# Patient Record
Sex: Female | Born: 1980 | Race: Black or African American | Hispanic: No | Smoking: Current every day smoker
Health system: Southern US, Community
[De-identification: ages and names within clinical notes are randomized; demographics above are authoritative.]

## PROBLEM LIST (undated history)

## (undated) DIAGNOSIS — Z5189 Encounter for other specified aftercare: Secondary | ICD-10-CM

## (undated) DIAGNOSIS — N840 Polyp of corpus uteri: Secondary | ICD-10-CM

## (undated) DIAGNOSIS — D649 Anemia, unspecified: Secondary | ICD-10-CM

## (undated) DIAGNOSIS — K648 Other hemorrhoids: Secondary | ICD-10-CM

## (undated) DIAGNOSIS — R87629 Unspecified abnormal cytological findings in specimens from vagina: Secondary | ICD-10-CM

## (undated) DIAGNOSIS — F329 Major depressive disorder, single episode, unspecified: Secondary | ICD-10-CM

## (undated) DIAGNOSIS — D219 Benign neoplasm of connective and other soft tissue, unspecified: Secondary | ICD-10-CM

## (undated) DIAGNOSIS — N7011 Chronic salpingitis: Secondary | ICD-10-CM

## (undated) DIAGNOSIS — H9319 Tinnitus, unspecified ear: Secondary | ICD-10-CM

## (undated) DIAGNOSIS — B009 Herpesviral infection, unspecified: Secondary | ICD-10-CM

## (undated) DIAGNOSIS — N189 Chronic kidney disease, unspecified: Secondary | ICD-10-CM

## (undated) DIAGNOSIS — N920 Excessive and frequent menstruation with regular cycle: Secondary | ICD-10-CM

## (undated) DIAGNOSIS — R102 Pelvic and perineal pain: Secondary | ICD-10-CM

## (undated) DIAGNOSIS — F32A Depression, unspecified: Secondary | ICD-10-CM

## (undated) DIAGNOSIS — I708 Atherosclerosis of other arteries: Secondary | ICD-10-CM

## (undated) DIAGNOSIS — Z8742 Personal history of other diseases of the female genital tract: Secondary | ICD-10-CM

## (undated) DIAGNOSIS — I7090 Unspecified atherosclerosis: Secondary | ICD-10-CM

## (undated) DIAGNOSIS — R1030 Lower abdominal pain, unspecified: Secondary | ICD-10-CM

## (undated) DIAGNOSIS — N946 Dysmenorrhea, unspecified: Secondary | ICD-10-CM

## (undated) DIAGNOSIS — R195 Other fecal abnormalities: Secondary | ICD-10-CM

## (undated) DIAGNOSIS — R5383 Other fatigue: Secondary | ICD-10-CM

## (undated) DIAGNOSIS — F419 Anxiety disorder, unspecified: Secondary | ICD-10-CM

## (undated) DIAGNOSIS — R7303 Prediabetes: Secondary | ICD-10-CM

## (undated) DIAGNOSIS — K625 Hemorrhage of anus and rectum: Secondary | ICD-10-CM

## (undated) HISTORY — DX: Pelvic and perineal pain: R10.2

## (undated) HISTORY — DX: Excessive and frequent menstruation with regular cycle: N92.0

## (undated) HISTORY — DX: Personal history of other diseases of the female genital tract: Z87.42

## (undated) HISTORY — DX: Herpesviral infection, unspecified: B00.9

## (undated) HISTORY — DX: Major depressive disorder, single episode, unspecified: F32.9

## (undated) HISTORY — DX: Other fecal abnormalities: R19.5

## (undated) HISTORY — DX: Polyp of corpus uteri: N84.0

## (undated) HISTORY — DX: Chronic salpingitis: N70.11

## (undated) HISTORY — DX: Benign neoplasm of connective and other soft tissue, unspecified: D21.9

## (undated) HISTORY — DX: Hemorrhage of anus and rectum: K62.5

## (undated) HISTORY — DX: Unspecified abnormal cytological findings in specimens from vagina: R87.629

## (undated) HISTORY — DX: Lower abdominal pain, unspecified: R10.30

## (undated) HISTORY — DX: Dysmenorrhea, unspecified: N94.6

## (undated) HISTORY — DX: Depression, unspecified: F32.A

## (undated) HISTORY — DX: Other fatigue: R53.83

## (undated) HISTORY — DX: Anxiety disorder, unspecified: F41.9

## (undated) HISTORY — DX: Anemia, unspecified: D64.9

---

## 2003-02-07 HISTORY — PX: ECTOPIC PREGNANCY SURGERY: SHX613

## 2010-11-07 DIAGNOSIS — L280 Lichen simplex chronicus: Secondary | ICD-10-CM

## 2010-11-07 HISTORY — DX: Lichen simplex chronicus: L28.0

## 2011-02-08 DIAGNOSIS — M722 Plantar fascial fibromatosis: Secondary | ICD-10-CM

## 2011-02-08 DIAGNOSIS — L84 Corns and callosities: Secondary | ICD-10-CM | POA: Insufficient documentation

## 2011-02-08 HISTORY — DX: Plantar fascial fibromatosis: M72.2

## 2011-02-08 HISTORY — DX: Corns and callosities: L84

## 2011-03-31 DIAGNOSIS — Z8719 Personal history of other diseases of the digestive system: Secondary | ICD-10-CM | POA: Insufficient documentation

## 2011-03-31 DIAGNOSIS — F1010 Alcohol abuse, uncomplicated: Secondary | ICD-10-CM | POA: Insufficient documentation

## 2011-03-31 HISTORY — DX: Personal history of other diseases of the digestive system: Z87.19

## 2011-05-15 ENCOUNTER — Emergency Department (HOSPITAL_COMMUNITY): Payer: Self-pay

## 2011-05-15 ENCOUNTER — Encounter (HOSPITAL_COMMUNITY): Payer: Self-pay | Admitting: *Deleted

## 2011-05-15 ENCOUNTER — Emergency Department (HOSPITAL_COMMUNITY)
Admission: EM | Admit: 2011-05-15 | Discharge: 2011-05-15 | Disposition: A | Discharge status: Home / Self Care | Payer: Self-pay | Attending: Emergency Medicine | Admitting: Emergency Medicine

## 2011-05-15 DIAGNOSIS — M545 Low back pain, unspecified: Secondary | ICD-10-CM | POA: Insufficient documentation

## 2011-05-15 DIAGNOSIS — R109 Unspecified abdominal pain: Secondary | ICD-10-CM | POA: Insufficient documentation

## 2011-05-15 DIAGNOSIS — F17200 Nicotine dependence, unspecified, uncomplicated: Secondary | ICD-10-CM | POA: Insufficient documentation

## 2011-05-15 DIAGNOSIS — N949 Unspecified condition associated with female genital organs and menstrual cycle: Secondary | ICD-10-CM | POA: Insufficient documentation

## 2011-05-15 DIAGNOSIS — J3489 Other specified disorders of nose and nasal sinuses: Secondary | ICD-10-CM | POA: Insufficient documentation

## 2011-05-15 DIAGNOSIS — M549 Dorsalgia, unspecified: Secondary | ICD-10-CM

## 2011-05-15 DIAGNOSIS — Z79899 Other long term (current) drug therapy: Secondary | ICD-10-CM | POA: Insufficient documentation

## 2011-05-15 DIAGNOSIS — H9209 Otalgia, unspecified ear: Secondary | ICD-10-CM | POA: Insufficient documentation

## 2011-05-15 DIAGNOSIS — R51 Headache: Secondary | ICD-10-CM | POA: Insufficient documentation

## 2011-05-15 DIAGNOSIS — K921 Melena: Secondary | ICD-10-CM | POA: Insufficient documentation

## 2011-05-15 DIAGNOSIS — J338 Other polyp of sinus: Secondary | ICD-10-CM | POA: Insufficient documentation

## 2011-05-15 DIAGNOSIS — K625 Hemorrhage of anus and rectum: Secondary | ICD-10-CM | POA: Insufficient documentation

## 2011-05-15 LAB — BASIC METABOLIC PANEL
BUN: 12 mg/dL (ref 6–23)
CO2: 23 mEq/L (ref 19–32)
Calcium: 9.4 mg/dL (ref 8.4–10.5)
Chloride: 102 mEq/L (ref 96–112)
Creatinine, Ser: 0.65 mg/dL (ref 0.50–1.10)
GFR calc Af Amer: >90 mL/min (ref >90)
GFR calc non Af Amer: >90 mL/min (ref >90)
Glucose, Bld: 115 mg/dL — ABNORMAL HIGH (ref 70–99)
Potassium: 4 mEq/L (ref 3.5–5.1)
Sodium: 135 mEq/L (ref 135–145)

## 2011-05-15 LAB — CBC
HCT: 39.9 % (ref 36.0–46.0)
MCH: 28 pg (ref 26.0–34.0)
MCV: 83.3 fL (ref 78.0–100.0)
Platelets: 301 10*3/uL (ref 150–400)
RDW: 13.1 % (ref 11.5–15.5)

## 2011-05-15 LAB — DIFFERENTIAL
Basophils Absolute: 0 10*3/uL (ref 0.0–0.1)
Eosinophils Absolute: 0.3 10*3/uL (ref 0.0–0.7)
Eosinophils Relative: 5 % (ref 0–5)
Monocytes Absolute: 0.6 10*3/uL (ref 0.1–1.0)

## 2011-05-15 LAB — URINALYSIS, ROUTINE W REFLEX MICROSCOPIC
Bilirubin Urine: NEGATIVE
Hgb urine dipstick: NEGATIVE
Ketones, ur: NEGATIVE mg/dL
Nitrite: NEGATIVE
Protein, ur: NEGATIVE mg/dL
Urobilinogen, UA: 0.2 mg/dL (ref 0.0–1.0)

## 2011-05-15 IMAGING — CT CT ABD-PELV W/ CM
2 of 4 series · 16 of 46 positions shown, 18 images · IV contrast (Omnipaque 300)
Comparison: None.

CLINICAL DATA: Felt pop in right flank, with right flank pain.
Intermittent worsening rectal bleeding.  History of chronic lower
back and bilateral hip pain.

CT ABDOMEN AND PELVIS WITH CONTRAST
TECHNIQUE: Multidetector CT imaging of the abdomen and pelvis was
performed following the standard protocol during bolus
administration of intravenous contrast.
Contrast: 100mL OMNIPAQUE IOHEXOL 300 MG/ML  SOLN

[Series 2: abd_pel_with 5.0 b40f · axial · 0.79mm/px · z∈[-479,-79]mm · 13 of 88 slices shown, 15 images]
[im 4/88  soft-tissue]
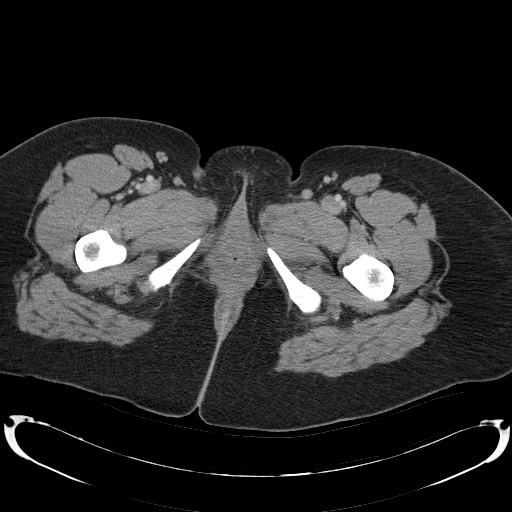
[im 4/88  bone]
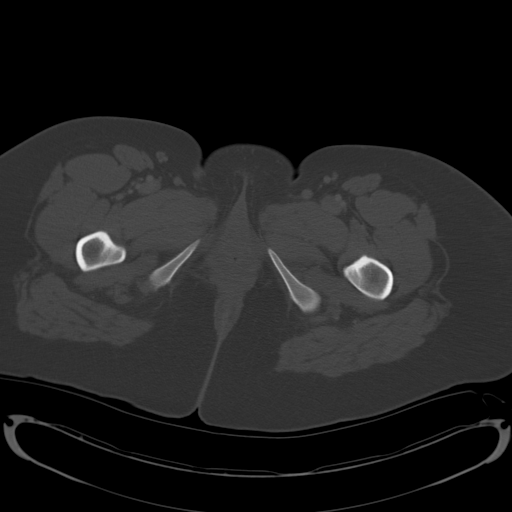
[im 11/88  soft-tissue]
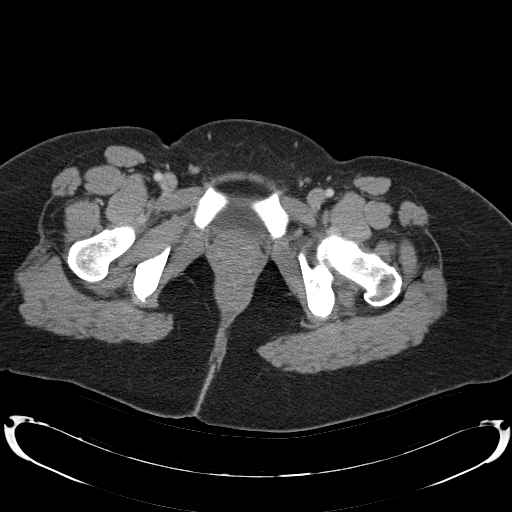
[im 19/88  soft-tissue]
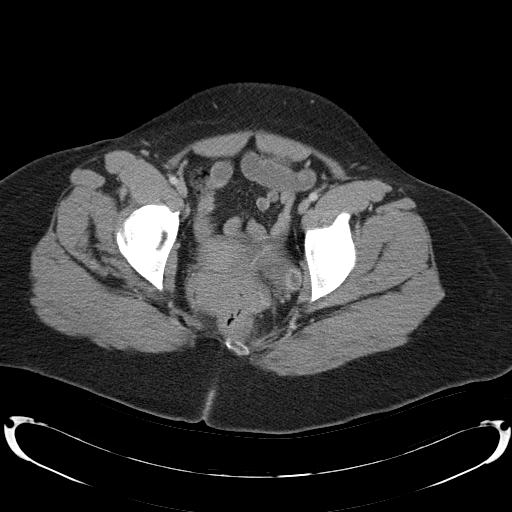
[im 26/88  soft-tissue]
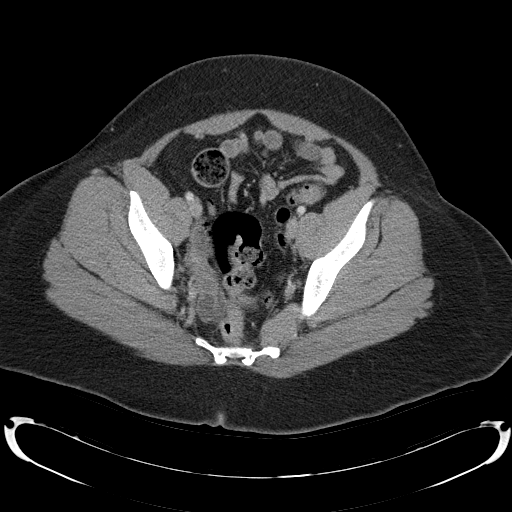
[im 30/88  soft-tissue]
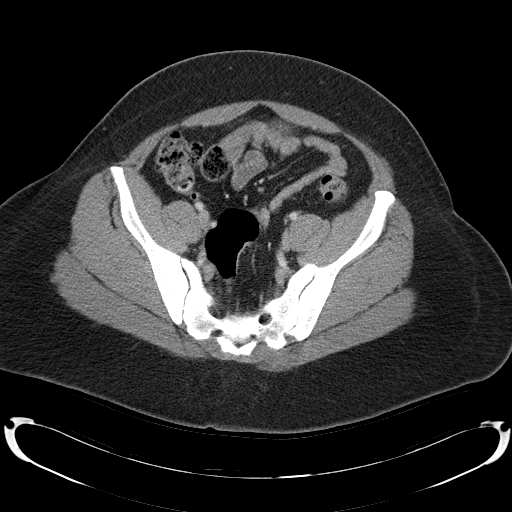
[im 37/88  soft-tissue]
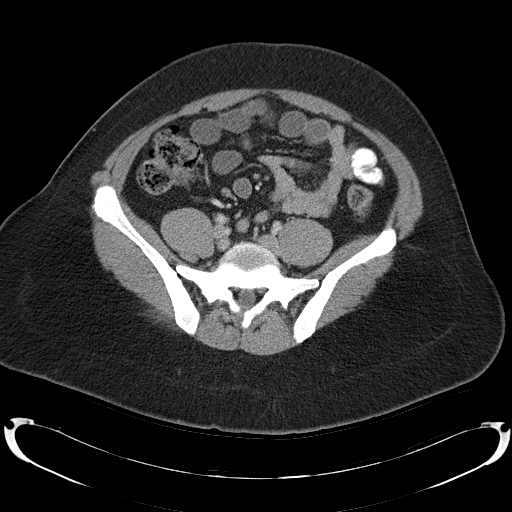
[im 44/88  soft-tissue]
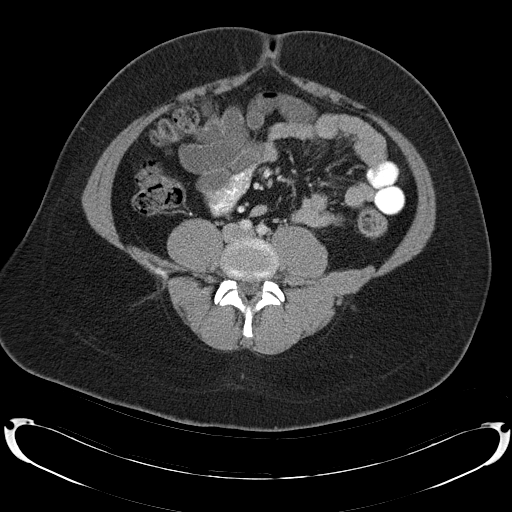
[im 51/88  soft-tissue]
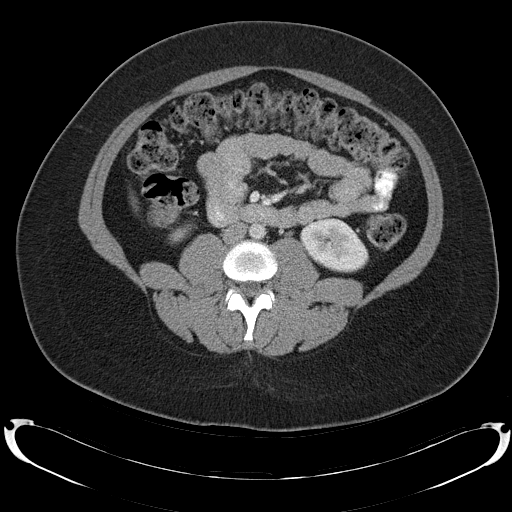
[im 59/88  soft-tissue]
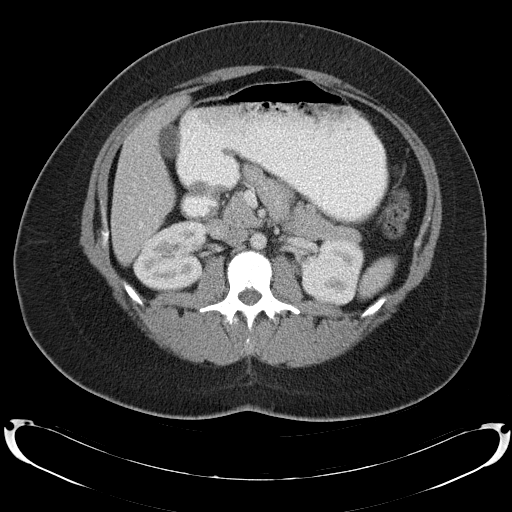
[im 59/88  bone]
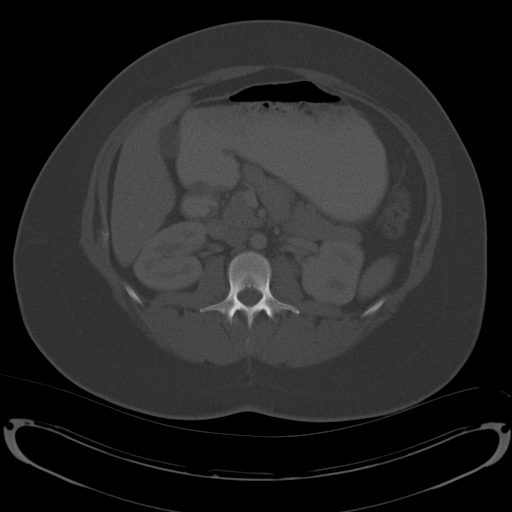
[im 62/88  soft-tissue]
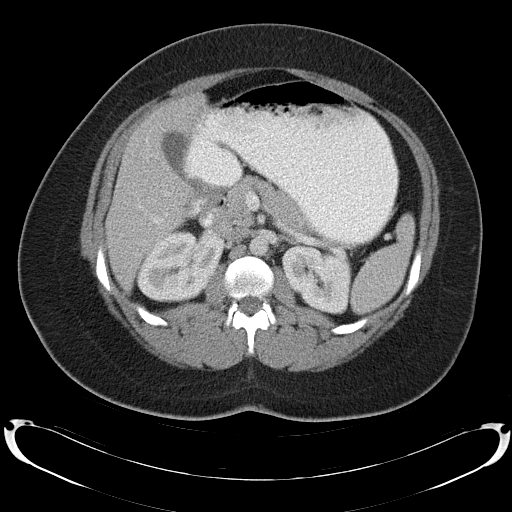
[im 69/88  soft-tissue]
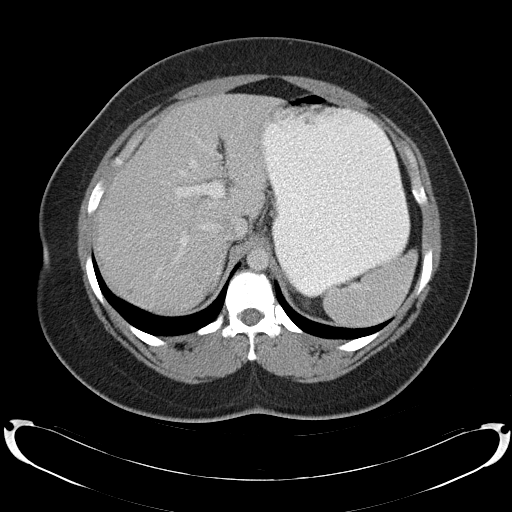
[im 77/88  soft-tissue]
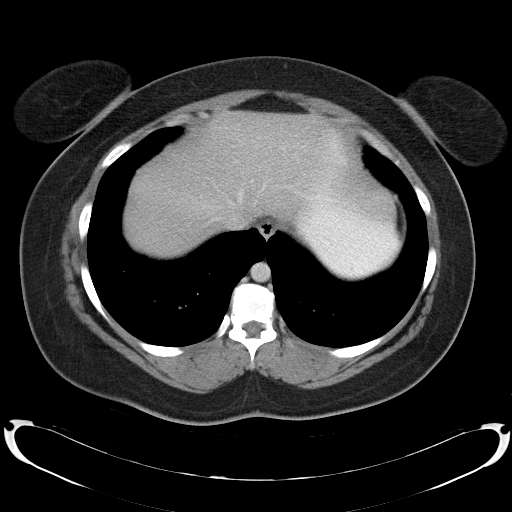
[im 84/88  soft-tissue]
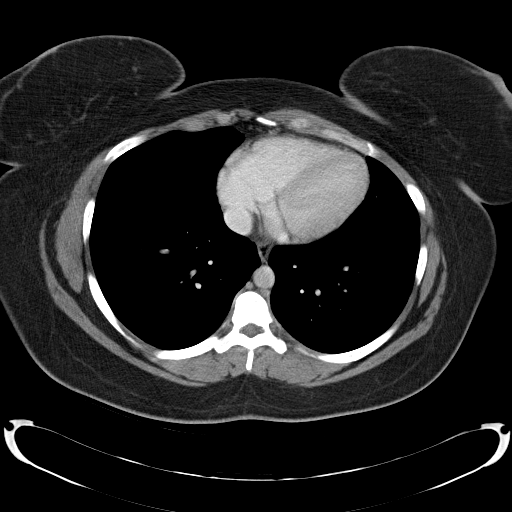

[Series 4: abd_pel_with 3.0 spo cor · coronal · 0.71mm/px · 3 of 92 slices shown]
[im 31/92  soft-tissue]
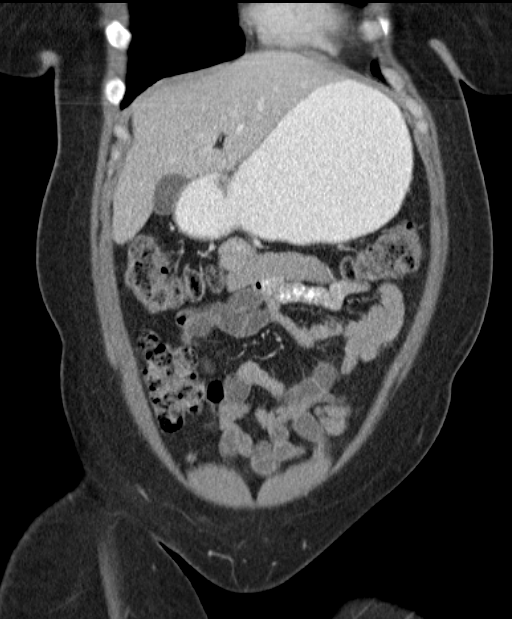
[im 41/92  soft-tissue]
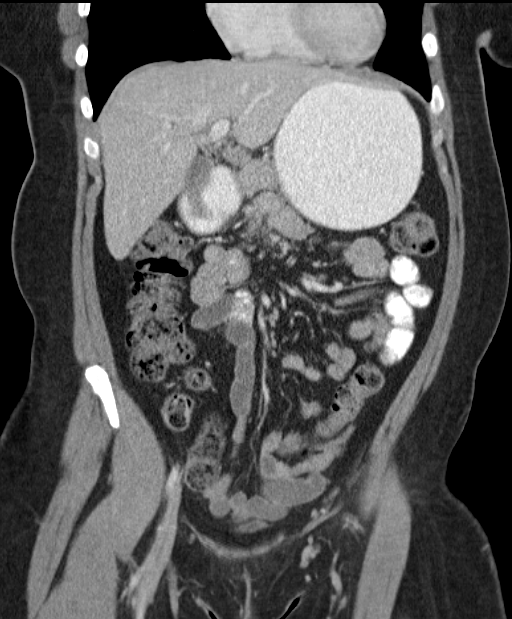
[im 51/92  soft-tissue]
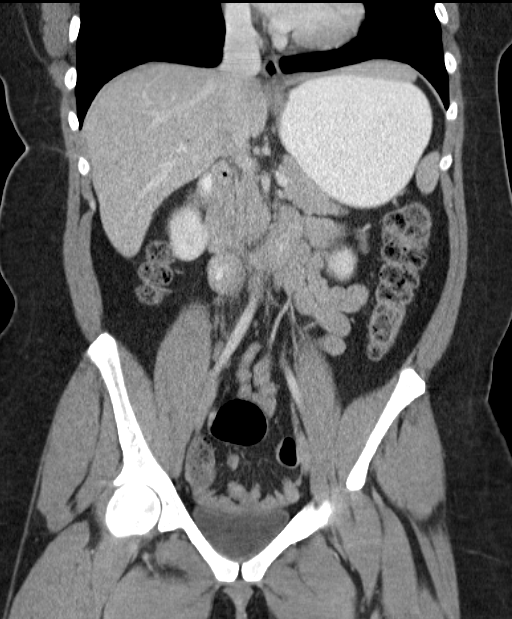

[16 of 46 positions shown; findings below may reference images not displayed]

FINDINGS: The visualized lung bases are clear.

The liver and spleen are unremarkable in appearance.  The
gallbladder is within normal limits.  The pancreas and adrenal
glands are unremarkable.

The kidneys are unremarkable in appearance.  There is no evidence
of hydronephrosis.  No renal or ureteral stones are seen.  No
perinephric stranding is appreciated.

No free fluid is identified.  The small bowel is unremarkable in
appearance.  The stomach is filled with contrast and is within
normal limits.  No acute vascular abnormalities are seen.

The appendix is normal in caliber and contains air, without
evidence for appendicitis.  The colon is partially filled with
stool and is unremarkable in appearance.

The bladder is mildly distended and grossly unremarkable in
appearance.  The uterus is grossly unremarkable in appearance; the
ovaries are relatively symmetric.  No suspicious adnexal masses are
seen.  No inguinal lymphadenopathy is seen.

No acute osseous abnormalities are identified.
IMPRESSION: 1.  Unremarkable CT of the abdomen and pelvis.
2.  The hip joints are grossly unremarkable in appearance; no
evidence of fracture or subluxation along the lumbar spine.  No
significant disc protrusions identified; the bony foramina appear
grossly intact bilaterally.  Note that soft tissue abnormalities,
such as labral pathology, are not well assessed on CT.

## 2011-05-15 IMAGING — CT CT HEAD W/O CM
1 series · 15 of 30 positions shown, 19 images · non-contrast
Comparison: None.

CLINICAL DATA: Sinus and head congestion.

CT HEAD WITHOUT CONTRAST
TECHNIQUE: Contiguous axial images were obtained from the base of
the skull through the vertex without contrast.

[Series 2: headseq 4.8 h37s · axial · 0.44mm/px · z∈[+173,+308]mm · 15 of 30 slices shown, 19 images]
[im 2/30  brain]
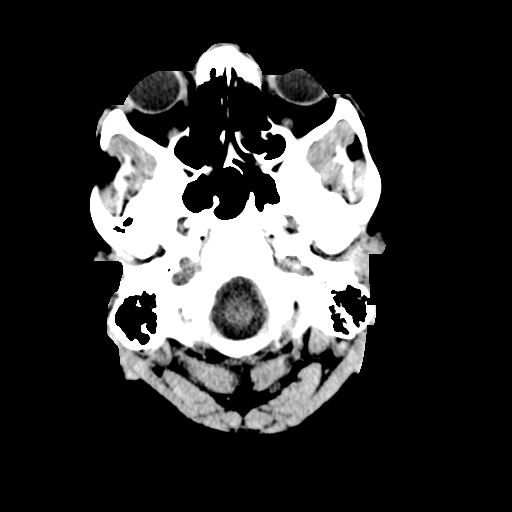
[im 2/30  bone]
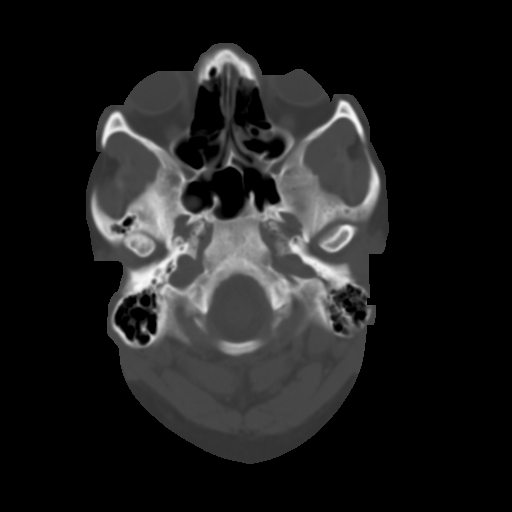
[im 4/30  brain]
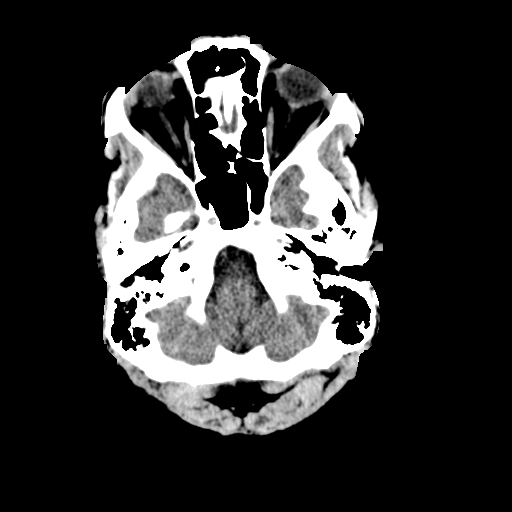
[im 6/30  brain]
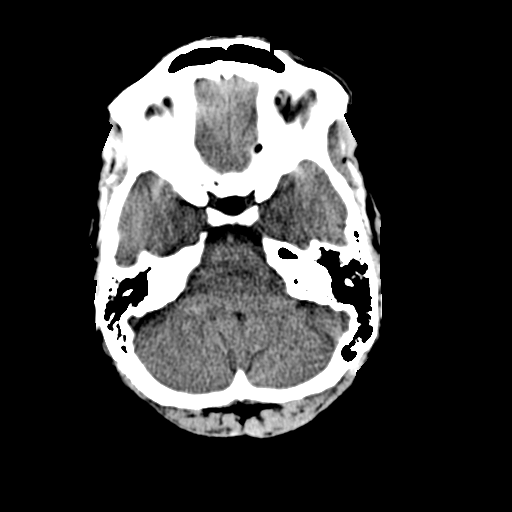
[im 8/30  brain]
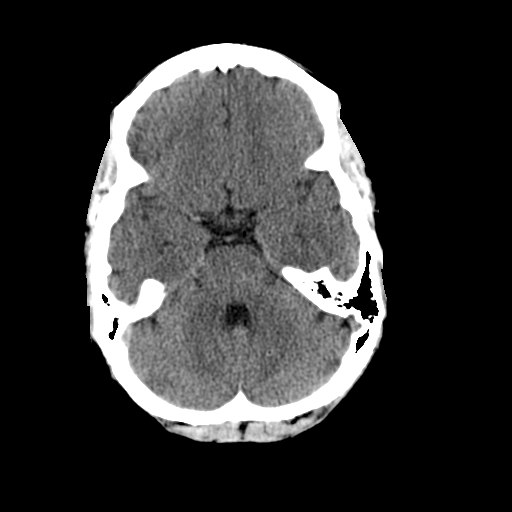
[im 10/30  brain]
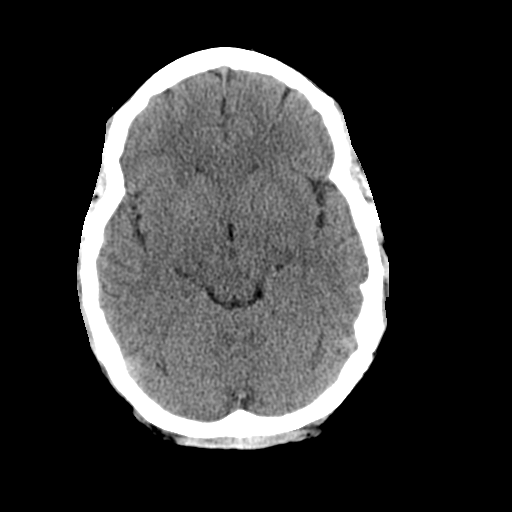
[im 10/30  bone]
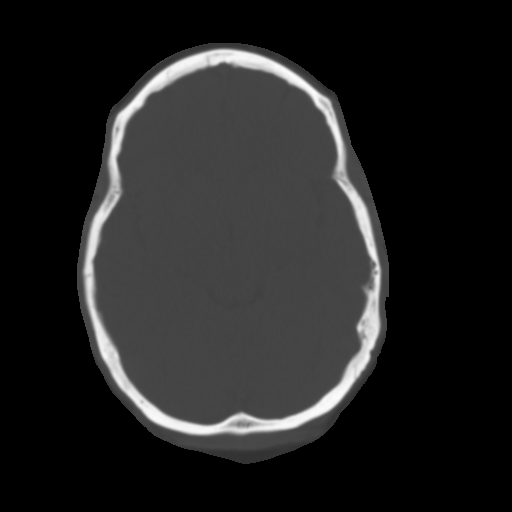
[im 12/30  brain]
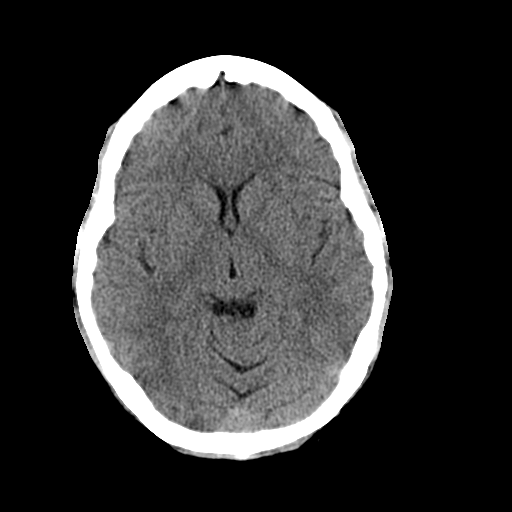
[im 14/30  brain]
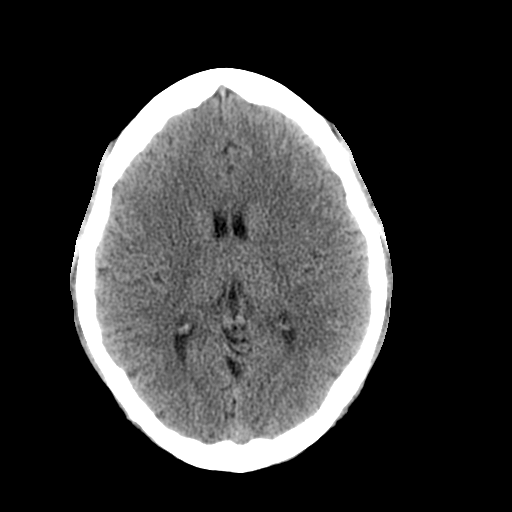
[im 16/30  brain]
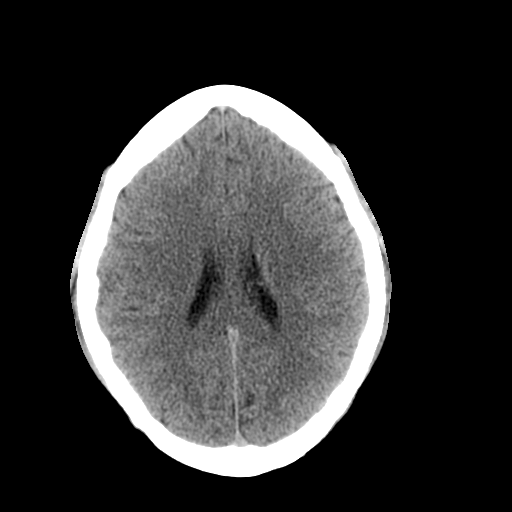
[im 17/30  brain]
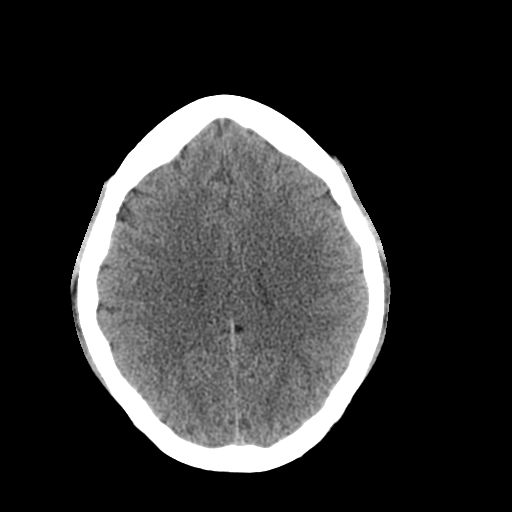
[im 17/30  bone]
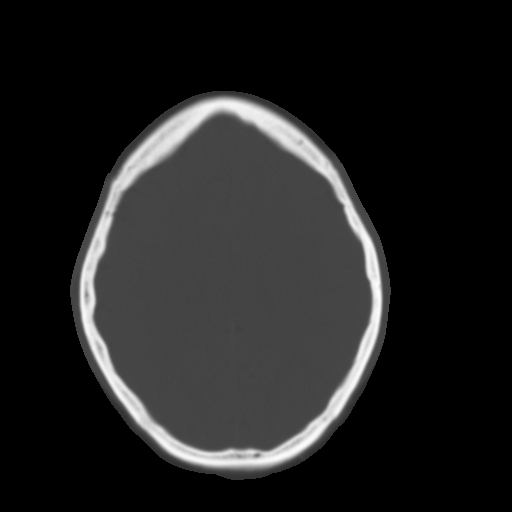
[im 19/30  brain]
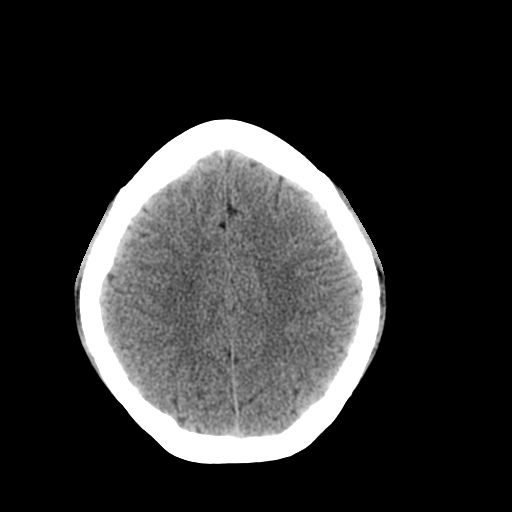
[im 21/30  brain]
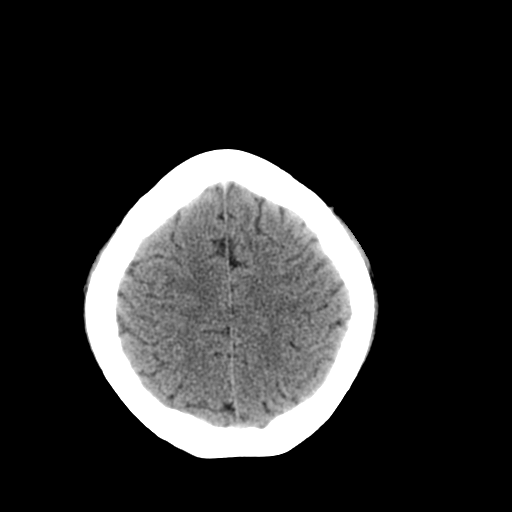
[im 23/30  brain]
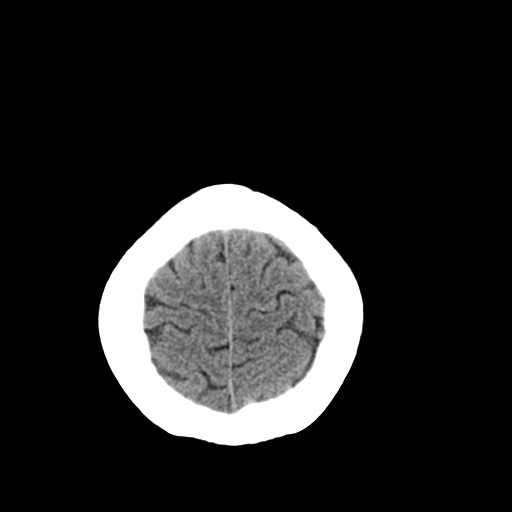
[im 25/30  brain]
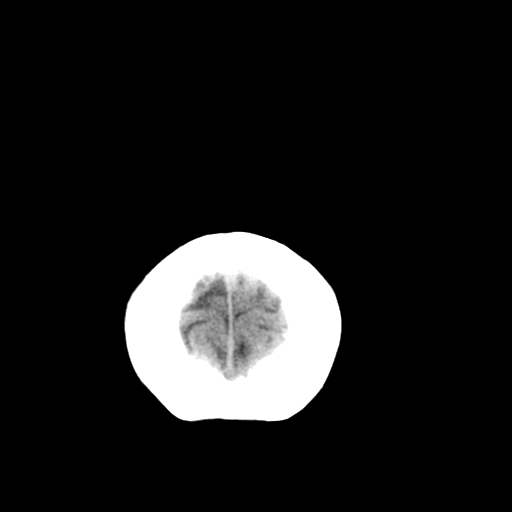
[im 25/30  bone]
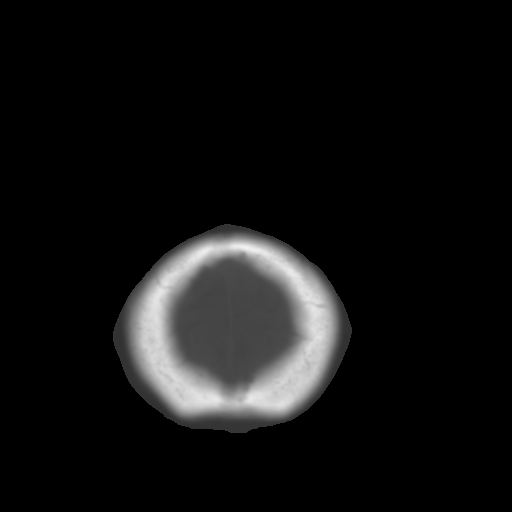
[im 27/30  brain]
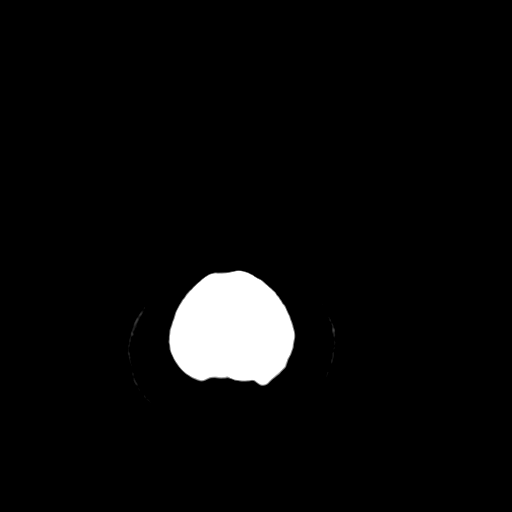
[im 29/30  brain]
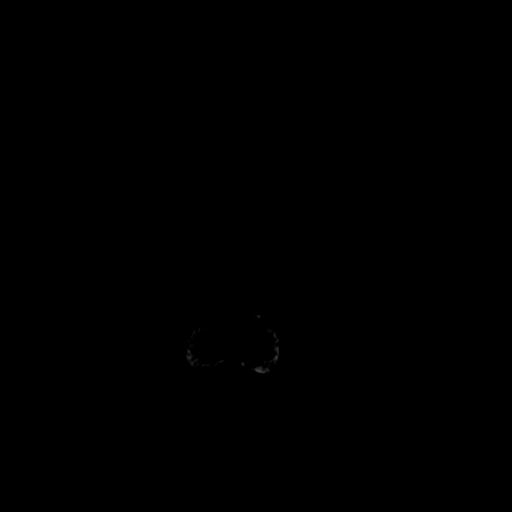

[15 of 30 positions shown; findings below may reference images not displayed]

FINDINGS: There is no evidence of acute infarction, mass lesion, or
intra- or extra-axial hemorrhage on CT.

The posterior fossa, including the cerebellum, brainstem and fourth
ventricle, is within normal limits.  The third and lateral
ventricles, and basal ganglia are unremarkable in appearance.  The
cerebral hemispheres are symmetric in appearance, with normal gray-
white differentiation.  No mass effect or midline shift is seen.

There is no evidence of fracture; visualized osseous structures are
unremarkable in appearance.  The visualized portions of the orbits
are within normal limits.  Two small polyps or mucus retention
cysts are noted within the right side of the sphenoid sinus; the
remaining paranasal sinuses and mastoid air cells are well-aerated.
No significant soft tissue abnormalities are seen.
IMPRESSION: 1.  No acute intracranial pathology seen on CT.
2.  Two small polyps or mucus retention cysts within the right side
of the sphenoid sinus; paranasal sinuses and mastoid air cells
otherwise well-aerated.

## 2011-05-15 MED ORDER — HYDROCODONE-ACETAMINOPHEN 5-325 MG PO TABS
1.0000 | ORAL_TABLET | Freq: Once | ORAL | Status: AC
  Administered 2011-05-15: 1 via ORAL
  Filled 2011-05-15: qty 1

## 2011-05-15 MED ORDER — HYDROCODONE-ACETAMINOPHEN 5-325 MG PO TABS
1.0000 | ORAL_TABLET | Freq: Four times a day (QID) | ORAL | Status: AC | PRN
Start: 2011-05-15 — End: 2011-05-25

## 2011-05-15 MED ORDER — ONDANSETRON HCL 4 MG/2ML IJ SOLN
4.0000 mg | Freq: Once | INTRAMUSCULAR | Status: AC
  Administered 2011-05-15: 4 mg via INTRAVENOUS
  Filled 2011-05-15: qty 2

## 2011-05-15 MED ORDER — SODIUM CHLORIDE 0.9 % IV BOLUS (SEPSIS)
250.0000 mL | Freq: Once | INTRAVENOUS | Status: AC
  Administered 2011-05-15: 1000 mL via INTRAVENOUS

## 2011-05-15 MED ORDER — NAPROXEN 500 MG PO TABS: 500.0000 mg | ORAL_TABLET | Freq: Two times a day (BID) | ORAL | Status: DC

## 2011-05-15 MED ORDER — IOHEXOL 300 MG/ML  SOLN
100.0000 mL | Freq: Once | INTRAMUSCULAR | Status: AC | PRN
  Administered 2011-05-15: 100 mL via INTRAVENOUS

## 2011-05-15 MED ORDER — HYDROMORPHONE HCL PF 1 MG/ML IJ SOLN
1.0000 mg | Freq: Once | INTRAMUSCULAR | Status: AC
  Administered 2011-05-15: 1 mg via INTRAVENOUS
  Filled 2011-05-15: qty 1

## 2011-05-15 MED ORDER — SODIUM CHLORIDE 0.9 % IV SOLN
INTRAVENOUS | Status: DC
  Administered 2011-05-15: 21:00:00 via INTRAVENOUS

## 2011-05-15 NOTE — ED Provider Notes (Signed)
History     CSN: [id number]  Arrival date & time 05/15/11  1442   First MD Initiated Contact with Patient 05/15/11 1941      Chief Complaint  Patient presents with  . Flank Pain    (Consider location/radiation/quality/duration/timing/severity/associated sxs/prior treatment) The history is provided by the patient.   patient is a 31 year old female recently moved to the area presenting for a popping feeling in her right flank low back pain pelvic pain or low back pain in the pelvic pain is been present for several weeks to months followed by GYN in [facility] was supposed to have an operation for a scarred fallopian tube Dr. pelvic wall. However she is now moved here and they told her they would do that anymore not clear why that is the way it is. Patient also with a complaint of feeling of congestion worried about having a sinus infection some fullness in her left ear. And history of rectal bleeding on and off bright red blood with bowel movements for several months worse in the last few months. Patient denies any chest pain dizziness lightheadedness nausea vomiting or diarrhea or rash. Patient has followup with health department here [facility] later this week.  History reviewed. No pertinent past medical history.  Past Surgical History  Procedure Date  . Ectopic pregnancy surgery     History reviewed. No pertinent family history.  History  Substance Use Topics  . Smoking status: Current Everyday Smoker  . Smokeless tobacco: Not on file  . Alcohol Use: Yes    OB History    Grav Para Term Preterm Abortions TAB SAB Ect Mult Living                  Review of Systems  Constitutional: Negative for fever, chills and fatigue.  HENT: Positive for ear pain, congestion and sinus pressure. Negative for nosebleeds, sore throat, neck pain, tinnitus and ear discharge.   Respiratory: Negative for cough, chest tightness and shortness of breath.   Cardiovascular: Negative for  chest pain and leg swelling.  Gastrointestinal: Positive for abdominal pain, blood in stool and anal bleeding. Negative for nausea, vomiting, diarrhea and rectal pain.  Genitourinary: Negative for dysuria and vaginal discharge.  Musculoskeletal: Positive for back pain. Negative for myalgias.  Skin: Negative for rash.  Neurological: Positive for headaches. Negative for dizziness, syncope, facial asymmetry, speech difficulty and weakness.  Hematological: Does not bruise/bleed easily.    Allergies  Review of patient's allergies indicates no known allergies.  Home Medications   Current Outpatient Rx  Name Route Sig Dispense Refill  . IBUPROFEN 200 MG PO TABS Oral Take 400 mg by mouth once as needed. For pain    . VALACYCLOVIR HCL 1 G PO TABS Oral Take 1,000 mg by mouth daily.    Marland Kitchen HYDROCODONE-ACETAMINOPHEN 5-325 MG PO TABS Oral Take 1-2 tablets by mouth every 6 (six) hours as needed for pain. 10 tablet 0  . NAPROXEN 500 MG PO TABS Oral Take 1 tablet (500 mg total) by mouth 2 (two) times daily. 14 tablet 0    BP 107/51  Pulse 64  Temp(Src) 98.6 F (37 C) (Oral)  Resp 20  Ht 5\' 4"  (1.626 m)  Wt 200 lb (90.719 kg)  BMI 34.33 kg/m2  SpO2 100%  LMP 05/01/2011  Physical Exam  Nursing note and vitals reviewed. Constitutional: She is oriented to person, place, and time. She appears well-developed and well-nourished. No distress.  HENT:  Head: Normocephalic and atraumatic.  Right Ear: External ear normal.  Left Ear: External ear normal.  Mouth/Throat: Oropharynx is clear and moist. No oropharyngeal exudate.  Eyes: Conjunctivae and EOM are normal. Pupils are equal, round, and reactive to light.  Neck: Normal range of motion. Neck supple.  Cardiovascular: Normal rate, regular rhythm, normal heart sounds and intact distal pulses.   No murmur heard. Pulmonary/Chest: Effort normal and breath sounds normal. She has no wheezes. She has no rales. She exhibits no tenderness.  Abdominal: Soft.  She exhibits no mass. There is no tenderness.  Musculoskeletal: Normal range of motion. She exhibits no edema and no tenderness.       Bilateral lower extremity pulses dorsalis pedis 3+ or greater. Posterior tib 2+. Motor strength in the feet normal sensory intact. No pain with range of motion of the hip area.  Lymphadenopathy:    She has no cervical adenopathy.  Neurological: She is alert and oriented to person, place, and time. No cranial nerve deficit. She exhibits normal muscle tone. Coordination normal.  Skin: No rash noted.    ED Course  Procedures (including critical care time)  Labs Reviewed  URINALYSIS, ROUTINE W REFLEX MICROSCOPIC - Abnormal; Notable for the following:    Specific Gravity, Urine >1.030 (*)    All other components within normal limits  DIFFERENTIAL - Abnormal; Notable for the following:    Neutrophils Relative 37 (*)    Lymphocytes Relative 49 (*)    All other components within normal limits  BASIC METABOLIC PANEL - Abnormal; Notable for the following:    Glucose, Bld 115 (*)    All other components within normal limits  PREGNANCY, URINE  CBC   Ct Head Wo Contrast  05/15/2011  *RADIOLOGY REPORT*  Clinical Data: Sinus and head congestion.  CT HEAD WITHOUT CONTRAST  Technique:  Contiguous axial images were obtained from the base of the skull through the vertex without contrast.  Comparison: None.  Findings: There is no evidence of acute infarction, mass lesion, or intra- or extra-axial hemorrhage on CT.  The posterior fossa, including the cerebellum, brainstem and fourth ventricle, is within normal limits.  The third and lateral ventricles, and basal ganglia are unremarkable in appearance.  The cerebral hemispheres are symmetric in appearance, with normal gray- white differentiation.  No mass effect or midline shift is seen.  There is no evidence of fracture; visualized osseous structures are unremarkable in appearance.  The visualized portions of the orbits are  within normal limits.  Two small polyps or mucus retention cysts are noted within the right side of the sphenoid sinus; the remaining paranasal sinuses and mastoid air cells are well-aerated. No significant soft tissue abnormalities are seen.  IMPRESSION:  1.  No acute intracranial pathology seen on CT. 2.  Two small polyps or mucus retention cysts within the right side of the sphenoid sinus; paranasal sinuses and mastoid air cells otherwise well-aerated.  Original Report Authenticated By: Tonia Ghent, M.D.   Ct Abdomen Pelvis W Contrast  05/15/2011  *RADIOLOGY REPORT*  Clinical Data: Felt pop in right flank, with right flank pain. Intermittent worsening rectal bleeding.  History of chronic lower back and bilateral hip pain.  CT ABDOMEN AND PELVIS WITH CONTRAST  Technique:  Multidetector CT imaging of the abdomen and pelvis was performed following the standard protocol during bolus administration of intravenous contrast.  Contrast: OMNIPAQUE IOHEXOL 300 MG/ML  SOLN  Comparison: None.  Findings: The visualized lung bases are clear.  The liver and spleen are unremarkable in  appearance.  The gallbladder is within normal limits.  The pancreas and adrenal glands are unremarkable.  The kidneys are unremarkable in appearance.  There is no evidence of hydronephrosis.  No renal or ureteral stones are seen.  No perinephric stranding is appreciated.  No free fluid is identified.  The small bowel is unremarkable in appearance.  The stomach is filled with contrast and is within normal limits.  No acute vascular abnormalities are seen.  The appendix is normal in caliber and contains air, without evidence for appendicitis.  The colon is partially filled with stool and is unremarkable in appearance.  The bladder is mildly distended and grossly unremarkable in appearance.  The uterus is grossly unremarkable in appearance; the ovaries are relatively symmetric.  No suspicious adnexal masses are seen.  No inguinal  lymphadenopathy is seen.  No acute osseous abnormalities are identified.  IMPRESSION:  1.  Unremarkable CT of the abdomen and pelvis. 2.  The hip joints are grossly unremarkable in appearance; no evidence of fracture or subluxation along the lumbar spine.  No significant disc protrusions identified; the bony foramina appear grossly intact bilaterally.  Note that soft tissue abnormalities, such as labral pathology, are not well assessed on CT.  Original Report Authenticated By: Tonia Ghent, M.D.   Results for orders placed during the hospital encounter of 05/15/11  URINALYSIS, ROUTINE W REFLEX MICROSCOPIC      Component Value Range   Color, Urine YELLOW  YELLOW    APPearance CLEAR  CLEAR    Specific Gravity, Urine >1.030 (*) 1.005 - 1.030    pH 5.5  5.0 - 8.0    Glucose, UA NEGATIVE  NEGATIVE (mg/dL)   Hgb urine dipstick NEGATIVE  NEGATIVE    Bilirubin Urine NEGATIVE  NEGATIVE    Ketones, ur NEGATIVE  NEGATIVE (mg/dL)   Protein, ur NEGATIVE  NEGATIVE (mg/dL)   Urobilinogen, UA 0.2  0.0 - 1.0 (mg/dL)   Nitrite NEGATIVE  NEGATIVE    Leukocytes, UA NEGATIVE  NEGATIVE   PREGNANCY, URINE      Component Value Range   Preg Test, Ur NEGATIVE  NEGATIVE   CBC      Component Value Range   WBC 6.4  4.0 - 10.5 (K/uL)   RBC 4.79  3.87 - 5.11 (MIL/uL)   Hemoglobin 13.4  12.0 - 15.0 (g/dL)   HCT 11.9  14.7 - 82.9 (%)   MCV 83.3  78.0 - 100.0 (fL)   MCH 28.0  26.0 - 34.0 (pg)   MCHC 33.6  30.0 - 36.0 (g/dL)   RDW 56.2  13.0 - 86.5 (%)   Platelets 301  150 - 400 (K/uL)  DIFFERENTIAL      Component Value Range   Neutrophils Relative 37 (*) 43 - 77 (%)   Neutro Abs 2.4  1.7 - 7.7 (K/uL)   Lymphocytes Relative 49 (*) 12 - 46 (%)   Lymphs Abs 3.2  0.7 - 4.0 (K/uL)   Monocytes Relative 9  3 - 12 (%)   Monocytes Absolute 0.6  0.1 - 1.0 (K/uL)   Eosinophils Relative 5  0 - 5 (%)   Eosinophils Absolute 0.3  0.0 - 0.7 (K/uL)   Basophils Relative 1  0 - 1 (%)   Basophils Absolute 0.0  0.0 - 0.1 (K/uL)   BASIC METABOLIC PANEL      Component Value Range   Sodium 135  135 - 145 (mEq/L)   Potassium 4.0  3.5 - 5.1 (mEq/L)   Chloride  102  96 - 112 (mEq/L)   CO2 23  19 - 32 (mEq/L)   Glucose, Bld 115 (*) 70 - 99 (mg/dL)   BUN 12  6 - 23 (mg/dL)   Creatinine, Ser 7.82  0.50 - 1.10 (mg/dL)   Calcium 9.4  8.4 - 95.6 (mg/dL)   GFR calc non Af Amer >90  >90 (mL/min)   GFR calc Af Amer >90  >90 (mL/min)     1. Abdominal pain   2. Back pain   3. Multiple polyps of ethmoid sinus   4. Rectal bleeding       MDM   Patient with multiple complaints main complaints and then the headache sinus infection concern. Pelvic abdominal pain has been present for several weeks to months. Rectal bleeding has been ongoing for several months but worse lately. Workup in the emergency department shows head CT showing some sinus polyps no evidence of acute sinusitis. CT of abdomen and pelvis without any significant abnormalities also no hip or low back abnormalities. Also uterus and adnexa normal on the CT. Rectal bleeding H&H is normal. Patient urinalysis is negative pregnancy test is negative. Electrolytes without significant findings. Patient will be given referrals to GYN GI medicine and ear nose and throat. Patient also has followup later this week with the health department. Patient did state that she was supposed to have surgery done by GYN in [facility] she may want to recheck about having that done before she starts all over with Dr. Emelda Fear. Surgery was supposed to be for pelvic pain and some abnormality with her right fallopian tube however that is not evident on today's studies. The etiology of the pop that she felt in the right flank is not clear but may be musculoskeletal but certainly no significant abnormalities found on CT.  Patient is nontoxic in no acute distress.       Shelda Jakes, MD 05/15/11 [phone]

## 2011-05-15 NOTE — ED Notes (Signed)
CT contrast given to patient to drink.

## 2011-05-15 NOTE — ED Notes (Signed)
Rt flank pain, felt a "pop " in rt flank ,  Rectal bleeding for years, but worse for last few mos.

## 2011-05-15 NOTE — ED Notes (Signed)
Patient finished with CT contrast

## 2011-05-15 NOTE — Discharge Instructions (Signed)
Return for any new worse symptoms. Make appointments to follow up with ear nose and throat for the sinus polyps. Make an appointment to followup with OB/GYN for the past pelvic concerns. Make appointment with GI medicine for the rectal bleeding. Followup with health department is scheduled for later this week. Take pain medicine as directed.

## 2011-06-14 ENCOUNTER — Other Ambulatory Visit: Payer: Self-pay

## 2011-06-14 ENCOUNTER — Encounter (HOSPITAL_COMMUNITY): Payer: Self-pay | Admitting: *Deleted

## 2011-06-14 ENCOUNTER — Emergency Department (HOSPITAL_COMMUNITY): Payer: Self-pay

## 2011-06-14 ENCOUNTER — Emergency Department (HOSPITAL_COMMUNITY)
Admission: EM | Admit: 2011-06-14 | Discharge: 2011-06-14 | Disposition: A | Discharge status: Home / Self Care | Payer: Self-pay | Attending: Emergency Medicine | Admitting: Emergency Medicine

## 2011-06-14 DIAGNOSIS — R209 Unspecified disturbances of skin sensation: Secondary | ICD-10-CM | POA: Insufficient documentation

## 2011-06-14 DIAGNOSIS — M940 Chondrocostal junction syndrome [Tietze]: Secondary | ICD-10-CM | POA: Insufficient documentation

## 2011-06-14 DIAGNOSIS — G5603 Carpal tunnel syndrome, bilateral upper limbs: Secondary | ICD-10-CM

## 2011-06-14 DIAGNOSIS — G5600 Carpal tunnel syndrome, unspecified upper limb: Secondary | ICD-10-CM | POA: Insufficient documentation

## 2011-06-14 DIAGNOSIS — F17200 Nicotine dependence, unspecified, uncomplicated: Secondary | ICD-10-CM | POA: Insufficient documentation

## 2011-06-14 DIAGNOSIS — R0789 Other chest pain: Secondary | ICD-10-CM

## 2011-06-14 DIAGNOSIS — R079 Chest pain, unspecified: Secondary | ICD-10-CM | POA: Insufficient documentation

## 2011-06-14 LAB — CBC
MCH: 27.8 pg (ref 26.0–34.0)
MCV: 84.1 fL (ref 78.0–100.0)
Platelets: 278 10*3/uL (ref 150–400)
RBC: 4.28 MIL/uL (ref 3.87–5.11)
RDW: 13.5 % (ref 11.5–15.5)

## 2011-06-14 LAB — COMPREHENSIVE METABOLIC PANEL
BUN: 11 mg/dL (ref 6–23)
CO2: 24 mEq/L (ref 19–32)
Chloride: 104 mEq/L (ref 96–112)
Creatinine, Ser: 0.67 mg/dL (ref 0.50–1.10)
GFR calc non Af Amer: >90 mL/min (ref >90)
Glucose, Bld: 96 mg/dL (ref 70–99)
Total Bilirubin: 0.3 mg/dL (ref 0.3–1.2)

## 2011-06-14 LAB — DIFFERENTIAL
Basophils Relative: 0 % (ref 0–1)
Eosinophils Absolute: 0.3 10*3/uL (ref 0.0–0.7)
Eosinophils Relative: 7 % — ABNORMAL HIGH (ref 0–5)
Lymphs Abs: 1.8 10*3/uL (ref 0.7–4.0)
Neutrophils Relative %: 51 % (ref 43–77)

## 2011-06-14 IMAGING — CR DG CHEST 1V PORT
1 series · 1 of 1 positions shown · non-contrast
Comparison: None.

CLINICAL DATA: Chest pain.

PORTABLE CHEST - 1 VIEW

[view not recorded]
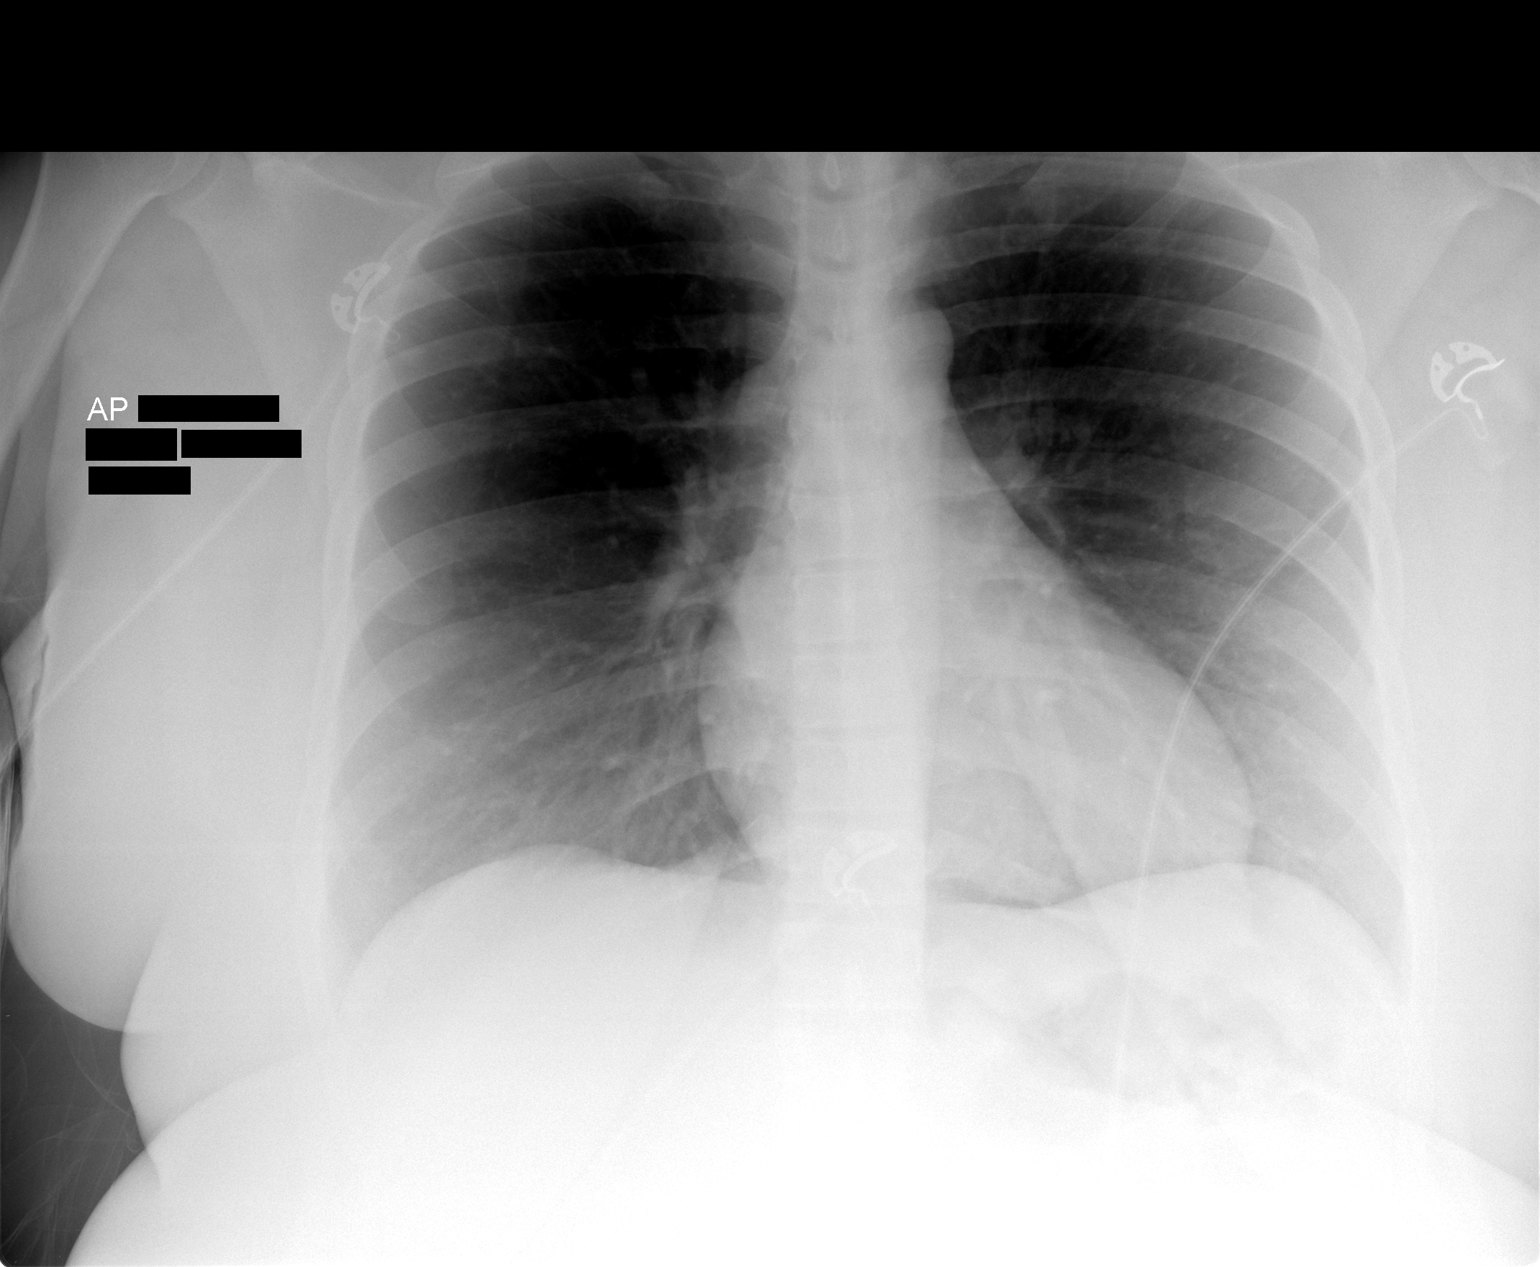

[1 of 1 positions shown; findings below may reference images not displayed]

FINDINGS: Heart and mediastinal contours are within normal limits.
No focal opacities or effusions.  No acute bony abnormality.
IMPRESSION: No active cardiopulmonary disease.

## 2011-06-14 MED ORDER — SODIUM CHLORIDE 0.9 % IV SOLN: INTRAVENOUS | Status: DC

## 2011-06-14 MED ORDER — KETOROLAC TROMETHAMINE 30 MG/ML IJ SOLN
30.0000 mg | Freq: Once | INTRAMUSCULAR | Status: AC
  Administered 2011-06-14: 30 mg via INTRAVENOUS
  Filled 2011-06-14: qty 1

## 2011-06-14 MED ORDER — FAMOTIDINE IN NACL 20-0.9 MG/50ML-% IV SOLN
20.0000 mg | Freq: Once | INTRAVENOUS | Status: AC
  Administered 2011-06-14: 20 mg via INTRAVENOUS
  Filled 2011-06-14: qty 50

## 2011-06-14 MED ORDER — CYCLOBENZAPRINE HCL 10 MG PO TABS
10.0000 mg | ORAL_TABLET | Freq: Three times a day (TID) | ORAL | Status: DC | PRN
Start: 2011-06-14 — End: 2011-06-14

## 2011-06-14 MED ORDER — SODIUM CHLORIDE 0.9 % IV BOLUS (SEPSIS)
1000.0000 mL | Freq: Once | INTRAVENOUS | Status: AC
  Administered 2011-06-14: 1000 mL via INTRAVENOUS

## 2011-06-14 MED ORDER — GI COCKTAIL ~~LOC~~
30.0000 mL | Freq: Once | ORAL | Status: AC
  Administered 2011-06-14: 30 mL via ORAL
  Filled 2011-06-14: qty 30

## 2011-06-14 MED ORDER — CYCLOBENZAPRINE HCL 5 MG PO TABS: 5.0000 mg | ORAL_TABLET | Freq: Three times a day (TID) | ORAL | Status: AC | PRN

## 2011-06-14 NOTE — ED Notes (Signed)
Dr.Knapp at bedside  

## 2011-06-14 NOTE — ED Notes (Signed)
Bilateral wrist splints applied per order. Pt instructed to wear them at night per Dr. Lynelle Doctor.

## 2011-06-14 NOTE — ED Notes (Signed)
Pt states she has been waking up the past few mornings with tingling in her hands and arms. States this morning she woke up with the same plus Chest pain.

## 2011-06-14 NOTE — Discharge Instructions (Signed)
Your chest x-ray and EKG today are normal and your blood test for heart attack is negative. Your having chest wall pain most likely from your stress and anxiety. Try soaking in a hot shower which will help relieve the soreness in the muscles in your chest. Increase your ibuprofen to 600 mg 4 times a day with food and you can take it with the Flexeril ($4 at [facility]). Ibuprofen can be irritating to your stomach, you also need to take Pepcid or Prilosec over-the-counter twice a day for the next couple weeks and then back down to once a day. You can call Dr. Romeo Apple, the orthopedist on call, to get further evaluated for carpal tunnel syndrome in your wrist which can cause the numbness and pain that wakes you up at night. Keep your appointments with your doctors in [place] to get your abdominal surgery done for your chronic pelvic pain. Recheck if you get fever, cough up yellow or green sputum or you seem worse

## 2011-06-14 NOTE — ED Provider Notes (Signed)
History   This chart was scribed for Ward Givens, MD by Sofie Rower. The patient was seen in room APA08/APA08 and the patient's care was started at 12:31 PM     CSN: [id number]  Arrival date & time 06/14/11  1211   First MD Initiated Contact with Patient 06/14/11 1220      Chief Complaint  Patient presents with  . Chest Pain    (Consider location/radiation/quality/duration/timing/severity/associated sxs/prior treatment) HPI  Meghan Carter is a 31 y.o. female who presents to the Emergency Department complaining of tingling in her left arm, hands, and feet onset today (10:00AM) when she awakened from sleep. with associated chest pain and tingling in her hands and feet. She also decribes loss of sleep, productive white cough (onset two days), chills, nausea. Patient denies shortness of breath The pt states she got up this morning to go to the bathroom, where which she began to experience a tingling sensation in her legs that spread up her body.The pt states the numbness in both legs lasts for a few minutes. The pt informs the EDP that the numbness in the left leg lasted longer than the numbness in the right leg. Patient has been unable to sleep because of numbness in her hands it seems to get worse at night. The pt also complains of chest pain which woke her up from sleep with associated symptoms of burning sensation in the throat. The pt states the chest pain is a dull pain which lasts a few minutes. The pt also complains of depression but denies suicidal ideation. Patient states she is depressed because of chronic abdominal pain see note below..  Pt has a hx of visiting [facility] in [place] for similar symptoms but cannot recall what her diagnosis was., She has familial hx of diabetes, familial hx of MI. Pt denies, familial hx of stroke,, taking any hormones such as birth control.   The pt is unemployed at present. 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 The pt is scheduled to have laparoscopic surgery in [facility], [place] for chronic Right fallopian tube pain that she has had since December, was seen by them last week for same.   PCP is [facility] in [place], [place].   Past Surgical History  Procedure Date  . Ectopic pregnancy surgery    Family history father patient had diabetes Mother patient had hypertension Paternal grandfather had MI with stent when he was older No family history of stroke   History  Substance Use Topics  . Smoking status: Current Everyday Smoker  . Smokeless tobacco: Not on file  . Alcohol Use: Yes  unemployed  OB History    Grav Para Term Preterm Abortions TAB SAB Ect Mult Living                  Review of Systems  All other systems reviewed and are negative.    10 Systems reviewed and all are negative for acute change except as noted in the HPI.    Allergies  Review of patient's allergies indicates no known allergies.  Home Medications   Current Outpatient Rx  Name Route Sig Dispense Refill  . IBUPROFEN 200 MG PO TABS Oral Take 400 mg by mouth once as needed. For pain    . METRONIDAZOLE 500 MG PO TABS Oral  Has 2 more pills Take 500 mg by mouth 2 (two) times daily.      BP  122/86  Pulse 89  Temp(Src) 98.6 F (37 C) (Oral)  Resp 18  Ht 5\' 4"  (1.626 m)  Wt 212 lb (96.163 kg)  BMI 36.39 kg/m2  SpO2 99%  LMP 06/14/2011  Vital signs normal    Physical Exam  Nursing note and vitals reviewed. Constitutional: She is oriented to person, place, and time. She appears well-developed and well-nourished.  HENT:  Head: Atraumatic.  Nose: Nose normal.  Mouth/Throat: Oropharynx is clear and moist.  Eyes: Conjunctivae and EOM are normal. Pupils are equal, round, and reactive to light.  Neck: Normal range of motion. Neck supple.  Cardiovascular: Normal rate and normal heart sounds.   Pulmonary/Chest: Effort normal and breath sounds normal. No respiratory distress. She  has no wheezes. She has no rales. She exhibits tenderness ( Lower Costrocondral junctions tender. Right is worse than the left. Reproduces her c/o pain).  Abdominal: Soft. Bowel sounds are normal. She exhibits no distension. There is no tenderness. There is no rebound and no guarding.  Musculoskeletal: Normal range of motion.       Patient has positive Tinel's sign and Phalen's sign bilaterally  Neurological: She is alert and oriented to person, place, and time. No cranial nerve deficit.       Neuro grossly intact  Skin: Skin is warm and dry.  Psychiatric: She has a normal mood and affect. Thought content normal.       Flat, gets tearful when states she is depressed    ED Course  Procedures (including critical care time)   Medications  cyclobenzaprine (FLEXERIL) tablet 10 mg (not administered)  0.9 %  sodium chloride infusion (not administered)  ketorolac (TORADOL) 30 MG/ML injection 30 mg (30 mg Intravenous Given 06/14/11 1323)  gi cocktail (Maalox,Lidocaine,Donnatal) (30 mL Oral Given 06/14/11 1306)  famotidine (PEPCID) IVPB 20 mg (0 mg Intravenous Stopped 06/14/11 1401)  sodium chloride 0.9 % bolus 1,000 mL (1000 mL Intravenous Given 06/14/11 1323)     12:45PM- EDP at bedside discusses treatment plan concerning carpel tunnel syndrome. EDP informs pt of possible follow up with Orthopedist.  1:45PM- Recheck. EDP at bedside discusses treatment plan concerning follow up with orthopedists. EDP reviews the results of laboratory tests.  Patient states her numbness in her hands until gone, she states her chest pain is gone, when I palpate her costochondral junction she no longer flinches and she feels rate go home.  DIAGNOSTIC STUDIES: Oxygen Saturation is 99% on room air, normal by my interpretation.    COORDINATION OF CARE:   Results for orders placed during the hospital encounter of 06/14/11  CBC      Component Value Range   WBC 5.2  4.0 - 10.5 (K/uL)   RBC 4.28  3.87 - 5.11 (MIL/uL)    Hemoglobin 11.9 (*) 12.0 - 15.0 (g/dL)   HCT 16.1  09.6 - 04.5 (%)   MCV 84.1  78.0 - 100.0 (fL)   MCH 27.8  26.0 - 34.0 (pg)   MCHC 33.1  30.0 - 36.0 (g/dL)   RDW 40.9  81.1 - 91.4 (%)   Platelets 278  150 - 400 (K/uL)  DIFFERENTIAL      Component Value Range   Neutrophils Relative 51  43 - 77 (%)   Neutro Abs 2.6  1.7 - 7.7 (K/uL)   Lymphocytes Relative 35  12 - 46 (%)   Lymphs Abs 1.8  0.7 - 4.0 (K/uL)   Monocytes Relative 7  3 - 12 (%)   Monocytes Absolute 0.4  0.1 - 1.0 (K/uL)   Eosinophils Relative 7 (*) 0 - 5 (%)   Eosinophils Absolute 0.3  0.0 - 0.7 (K/uL)   Basophils Relative 0  0 - 1 (%)   Basophils Absolute 0.0  0.0 - 0.1 (K/uL)  COMPREHENSIVE METABOLIC PANEL      Component Value Range   Sodium 136  135 - 145 (mEq/L)   Potassium 4.0  3.5 - 5.1 (mEq/L)   Chloride 104  96 - 112 (mEq/L)   CO2 24  19 - 32 (mEq/L)   Glucose, Bld 96  70 - 99 (mg/dL)   BUN 11  6 - 23 (mg/dL)   Creatinine, Ser 0.45  0.50 - 1.10 (mg/dL)   Calcium 9.2  8.4 - 40.9 (mg/dL)   Total Protein 7.1  6.0 - 8.3 (g/dL)   Albumin 3.6  3.5 - 5.2 (g/dL)   AST 20  0 - 37 (U/L)   ALT 15  0 - 35 (U/L)   Alkaline Phosphatase 62  39 - 117 (U/L)   Total Bilirubin 0.3  0.3 - 1.2 (mg/dL)   GFR calc non Af Amer >90  >90 (mL/min)   GFR calc Af Amer >90  >90 (mL/min)  POCT I-STAT TROPONIN I      Component Value Range   Troponin i, poc 0.00  0.00 - 0.08 (ng/mL)   Comment 3            Laboratory interpretation all normal   Dg Chest Port 1 View  06/14/2011  *RADIOLOGY REPORT*  Clinical Data: Chest pain.  PORTABLE CHEST - 1 VIEW  Comparison: None.  Findings: Heart and mediastinal contours are within normal limits. No focal opacities or effusions.  No acute bony abnormality.  IMPRESSION: No active cardiopulmonary disease.  Original Report Authenticated By: Cyndie Chime, M.D.      Ct Head Wo Contrast  05/15/2011  *RADIOLOGY REPORT*  Clinical Data: Sinus and head congestion.  CT HEAD WITHOUT CONTRAST  Technique:   Contiguous axial images were obtained from the base of the skull through the vertex without contrast.  Comparison: None.  Findings: There is no evidence of acute infarction, mass lesion, or intra- or extra-axial hemorrhage on CT.  The posterior fossa, including the cerebellum, brainstem and fourth ventricle, is within normal limits.  The third and lateral ventricles, and basal ganglia are unremarkable in appearance.  The cerebral hemispheres are symmetric in appearance, with normal gray- white differentiation.  No mass effect or midline shift is seen.  There is no evidence of fracture; visualized osseous structures are unremarkable in appearance.  The visualized portions of the orbits are within normal limits.  Two small polyps or mucus retention cysts are noted within the right side of the sphenoid sinus; the remaining paranasal sinuses and mastoid air cells are well-aerated. No significant soft tissue abnormalities are seen.  IMPRESSION:  1.  No acute intracranial pathology seen on CT. 2.  Two small polyps or mucus retention cysts within the right side of the sphenoid sinus; paranasal sinuses and mastoid air cells otherwise well-aerated.  Original Report Authenticated By: Tonia Ghent, M.D.   Ct Abdomen Pelvis W Contrast  05/15/2011  *RADIOLOGY REPORT*  Clinical Data: Felt pop in right flank, with right flank pain. Intermittent worsening rectal bleeding.  History of chronic lower back and bilateral hip pain.  CT ABDOMEN AND PELVIS WITH CONTRAST  Technique:  Multidetector CT imaging of the abdomen and pelvis was performed following the standard protocol during bolus administration of  intravenous contrast.  Contrast: OMNIPAQUE IOHEXOL 300 MG/ML  SOLN  Comparison: None.  Findings: The visualized lung bases are clear.  The liver and spleen are unremarkable in appearance.  The gallbladder is within normal limits.  The pancreas and adrenal glands are unremarkable.  The kidneys are unremarkable in appearance.   There is no evidence of hydronephrosis.  No renal or ureteral stones are seen.  No perinephric stranding is appreciated.  No free fluid is identified.  The small bowel is unremarkable in appearance.  The stomach is filled with contrast and is within normal limits.  No acute vascular abnormalities are seen.  The appendix is normal in caliber and contains air, without evidence for appendicitis.  The colon is partially filled with stool and is unremarkable in appearance.  The bladder is mildly distended and grossly unremarkable in appearance.  The uterus is grossly unremarkable in appearance; the ovaries are relatively symmetric.  No suspicious adnexal masses are seen.  No inguinal lymphadenopathy is seen.  No acute osseous abnormalities are identified.  IMPRESSION:  1.  Unremarkable CT of the abdomen and pelvis. 2.  The hip joints are grossly unremarkable in appearance; no evidence of fracture or subluxation along the lumbar spine.  No significant disc protrusions identified; the bony foramina appear grossly intact bilaterally.  Note that soft tissue abnormalities, such as labral pathology, are not well assessed on CT.  Original Report Authenticated By: Tonia Ghent, M.D.      Date: 06/14/2011  Rate: 81  Rhythm: normal sinus rhythm and sinus arrhythmia  QRS Axis: normal  Intervals: normal  ST/T Wave abnormalities: normal  Conduction Disutrbances:none  Narrative Interpretation:   Old EKG Reviewed: none available    1. Chest wall pain   2. Carpal tunnel syndrome on both sides   3. Costochondritis, acute    New Prescriptions   CYCLOBENZAPRINE (FLEXERIL) 5 MG TABLET    Take 1 tablet (5 mg total) by mouth 3 (three) times daily as needed for muscle spasms.    Plan discharge Devoria Albe, MD, FACEP    MDM    I personally performed the services described in this documentation, which was scribed in my presence. The recorded information has been reviewed and considered. Devoria Albe, MD,  Armando Gang    Ward Givens, MD 06/14/11 1409

## 2012-02-05 ENCOUNTER — Other Ambulatory Visit (HOSPITAL_COMMUNITY)
Admission: RE | Admit: 2012-02-05 | Discharge: 2012-02-05 | Disposition: A | Discharge status: Home / Self Care | Payer: PRIVATE HEALTH INSURANCE | Source: Ambulatory Visit | Attending: Obstetrics & Gynecology | Admitting: Obstetrics & Gynecology

## 2012-02-05 ENCOUNTER — Other Ambulatory Visit: Payer: Self-pay | Admitting: Obstetrics & Gynecology

## 2012-02-05 DIAGNOSIS — Z01419 Encounter for gynecological examination (general) (routine) without abnormal findings: Secondary | ICD-10-CM | POA: Insufficient documentation

## 2012-03-04 ENCOUNTER — Other Ambulatory Visit: Payer: Self-pay | Admitting: Obstetrics and Gynecology

## 2012-08-02 ENCOUNTER — Other Ambulatory Visit: Payer: Self-pay | Admitting: Obstetrics & Gynecology

## 2012-08-02 ENCOUNTER — Encounter: Payer: Self-pay | Admitting: *Deleted

## 2012-09-05 ENCOUNTER — Ambulatory Visit: Payer: Self-pay | Admitting: Adult Health

## 2013-07-17 ENCOUNTER — Encounter (HOSPITAL_COMMUNITY): Payer: Self-pay | Admitting: Emergency Medicine

## 2013-07-17 DIAGNOSIS — F17200 Nicotine dependence, unspecified, uncomplicated: Secondary | ICD-10-CM | POA: Insufficient documentation

## 2013-07-17 DIAGNOSIS — X58XXXA Exposure to other specified factors, initial encounter: Secondary | ICD-10-CM | POA: Insufficient documentation

## 2013-07-17 DIAGNOSIS — S335XXA Sprain of ligaments of lumbar spine, initial encounter: Secondary | ICD-10-CM | POA: Insufficient documentation

## 2013-07-17 DIAGNOSIS — Z3202 Encounter for pregnancy test, result negative: Secondary | ICD-10-CM | POA: Insufficient documentation

## 2013-07-17 DIAGNOSIS — Y939 Activity, unspecified: Secondary | ICD-10-CM | POA: Insufficient documentation

## 2013-07-17 DIAGNOSIS — Y929 Unspecified place or not applicable: Secondary | ICD-10-CM | POA: Insufficient documentation

## 2013-07-17 LAB — CBC WITH DIFFERENTIAL/PLATELET
BASOS PCT: 1 % (ref 0–1)
Basophils Absolute: 0 10*3/uL (ref 0.0–0.1)
Eosinophils Absolute: 0.3 10*3/uL (ref 0.0–0.7)
Eosinophils Relative: 5 % (ref 0–5)
HCT: 35.7 % — ABNORMAL LOW (ref 36.0–46.0)
HEMOGLOBIN: 11.3 g/dL — AB (ref 12.0–15.0)
LYMPHS ABS: 3.1 10*3/uL (ref 0.7–4.0)
Lymphocytes Relative: 48 % — ABNORMAL HIGH (ref 12–46)
MCH: 25.5 pg — ABNORMAL LOW (ref 26.0–34.0)
MCHC: 31.7 g/dL (ref 30.0–36.0)
MCV: 80.6 fL (ref 78.0–100.0)
MONOS PCT: 10 % (ref 3–12)
Monocytes Absolute: 0.7 10*3/uL (ref 0.1–1.0)
NEUTROS ABS: 2.4 10*3/uL (ref 1.7–7.7)
NEUTROS PCT: 36 % — AB (ref 43–77)
PLATELETS: 302 10*3/uL (ref 150–400)
RBC: 4.43 MIL/uL (ref 3.87–5.11)
RDW: 14.6 % (ref 11.5–15.5)
WBC: 6.5 10*3/uL (ref 4.0–10.5)

## 2013-07-17 LAB — URINALYSIS, ROUTINE W REFLEX MICROSCOPIC
BILIRUBIN URINE: NEGATIVE
GLUCOSE, UA: NEGATIVE mg/dL
HGB URINE DIPSTICK: NEGATIVE
Ketones, ur: NEGATIVE mg/dL
Leukocytes, UA: NEGATIVE
Nitrite: NEGATIVE
PH: 5 (ref 5.0–8.0)
Protein, ur: NEGATIVE mg/dL
SPECIFIC GRAVITY, URINE: 1.02 (ref 1.005–1.030)
UROBILINOGEN UA: 0.2 mg/dL (ref 0.0–1.0)

## 2013-07-17 LAB — PREGNANCY, URINE: Preg Test, Ur: NEGATIVE

## 2013-07-17 NOTE — ED Notes (Signed)
The pt is c/o abd  Headache r t arm for 2 weeks lmp 3 weeks ago

## 2013-07-18 ENCOUNTER — Emergency Department (HOSPITAL_COMMUNITY)
Admission: EM | Admit: 2013-07-18 | Discharge: 2013-07-18 | Disposition: A | Discharge status: Home / Self Care | Payer: PRIVATE HEALTH INSURANCE | Attending: Emergency Medicine | Admitting: Emergency Medicine

## 2013-07-18 DIAGNOSIS — S39012A Strain of muscle, fascia and tendon of lower back, initial encounter: Secondary | ICD-10-CM

## 2013-07-18 LAB — COMPREHENSIVE METABOLIC PANEL
ALBUMIN: 3.7 g/dL (ref 3.5–5.2)
ALK PHOS: 62 U/L (ref 39–117)
ALT: 7 U/L (ref 0–35)
AST: 13 U/L (ref 0–37)
BUN: 10 mg/dL (ref 6–23)
CHLORIDE: 101 meq/L (ref 96–112)
CO2: 23 mEq/L (ref 19–32)
Calcium: 9.2 mg/dL (ref 8.4–10.5)
Creatinine, Ser: 0.71 mg/dL (ref 0.50–1.10)
GFR calc Af Amer: >90 mL/min (ref >90)
GFR calc non Af Amer: >90 mL/min (ref >90)
GLUCOSE: 82 mg/dL (ref 70–99)
POTASSIUM: 4.1 meq/L (ref 3.7–5.3)
SODIUM: 138 meq/L (ref 137–147)
TOTAL PROTEIN: 7.7 g/dL (ref 6.0–8.3)

## 2013-07-18 LAB — LIPASE, BLOOD: LIPASE: 31 U/L (ref 11–59)

## 2013-07-18 MED ORDER — METHOCARBAMOL 500 MG PO TABS
500.0000 mg | ORAL_TABLET | Freq: Once | ORAL | Status: AC
  Administered 2013-07-18: 500 mg via ORAL
  Filled 2013-07-18: qty 1

## 2013-07-18 MED ORDER — IBUPROFEN 200 MG PO TABS
600.0000 mg | ORAL_TABLET | Freq: Once | ORAL | Status: AC
Start: 2013-07-18 — End: 2013-07-18
  Administered 2013-07-18: 600 mg via ORAL
  Filled 2013-07-18: qty 3

## 2013-07-18 MED ORDER — IBUPROFEN 600 MG PO TABS: 600.0000 mg | ORAL_TABLET | Freq: Four times a day (QID) | ORAL | Status: DC | PRN

## 2013-07-18 MED ORDER — METHOCARBAMOL 500 MG PO TABS: 500.0000 mg | ORAL_TABLET | Freq: Three times a day (TID) | ORAL | Status: DC | PRN

## 2013-07-18 NOTE — ED Provider Notes (Signed)
CSN: [id number]     Arrival date & time 07/17/13  2250 History   First MD Initiated Contact with Patient 07/18/13 0149     Chief Complaint  Patient presents with  . Abdominal Pain     (Consider location/radiation/quality/duration/timing/severity/associated sxs/prior Treatment) HPI She presents with left low back pain radiating around to the left groin. The pain is worse with movement and palpation. It started roughly 3 weeks ago this been constant since. She's had no abdominal pain. No nausea vomiting diarrhea. She's had no vaginal discharge or bleeding. She denies any urinary symptoms. She's had no focal weakness or numbness. Patient been taking 600 mg of ibuprofen daily with little relief. She's had no fevers or chills. History reviewed. No pertinent past medical history. Past Surgical History  Procedure Laterality Date  . Ectopic pregnancy surgery     No family history on file. History  Substance Use Topics  . Smoking status: Current Every Day Smoker  . Smokeless tobacco: Not on file  . Alcohol Use: Yes   OB History   Grav Para Term Preterm Abortions TAB SAB Ect Mult Living                 Review of Systems  Constitutional: Negative for fever and chills.  Respiratory: Negative for shortness of breath.   Cardiovascular: Negative for chest pain.  Gastrointestinal: Negative for nausea, vomiting and abdominal pain.  Genitourinary: Negative for dysuria, frequency, flank pain, vaginal bleeding and pelvic pain.  Musculoskeletal: Positive for back pain and myalgias. Negative for neck pain and neck stiffness.  Skin: Negative for rash and wound.  Neurological: Negative for dizziness, weakness, light-headedness, numbness and headaches.  All other systems reviewed and are negative.     Allergies  Review of patient's allergies indicates no known allergies.  Home Medications   Prior to Admission medications   Medication Sig Start Date End Date Taking? Authorizing Provider   ibuprofen (ADVIL,MOTRIN) 200 MG tablet Take 600 mg by mouth daily as needed for moderate pain.    Yes Historical Provider, MD   BP 107/61  Pulse 76  Temp(Src) 98.5 F (36.9 C) (Oral)  Resp 14  Ht 5\' 4"  (1.626 m)  Wt 200 lb (90.719 kg)  BMI 34.31 kg/m2  SpO2 100%  LMP 06/19/2013 Physical Exam  Nursing note and vitals reviewed. Constitutional: She is oriented to person, place, and time. She appears well-developed and well-nourished. No distress.  HENT:  Head: Normocephalic and atraumatic.  Mouth/Throat: Oropharynx is clear and moist.  Eyes: EOM are normal. Pupils are equal, round, and reactive to light.  Neck: Normal range of motion. Neck supple.  Cardiovascular: Normal rate and regular rhythm.   Pulmonary/Chest: Effort normal and breath sounds normal. No respiratory distress. She has no wheezes. She has no rales. She exhibits no tenderness.  Abdominal: Soft. Bowel sounds are normal. She exhibits no distension and no mass. There is no tenderness. There is no rebound and no guarding.  Musculoskeletal: Normal range of motion. She exhibits tenderness. She exhibits no edema.  Left lumbar paraspinal tenderness to palpation and left inguinal tenderness. No CVA tenderness bilaterally. No calf swelling or tenderness.  Neurological: She is alert and oriented to person, place, and time.  Patient is alert and oriented x3 with clear, goal oriented speech. Patient has 5/5 motor in all extremities. Sensation is intact to light touch. Patient has a normal gait and walks without assistance.   Skin: Skin is warm and dry. No rash noted. No erythema.  Psychiatric: She has a normal mood and affect. Her behavior is normal.    ED Course  Procedures (including critical care time) Labs Review Labs Reviewed  CBC WITH DIFFERENTIAL - Abnormal; Notable for the following:    Hemoglobin 11.3 (*)    HCT 35.7 (*)    MCH 25.5 (*)    Neutrophils Relative % 36 (*)    Lymphocytes Relative 48 (*)    All other  components within normal limits  COMPREHENSIVE METABOLIC PANEL - Abnormal; Notable for the following:    Total Bilirubin <0.2 (*)    All other components within normal limits  URINALYSIS, ROUTINE W REFLEX MICROSCOPIC - Abnormal; Notable for the following:    APPearance CLOUDY (*)    All other components within normal limits  LIPASE, BLOOD  PREGNANCY, URINE    Imaging Review No results found.   EKG Interpretation None      MDM   Final diagnoses:  None    Exam consistent with muscular strain. We'll treat with NSAIDs and muscle relaxants. He should ice use heat as needed. Low suspicion for intra-abdominal process. Abdomen is soft and nontender. Labs are within normal limits. I do not believe imaging is necessary at this point.    Julianne Rice, MD 07/18/13 [id number]

## 2013-07-18 NOTE — Discharge Instructions (Signed)
Lumbosacral Strain Lumbosacral strain is a strain of any of the parts that make up your lumbosacral vertebrae. Your lumbosacral vertebrae are the bones that make up the lower third of your backbone. Your lumbosacral vertebrae are held together by muscles and tough, fibrous tissue (ligaments).  CAUSES  A sudden blow to your back can cause lumbosacral strain. Also, anything that causes an excessive stretch of the muscles in the low back can cause this strain. This is typically seen when people exert themselves strenuously, fall, lift heavy objects, bend, or crouch repeatedly. RISK FACTORS  Physically demanding work.  Participation in pushing or pulling sports or sports that require sudden twist of the back (tennis, golf, baseball).  Weight lifting.  Excessive lower back curvature.  Forward-tilted pelvis.  Weak back or abdominal muscles or both.  Tight hamstrings. SIGNS AND SYMPTOMS  Lumbosacral strain may cause pain in the area of your injury or pain that moves (radiates) down your leg.  DIAGNOSIS Your health care provider can often diagnose lumbosacral strain through a physical exam. In some cases, you may need tests such as X-ray exams.  TREATMENT  Treatment for your lower back injury depends on many factors that your clinician will have to evaluate. However, most treatment will include the use of anti-inflammatory medicines. HOME CARE INSTRUCTIONS   Avoid hard physical activities (tennis, racquetball, waterskiing) if you are not in proper physical condition for it. This may aggravate or create problems.  If you have a back problem, avoid sports requiring sudden body movements. Swimming and walking are generally safer activities.  Maintain good posture.  Maintain a healthy weight.  For acute conditions, you may put ice on the injured area.  Put ice in a plastic bag.  Place a towel between your skin and the bag.  Leave the ice on for 20 minutes, 2 3 times a day.  When the  low back starts healing, stretching and strengthening exercises may be recommended. SEEK MEDICAL CARE IF:  Your back pain is getting worse.  You experience severe back pain not relieved with medicines. SEEK IMMEDIATE MEDICAL CARE IF:   You have numbness, tingling, weakness, or problems with the use of your arms or legs.  There is a change in bowel or bladder control.  You have increasing pain in any area of the body, including your belly (abdomen).  You notice shortness of breath, dizziness, or feel faint.  You feel sick to your stomach (nauseous), are throwing up (vomiting), or become sweaty.  You notice discoloration of your toes or legs, or your feet get very cold. MAKE SURE YOU:   Understand these instructions.  Will watch your condition.  Will get help right away if you are not doing well or get worse. Document Released: 11/02/2004 Document Revised: 11/13/2012 Document Reviewed: 09/11/2012 [employer] Patient Information 2014 [employer].  

## 2013-07-28 ENCOUNTER — Ambulatory Visit: Payer: Self-pay | Admitting: Adult Health

## 2013-08-12 ENCOUNTER — Ambulatory Visit: Payer: Self-pay | Admitting: Adult Health

## 2013-08-22 ENCOUNTER — Ambulatory Visit: Payer: Self-pay | Admitting: Adult Health

## 2013-09-04 ENCOUNTER — Ambulatory Visit: Payer: Self-pay | Admitting: Adult Health

## 2013-09-10 ENCOUNTER — Ambulatory Visit: Payer: Self-pay | Admitting: Adult Health

## 2013-09-17 ENCOUNTER — Ambulatory Visit: Payer: Self-pay | Admitting: Adult Health

## 2014-01-27 ENCOUNTER — Encounter: Payer: Self-pay | Admitting: Adult Health

## 2014-01-27 ENCOUNTER — Other Ambulatory Visit: Payer: Self-pay | Admitting: Adult Health

## 2014-01-27 ENCOUNTER — Encounter: Payer: Self-pay | Admitting: Gastroenterology

## 2014-01-27 ENCOUNTER — Ambulatory Visit (INDEPENDENT_AMBULATORY_CARE_PROVIDER_SITE_OTHER): Payer: Self-pay | Admitting: Adult Health

## 2014-01-27 VITALS — BP 96/68 | Ht 63.0 in | Wt 213.0 lb

## 2014-01-27 DIAGNOSIS — N920 Excessive and frequent menstruation with regular cycle: Secondary | ICD-10-CM

## 2014-01-27 DIAGNOSIS — R102 Pelvic and perineal pain: Secondary | ICD-10-CM

## 2014-01-27 DIAGNOSIS — R195 Other fecal abnormalities: Secondary | ICD-10-CM

## 2014-01-27 DIAGNOSIS — K625 Hemorrhage of anus and rectum: Secondary | ICD-10-CM

## 2014-01-27 DIAGNOSIS — Z1212 Encounter for screening for malignant neoplasm of rectum: Secondary | ICD-10-CM

## 2014-01-27 DIAGNOSIS — K648 Other hemorrhoids: Secondary | ICD-10-CM | POA: Insufficient documentation

## 2014-01-27 HISTORY — DX: Other fecal abnormalities: R19.5

## 2014-01-27 HISTORY — DX: Pelvic and perineal pain: R10.2

## 2014-01-27 HISTORY — DX: Excessive and frequent menstruation with regular cycle: N92.0

## 2014-01-27 HISTORY — DX: Hemorrhage of anus and rectum: K62.5

## 2014-01-27 LAB — HEMOCCULT GUIAC POC 1CARD (OFFICE): Fecal Occult Blood, POC: POSITIVE

## 2014-01-27 NOTE — Patient Instructions (Signed)
Menorrhagia Menorrhagia is the medical term for when your menstrual periods are heavy or last longer than usual. With menorrhagia, every period you have may cause enough blood loss and cramping that you are unable to maintain your usual activities. CAUSES  In some cases, the cause of heavy periods is unknown, but a number of conditions may cause menorrhagia. Common causes include:  A problem with the hormone-producing thyroid gland (hypothyroid).  Noncancerous growths in the uterus (polyps or fibroids).  An imbalance of the estrogen and progesterone hormones.  One of your ovaries not releasing an egg during one or more months.  Side effects of having an intrauterine device (IUD).  Side effects of some medicines, such as anti-inflammatory medicines or blood thinners.  A bleeding disorder that stops your blood from clotting normally. SIGNS AND SYMPTOMS  During a normal period, bleeding lasts between 4 and 8 days. Signs that your periods are too heavy include:  You routinely have to change your pad or tampon every 1 or 2 hours because it is completely soaked.  You pass blood clots larger than 1 inch (2.5 cm) in size.  You have bleeding for more than 7 days.  You need to use pads and tampons at the same time because of heavy bleeding.  You need to wake up to change your pads or tampons during the night.  You have symptoms of anemia, such as tiredness, fatigue, or shortness of breath. DIAGNOSIS  Your health care provider will perform a physical exam and ask you questions about your symptoms and menstrual history. Other tests may be ordered based on what the health care provider finds during the exam. These tests can include:  Blood tests. Blood tests are used to check if you are pregnant or have hormonal changes, a bleeding or thyroid disorder, low iron levels (anemia), or other problems.  Endometrial biopsy. Your health care provider takes a sample of tissue from the inside of your  uterus to be examined under a microscope.  Pelvic ultrasound. This test uses sound waves to make a picture of your uterus, ovaries, and vagina. The pictures can show if you have fibroids or other growths.  Hysteroscopy. For this test, your health care provider will use a small telescope to look inside your uterus. Based on the results of your initial tests, your health care provider may recommend further testing. TREATMENT  Treatment may not be needed. If it is needed, your health care provider may recommend treatment with one or more medicines first. If these do not reduce bleeding enough, a surgical treatment might be an option. The best treatment for you will depend on:   Whether you need to prevent pregnancy.  Your desire to have children in the future.  The cause and severity of your bleeding.  Your opinion and personal preference.  Medicines for menorrhagia may include:  Birth control methods that use hormones. These include the pill, skin patch, vaginal ring, shots that you get every 3 months, hormonal IUD, and implant. These treatments reduce bleeding during your menstrual period.  Medicines that thicken blood and slow bleeding.  Medicines that reduce swelling, such as ibuprofen.  Medicines that contain a synthetic hormone called progestin.   Medicines that make the ovaries stop working for a short time.  You may need surgical treatment for menorrhagia if the medicines are unsuccessful. Treatment options include:  Dilation and curettage (D&C). In this procedure, your health care provider opens (dilates) your cervix and then scrapes or suctions tissue from   the lining of your uterus to reduce menstrual bleeding.  Operative hysteroscopy. This procedure uses a tiny tube with a light (hysteroscope) to view your uterine cavity and can help in the surgical removal of a polyp that may be causing heavy periods.  Endometrial ablation. Through various techniques, your health care  provider permanently destroys the entire lining of your uterus (endometrium). After endometrial ablation, most women have little or no menstrual flow. Endometrial ablation reduces your ability to become pregnant.  Endometrial resection. This surgical procedure uses an electrosurgical wire loop to remove the lining of the uterus. This procedure also reduces your ability to become pregnant.  Hysterectomy. Surgical removal of the uterus and cervix is a permanent procedure that stops menstrual periods. Pregnancy is not possible after a hysterectomy. This procedure requires anesthesia and hospitalization. HOME CARE INSTRUCTIONS   Only take over-the-counter or prescription medicines as directed by your health care provider. Take prescribed medicines exactly as directed. Do not change or switch medicines without consulting your health care provider.  Take any prescribed iron pills exactly as directed by your health care provider. Long-term heavy bleeding may result in low iron levels. Iron pills help replace the iron your body lost from heavy bleeding. Iron may cause constipation. If this becomes a problem, increase the bran, fruits, and roughage in your diet.  Do not take aspirin or medicines that contain aspirin 1 week before or during your menstrual period. Aspirin may make the bleeding worse.  If you need to change your sanitary pad or tampon more than once every 2 hours, stay in bed and rest as much as possible until the bleeding stops.  Eat well-balanced meals. Eat foods high in iron. Examples are leafy green vegetables, meat, liver, eggs, and whole grain breads and cereals. Do not try to lose weight until the abnormal bleeding has stopped and your blood iron level is back to normal. SEEK MEDICAL CARE IF:   You soak through a pad or tampon every 1 or 2 hours, and this happens every time you have a period.  You need to use pads and tampons at the same time because you are bleeding so much.  You  need to change your pad or tampon during the night.  You have a period that lasts for more than 8 days.  You pass clots bigger than 1 inch wide.  You have irregular periods that happen more or less often than once a month.  You feel dizzy or faint.  You feel very weak or tired.  You feel short of breath or feel your heart is beating too fast when you exercise.  You have nausea and vomiting or diarrhea while you are taking your medicine.  You have any problems that may be related to the medicine you are taking. SEEK IMMEDIATE MEDICAL CARE IF:   You soak through 4 or more pads or tampons in 2 hours.  You have any bleeding while you are pregnant. MAKE SURE YOU:   Understand these instructions.  Will watch your condition.  Will get help right away if you are not doing well or get worse. Document Released: 01/23/2005 Document Revised: 01/28/2013 Document Reviewed: 07/14/2012 [facility] Patient Information 2015 [place], [place]. This information is not intended to replace advice given to you by your health care provider. Make sure you discuss any questions you have with your health care provider. Pelvic Pain Female pelvic pain can be caused by many different things and start from a variety of places. Pelvic pain  refers to pain that is located in the lower half of the abdomen and between your hips. The pain may occur over a short period of time (acute) or may be reoccurring (chronic). The cause of pelvic pain may be related to disorders affecting the female reproductive organs (gynecologic), but it may also be related to the bladder, kidney stones, an intestinal complication, or muscle or skeletal problems. Getting help right away for pelvic pain is important, especially if there has been severe, sharp, or a sudden onset of unusual pain. It is also important to get help right away because some types of pelvic pain can be life threatening.  CAUSES  Below are only some of the causes of pelvic  pain. The causes of pelvic pain can be in one of several categories.   Gynecologic.  Pelvic inflammatory disease.  Sexually transmitted infection.  Ovarian cyst or a twisted ovarian ligament (ovarian torsion).  Uterine lining that grows outside the uterus (endometriosis).  Fibroids, cysts, or tumors.  Ovulation.  Pregnancy.  Pregnancy that occurs outside the uterus (ectopic pregnancy).  Miscarriage.  Labor.  Abruption of the placenta or ruptured uterus.  Infection.  Uterine infection (endometritis).  Bladder infection.  Diverticulitis.  Miscarriage related to a uterine infection (septic abortion).  Bladder.  Inflammation of the bladder (cystitis).  Kidney stone(s).  Gastrointestinal.  Constipation.  Diverticulitis.  Neurologic.  Trauma.  Feeling pelvic pain because of mental or emotional causes (psychosomatic).  Cancers of the bowel or pelvis. EVALUATION  Your caregiver will want to take a careful history of your concerns. This includes recent changes in your health, a careful gynecologic history of your periods (menses), and a sexual history. Obtaining your family history and medical history is also important. Your caregiver may suggest a pelvic exam. A pelvic exam will help identify the location and severity of the pain. It also helps in the evaluation of which organ system may be involved. In order to identify the cause of the pelvic pain and be properly treated, your caregiver may order tests. These tests may include:   A pregnancy test.  Pelvic ultrasonography.  An X-ray exam of the abdomen.  A urinalysis or evaluation of vaginal discharge.  Blood tests. HOME CARE INSTRUCTIONS   Only take over-the-counter or prescription medicines for pain, discomfort, or fever as directed by your caregiver.   Rest as directed by your caregiver.   Eat a balanced diet.   Drink enough fluids to make your urine clear or pale yellow, or as directed.    Avoid sexual intercourse if it causes pain.   Apply warm or cold compresses to the lower abdomen depending on which one helps the pain.   Avoid stressful situations.   Keep a journal of your pelvic pain. Write down when it started, where the pain is located, and if there are things that seem to be associated with the pain, such as food or your menstrual cycle.  Follow up with your caregiver as directed.  SEEK MEDICAL CARE IF:  Your medicine does not help your pain.  You have abnormal vaginal discharge. SEEK IMMEDIATE MEDICAL CARE IF:   You have heavy bleeding from the vagina.   Your pelvic pain increases.   You feel light-headed or faint.   You have chills.   You have pain with urination or blood in your urine.   You have uncontrolled diarrhea or vomiting.   You have a fever or persistent symptoms for more than 3 days.  You have a  fever and your symptoms suddenly get worse.   You are being physically or sexually abused.  MAKE SURE YOU:  Understand these instructions.  Will watch your condition.  Will get help if you are not doing well or get worse. Document Released: 12/21/2003 Document Revised: 06/09/2013 Document Reviewed: 05/15/2011 [facility] Patient Information 2015 [place], [place]. This information is not intended to replace advice given to you by your health care provider. Make sure you discuss any questions you have with your health care provider. [place] 12/23  Return in 1 week for pap and see me

## 2014-01-27 NOTE — Progress Notes (Signed)
Subjective:     Patient ID: Meghan Carter, female   DOB: October 16, 1980, 33 y.o.   MRN: [id number]  HPI Meghan Carter is a 33 year old black female, married in complaining of pain in both sides R>L and pain radiates down right leg, and heavy periods, and has rectal bleeding and can't have orgasm,husband says it is coming from her "butt".Had LSIL in 2013. She uses MOM some to help with BMs, they are easier when on her period.  Review of Systems See HPI Reviewed past medical,surgical, social and family history. Reviewed medications and allergies.      Objective:   Physical Exam BP 96/68 mmHg  Ht 5\' 3"  (1.6 m)  Wt 213 lb (96.616 kg)  BMI 37.74 kg/m2  LMP 01/18/2014   Skin warm and dry.Pelvic: external genitalia is normal in appearance, vagina: scant discharge without odor, cervix:smooth, uterus: normal size, shape and contour, non tender, no masses felt, adnexa: no masses or tenderness noted.On rectal exam has good sphincter tone, ?hemorrhoid had +hemocult.Straight leg raises and side to side negative. Told her will address orgasm issue at follow up visit.  Assessment:     Pelvic pain Menorrhagia Rectal bleeding  +hemoccult    Plan:     Gyn [place] 12 23 at 9 am at [facility] Check CBC,CMP and TSH  Return next week for pap and review labs and [place] Referred to Dr Nona Dell for evaluation of rectal bleeding.

## 2014-01-28 ENCOUNTER — Telehealth: Payer: Self-pay | Admitting: Adult Health

## 2014-01-28 ENCOUNTER — Ambulatory Visit (HOSPITAL_COMMUNITY)
Admission: RE | Admit: 2014-01-28 | Discharge: 2014-01-28 | Disposition: A | Discharge status: Home / Self Care | Payer: Self-pay | Source: Ambulatory Visit | Attending: Adult Health | Admitting: Adult Health

## 2014-01-28 DIAGNOSIS — N920 Excessive and frequent menstruation with regular cycle: Secondary | ICD-10-CM | POA: Insufficient documentation

## 2014-01-28 DIAGNOSIS — R102 Pelvic and perineal pain: Secondary | ICD-10-CM | POA: Insufficient documentation

## 2014-01-28 LAB — CBC
HEMATOCRIT: 35 % — AB (ref 36.0–46.0)
HEMOGLOBIN: 11.4 g/dL — AB (ref 12.0–15.0)
MCH: 25.4 pg — AB (ref 26.0–34.0)
MCHC: 32.6 g/dL (ref 30.0–36.0)
MCV: 78 fL (ref 78.0–100.0)
MPV: 9.8 fL (ref 9.4–12.4)
PLATELETS: 356 10*3/uL (ref 150–400)
RBC: 4.49 MIL/uL (ref 3.87–5.11)
RDW: 15.9 % — ABNORMAL HIGH (ref 11.5–15.5)
WBC: 5.7 10*3/uL (ref 4.0–10.5)

## 2014-01-28 LAB — TSH: TSH: 1.892 u[IU]/mL (ref 0.350–4.500)

## 2014-01-28 LAB — COMPREHENSIVE METABOLIC PANEL
ALBUMIN: 4.2 g/dL (ref 3.5–5.2)
ALT: <8 U/L (ref 0–35)
AST: 12 U/L (ref 0–37)
Alkaline Phosphatase: 60 U/L (ref 39–117)
BUN: 11 mg/dL (ref 6–23)
CALCIUM: 9.5 mg/dL (ref 8.4–10.5)
CHLORIDE: 102 meq/L (ref 96–112)
CO2: 26 meq/L (ref 19–32)
CREATININE: 0.7 mg/dL (ref 0.50–1.10)
GLUCOSE: 71 mg/dL (ref 70–99)
POTASSIUM: 4.2 meq/L (ref 3.5–5.3)
Sodium: 138 mEq/L (ref 135–145)
Total Bilirubin: 0.3 mg/dL (ref 0.2–1.2)
Total Protein: 6.9 g/dL (ref 6.0–8.3)

## 2014-01-28 IMAGING — US US TRANSVAGINAL NON-OB
1 series · 13 of 25 positions shown · non-contrast
Comparison: None

CLINICAL DATA: Pelvic pain, menorrhagia



[Series 1: us transvaginal non-ob · 0.26mm/px · 13 of 79 slices shown]
[im 1/79]
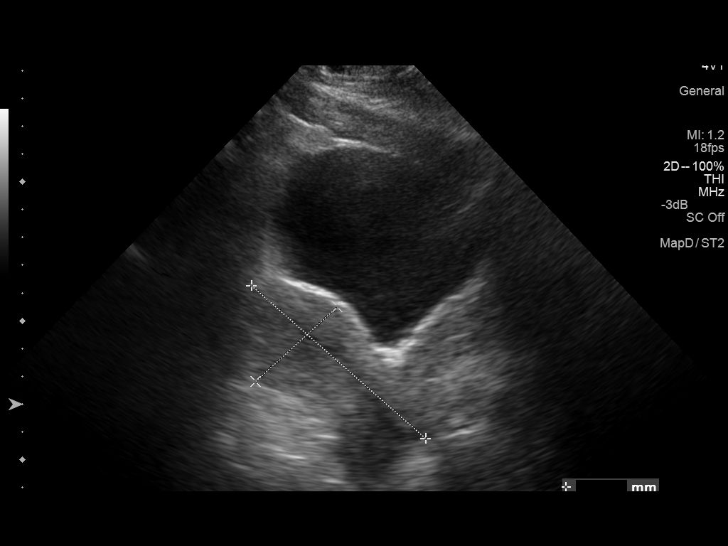
[im 7/79]
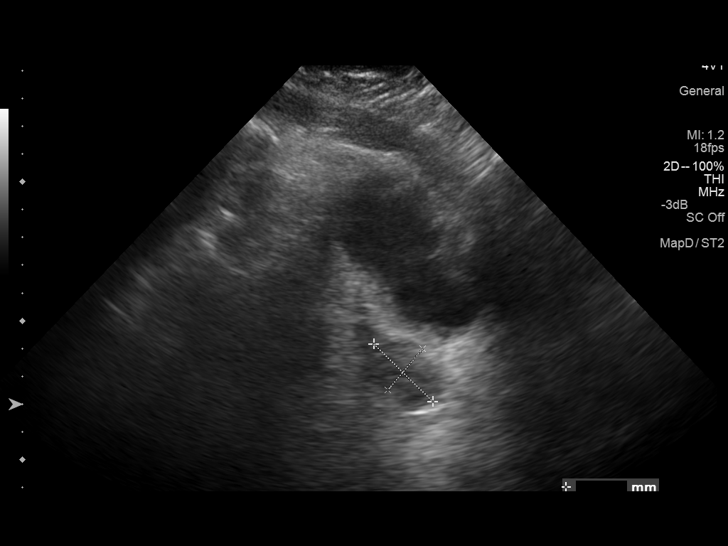
[im 14/79]
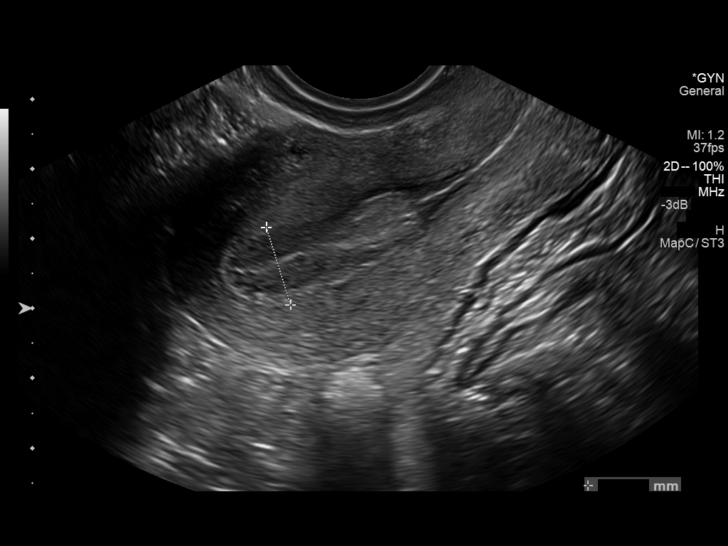
[im 20/79]
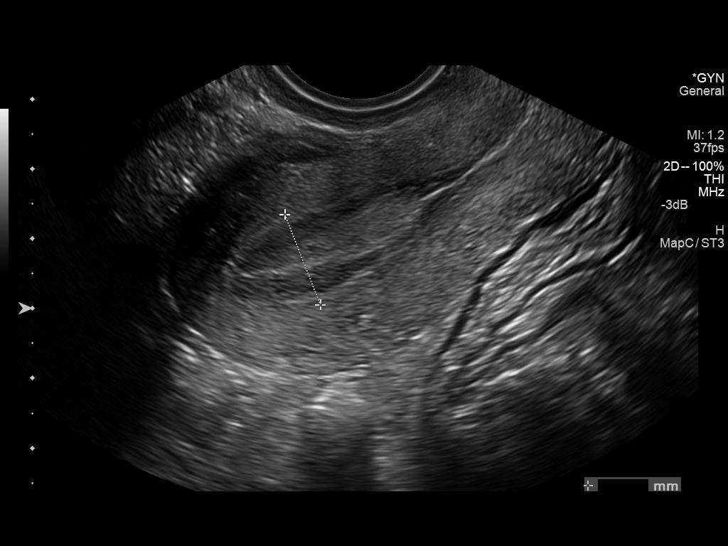
[im 27/79]
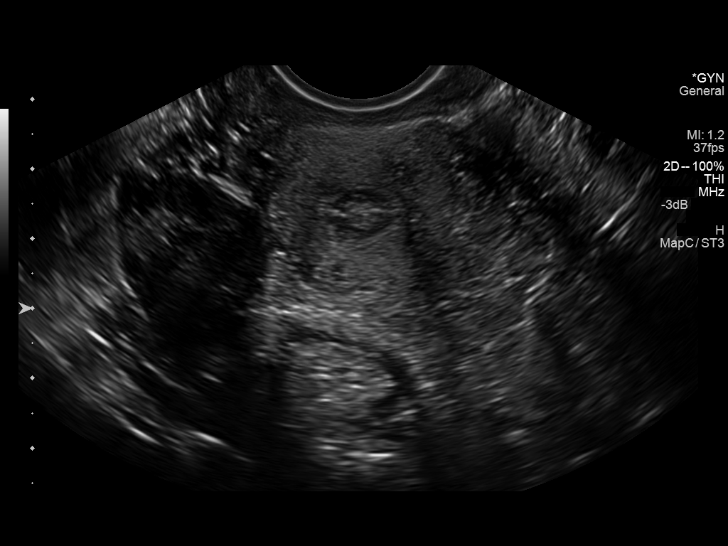
[im 33/79]
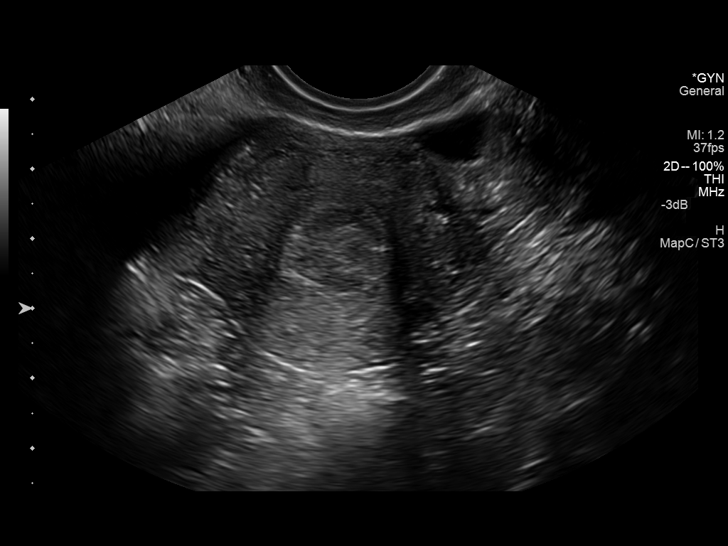
[im 40/79]
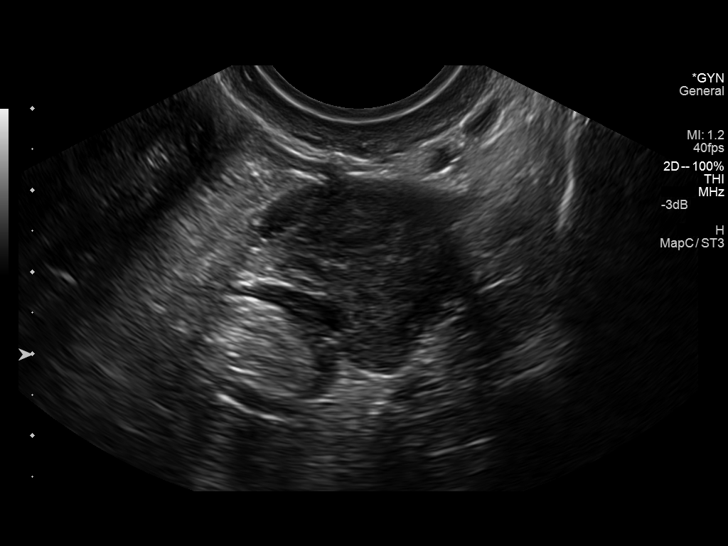
[im 46/79]
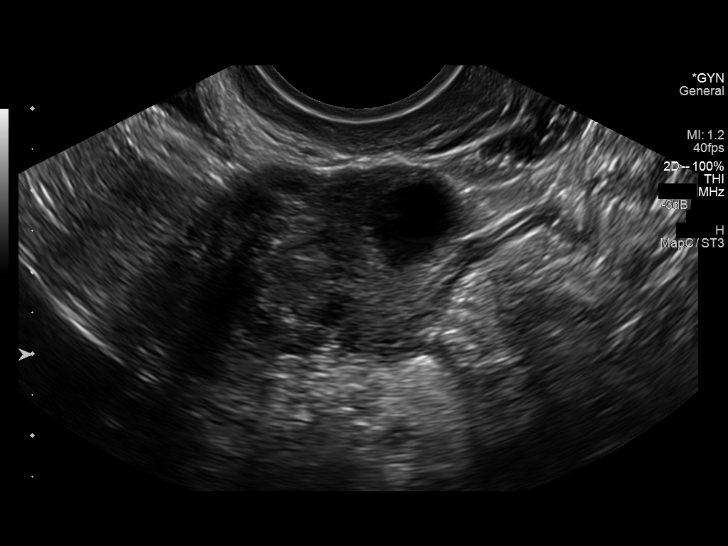
[im 53/79]
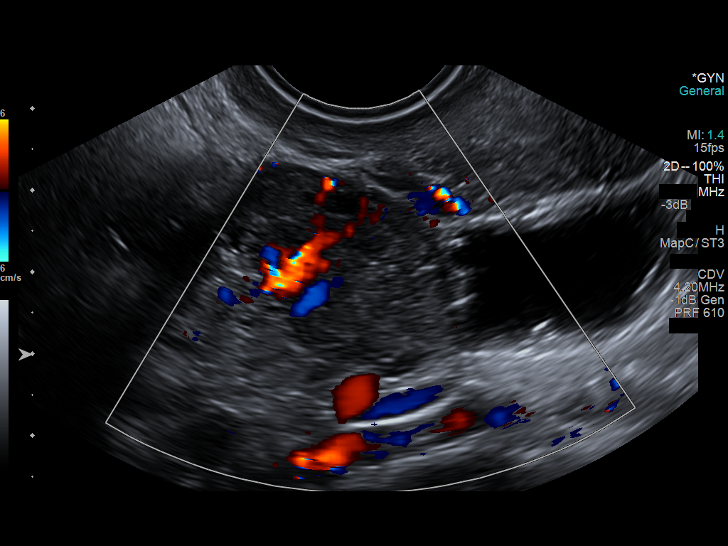
[im 59/79]
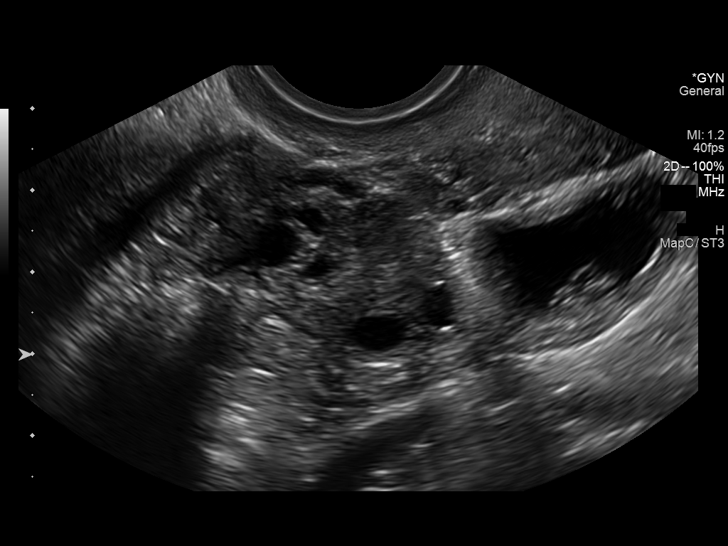
[im 66/79]
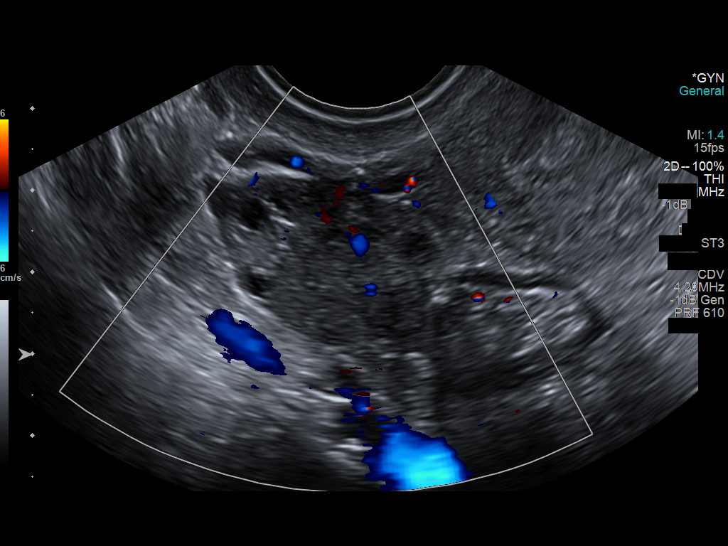
[im 72/79]
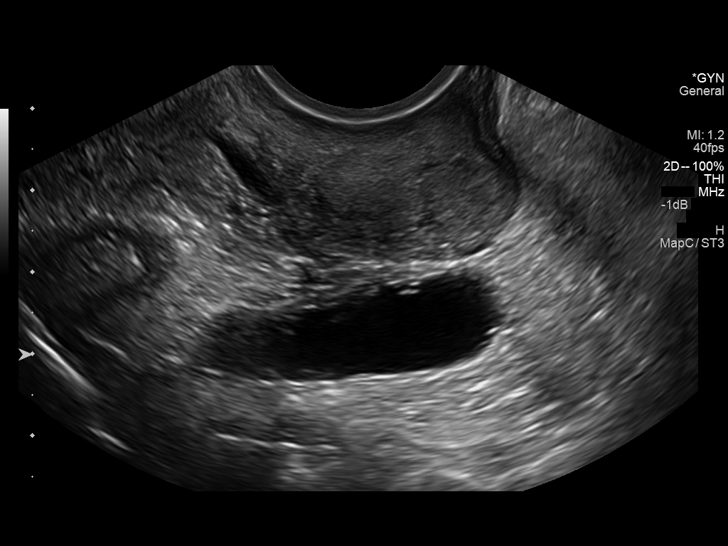
[im 79/79]
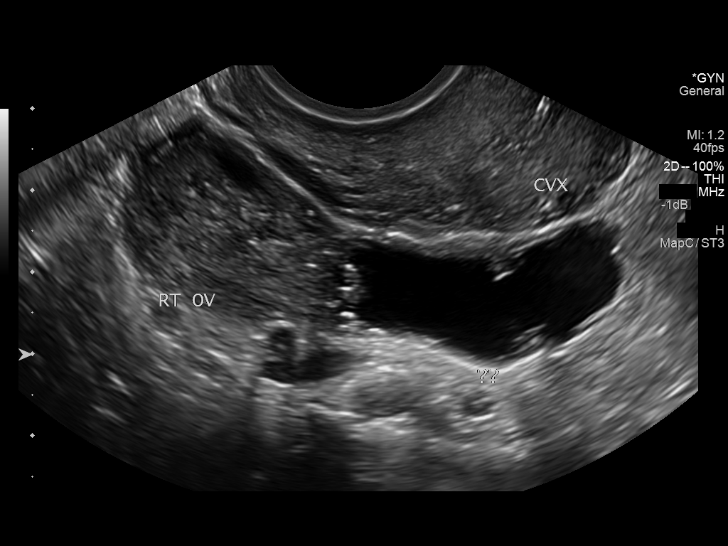

[13 of 25 positions shown; findings below may reference images not displayed]

FINDINGS: Uterus

Measurements: 8.4 x 3.3 x 4.7 cm. No fibroids or other mass
visualized.

Endometrium

Thickness: 14 mm.  Thickened, hypervascular.

Right ovary

Measurements: 3.2 x 2.2 x 2.9 cm. Dilated tubular structure adjacent
to the right ovary measuring 3.5 x 1.3 x 2.3 cm, possibly reflecting
a hydrosalpinx.

Left ovary

Measurements: 3.7 x 2.4 x 2.9 cm. Dominant 1.6 cm cyst/ follicle.

Other findings

No free fluid.
IMPRESSION: Thickened, hypervascular endometrium, measuring 14 mm. Although
nonspecific, this appearance raises the possibility of an
endometrial polyp. Consider hysteroscopy or hysterosalpingogram as
clinically warranted.

Dilated tubular structure adjacent to the right ovary, favored to
reflect a hydrosalpinx.

## 2014-01-28 NOTE — Telephone Encounter (Signed)
Left message labs good except HGB a little low ,take MV with iron,kepp appt

## 2014-02-04 ENCOUNTER — Telehealth: Payer: Self-pay | Admitting: Adult Health

## 2014-02-04 ENCOUNTER — Other Ambulatory Visit: Payer: Self-pay | Admitting: Adult Health

## 2014-02-04 NOTE — Telephone Encounter (Signed)
Pt over slept and missed appt today is rescheduled for 1/6 at 3:15 pm, she is aware of [place] results and probable polyp and hydrosalpinx,( may need D&C )or endo biopsy first

## 2014-02-11 ENCOUNTER — Other Ambulatory Visit (HOSPITAL_COMMUNITY)
Admission: RE | Admit: 2014-02-11 | Discharge: 2014-02-11 | Disposition: A | Discharge status: Home / Self Care | Payer: Self-pay | Source: Ambulatory Visit | Attending: Adult Health | Admitting: Adult Health

## 2014-02-11 ENCOUNTER — Ambulatory Visit (INDEPENDENT_AMBULATORY_CARE_PROVIDER_SITE_OTHER): Payer: Self-pay | Admitting: Adult Health

## 2014-02-11 ENCOUNTER — Encounter: Payer: Self-pay | Admitting: Adult Health

## 2014-02-11 VITALS — BP 102/60 | Ht 63.0 in | Wt 210.5 lb

## 2014-02-11 DIAGNOSIS — R87810 Cervical high risk human papillomavirus (HPV) DNA test positive: Secondary | ICD-10-CM | POA: Insufficient documentation

## 2014-02-11 DIAGNOSIS — Z1151 Encounter for screening for human papillomavirus (HPV): Secondary | ICD-10-CM | POA: Insufficient documentation

## 2014-02-11 DIAGNOSIS — N840 Polyp of corpus uteri: Secondary | ICD-10-CM

## 2014-02-11 DIAGNOSIS — Z01411 Encounter for gynecological examination (general) (routine) with abnormal findings: Secondary | ICD-10-CM | POA: Insufficient documentation

## 2014-02-11 DIAGNOSIS — Z01419 Encounter for gynecological examination (general) (routine) without abnormal findings: Secondary | ICD-10-CM

## 2014-02-11 DIAGNOSIS — K625 Hemorrhage of anus and rectum: Secondary | ICD-10-CM

## 2014-02-11 DIAGNOSIS — N7011 Chronic salpingitis: Secondary | ICD-10-CM | POA: Insufficient documentation

## 2014-02-11 DIAGNOSIS — R102 Pelvic and perineal pain: Secondary | ICD-10-CM

## 2014-02-11 DIAGNOSIS — Z8742 Personal history of other diseases of the female genital tract: Secondary | ICD-10-CM

## 2014-02-11 DIAGNOSIS — N920 Excessive and frequent menstruation with regular cycle: Secondary | ICD-10-CM

## 2014-02-11 HISTORY — DX: Polyp of corpus uteri: N84.0

## 2014-02-11 HISTORY — DX: Personal history of other diseases of the female genital tract: Z87.42

## 2014-02-11 NOTE — Patient Instructions (Signed)
Call Dr Oneida Alar to reschedule appt Return to see Dr Glo Herring in 1 week to talk surgical options

## 2014-02-11 NOTE — Progress Notes (Signed)
Patient ID: Meghan Carter, female   DOB: [birth year], 34 y.o.   MRN: [id number] History of Present Illness: Meghan Carter is a  34 year old black female in for pap and physical.She was first seen in December. She is on period today and still complains of rectal bleeding, has appt with Dr Oneida Alar.(but she needs to change the time)   Current Medications, Allergies, Past Medical History, Past Surgical History, Family History and Social History were reviewed in [employer] record.     Review of Systems: Patient denies any headaches, blurred vision, shortness of breath, chest pain, abdominal pain, problems with urination, or intercourse,except can't have orgasm. No joint pain or mood swings, has some constipation at times and has heavy periods and pelvic pain R>L, she had [place] and it showed ? Endometrial polyp and right hydrosalpinx.    Physical Exam:BP 102/60 mmHg  Ht 5\' 3"  (1.6 m)  Wt 210 lb 8 oz (95.482 kg)  BMI 37.30 kg/m2  LMP 02/09/2014 General:  Well developed, well nourished, no acute distress Skin:  Warm and dry Neck:  Midline trachea, normal thyroid Lungs; Clear to auscultation bilaterally Breast:  No dominant palpable mass, retraction, or nipple discharge Cardiovascular: Regular rate and rhythm Abdomen:  Soft, non tender, no hepatosplenomegaly Pelvic:  External genitalia is normal in appearance.  The vagina is normal in appearance, with period like blood.       The cervix is smooth, pap with HPV performed.  Uterus is felt to be normal size, shape, and contour.  No   adnexal masses or tenderness noted. Extremities:  No swelling or varicosities noted Psych:  No mood changes,alert and cooperative,seems happy but concerned   Impression: Well woman exam with pap Pelvic pain  Menorrhagia Endometrial polyp Hydrosalpinx  Rectal bleeding History of abnormal pap    Plan: Return in 1 week to talk with Dr Glo Herring about surgical options Call Dr Oneida Alar to change appt  time Physical in 1 year

## 2014-02-13 LAB — CYTOLOGY - PAP

## 2014-02-16 ENCOUNTER — Telehealth: Payer: Self-pay | Admitting: Adult Health

## 2014-02-16 NOTE — Telephone Encounter (Signed)
Left message about pap being abnormal will perform colpo 1/12 at 2:15 pm when seeing Dr Glo Herring

## 2014-02-17 ENCOUNTER — Ambulatory Visit: Payer: Self-pay | Admitting: Obstetrics and Gynecology

## 2014-02-17 ENCOUNTER — Encounter: Payer: Self-pay | Admitting: Obstetrics and Gynecology

## 2014-02-17 VITALS — BP 120/70 | Ht 63.0 in | Wt 216.0 lb

## 2014-02-17 DIAGNOSIS — N840 Polyp of corpus uteri: Secondary | ICD-10-CM

## 2014-02-17 DIAGNOSIS — N7011 Chronic salpingitis: Secondary | ICD-10-CM

## 2014-02-17 NOTE — Progress Notes (Signed)
Patient ID: Meghan Carter, female   DOB: [birth year], 34 y.o.   MRN: [id number] Pt here today for Colpo and pre op lenghty discussion, much rambling in conversation. Here with partner Lengthy talk only : allegedly uncomfotable, bleeds per rectum, bleeds per vagina. Had abnormal pap, refuses colpo today.  pt with endometrial poly p on u/s and hydrosalpinx. Pt desires a future pregnancy, has ? Of need for left   Hydrosalpinx removal Pt to bring a specific list of top 5 questions to next visit  Suspected hydrosalpinx   Abnormal pap. Rectal bleeding Plan: pt to keep appt with GI specialist.          F/u prn

## 2014-02-25 ENCOUNTER — Telehealth: Payer: Self-pay | Admitting: Adult Health

## 2014-02-25 MED ORDER — IBUPROFEN 800 MG PO TABS: 800.0000 mg | ORAL_TABLET | Freq: Three times a day (TID) | ORAL | Status: DC | PRN

## 2014-02-25 NOTE — Telephone Encounter (Signed)
Will rx motrin

## 2014-02-25 NOTE — Telephone Encounter (Signed)
Spoke with pt. Pt states she is having low abd pain, more in the right side. Pt has been on Ibuprofen 600 mg in the past but is out. She has an appt 1/26 but wants to know if you will order her a refill. Appt was offered today, but pt unable to come. Advised if she needs to be seen sooner, call us back. Pt voiced understanding. [place]

## 2014-03-03 ENCOUNTER — Ambulatory Visit: Payer: Self-pay | Admitting: Obstetrics and Gynecology

## 2014-03-03 ENCOUNTER — Telehealth: Payer: Self-pay | Admitting: *Deleted

## 2014-03-09 ENCOUNTER — Ambulatory Visit (INDEPENDENT_AMBULATORY_CARE_PROVIDER_SITE_OTHER): Payer: Self-pay | Admitting: Obstetrics and Gynecology

## 2014-03-09 ENCOUNTER — Encounter: Payer: Self-pay | Admitting: Obstetrics and Gynecology

## 2014-03-09 VITALS — BP 120/72 | Ht 63.0 in | Wt 214.0 lb

## 2014-03-09 DIAGNOSIS — N840 Polyp of corpus uteri: Secondary | ICD-10-CM

## 2014-03-09 NOTE — Progress Notes (Signed)
Patient ID: Meghan Carter, female   DOB: 07-Nov-1980, 35 y.o.   MRN: [id number]   [facility] Visit  Patient name: Meghan Carter MRN [id number]  Date of birth: [birth year]  CC & HPI:  Meghan Carter is a 34 y.o. female G1P0010 with a history of PID and s/p ectopic pregnancy surgery and unilateral salpingectomy presenting today stating that she feels like something is "connecting" on the right side of her abdomen.  When she wipes in her rectum after a bowel movement she feel something in the right side of her abdomen.  Her last bowel movement was earlier today.    She has been noticing some rectal bleeding while on her menses.  Her menses started January 29.  She is also complaining of cloudy urine in the toilet bowl.  She only notices these symptoms once weekly.  She states that she will get up 5 times a night to urinate.  This occurs every night.  She does not urinate as often during the day.  She lists urinary frequency as an associated symptom.  She denies dysuria as an associated symptom.      Ectopic pregnancy surgery was done at [facility].  She does not have a PCP and [facility]   ROS:  All systems have been reviewed and are negative unless otherwise indicated in the HPI.   Pertinent History Reviewed:   Reviewed: Significant for ectopic pregnancy surgery Medical         Past Medical History  Diagnosis Date  . Hydrosalpinx   . HSV (herpes simplex virus) infection   . Vaginal Pap smear, abnormal   . Pelvic pain in female 01/27/2014  . Menorrhagia with regular cycle 01/27/2014  . Rectal bleeding 01/27/2014  . Fecal occult blood test positive 01/27/2014  . Endometrial polyp 02/11/2014  . History of abnormal cervical Pap smear 02/11/2014                              Surgical Hx:    Past Surgical History  Procedure Laterality Date  . Ectopic pregnancy surgery     Medications: Reviewed & Updated - see associated section           Current outpatient prescriptions:  .  ibuprofen (ADVIL,MOTRIN) 800 MG tablet, Take 1 tablet (800 mg total) by mouth every 8 (eight) hours as needed., Disp: 60 tablet, Rfl: 1   Social History: Reviewed -  reports that she has been smoking Cigarettes.  She has a 5.5 pack-year smoking history. She has never used smokeless tobacco.  Objective Findings:  Vitals: Blood pressure 120/72, height 5\' 3"  (1.6 m), weight 214 lb (97.07 kg), last menstrual period 02/09/2014.  Physical Examination: General appearance - alert, well appearing, and in no distress, oriented to person, place, and time and overweight Pelvic - VULVA: normal appearing vulva with no masses, tenderness or lesions,  VAGINA: normal appearing vagina with normal color and discharge, no lesions, normal, heavy menstrual flow with small clots, good support CERVIX: normal appearing cervix without discharge or lesions, nonpurulent  UTERUS: uterus is normal size, shape, consistency and nontender,  ADNEXA: normal adnexa in size, nontender and no masses Rectal - normal rectal, no masses, good support, no blood present   Assessment & Plan:   A:  1. Confusing clinical history based on patient's history/HPI 2. Normal menses 3. NO evidence of rectal bleeding 4. Prior hx of  u/s suggesting endometrial polyp 5. Probable hydrosalpinx on right 6   Hx partial salpingectomy on left due to ectopic pregnancy  P:  1. Reassure pt of normalcy of exam, and review normal anatomic relationships 2. Pt to keep appt with GI  03/19/14 3. Will delay any further gyn eval til after GI eval. After that, pt might be a candidate for sonohysterogram   This chart was scribed for Jonnie Kind, MD by Donato Schultz, ED Scribe. The patient's care was started at 4:04 PM.

## 2014-03-09 NOTE — Progress Notes (Deleted)
Patient ID: Meghan Carter, female   DOB: 11-10-1980, 34 y.o.   MRN: [id number]   [facility]  Patient name: Meghan Carter MRN [id number]  Date of birth: 10-03-1980  CC & HPI:  Meghan Carter is a 34 y.o. female G1P0010 with a history of PID and s/p ectopic pregnancy surgery and unilateral salpingectomy presenting today stating that she feels like something is "connecting" on the right side of her abdomen.  When she wipes in her rectum after a bowel movement she feel something in the right side of her abdomen.  Her last bowel movement was earlier today.    She has been noticing some rectal bleeding while on her menses.  Her menses started January 29.  She is also complaining of cloudy urine in the toilet bowl.  She only notices these symptoms once weekly.  She states that she will get up 5 times a night to urinate.  This occurs every night.  She does not urinate as often during the day.  She lists urinary frequency as an associated symptom.  She denies dysuria as an associated symptom.      Ectopic pregnancy surgery was done at [facility].  She does not have a PCP and [facility]   ROS:  All systems have been reviewed and are negative unless otherwise indicated in the HPI.   Pertinent History Reviewed:   Reviewed: Significant for ectopic pregnancy surgery Medical         Past Medical History  Diagnosis Date   Hydrosalpinx    HSV (herpes simplex virus) infection    Vaginal Pap smear, abnormal    Pelvic pain in female 01/27/2014   Menorrhagia with regular cycle 01/27/2014   Rectal bleeding 01/27/2014   Fecal occult blood test positive 01/27/2014   Endometrial polyp 02/11/2014   History of abnormal cervical Pap smear 02/11/2014                              Surgical Hx:    Past Surgical History  Procedure Laterality Date   Ectopic pregnancy surgery     Medications: Reviewed & Updated - see associated section           Current outpatient prescriptions:    ibuprofen (ADVIL,MOTRIN) 800 MG tablet, Take 1 tablet (800 mg total) by mouth every 8 (eight) hours as needed., Disp: 60 tablet, Rfl: 1   Social History: Reviewed -  reports that she has been smoking Cigarettes.  She has a 5.5 pack-year smoking history. She has never used smokeless tobacco.  Objective Findings:  Vitals: Blood pressure 120/72, height 5\' 3"  (1.6 m), weight 214 lb (97.07 kg), last menstrual period 02/09/2014.  Physical Examination: General appearance - alert, well appearing, and in no distress, oriented to person, place, and time and overweight Pelvic - VULVA: normal appearing vulva with no masses, tenderness or lesions,  VAGINA: normal appearing vagina with normal color and discharge, no lesions, normal, heavy menstrual flow with small clots, good support CERVIX: normal appearing cervix without discharge or lesions, nonpurulent  UTERUS: uterus is normal size, shape, consistency and nontender,  ADNEXA: normal adnexa in size, nontender and no masses Rectal - normal rectal, no masses, good support, no blood present   Assessment & Plan:   A:  1. Confusing clinical history based on patient's history/HPI 2. Normal menses 3. NO evidence of rectal bleeding 4. Prior hx of  u/s suggesting endometrial polyp 5. Probable hydrosalpinx on right 6   Hx partial salpingectomy on left due to ectopic pregnancy  P:  1. Reassure pt of normalcy of exam, and review normal anatomic relationships 2. Pt to keep appt with GI  03/19/14 3. Will delay any further gyn eval til after GI eval. After that, pt might be a candidate for sonohysterogram   This chart was scribed for Jonnie Kind, MD by Donato Schultz, ED Scribe. The patient's care was started at 4:04 PM.

## 2014-03-09 NOTE — Progress Notes (Signed)
Patient ID: Meghan Carter, female   DOB: 1980-07-11, 34 y.o.   MRN: [id number] Pt here today for follow up visit. Pt state sthat since getting the 800mg  Ibuprofen that she has had less pain.

## 2014-03-10 ENCOUNTER — Ambulatory Visit: Payer: Self-pay | Admitting: Gastroenterology

## 2014-03-19 ENCOUNTER — Encounter: Payer: Self-pay | Admitting: Gastroenterology

## 2014-03-19 ENCOUNTER — Other Ambulatory Visit: Payer: Self-pay

## 2014-03-19 ENCOUNTER — Ambulatory Visit (INDEPENDENT_AMBULATORY_CARE_PROVIDER_SITE_OTHER): Payer: Self-pay | Admitting: Gastroenterology

## 2014-03-19 VITALS — BP 135/77 | HR 114 | Temp 97.8°F | Ht 63.0 in | Wt 218.2 lb

## 2014-03-19 DIAGNOSIS — K625 Hemorrhage of anus and rectum: Secondary | ICD-10-CM

## 2014-03-19 DIAGNOSIS — K5900 Constipation, unspecified: Secondary | ICD-10-CM

## 2014-03-19 DIAGNOSIS — K6289 Other specified diseases of anus and rectum: Secondary | ICD-10-CM

## 2014-03-19 DIAGNOSIS — R195 Other fecal abnormalities: Secondary | ICD-10-CM

## 2014-03-19 HISTORY — DX: Other specified diseases of anus and rectum: K62.89

## 2014-03-19 MED ORDER — PEG 3350-KCL-NA BICARB-NACL 420 G PO SOLR: 4000.0000 mL | Freq: Once | ORAL | Status: DC

## 2014-03-19 NOTE — Progress Notes (Signed)
Primary Care Physician:  No PCP Per Patient  Primary Gastroenterologist:  Barney Drain, MD   Chief Complaint  Patient presents with  . Rectal Bleeding  . Rectal Pain    HPI:  Meghan Carter is a 34 y.o. female here for further evaluation of rectal bleeding at the request of Dr. Glo Herring. She has had rectal bleeding for several years. Seems to be getting worse. Blood clots, fresh to dark blood. Pain in rectum with BM. Pain seems to radiate to the right hip. ?hemorrhoids that come out. Chronic constipation. Takes MOM couple of times per month. No prior colonoscopy.h/o mild normocytic anemia.  Menstrual cycles irregular. Some heavy bleeding. Vaginal bleeding sometimes twice monthly. Occasional heartburn, gas x. No dysphagia, vomiting. Positive NSAID use.    Current Outpatient Prescriptions  Medication Sig Dispense Refill  . ibuprofen (ADVIL,MOTRIN) 800 MG tablet Take 1 tablet (800 mg total) by mouth every 8 (eight) hours as needed. 60 tablet 1   No current facility-administered medications for this visit.    Allergies as of 03/19/2014  . (No Known Allergies)    Past Medical History  Diagnosis Date  . Hydrosalpinx   . HSV (herpes simplex virus) infection   . Vaginal Pap smear, abnormal   . Pelvic pain in female 01/27/2014  . Menorrhagia with regular cycle 01/27/2014  . Rectal bleeding 01/27/2014  . Fecal occult blood test positive 01/27/2014  . Endometrial polyp 02/11/2014  . History of abnormal cervical Pap smear 02/11/2014    Past Surgical History  Procedure Laterality Date  . Ectopic pregnancy surgery      Family History  Problem Relation Age of Onset  . Diabetes Father   . Diabetes Paternal Grandmother   . Diabetes Paternal Grandfather   . Colon cancer Neg Hx   . Inflammatory bowel disease Neg Hx     History   Social History  . Marital Status: Married    Spouse Name: N/A  . Number of Children: N/A  . Years of Education: N/A   Occupational History  . Not on  file.   Social History Main Topics  . Smoking status: Current Some Day Smoker -- 0.25 packs/day for 22 years    Types: Cigarettes  . Smokeless tobacco: Never Used  . Alcohol Use: Yes     Comment: occ  . Drug Use: No  . Sexual Activity: Not Currently    Birth Control/ Protection: None   Other Topics Concern  . Not on file   Social History Narrative      ROS:  General: Negative for anorexia, weight loss, fever, chills, fatigue, weakness. Eyes: Negative for vision changes.  ENT: Negative for hoarseness, difficulty swallowing , nasal congestion. CV: Negative for chest pain, angina, palpitations, dyspnea on exertion, peripheral edema.  Respiratory: Negative for dyspnea at rest, dyspnea on exertion, cough, sputum, wheezing.  GI: See history of present illness. GU:  Negative for dysuria, hematuria, urinary incontinence, urinary frequency, nocturnal urination.  MS: Negative for joint pain, low back pain.  Derm: Negative for rash or itching.  Neuro: Negative for weakness, abnormal sensation, seizure, frequent headaches, memory loss, confusion.  Psych: Negative for anxiety, depression, suicidal ideation, hallucinations.  Endo: Negative for unusual weight change.  Heme: Negative for bruising or bleeding. Allergy: Negative for rash or hives.    Physical Examination:  BP 135/77 mmHg  Pulse 114  Temp(Src) 97.8 F (36.6 C) (Oral)  Ht 5\' 3"  (1.6 m)  Wt 218 lb 3.2 oz (98.975 kg)  BMI 38.66  kg/m2  LMP 03/05/2014   General: Well-nourished, well-developed in no acute distress.  Head: Normocephalic, atraumatic.   Eyes: Conjunctiva pink, no icterus. Mouth: Oropharyngeal mucosa moist and pink , no lesions erythema or exudate. Neck: Supple without thyromegaly, masses, or lymphadenopathy.  Lungs: Clear to auscultation bilaterally.  Heart: Regular rate and rhythm, no murmurs rubs or gallops.  Abdomen: Bowel sounds are normal, nontender, nondistended, no hepatosplenomegaly or masses, no  abdominal bruits or    hernia , no rebound or guarding.   Rectal: deferred until colonoscopy Extremities: No lower extremity edema. No clubbing or deformities.  Neuro: Alert and oriented x 4 , grossly normal neurologically.  Skin: Warm and dry, no rash or jaundice.   Psych: Alert and cooperative, normal mood and affect.  Labs: Lab Results  Component Value Date   WBC 5.7 01/27/2014   HGB 11.4* 01/27/2014   HCT 35.0* 01/27/2014   MCV 78.0 01/27/2014   PLT 356 01/27/2014   Lab Results  Component Value Date   CREATININE 0.70 01/27/2014   BUN 11 01/27/2014   NA 138 01/27/2014   K 4.2 01/27/2014   CL 102 01/27/2014   CO2 26 01/27/2014   Lab Results  Component Value Date   ALT <8 01/27/2014   AST 12 01/27/2014   ALKPHOS 60 01/27/2014   BILITOT 0.3 01/27/2014   Lab Results  Component Value Date   TSH 1.892 01/27/2014     Imaging Studies: No results found.

## 2014-03-19 NOTE — Patient Instructions (Signed)
1. Colonoscopy with Dr. Oneida Alar as scheduled. See separate instructions.  2. Start Miralax once daily for constipation. Samples provided. You can buy over the counter.

## 2014-03-22 ENCOUNTER — Encounter: Payer: Self-pay | Admitting: Gastroenterology

## 2014-03-22 NOTE — Assessment & Plan Note (Signed)
34 y/o female with chronic worsening rectal bleeding, rectal pain in setting of mild constipation. Complains of prolapsing hemorrhoids. No prior colonoscopy. Suspect benign anorectal bleeding from hemorrhoids but cannot exclude fissure, IBD, malignancy. Recommend colonoscopy for further evaluation.  I have discussed the risks, alternatives, benefits with regards to but not limited to the risk of reaction to medication, bleeding, infection, perforation and the patient is agreeable to proceed. Written consent to be obtained.  Add miralax daily.

## 2014-03-23 ENCOUNTER — Encounter (HOSPITAL_COMMUNITY)
Admission: RE | Disposition: A | Discharge status: Home / Self Care | Payer: Self-pay | Source: Ambulatory Visit | Attending: Gastroenterology

## 2014-03-23 ENCOUNTER — Encounter (HOSPITAL_COMMUNITY): Payer: Self-pay | Admitting: *Deleted

## 2014-03-23 ENCOUNTER — Ambulatory Visit (HOSPITAL_COMMUNITY)
Admission: RE | Admit: 2014-03-23 | Discharge: 2014-03-23 | Disposition: A | Discharge status: Home / Self Care | Payer: Self-pay | Source: Ambulatory Visit | Attending: Gastroenterology | Admitting: Gastroenterology

## 2014-03-23 DIAGNOSIS — K5900 Constipation, unspecified: Secondary | ICD-10-CM

## 2014-03-23 DIAGNOSIS — K648 Other hemorrhoids: Secondary | ICD-10-CM | POA: Insufficient documentation

## 2014-03-23 DIAGNOSIS — K625 Hemorrhage of anus and rectum: Secondary | ICD-10-CM

## 2014-03-23 DIAGNOSIS — R195 Other fecal abnormalities: Secondary | ICD-10-CM

## 2014-03-23 DIAGNOSIS — K6289 Other specified diseases of anus and rectum: Secondary | ICD-10-CM

## 2014-03-23 DIAGNOSIS — K649 Unspecified hemorrhoids: Secondary | ICD-10-CM

## 2014-03-23 DIAGNOSIS — F17210 Nicotine dependence, cigarettes, uncomplicated: Secondary | ICD-10-CM | POA: Insufficient documentation

## 2014-03-23 DIAGNOSIS — K6389 Other specified diseases of intestine: Secondary | ICD-10-CM | POA: Insufficient documentation

## 2014-03-23 HISTORY — PX: COLONOSCOPY: SHX5424

## 2014-03-23 HISTORY — PX: HEMORRHOID BANDING: SHX5850

## 2014-03-23 SURGERY — COLONOSCOPY
Anesthesia: Moderate Sedation

## 2014-03-23 MED ORDER — LIDOCAINE HCL 2 % EX GEL
CUTANEOUS | Status: AC
  Filled 2014-03-23: qty 30

## 2014-03-23 MED ORDER — MIDAZOLAM HCL 5 MG/5ML IJ SOLN
INTRAMUSCULAR | Status: DC | PRN
  Administered 2014-03-23: 2 mg via INTRAVENOUS
  Administered 2014-03-23: 1 mg via INTRAVENOUS
  Administered 2014-03-23: 2 mg via INTRAVENOUS
  Administered 2014-03-23: 1 mg via INTRAVENOUS
  Administered 2014-03-23: 2 mg via INTRAVENOUS

## 2014-03-23 MED ORDER — STERILE WATER FOR IRRIGATION IR SOLN
Status: DC | PRN
  Administered 2014-03-23: 10:00:00

## 2014-03-23 MED ORDER — LIDOCAINE HCL 2 % EX GEL
CUTANEOUS | Status: DC | PRN
  Administered 2014-03-23: 1 via TOPICAL
  Administered 2014-03-23: 2 via TOPICAL

## 2014-03-23 MED ORDER — MEPERIDINE HCL 100 MG/ML IJ SOLN
INTRAMUSCULAR | Status: AC
  Filled 2014-03-23: qty 2

## 2014-03-23 MED ORDER — MEPERIDINE HCL 100 MG/ML IJ SOLN
INTRAMUSCULAR | Status: DC | PRN
  Administered 2014-03-23: 25 mg via INTRAVENOUS
  Administered 2014-03-23: 50 mg via INTRAVENOUS
  Administered 2014-03-23: 25 mg via INTRAVENOUS

## 2014-03-23 MED ORDER — MIDAZOLAM HCL 5 MG/5ML IJ SOLN
INTRAMUSCULAR | Status: DC
Start: 2014-03-23 — End: 2014-03-23
  Filled 2014-03-23: qty 10

## 2014-03-23 MED ORDER — HYDROCORTISONE ACE-PRAMOXINE 1-1 % RE CREA: TOPICAL_CREAM | RECTAL | Status: DC

## 2014-03-23 MED ORDER — SODIUM CHLORIDE 0.9 % IV SOLN
INTRAVENOUS | Status: DC
  Administered 2014-03-23: 1000 mL via INTRAVENOUS

## 2014-03-23 MED ORDER — HYDROCODONE-ACETAMINOPHEN 5-325 MG PO TABS: ORAL_TABLET | ORAL | Status: DC

## 2014-03-23 NOTE — Discharge Instructions (Signed)
YOUR RECTAL BLEEDING AND PAIN ARE DUE TO internal AND EXTERNAL hemorrhoids. YOU DID NOT HAVE ANY POLYPS. I PLACED 3 BAND TO TREAT YOUR INTERNAL HEMORRHOIDAL BLEEDING AND PAIN. YOU MAY STILL HAVE RECTAL PAIN DUE TO EXTERNAL HEMORRHOIDS. YOU MAY SOME MILD BLEEDING OVER THE NEXT 3 TO 5 DAYS.   CALL [phone] IF YOU HAVE A FEVER, A LARGE AMOUNT OF BLEEDING, OR DIFFICULTY URINATING.  AVOID CONSTIPATION. CONTINUE MILK OF MAGNESIA.  DRINK WATER TO KEEP URINE LIGHT YELLOW.  YOU MAY USE NAPROXEN TWICE DAILY FOR RECTAL DISCOMFORT. TYLENOL OR VICODIN AS NEED FOR ADDITIONAL PAIN RELIEF.  YOU MAY USE COLACE TWICE DAILY TO SOFTEN STOOL.  FOLLOW A LOW RESIDUE DIET FOR THE NEXT 2-3 WEEKS. SEE INFO BELOW.  FOLLOW UP IN 3 WEEKS.  Next colonoscopy in 10 years.    Colonoscopy Care After Read the instructions outlined below and refer to this sheet in the next week. These discharge instructions provide you with general information on caring for yourself after you leave the hospital. While your treatment has been planned according to the most current medical practices available, unavoidable complications occasionally occur. If you have any problems or questions after discharge, call DR. Aleisha Paone, [phone].  ACTIVITY  You may resume your regular activity, but move at a slower pace for the next 24 hours.   Take frequent rest periods for the next 24 hours.   Walking will help get rid of the air and reduce the bloated feeling in your belly (abdomen).   No driving for 24 hours (because of the medicine (anesthesia) used during the test).   You may shower.   Do not sign any important legal documents or operate any machinery for 24 hours (because of the anesthesia used during the test).    NUTRITION  Drink plenty of fluids.   You may resume your normal diet as instructed by your doctor.   Begin with a light meal and progress to your normal diet. Heavy or fried foods are harder to digest and may make  you feel sick to your stomach (nauseated).   Avoid alcoholic beverages for 24 hours or as instructed.    MEDICATIONS  You may resume your normal medications.   WHAT YOU CAN EXPECT TODAY  Some feelings of bloating in the abdomen.   Passage of more gas than usual.   Spotting of blood in your stool or on the toilet paper  .  IF YOU HAD POLYPS REMOVED DURING THE COLONOSCOPY:  Eat a soft diet IF YOU HAVE NAUSEA, BLOATING, ABDOMINAL PAIN, OR VOMITING.    FINDING OUT THE RESULTS OF YOUR TEST Not all test results are available during your visit. DR. Oneida Alar WILL CALL YOU WITHIN 7 DAYS OF YOUR PROCEDUE WITH YOUR RESULTS. Do not assume everything is normal if you have not heard from DR. Casson Catena IN ONE WEEK, CALL HER OFFICE AT [phone].  SEEK IMMEDIATE MEDICAL ATTENTION AND CALL THE OFFICE: [phone] IF:  You have more than a spotting of blood in your stool.   Your belly is swollen (abdominal distention).   You are nauseated or vomiting.   You have a temperature over 101F.   You have abdominal pain or discomfort that is severe or gets worse throughout the day.   Hemorrhoids Hemorrhoids are dilated (enlarged) veins around the rectum. Sometimes clots will form in the veins. This makes them swollen and painful. These are called thrombosed hemorrhoids. Causes of hemorrhoids include:  Constipation.   Straining to have a bowel movement.  HEAVY LIFTING HOME CARE INSTRUCTIONS  Eat a well balanced diet and drink 6 to 8 glasses of water every day to avoid constipation. You may also use a bulk laxative.   Avoid straining to have bowel movements.   Keep anal area dry and clean.   Do not use a donut shaped pillow or sit on the toilet for long periods. This increases blood pooling and pain.   Move your bowels when your body has the urge; this will require less straining and will decrease pain and pressure.  LOW RESIDUE DIET  A low-residue diet, aka low-fiber diet, is  usually recommended. An intake of less than 10 grams of fiber per day is generally considered a low-residue diet.  LOW RESIDUE Diet  Grain Products: enriched refined white bread, buns, bagels, English muffins  plain cereals e.g. Cheerios, Cornflakes, Cream of Wheat, Rice Krispies, Special K  arrowroot cookies, tea biscuits, soda crackers, plain melba toast  white rice, refined pasta and noodles  avoid whole grains   Fruits: fruit juices except prune juice  applesauce, apricots, banana (1/2), cantaloupe, canned fruit cocktail, grapes, honeydew melon, peaches, watermelon  avoid raw and dried fruits, and berries.   Vegetables: vegetable juices  potatoes (no Skin)  alfalfa sprouts, beets, green/yellow beans, carrots, celery, cucumber, eggplant, lettuce, mushrooms, green/red peppers, potatoes (peeled), squash, zucchini  avoid vegetables from the cruciferous family such as broccoli, cauliflower, brussels sprouts, cabbage, kale, Swiss chard, onion, etc   Meat and Protein Choice: well-cooked, tender meat, fish and eggs  avoid beans  avoid all nuts and seeds, as well as foods that may contain seeds (such as yogurt)   Dairy: NO RESTRICTIONS   Drinks: juices  tea and coffee   avoid alcohol   HEMORRHOIDAL BANDING COMPLICATIONS:  COMMON: 1. MINOR PAIN  UNCOMMON: 1. ABSCESS 2. BAND FALLS OFF 3. PROLAPSE OF HEMORRHOIDS AND PAIN 4. ULCER BLEEDING  A. USUALLY SELF-LIMITED: MAY LAST 3-5 DAYS  B. MAY REQUIRE INTERVENTION: 1-2 WEEKS AFTER INTERACTIONS 5. NECROTIZING PELVIC SEPSIS  A. SYMPTOMS: FEVER, PAIN, DIFFICULTY URINATING

## 2014-03-23 NOTE — H&P (Signed)
  Primary Care Physician:  Jonnie Kind, MD Primary Gastroenterologist:  Dr. Oneida Alar  Pre-Procedure History & Physical: HPI:  Meghan Carter is a 34 y.o. female here for  BRBPR/abdominal pain.  Past Medical History  Diagnosis Date  . Hydrosalpinx   . HSV (herpes simplex virus) infection   . Vaginal Pap smear, abnormal   . Pelvic pain in female 01/27/2014  . Menorrhagia with regular cycle 01/27/2014  . Rectal bleeding 01/27/2014  . Fecal occult blood test positive 01/27/2014  . Endometrial polyp 02/11/2014  . History of abnormal cervical Pap smear 02/11/2014    Past Surgical History  Procedure Laterality Date  . Ectopic pregnancy surgery      Prior to Admission medications   Medication Sig Start Date End Date Taking? Authorizing Provider  ibuprofen (ADVIL,MOTRIN) 800 MG tablet Take 1 tablet (800 mg total) by mouth every 8 (eight) hours as needed. 02/25/14  Yes Estill Dooms, NP  polyethylene glycol-electrolytes (NULYTELY/GOLYTELY) 420 G solution Take 4,000 mLs by mouth once. 03/19/14  Yes Mahala Menghini, PA-C    Allergies as of 03/19/2014  . (No Known Allergies)    Family History  Problem Relation Age of Onset  . Diabetes Father   . Diabetes Paternal Grandmother   . Diabetes Paternal Grandfather   . Colon cancer Neg Hx   . Inflammatory bowel disease Neg Hx     History   Social History  . Marital Status: Married    Spouse Name: N/A  . Number of Children: N/A  . Years of Education: N/A   Occupational History  . Not on file.   Social History Main Topics  . Smoking status: Current Some Day Smoker -- 0.25 packs/day for 22 years    Types: Cigarettes  . Smokeless tobacco: Never Used  . Alcohol Use: Yes     Comment: occ  . Drug Use: No  . Sexual Activity: Not Currently    Birth Control/ Protection: None   Other Topics Concern  . Not on file   Social History Narrative    Review of Systems: See HPI, otherwise negative ROS   Physical Exam: BP 124/83  mmHg  Pulse 107  Temp(Src) 98.9 F (37.2 C) (Oral)  Resp 20  LMP 03/05/2014 (Exact Date) General:   Alert,  pleasant and cooperative in NAD Head:  Normocephalic and atraumatic. Neck:  Supple; Lungs:  Clear throughout to auscultation.    Heart:  Regular rate and rhythm. Abdomen:  Soft, nontender and nondistended. Normal bowel sounds, without guarding, and without rebound.   Neurologic:  Alert and  oriented x4;  grossly normal neurologically.  Impression/Plan:    BRBPR  PLAN: TCS TODAY

## 2014-03-23 NOTE — Op Note (Signed)
[place], [address]   COLONOSCOPY PROCEDURE REPORT  PATIENT: Meghan Carter, Meghan Carter  MR#: [id number] BIRTHDATE: 08-11-1980 , 34  yrs. old GENDER: female ENDOSCOPIST: Danie Binder, MD REFERRED PP:IRJJ Glo Herring, M.D. PROCEDURE DATE:  03-27-14 PROCEDURE:   Colonoscopy, diagnostic and Hemorrhoidectomy via banding, clips or ligation INDICATIONS:RECTAL BLEEDING/PAIN. MEDICATIONS: Demerol 100 mg IV and Versed 7 mg IV  DESCRIPTION OF PROCEDURE:    Physical exam was performed.  Informed consent was obtained from the patient after explaining the benefits, risks, and alternatives to procedure.  The patient was connected to monitor and placed in left lateral position. Continuous oxygen was provided by nasal cannula and IV medicine administered through an indwelling cannula.  After administration of sedation and rectal exam, the patients rectum was intubated and the EC-[id number] ([id number])  colonoscope was advanced under direct visualization to the ileum.  The scope was removed slowly by carefully examining the color, texture, anatomy, and integrity mucosa on the way out.  The patient was recovered in endoscopy and discharged home in satisfactory condition.    COLON FINDINGS: The examined terminal ileum appeared to be normal. , The colon was redundant.  Manual abdominal counter-pressure was used to reach the cecum, and Moderate sized internal hemorrhoids were found.  PREP QUALITY: good. CECAL W/D TIME: 13       minutes COMPLICATIONS: None  ENDOSCOPIC IMPRESSION: 1.   The examined terminal ileum appeared to be normal 2.   The LEFT colon IS redundant 3.   Moderate sized internal hemorrhoids  RECOMMENDATIONS: OPV IN 3 WEEKS LOW RESIDUE DIET FOR 2-3 WEEKS TYLENOL OR VICODIN OR NAPROXEN FOR PAIN PT HAS A HIGH LEVEL OF ANXIETY, CONSIDER FLEX SIG/IH BANDING IF SX NOT COMPLETELY RESOLVED      _______________________________ eSignedDanie Binder, MD 03/27/2014 2:27 PM   CPT CODES: ICD CODES:  The ICD and CPT codes recommended by this software are interpretations from the data that the clinical staff has captured with the software.  The verification of the translation of this report to the ICD and CPT codes and modifiers is the sole responsibility of the health care institution and practicing physician where this report was generated.  [employer] will not be held responsible for the validity of the ICD and CPT codes included on this report.  AMA assumes no liability for data contained or not contained herein. CPT is a Designer, television/film set of the [employer].

## 2014-03-24 ENCOUNTER — Encounter: Payer: Self-pay | Admitting: Gastroenterology

## 2014-03-24 ENCOUNTER — Telehealth: Payer: Self-pay

## 2014-03-24 NOTE — Telephone Encounter (Signed)
PLEASE CALL PT. SHE MAY USE IBUPROFEN. SHE CAN TAKE HALF THE NORCO PILL TO EASE PAIN AND AVOID DROWSINESS. CONTINUE THE PROCTOSOL CREAM QID.

## 2014-03-24 NOTE — Telephone Encounter (Signed)
REVIEWED. AGREE. NO ADDITIONAL RECOMMENDATIONS. 

## 2014-03-24 NOTE — Telephone Encounter (Signed)
Per Dr. Oneida Alar, [place] to give note for today. Pt is aware and note is at front for her to pick up.

## 2014-03-24 NOTE — Telephone Encounter (Signed)
Pt called and said she had a colonoscopy and hemorrhoid banding yesterday. She is having a lot of rectal pain and is very uncomfortable. She had to take the Norco and it makes her so sleepy. She wants to know if it is OK to go back on the Ibuprofen 800 mg. She said that one hemorrhoid band has come off and she is afraid to have a BM for fear another one will fall off. I told her it was normal for them to fall off and some stay on a little longer, but she should not hold back a BM. Please advise!

## 2014-03-24 NOTE — Telephone Encounter (Signed)
Pt is aware and she needs a note for today.

## 2014-03-25 NOTE — Telephone Encounter (Signed)
Pt called and is coming by to pick up the note from yesterday. She got the VM about checking with OB-GYN doctor.  She said Thanks to Dr. Nona Dell so much for helping her problem. Now when she wipes, it feels normal.

## 2014-03-25 NOTE — Telephone Encounter (Signed)
Pt called and said 2 more blue rubber bands fell off this AM when she had a BM. She is still having rectal discomfort but it has improved, not as bad as yesterday. She is having lower back pain and right side pain and just too uncomfortable to sit for her two 50 min classes. She said she has GYN issues also and I told her her back and right side pain could be coming from that. She was just concerned because she had never had a colonoscopy and she didn't know if that could be the cause of it. She is not sure if she will try to go to class or not. Said when Dr. Oneida Alar lets [place] know her recommendations, ok to call her and if she is in class, she will return my phone call.  She would like to know something as soon as possible.

## 2014-03-25 NOTE — Telephone Encounter (Signed)
REVIEWED-NO ADDITIONAL RECOMMENDATIONS. 

## 2014-03-25 NOTE — Telephone Encounter (Signed)
Per Dr. Oneida Alar, if pt's rectal pain has improved, she should discuss the lower back and right side pain with her OB-GYN.  I called and LMOM for pt and told her if she has questions to please call.

## 2014-03-26 ENCOUNTER — Telehealth: Payer: Self-pay | Admitting: *Deleted

## 2014-03-26 ENCOUNTER — Telehealth: Payer: Self-pay | Admitting: Gastroenterology

## 2014-03-26 NOTE — Telephone Encounter (Signed)
Pt states that she was given a strong medication by the GI doctor and that she is falling asleep in class and can't stay focused because of the medication. I advised the pt that she would have to call Dr. Oneida Alar office and see if they could give her a note for school. Pt verbalized understanding.

## 2014-03-26 NOTE — Telephone Encounter (Signed)
Please call patient, she stated that the medication she was put on is making her unable to concentrate at school.

## 2014-03-27 ENCOUNTER — Encounter (HOSPITAL_COMMUNITY): Payer: Self-pay | Admitting: Gastroenterology

## 2014-03-27 NOTE — Telephone Encounter (Signed)
LMOM to call, we will be in the office til 12:00 noon today.

## 2014-03-30 ENCOUNTER — Telehealth: Payer: Self-pay | Admitting: Adult Health

## 2014-03-30 MED ORDER — HYDROCODONE-ACETAMINOPHEN 5-325 MG PO TABS: ORAL_TABLET | ORAL | Status: DC

## 2014-03-30 NOTE — Telephone Encounter (Signed)
Note has been faxed.

## 2014-03-30 NOTE — Telephone Encounter (Signed)
PT called and is complaining of rectal pain and discomfort from her colonoscopy and hemorrhoid banding . She said she has about 2 forced BM's daily and something comes out and she has bright red blood on the tissue. She has classes that last about 75 min and it is very difficult for her to sit in those classes.  She tried last week and it hurt so bad that she decided to take her Hydrocodone and go to class. Then she was so sleepy that she was advised to go to the school disability office and she talked to them about her problems. They got her records from [facility] and Dr. Oneida Alar and told her that the Hydrocodone could definitely make her feel sleepy and she should get a note from Dr. Oneida Alar explaining the necessity of taking this medicine. ( she also has only 4 tablets left and is requesting another prescription for the Hydrocodone) She continued talking and said she just did not know if she would ever let Dr. Oneida Alar touch her again, she has never heard of someone gong through all of this for the procedures. Said she just did not feel that Dr. Oneida Alar wanted to do her procedure anyway and she doesn't know what she will end up doing. She said she just wants to get better so she can go on with her life and her schooling.  I explained to her that I would send the note to Dr. Oneida Alar, but she is doing procedures at the hospital and I'm not sure what time I will hear back from her.  She said she has to have a note for her school today. Please advise!

## 2014-03-30 NOTE — Progress Notes (Signed)
No pcp per pt °

## 2014-03-30 NOTE — Telephone Encounter (Signed)
Had colonoscopy 2/15 by Dr Oneida Alar had banding then too, when has BM still bleeding with clots and hemorrhoids coming out, needs to call  Dr Oneida Alar back and ask for Magda Paganini to call her back.

## 2014-03-30 NOTE — Telephone Encounter (Addendum)
Spoke with patient. As per Tamela Oddi' note, drowsy on hydrocodone for pain, school has been made aware. She cannot sit for prolonged periods of time due to pain.  Needs a note from the doctor about the medication stating that she has been instructed to continue the medication (hydrocodone). Please provide a letter stating that she is still under our care and is taking pain medication which should be tapered off over the course of the next week or so. She will be calling with a fax number.   Over the weekend, having trouble going to the bathroom. Having to strain, blood per rectum with clots just like before. Has only seen two rubber bands come out. Feels pressure.   Still on Protcocream QID.  Took some Miralax X1 and some OTC laxatives, but not helping. Sits for 30 minutes to have BM.  I advised to start Linzess 117mcg daily. Please given #10 or 30 day voucher. Continue Proctocream QID. Use 1/2 tablet of hydrocodone while at school to reduce drowsiness. May use ibuprofen 600mg  TID as needed.  New RX for hydrocodone written and patient will pick up.  Patient worried about skin protruding from rectum and plans to take picture and send to me. She was very apologetic about her previous conversations with our office.

## 2014-03-30 NOTE — Telephone Encounter (Addendum)
Pt called back and said that she had talked with Anderson Malta at [facility] and she was told to call back here and ask for LSL to call her back. I told her that LSL was not here but I would send a phone note to her. She is upset that know one is wanting to help her. Please advise. Her call back number is [phone].

## 2014-03-30 NOTE — Telephone Encounter (Signed)
REVIEWED. AGREE. NO ADDITIONAL RECOMMENDATIONS. 

## 2014-03-30 NOTE — Addendum Note (Signed)
Addended by: Mahala Menghini on: 03/30/2014 02:39 PM   Modules accepted: Orders

## 2014-03-30 NOTE — Telephone Encounter (Signed)
Pt is aware the prescription is ready for pick up. The Linzess 145 mcg #12 given to take one tablet daily 30 min before breakfast.  She left the fax number of [phone] to fax the note to [facility].  I have enclosed a copy of that note in her bag for her.

## 2014-03-30 NOTE — Telephone Encounter (Signed)
Open in error

## 2014-03-30 NOTE — Telephone Encounter (Signed)
Letter is done.

## 2014-04-01 NOTE — Telephone Encounter (Signed)
Pt came by the office and said she had something on her camera to show Neil Crouch, PA. Magda Paganini was in with patient so I told Marzetta Board to tell pt to stay seated and I would let Magda Paganini know when she was available.  Shortly after that, Marzetta Board brought me a form and said that the pt left it for [place] to complete for her, and that she would return tomorrow to show her the picture and to pick up the completed form.   The form had written on it :  Need clear diagnosis  Need medical documentation to justify requested accommodations.   1. Ear or drink snack or water in class 2.Take a break as needed 3. Extra time on tests 4. Use a recorder in class  Per Magda Paganini, pt was told to e-mail the picture, so she could look at it at her convenience, she gave the patient her e-mail address.

## 2014-04-01 NOTE — Telephone Encounter (Signed)
Yesterday I gave patient my email address to send the picture to me. I'm not sure if I will be available when she comes by tomorrow given it is a hospital day etc.

## 2014-04-02 MED ORDER — LIDOCAINE-HYDROCORTISONE ACE 3-2.5 % RE KIT: PACK | RECTAL | Status: DC

## 2014-04-02 MED ORDER — LINACLOTIDE 290 MCG PO CAPS: 290.0000 ug | ORAL_CAPSULE | Freq: Every day | ORAL | Status: DC

## 2014-04-02 NOTE — Telephone Encounter (Signed)
Looked at picture. Difficult to see due to darkness of picture but appeared to be external hemorrhoid skin tag.  Increase Linzess to 247mcg daily. She can double the ones she has or new RX.

## 2014-04-02 NOTE — Addendum Note (Signed)
Addended by: Danie Binder on: 04/02/2014 08:33 AM   Modules accepted: Orders

## 2014-04-02 NOTE — Addendum Note (Signed)
Addended by: Mahala Menghini on: 04/02/2014 03:24 PM   Modules accepted: Orders

## 2014-04-02 NOTE — Telephone Encounter (Addendum)
PLEASE CALL PT. SHE DOES NOT NEED ANYMORE HYDROCODONE. THE PROCEDURE I DID HELPED THE INTERNAL HEMORRHOIDS AND NOT THE EXTERNAL HEMORRHOIDS.SHE CAN PICK UP HEMORRHOID CREAM FROM [facility] AND IT WILL HELP HER EXTERNAL HEMORRHOID PAIN. THE RX HAS BEEN SENT. SHE NEEDS TO SEE [facility] IN [place] TO CONSIDER A EXTERNAL HEMORRHOIDECTOMY IF SHE CONTINUES TO HAVE RECTAL PAIN.   SHE MAY HAVE A NOTE STATING SHE HAS HEMORRHOIDS. IT WILL BE GOOD FOR UNTIL MAR 4. THE NOTE SHOULD SAY SHE CAN:  1. EaT or drink snack or water in class 2.Take a break as needed 3. Extra time on tests 4. Use a recorder in class.  PLEASE COMPLETE FORM SO SHE CAN PICK IT UP.

## 2014-04-02 NOTE — Telephone Encounter (Signed)
RX done. 

## 2014-04-02 NOTE — Telephone Encounter (Signed)
LMOM for pt to call.  Before I got message from Neil Crouch, PA, pt called and asked if she could take a bath now ( tub bath) and if she should use Epsom salt and could she use her lavender bubble bath.  I told her it should be fine to take a tub bath and ok to use her bubble bath but not to use any epsom salt at this time.

## 2014-04-02 NOTE — Telephone Encounter (Signed)
PT came by the office and was given the letter/note. She signed the release for [place] to be able to give the school info. She will pick up the cream at [facility].   She emailed the picture to Neil Crouch, [place]. Said she is taking the Linzess but still continues to have small pieces of stool and she feels there is something interfering with the stool getting out since she eats quite abit.  She said she still has big clots of blood with the tiny pieces of stool.   She is aware that if her pain continues she can be referral to a surgeon.

## 2014-04-02 NOTE — Telephone Encounter (Signed)
Pt is aware to increase the Linzess 145 to 290 mcg daily. Ok to send prescription to pharmacy and she will double up on her samples until she picks up the prescription.

## 2014-04-02 NOTE — Telephone Encounter (Signed)
I called pt to give her to inform and LMOM. Ginger cancelled the order for the hemorrhoid cream at [facility] and called it to [facility].  I have written the letter/note and left at from for the pt to pick up.

## 2014-04-09 NOTE — Telephone Encounter (Signed)
REVIEWED. AGREE. NO ADDITIONAL RECOMMENDATIONS. 

## 2014-04-20 ENCOUNTER — Ambulatory Visit (INDEPENDENT_AMBULATORY_CARE_PROVIDER_SITE_OTHER): Payer: Self-pay | Admitting: Obstetrics and Gynecology

## 2014-04-20 ENCOUNTER — Encounter: Payer: Self-pay | Admitting: Obstetrics and Gynecology

## 2014-04-20 ENCOUNTER — Ambulatory Visit: Payer: Self-pay | Admitting: Obstetrics and Gynecology

## 2014-04-20 VITALS — BP 100/60 | Ht 63.0 in | Wt 208.0 lb

## 2014-04-20 DIAGNOSIS — N920 Excessive and frequent menstruation with regular cycle: Secondary | ICD-10-CM

## 2014-04-20 NOTE — Progress Notes (Addendum)
Patient ID: Meghan Carter, female   DOB: 1980/02/26, 34 y.o.   MRN: [id number] Pt here today for follow up from last visit. Pt states that she is also here today set a date for surgery, which i believe is the sonohysterogram  Several concerns:    Meghan Carter is a 34 y.o. female presenting today for  1. Pain in hip on rt and rt flank that debilitates pt and is worsened by sex and afterward. Pt insecure and concerned that a spot on rt hip "popped up and dont look right and makes pt insecure." patient's understanding of her anatomy seems confused. 2. Bleeding which she thinks is stillfrom rectum, thick clots, noted with BM;s and pt is somewhat uncertain of her anatomy. 3. anorgasmic  4. Pt had u/s identification of a lower uterine segment ?polyp which might explain her alleged bleeding. ROS:  Insecure in partnership  Pertinent History Reviewed:   Reviewed: Significant for  Medical         Past Medical History  Diagnosis Date  . Hydrosalpinx   . HSV (herpes simplex virus) infection   . Vaginal Pap smear, abnormal   . Pelvic pain in female 01/27/2014  . Menorrhagia with regular cycle 01/27/2014  . Rectal bleeding 01/27/2014  . Fecal occult blood test positive 01/27/2014  . Endometrial polyp 02/11/2014  . History of abnormal cervical Pap smear 02/11/2014                              Surgical Hx:    Past Surgical History  Procedure Laterality Date  . Ectopic pregnancy surgery    . Colonoscopy N/A 03/23/2014    Procedure: COLONOSCOPY;  Surgeon: Danie Binder, MD;  [facility];  Service: Endoscopy;  Laterality: N/A;  215pm - moved to 10:45 - cancellation  . Hemorrhoid banding N/A 03/23/2014    Procedure: HEMORRHOID BANDING;  Surgeon: Danie Binder, MD;  Location: [facility];  Service: Endoscopy;  Laterality: N/A;   Medications: Reviewed & Updated - see associated section                       Current outpatient prescriptions:  .  HYDROcodone-acetaminophen  (NORCO/VICODIN) 5-325 MG per tablet, 1/2 to 1 EVERY 4-6 H PRN FOR PAIN, Disp: 30 tablet, Rfl: 0 .  ibuprofen (ADVIL,MOTRIN) 800 MG tablet, Take 1 tablet (800 mg total) by mouth every 8 (eight) hours as needed., Disp: 60 tablet, Rfl: 1 .  Lidocaine-Hydrocortisone Ace 3-2.5 % KIT, APPLY TO RECTUM QID FOR 2 WEEKS, Disp: 1 each, Rfl: 0 .  Linaclotide (LINZESS) 290 MCG CAPS capsule, Take 1 capsule (290 mcg total) by mouth daily., Disp: 30 capsule, Rfl: 11 .  pramoxine-hydrocortisone (PROCTOCREAM-HC) 1-1 % rectal cream, USE PR 4 TIMES A DAY FOR 10 DAYS., Disp: 30 g, Rfl: 0   Social History: Reviewed -  reports that she has been smoking Cigarettes.  She has a 5.5 pack-year smoking history. She has never used smokeless tobacco.  Objective Findings:  Vitals: Blood pressure 100/60, height 5' 3"  (1.6 m), weight 208 lb (94.348 kg), last menstrual period 04/06/2014.  Physical Examination: talk only x 20 min   Assessment & Plan:   A: 1. Recurrent bleeding from rectum or vagina, with negative GI workup. 2. U/s evidence of endometrial polyp vs clot    P:  1. sonohysterogram  On April 7 Will need pre procedure HCG

## 2014-04-21 ENCOUNTER — Other Ambulatory Visit: Payer: Self-pay | Admitting: Obstetrics and Gynecology

## 2014-04-21 DIAGNOSIS — N920 Excessive and frequent menstruation with regular cycle: Secondary | ICD-10-CM

## 2014-04-22 ENCOUNTER — Telehealth: Payer: Self-pay | Admitting: Obstetrics and Gynecology

## 2014-04-22 ENCOUNTER — Ambulatory Visit: Payer: Self-pay | Admitting: Obstetrics and Gynecology

## 2014-04-22 NOTE — Telephone Encounter (Signed)
Pt has been scheduled for hsg to rule out polyp

## 2014-04-23 ENCOUNTER — Encounter: Payer: Self-pay | Admitting: Obstetrics and Gynecology

## 2014-05-14 ENCOUNTER — Ambulatory Visit (HOSPITAL_COMMUNITY)
Admission: RE | Admit: 2014-05-14 | Discharge: 2014-05-14 | Disposition: A | Discharge status: Home / Self Care | Payer: MEDICAID | Source: Ambulatory Visit | Attending: Obstetrics and Gynecology | Admitting: Obstetrics and Gynecology

## 2014-05-14 DIAGNOSIS — N920 Excessive and frequent menstruation with regular cycle: Secondary | ICD-10-CM

## 2014-05-14 MED ORDER — POVIDONE-IODINE 10 % EX SOLN
CUTANEOUS | Status: AC
  Filled 2014-05-14: qty 30

## 2014-05-14 NOTE — Telephone Encounter (Signed)
Date: 05/14/14 Pt presents for sonohysterogram for suspected endometrial polyp. Pt LMP was 3 wk ago. Pt and husband were sexually active recently, since LMP, without protection, so SHG will need to be rescheduled, ideally immediately after her next menses.  Pt understands clearly that she must NOT  Be sexually active from the time of menses til her procedure. Procedure scheduled for NOON , 18 April.

## 2014-05-20 ENCOUNTER — Ambulatory Visit: Payer: Self-pay | Admitting: Gastroenterology

## 2014-05-25 ENCOUNTER — Other Ambulatory Visit: Payer: Self-pay | Admitting: Obstetrics and Gynecology

## 2014-05-25 ENCOUNTER — Ambulatory Visit (HOSPITAL_COMMUNITY)
Admission: RE | Admit: 2014-05-25 | Discharge: 2014-05-25 | Disposition: A | Discharge status: Home / Self Care | Payer: Self-pay | Source: Ambulatory Visit | Attending: Obstetrics and Gynecology | Admitting: Obstetrics and Gynecology

## 2014-05-25 DIAGNOSIS — N920 Excessive and frequent menstruation with regular cycle: Secondary | ICD-10-CM

## 2014-05-25 DIAGNOSIS — N921 Excessive and frequent menstruation with irregular cycle: Secondary | ICD-10-CM | POA: Insufficient documentation

## 2014-05-25 IMAGING — US US PELVIS LIMITED
1 series · 14 of 14 positions shown · non-contrast
Comparison: [DATE].

CLINICAL DATA: Excessive and frequent menstruation.

EXAM:
US SONOHYSTEROGRAM
TECHNIQUE: Following cleansing of the cervix and vagina with Betadine, a
hysterosalpingogram catheter was placed within the endocervical
canal. Sonohysterogram was then performed with transvaginal
sonography during infusion of sterile saline solution into the
endometrial cavity.

[Series 1: us pelvis limited · 0.10mm/px · 14 of 14 slices shown]
[im 1/14]
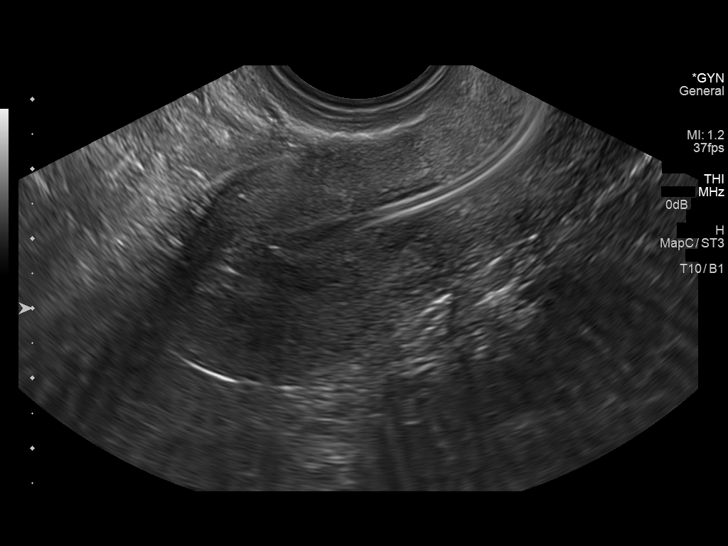
[im 2/14]
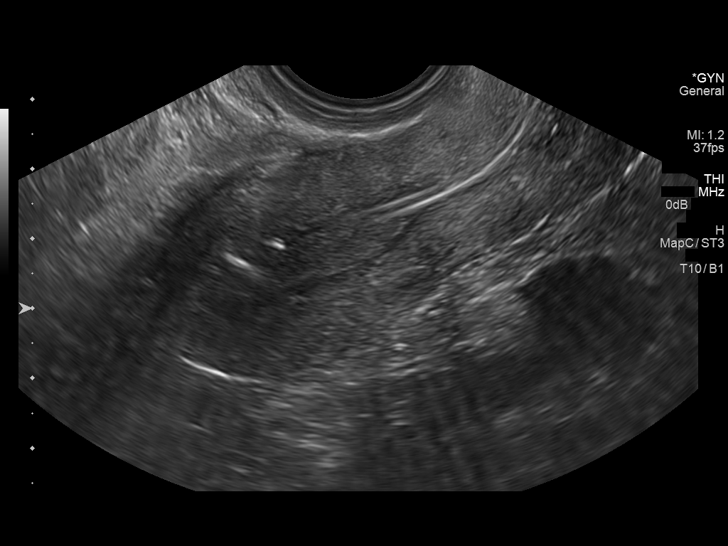
[im 3/14]
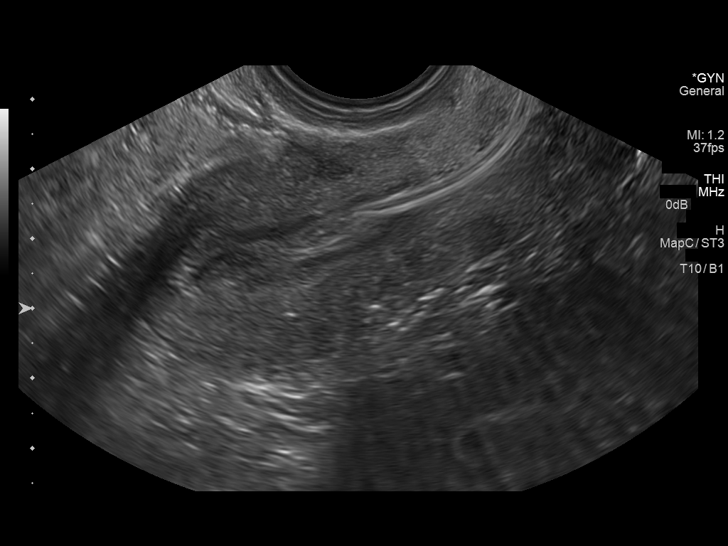
[im 4/14]
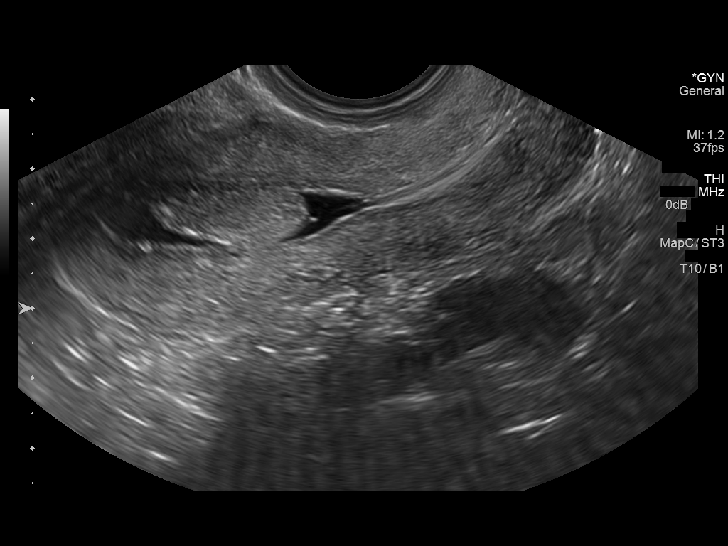
[im 5/14]
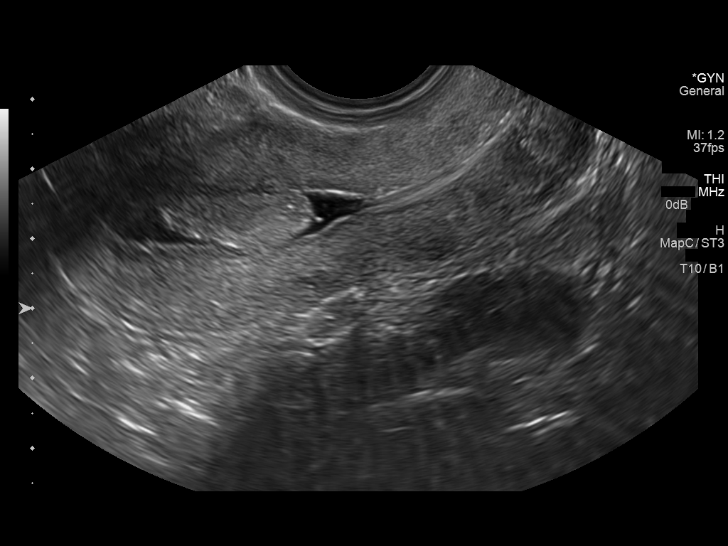
[im 6/14]
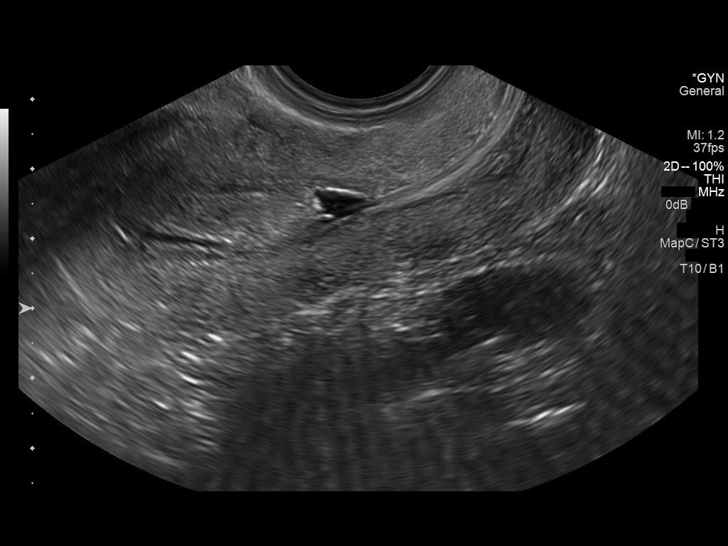
[im 7/14]
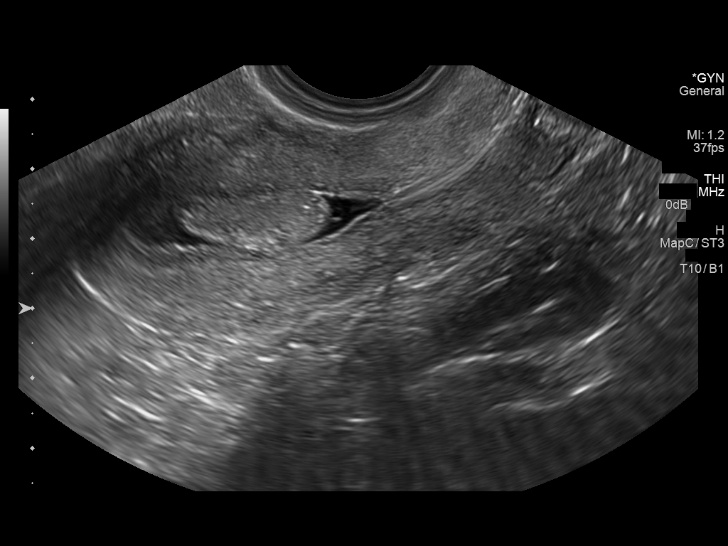
[im 8/14]
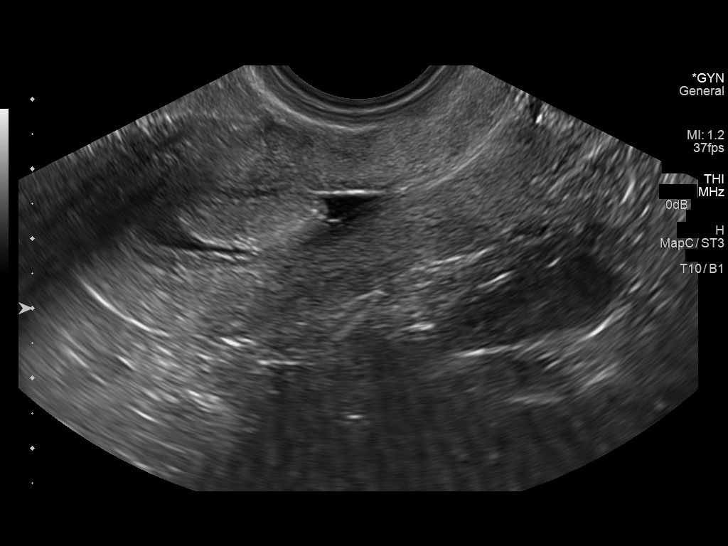
[im 9/14]
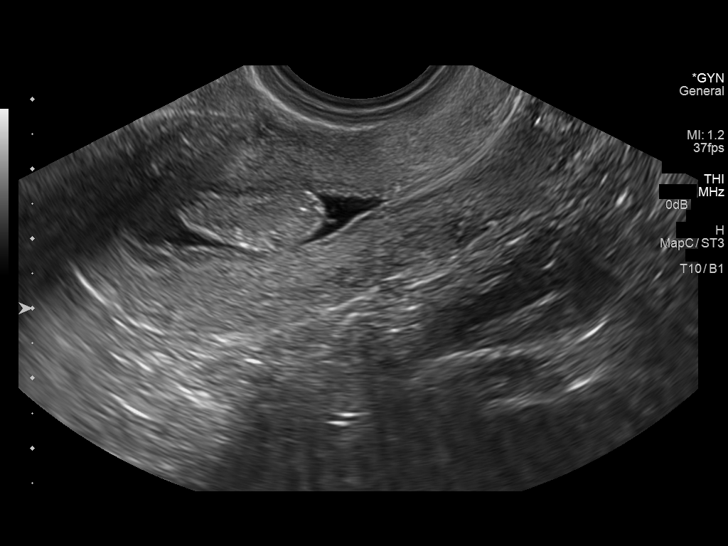
[im 10/14]
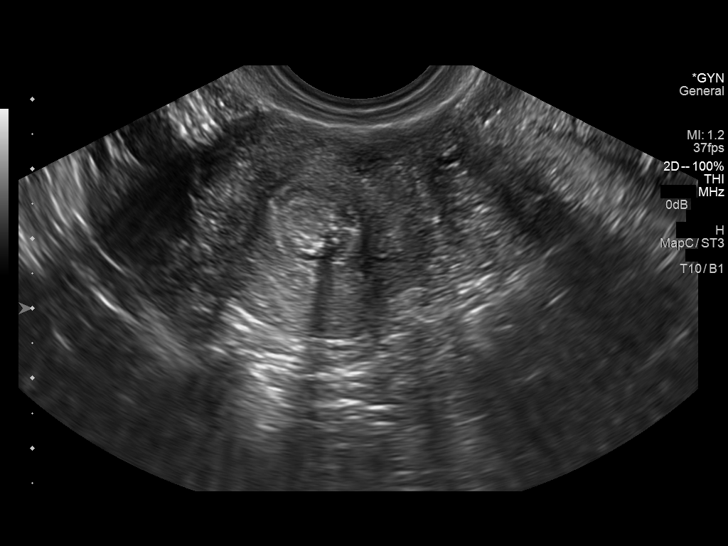
[im 11/14]
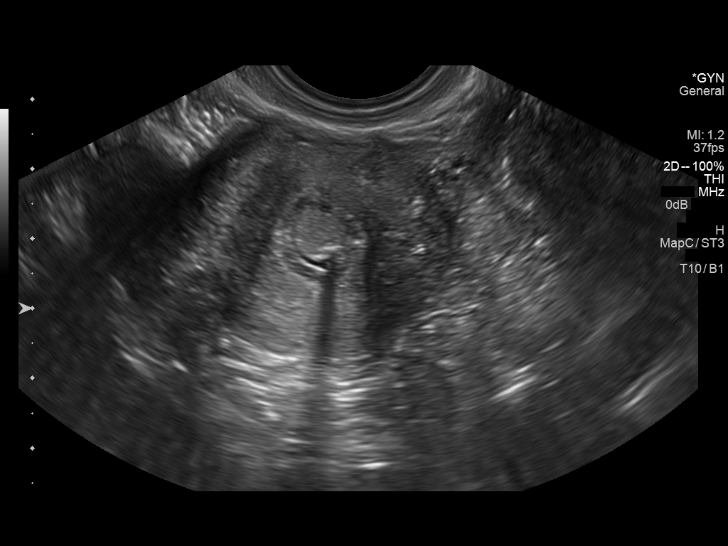
[im 12/14]
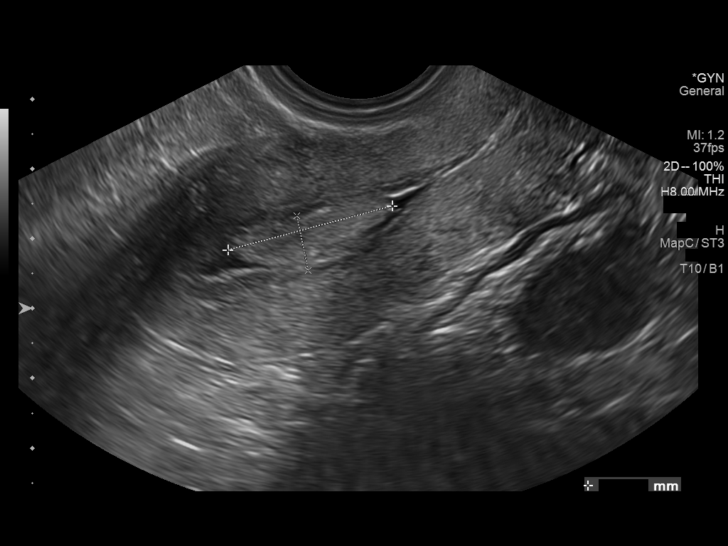
[im 13/14]
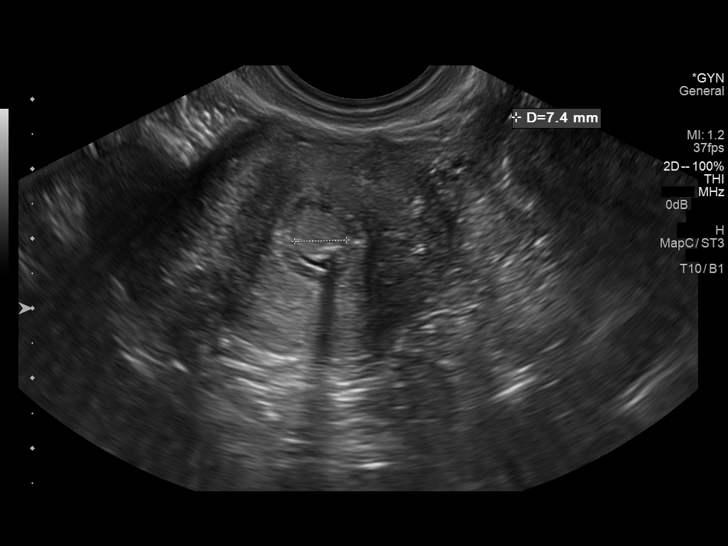
[im 14/14]
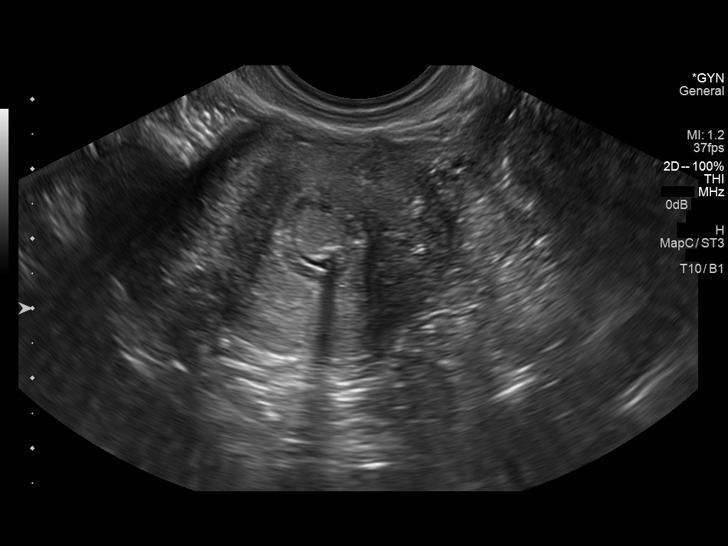

[14 of 14 positions shown; findings below may reference images not displayed]

FINDINGS: A 2.5 x 0.8 x 0.7 cm endometrial mass is noted. This is most likely
an endometrial polyp. Endometrial malignancy cannot be excluded.
IMPRESSION: 2.5 x 0.8 x 0.7 cm endometrial mass.

## 2014-05-25 MED ORDER — POVIDONE-IODINE 10 % EX SOLN
CUTANEOUS | Status: AC
  Filled 2014-05-25: qty 30

## 2014-05-25 MED ORDER — SODIUM CHLORIDE 0.9 % IJ SOLN
INTRAMUSCULAR | Status: AC
  Filled 2014-05-25: qty 20

## 2014-05-26 ENCOUNTER — Telehealth: Payer: Self-pay | Admitting: *Deleted

## 2014-05-26 LAB — POCT PREGNANCY, URINE: Preg Test, Ur: NEGATIVE

## 2014-05-26 MED ORDER — DOXYCYCLINE HYCLATE 100 MG PO CAPS: 100.0000 mg | ORAL_CAPSULE | Freq: Two times a day (BID) | ORAL | Status: DC

## 2014-05-26 MED ORDER — HYDROCODONE-ACETAMINOPHEN 5-325 MG PO TABS: ORAL_TABLET | ORAL | Status: DC

## 2014-05-26 NOTE — Telephone Encounter (Signed)
Pt to come in to pick up rx for vicodin x 15 tabs.

## 2014-05-27 ENCOUNTER — Encounter: Payer: Self-pay | Admitting: Obstetrics and Gynecology

## 2014-05-27 ENCOUNTER — Telehealth: Payer: Self-pay | Admitting: Obstetrics and Gynecology

## 2014-05-28 NOTE — Telephone Encounter (Signed)
Needs hysteroscopy, removal of endometrial polyp

## 2014-06-01 ENCOUNTER — Ambulatory Visit (INDEPENDENT_AMBULATORY_CARE_PROVIDER_SITE_OTHER): Payer: Self-pay | Admitting: Obstetrics and Gynecology

## 2014-06-01 ENCOUNTER — Encounter: Payer: Self-pay | Admitting: Obstetrics and Gynecology

## 2014-06-01 VITALS — BP 130/60 | Ht 63.0 in | Wt 210.0 lb

## 2014-06-01 DIAGNOSIS — N840 Polyp of corpus uteri: Secondary | ICD-10-CM

## 2014-06-01 NOTE — Progress Notes (Signed)
Patient ID: Meghan Carter, female   DOB: November 03, 1980, 34 y.o.   MRN: [id number] Pt here today for pre op exam. Pt states that she took her last hydrocodone today. Pt states that she is still in pain from her HSG, and she is still having some dark spotting.   Preoperative History and Physical  Meghan Carter is a 34 y.o. G1P0010 here for surgical management of Removal of endometrial polyp.   No significant preoperative concerns. The patient has a low pain tolerance, as evidenced at time of sonohysterogram, and the requests for far more pain meds post SHG than usual. In conversations with the patient, she was apparently of the impression that we had left the catheter in the uterus since the [facility], so that issue is hopefully resolved in her mind.   Proposed surgery: Hysteroscopy, Dilation and Curettage, with removal of endometrial polyp  Past Medical History  Diagnosis Date  . Hydrosalpinx   . HSV (herpes simplex virus) infection   . Vaginal Pap smear, abnormal   . Pelvic pain in female 01/27/2014  . Menorrhagia with regular cycle 01/27/2014  . Rectal bleeding 01/27/2014  . Fecal occult blood test positive 01/27/2014  . Endometrial polyp 02/11/2014  . History of abnormal cervical Pap smear 02/11/2014   Past Surgical History  Procedure Laterality Date  . Ectopic pregnancy surgery    . Colonoscopy N/A 03/23/2014    Procedure: COLONOSCOPY;  Surgeon: Danie Binder, MD;  [facility];  Service: Endoscopy;  Laterality: N/A;  215pm - moved to 10:45 - cancellation  . Hemorrhoid banding N/A 03/23/2014    Procedure: HEMORRHOID BANDING;  Surgeon: Danie Binder, MD;  Location[facility];  Service: Endoscopy;  Laterality: N/A;   OB History  Gravida Para Term Preterm AB SAB TAB Ectopic Multiple Living  1    1   1       # Outcome Date GA Lbr Len/2nd Weight Sex Delivery Anes PTL Lv  1 Ectopic             Patient denies any other pertinent gynecologic issues.   Current Outpatient  Prescriptions on File Prior to Visit  Medication Sig Dispense Refill  . doxycycline (VIBRAMYCIN) 100 MG capsule Take 1 capsule (100 mg total) by mouth 2 (two) times daily. 6 capsule 0  . HYDROcodone-acetaminophen (NORCO/VICODIN) 5-325 MG per tablet 1/2 to 1 EVERY 4-6 H PRN FOR PAIN 15 tablet 0  . ibuprofen (ADVIL,MOTRIN) 800 MG tablet Take 1 tablet (800 mg total) by mouth every 8 (eight) hours as needed. 60 tablet 1  . Lidocaine-Hydrocortisone Ace 3-2.5 % KIT APPLY TO RECTUM QID FOR 2 WEEKS 1 each 0  . Linaclotide (LINZESS) 290 MCG CAPS capsule Take 1 capsule (290 mcg total) by mouth daily. 30 capsule 11  . pramoxine-hydrocortisone (PROCTOCREAM-HC) 1-1 % rectal cream USE PR 4 TIMES A DAY FOR 10 DAYS. 30 g 0   No current facility-administered medications on file prior to visit.   No Known Allergies  Social History:   reports that she has been smoking Cigarettes.  She has a 5.5 pack-year smoking history. She has never used smokeless tobacco. She reports that she drinks alcohol. She reports that she does not use illicit drugs.  Family History  Problem Relation Age of Onset  . Diabetes Father   . Diabetes Paternal Grandmother   . Diabetes Paternal Grandfather   . Colon cancer Neg Hx   . Inflammatory bowel disease Neg Hx     Review  of Systems: Noncontributory  PHYSICAL EXAM: Blood pressure 130/60, height 5' 3"  (1.6 m), weight 210 lb (95.255 kg), last menstrual period 05/18/2014. General appearance - alert, well appearing, and in no distress Chest - clear to auscultation, no wheezes, rales or rhonchi, symmetric air entry Heart - normal rate and regular rhythm Abdomen - soft, nontender, nondistended, no masses or organomegaly Pelvic - Physical Examination: Pelvic - VULVA: normal appearing vulva with no masses, tenderness or lesions, VAGINA: normal appearing vagina with normal color and discharge, no lesions, CERVIX: normal appearing cervix without discharge or lesions, UTERUS: anteverted,  mobile, ADNEXA: normal adnexa in size, nontender and no masses, exam chaperoned by AMT  Extremities - peripheral pulses normal, no pedal edema, no clubbing or cyanosis  Labs: Results for orders placed or performed during the hospital encounter of 05/25/14 (from the past 336 hour(s))  Pregnancy, urine POC   Collection Time: 05/25/14 12:14 PM  Result Value Ref Range   Preg Test, Ur NEGATIVE NEGATIVE    Imaging Studies: US Pelvis Limited  05/25/2014   CLINICAL DATA:  Excessive and frequent menstruation.  EXAM: [place] SONOHYSTEROGRAM  TECHNIQUE: Following cleansing of the cervix and vagina with Betadine, a hysterosalpingogram catheter was placed within the endocervical canal. Sonohysterogram was then performed with transvaginal sonography during infusion of sterile saline solution into the endometrial cavity.  COMPARISON:  01/28/2014.  FINDINGS: A 2.5 x 0.8 x 0.7 cm endometrial mass is noted. This is most likely an endometrial polyp. Endometrial malignancy cannot be excluded.  IMPRESSION: 2.5 x 0.8 x 0.7 cm endometrial mass.   Electronically Signed   By: Marcello Moores  Register   On: 05/25/2014 14:39   [place] Sonohysterogram  05/25/2014   CLINICAL DATA:  Menometrorrhagia  EXAM: [place] SONOHYSTEROGRAM  TECHNIQUE: Sonohysterogram was performed by the ordering physician. Ultrasound images were submitted for interpretation following the procedure.  COMPARISON:  Transvaginal pelvic ultrasound same date, pelvic ultrasound 01/28/2014  FINDINGS: Images obtained without the radiologist present demonstrate a 2.5 x 0.8 x 0.7 cm oval filling defect within the endometrial canal.  IMPRESSION: 2.5 cm mass within the endometrial canal, most likely polyp although submucosal fibroid or neoplasia could appear similar. Sampling is recommended.   Electronically Signed   By: Conchita Paris M.D.   On: 05/25/2014 13:17    Assessment: Patient Active Problem List   Diagnosis Date Noted  . Rectal pain 03/19/2014  . Hydrosalpinx 02/11/2014   . Endometrial polyp 02/11/2014  . History of abnormal cervical Pap smear 02/11/2014  . Pelvic pain in female 01/27/2014  . Menorrhagia with regular cycle 01/27/2014  . Rectal bleeding 01/27/2014  . Fecal occult blood test positive 01/27/2014    Plan: Patient will undergo surgical management with Hysteroscopy ,Dilation and Curettage, Removal of endometrial polyp.  A careful attempt to explain the procedure to the patient is performed, as she is prone to misperceive instructions. We specifically indicated that this will ONLY address vaginal bleeding from the endometrial polyp, and will not address the rectal bleeding that she thinks is occurring, which has had a thorough w/u by Dr Oneida Alar and is attributed to internal hemorrhoids..    .mec 06/05/2014 1:00 PM

## 2014-06-03 ENCOUNTER — Telehealth: Payer: Self-pay | Admitting: Obstetrics and Gynecology

## 2014-06-03 NOTE — Telephone Encounter (Signed)
I have completed another challenging conversation with [employer], who alleges she is out of work today and wants a note, but is unable to say if she will be returning to work this week. Additionally  Pt is reapplying for [facility] discount. Pt is confused with some of the conversation from our last visit; she misinterpreted my exam where i checked for the possibility of prolapse of the endometrial polyp; she thought I was checking for some device she thought I had left in side at the time of her HSG. Pt wanted to know if the alleged device might be causing her pain. Efforts again made to clarify pt's misperceptions. Pt is somewhat argumentative, and is frustrated that I have not come up with a single trip to the OR that will fix all her problems. I have again INSISTED that pt write down a specific point by point list of her concerns, and bring it to her next appointment, as too many conversations seem to end with her dissatisfied or confused or just argumentative. Pt is to expect a call from [place] tomorrow re: hysteroscopy and polypectomy, and she will need another preop , given her level of misperceptions. Pt again reiterates that she does NOT want a hysterectomy. Pt does mention pain on her side(which?), so that issue will need addressing in conversation prior to any surgery.

## 2014-06-03 NOTE — Telephone Encounter (Signed)
Pt states that she has been taking the 800mg  Ibuprofen and it is not helping. Pt states that the pain is terrible and she needs something stronger than that for the pain. Pt states that she was not able to work today and that she thinks she should be written out of work for the rest of the week. Pt has talked to [facility] at [facility].   Pt aware that her first message from this morning was sent to Dr. Glo Herring and I am just waiting on an answer from him. I advised the pt that Dr. Glo Herring or myself would give her a call back this afternoon and let her know what he has decided. Pt verbalized understanding.

## 2014-06-04 NOTE — Telephone Encounter (Signed)
Pt stated that Dr. Glo Herring did call her last night and is going to have Amy call her and give her an Rx for pain medication.

## 2014-06-08 ENCOUNTER — Telehealth: Payer: Self-pay | Admitting: Obstetrics and Gynecology

## 2014-06-08 NOTE — Telephone Encounter (Signed)
Pt states that someone from the hospital called her and asked her what medications she was taking. Pt states that she asked her if she was taking doxycyline. Pt states that she has never taken the doxycycline. Pt states she is going to go ahead and get the medication and take it.   Pt states that she has thought about her surgery and she thinks that she wants to go ahead and have a hysterectomy. Pt states that she found out today that her husband has been cheating on her due to the problems she has been having healthth wise. Pt states that she is ready to move on and live her life. Pt states that she just wants to feel better.   I advised the pt that I would send this message to Dr. Glo Herring and have him give her a call so they could discuss in detail what her surgery needs to be.

## 2014-06-09 ENCOUNTER — Telehealth: Payer: Self-pay | Admitting: Obstetrics and Gynecology

## 2014-06-09 NOTE — Patient Instructions (Signed)
Twisha Aetna  06/09/2014   Your procedure is scheduled on:  06/16/2014  Report to [facility] at 6:15 AM.  Call this number if you have problems the morning of surgery: [phone]   Remember:   Do not eat food or drink liquids after midnight.   Take these medicines the morning of surgery with A SIP OF WATER: Linzess, Hydrocodone   Do not wear jewelry, make-up or nail polish.  Do not wear lotions, powders, or perfumes. You may wear deodorant.  Do not shave 48 hours prior to surgery. Men may shave face and neck.  Do not bring valuables to the hospital.  [facility] is not responsible for any belongings or valuables.               Contacts, dentures or bridgework may not be worn into surgery.  Leave suitcase in the car. After surgery it may be brought to your room.  For patients admitted to the hospital, discharge time is determined by your treatment team.               Patients discharged the day of surgery will not be allowed to drive home.  Name and phone number of your driver:   Special Instructions: Shower using CHG 2 nights before surgery and the night before surgery.  If you shower the day of surgery use CHG.  Use special wash - you have one bottle of CHG for all showers.  You should use approximately 1/3 of the bottle for each shower.   Please read over the following fact sheets that you were given: Surgical Site Infection Prevention and Anesthesia Post-op Instructions   PATIENT INSTRUCTIONS POST-ANESTHESIA  IMMEDIATELY FOLLOWING SURGERY:  Do not drive or operate machinery for the first twenty four hours after surgery.  Do not make any important decisions for twenty four hours after surgery or while taking narcotic pain medications or sedatives.  If you develop intractable nausea and vomiting or a severe headache please notify your doctor immediately.  FOLLOW-UP:  Please make an appointment with your surgeon as instructed. You do not need to follow up with anesthesia unless  specifically instructed to do so.  WOUND CARE INSTRUCTIONS (if applicable):  Keep a dry clean dressing on the anesthesia/puncture wound site if there is drainage.  Once the wound has quit draining you may leave it open to air.  Generally you should leave the bandage intact for twenty four hours unless there is drainage.  If the epidural site drains for more than 36-48 hours please call the anesthesia department.  QUESTIONS?:  Please feel free to call your physician or the hospital operator if you have any questions, and they will be happy to assist you.      Dilation and Curettage or Vacuum Curettage Dilation and curettage (D&C) and vacuum curettage are minor procedures. A D&C involves stretching (dilation) the cervix and scraping (curettage) the inside lining of the womb (uterus). During a D&C, tissue is gently scraped from the inside lining of the uterus. During a vacuum curettage, the lining and tissue in the uterus are removed with the use of gentle suction.  Curettage may be performed to either diagnose or treat a problem. As a diagnostic procedure, curettage is performed to examine tissues from the uterus. A diagnostic curettage may be performed for the following symptoms:   Irregular bleeding in the uterus.   Bleeding with the development of clots.   Spotting between menstrual periods.   Prolonged menstrual periods.  Bleeding after menopause.   No menstrual period (amenorrhea).   A change in size and shape of the uterus.  As a treatment procedure, curettage may be performed for the following reasons:   Removal of an IUD (intrauterine device).   Removal of retained placenta after giving birth. Retained placenta can cause an infection or bleeding severe enough to require transfusions.   Abortion.   Miscarriage.   Removal of polyps inside the uterus.   Removal of uncommon types of noncancerous lumps (fibroids).  LET [facility] CARE PROVIDER KNOW ABOUT:   Any  allergies you have.   All medicines you are taking, including vitamins, herbs, eye drops, creams, and over-the-counter medicines.   Previous problems you or members of your family have had with the use of anesthetics.   Any blood disorders you have.   Previous surgeries you have had.   Medical conditions you have. RISKS AND COMPLICATIONS  Generally, this is a safe procedure. However, as with any procedure, complications can occur. Possible complications include:  Excessive bleeding.   Infection of the uterus.   Damage to the cervix.   Development of scar tissue (adhesions) inside the uterus, later causing abnormal amounts of menstrual bleeding.   Complications from the general anesthetic, if a general anesthetic is used.   Putting a hole (perforation) in the uterus. This is rare.  BEFORE THE PROCEDURE   Eat and drink before the procedure only as directed by your health care provider.   Arrange for someone to take you home.  PROCEDURE  This procedure usually takes about 15-30 minutes.  You will be given one of the following:  A medicine that numbs the area in and around the cervix (local anesthetic).   A medicine to make you sleep through the procedure (general anesthetic).  You will lie on your back with your legs in stirrups.   A warm metal or plastic instrument (speculum) will be placed in your vagina to keep it open and to allow the health care provider to see the cervix.  There are two ways in which your cervix can be softened and dilated. These include:   Taking a medicine.   Having thin rods (laminaria) inserted into your cervix.   A curved tool (curette) will be used to scrape cells from the inside lining of the uterus. In some cases, gentle suction is applied with the curette. The curette will then be removed.  AFTER THE PROCEDURE   You will rest in the recovery area until you are stable and are ready to go home.   You may feel sick to  your stomach (nauseous) or throw up (vomit) if you were given a general anesthetic.   You may have a sore throat if a tube was placed in your throat during general anesthesia.   You may have light cramping and bleeding. This may last for 2 days to 2 weeks after the procedure.   Your uterus needs to make a new lining after the procedure. This may make your next period late. Document Released: 01/23/2005 Document Revised: 09/25/2012 Document Reviewed: 08/22/2012 [facility] Patient Information 2015 [place], [place]. This information is not intended to replace advice given to you by your health care provider. Make sure you discuss any questions you have with your health care provider.

## 2014-06-10 ENCOUNTER — Other Ambulatory Visit: Payer: Self-pay | Admitting: Obstetrics and Gynecology

## 2014-06-10 ENCOUNTER — Encounter: Payer: Self-pay | Admitting: Obstetrics and Gynecology

## 2014-06-10 ENCOUNTER — Other Ambulatory Visit: Payer: Self-pay

## 2014-06-10 ENCOUNTER — Encounter (HOSPITAL_COMMUNITY): Payer: Self-pay

## 2014-06-10 ENCOUNTER — Ambulatory Visit (INDEPENDENT_AMBULATORY_CARE_PROVIDER_SITE_OTHER): Payer: Self-pay | Admitting: Obstetrics and Gynecology

## 2014-06-10 ENCOUNTER — Encounter (HOSPITAL_COMMUNITY)
Admission: RE | Admit: 2014-06-10 | Discharge: 2014-06-10 | Disposition: A | Discharge status: Home / Self Care | Payer: Self-pay | Source: Ambulatory Visit | Attending: Obstetrics and Gynecology | Admitting: Obstetrics and Gynecology

## 2014-06-10 VITALS — BP 110/60 | Ht 63.0 in | Wt 206.0 lb

## 2014-06-10 DIAGNOSIS — N7011 Chronic salpingitis: Secondary | ICD-10-CM | POA: Insufficient documentation

## 2014-06-10 DIAGNOSIS — N840 Polyp of corpus uteri: Secondary | ICD-10-CM

## 2014-06-10 DIAGNOSIS — Z01818 Encounter for other preprocedural examination: Secondary | ICD-10-CM | POA: Insufficient documentation

## 2014-06-10 LAB — URINALYSIS, ROUTINE W REFLEX MICROSCOPIC
GLUCOSE, UA: NEGATIVE mg/dL
KETONES UR: NEGATIVE mg/dL
Leukocytes, UA: NEGATIVE
Nitrite: NEGATIVE
Protein, ur: 30 mg/dL — AB
Specific Gravity, Urine: 1.025 (ref 1.005–1.030)
Urobilinogen, UA: 0.2 mg/dL (ref 0.0–1.0)
pH: 7.5 (ref 5.0–8.0)

## 2014-06-10 LAB — CBC
HCT: 32.2 % — ABNORMAL LOW (ref 36.0–46.0)
Hemoglobin: 10 g/dL — ABNORMAL LOW (ref 12.0–15.0)
MCH: 23.5 pg — AB (ref 26.0–34.0)
MCHC: 31.1 g/dL (ref 30.0–36.0)
MCV: 75.6 fL — AB (ref 78.0–100.0)
Platelets: 406 10*3/uL — ABNORMAL HIGH (ref 150–400)
RBC: 4.26 MIL/uL (ref 3.87–5.11)
RDW: 15.5 % (ref 11.5–15.5)
WBC: 5.7 10*3/uL (ref 4.0–10.5)

## 2014-06-10 LAB — BASIC METABOLIC PANEL
Anion gap: 8 (ref 5–15)
BUN: 10 mg/dL (ref 6–20)
CALCIUM: 9.4 mg/dL (ref 8.9–10.3)
CO2: 26 mmol/L (ref 22–32)
CREATININE: 0.77 mg/dL (ref 0.44–1.00)
Chloride: 103 mmol/L (ref 101–111)
GFR calc Af Amer: >60 mL/min (ref >60)
GFR calc non Af Amer: >60 mL/min (ref >60)
GLUCOSE: 98 mg/dL (ref 70–99)
Potassium: 4.5 mmol/L (ref 3.5–5.1)
SODIUM: 137 mmol/L (ref 135–145)

## 2014-06-10 LAB — URINE MICROSCOPIC-ADD ON

## 2014-06-10 LAB — HCG, SERUM, QUALITATIVE: Preg, Serum: NEGATIVE
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
## 2014-06-10 NOTE — Progress Notes (Signed)
Patient ID: Meghan Carter, female   DOB: 03/05/1980, 34 y.o.   MRN: [id number]   [facility] Visit  Patient name: Meghan Carter MRN [id number]  Date of birth: [birth year]  CC & HPI:  Meghan Carter is a 34 y.o. female presenting today for discussion of surgery for management of endometrial polyps. Patient has a history of left salpingectomy. Patient has not had sexual activity with her partner since I last told her not to, but she suspects her partner has had sexual activity with other women. She is unable to remember her last STD test, and that information is not available in her medical records.the patient has a known right hydrosalpinx, and understands that removal of this tube will result in permanent sterility. Pt understands that the present hydrosalpinx also eliminates any function in that tube.   ROS:  A complete 10 system review of systems was obtained and all systems are negative except as noted in the HPI and PMH.   Pt on menses now  Pertinent History Reviewed:   Reviewed: Significant for ectopic pregnancy surgery and right sided hydrosalpinx Medical         Past Medical History  Diagnosis Date  . Hydrosalpinx   . HSV (herpes simplex virus) infection   . Vaginal Pap smear, abnormal   . Pelvic pain in female 01/27/2014  . Menorrhagia with regular cycle 01/27/2014  . Rectal bleeding 01/27/2014  . Fecal occult blood test positive 01/27/2014  . Endometrial polyp 02/11/2014  . History of abnormal cervical Pap smear 02/11/2014                              Surgical Hx:    Past Surgical History  Procedure Laterality Date  . Ectopic pregnancy surgery Left 2005  . Colonoscopy N/A 03/23/2014    Procedure: COLONOSCOPY;  Surgeon: Meghan Binder, MD;  [facility];  Service: Endoscopy;  Laterality: N/A;  215pm - moved to 10:45 - cancellation  . Hemorrhoid banding N/A 03/23/2014    Procedure: HEMORRHOID BANDING;  Surgeon: Meghan Binder, MD;  Location: [facility];   Service: Endoscopy;  Laterality: N/A;   Medications: Reviewed & Updated - see associated section                       Current outpatient prescriptions:  .  doxycycline (DORYX) 100 MG DR capsule, Take 100 mg by mouth 2 (two) times daily., Disp: , Rfl:  .  HYDROcodone-acetaminophen (NORCO/VICODIN) 5-325 MG per tablet, 1/2 to 1 EVERY 4-6 H PRN FOR PAIN (Patient not taking: Reported on 06/08/2014), Disp: 15 tablet, Rfl: 0 .  ibuprofen (ADVIL,MOTRIN) 800 MG tablet, Take 1 tablet (800 mg total) by mouth every 8 (eight) hours as needed. (Patient taking differently: Take 800 mg by mouth every 8 (eight) hours as needed for moderate pain. ), Disp: 60 tablet, Rfl: 1 .  Lidocaine-Hydrocortisone Ace 3-2.5 % KIT, APPLY TO RECTUM QID FOR 2 WEEKS, Disp: 1 each, Rfl: 0 .  Linaclotide (LINZESS) 290 MCG CAPS capsule, Take 1 capsule (290 mcg total) by mouth daily. (Patient not taking: Reported on 06/08/2014), Disp: 30 capsule, Rfl: 11 .  pramoxine-hydrocortisone (PROCTOCREAM-HC) 1-1 % rectal cream, USE PR 4 TIMES A DAY FOR 10 DAYS. (Patient not taking: Reported on 06/08/2014), Disp: 30 g, Rfl: 0   Social History: Reviewed -  reports that she has been smoking Cigarettes.  She has a  5.5 pack-year smoking history. She has never used smokeless tobacco.  Objective Findings:  Vitals: Blood pressure 110/60, height 5' 3"  (1.6 m), weight 206 lb (93.441 kg), last menstrual period 05/18/2014.  Physical Examination:  Physical Examination: General appearance - alert, well appearing, and in no distress, oriented to person, place, and time and anxious Mental status - alert, oriented to person, place, and time, normal mood, behavior, speech, dress, motor activity, and thought processes, anxious Eyes - pupils equal and reactive, extraocular eye movements intact Abdomen - soft, nontender, nondistended, no masses or organomegaly Pelvic - normal external genitalia, vulva, vagina, cervix, uterus and adnexa, exam chaperoned by Meghan Carter., scribe  heavy menses present Neurological - alert, oriented, normal speech, no focal findings or movement disorder noted Musculoskeletal - normal     Assessment & Plan:     A: Discussion of surgical management of endometrial polyp. And Right hydrosalpinx  P:  1. Proposed surgery: hysteroscopy, Dilation and Curettage, Removal of endometrial polyp.  2 As per discussion today, will add laparoscopy, lysis of adhesions,and removal of right hydrosalpinx to surgical plan 2. Discussed the sequence of events that patient can expect to occur during her time at the hospital.    This chart was SCRIBED for Meghan Shirk, MD by Meghan Carter, ED Scribe. This patient was seen in Dr. Johnnye Carter office, and the patient's care was started at 12:45 PM.  I personally performed the services described in this documentation, which was SCRIBED in my presence. The recorded information has been reviewed and considered accurate. It has been edited as necessary during review. Meghan Kind, MD

## 2014-06-10 NOTE — Progress Notes (Signed)
Patient ID: Meghan Carter, female   DOB: 01-May-1980, 34 y.o.   MRN: [id number] Pt here today for discussion only. Pt to discuss surgery in depth with Dr. Glo Herring and see if it needs to be changed.

## 2014-06-10 NOTE — Addendum Note (Signed)
Addended by: Farley Ly on: 06/10/2014 01:45 PM   Modules accepted: Orders

## 2014-06-12 LAB — GC/CHLAMYDIA PROBE AMP
Chlamydia trachomatis, NAA: NEGATIVE
Neisseria gonorrhoeae by PCR: NEGATIVE

## 2014-06-16 ENCOUNTER — Encounter (HOSPITAL_COMMUNITY)
Admission: RE | Disposition: A | Discharge status: Home / Self Care | Payer: Self-pay | Source: Ambulatory Visit | Attending: Obstetrics and Gynecology

## 2014-06-16 ENCOUNTER — Encounter (HOSPITAL_COMMUNITY): Payer: Self-pay | Admitting: *Deleted

## 2014-06-16 ENCOUNTER — Ambulatory Visit (HOSPITAL_COMMUNITY): Payer: Self-pay | Admitting: Anesthesiology

## 2014-06-16 ENCOUNTER — Ambulatory Visit (HOSPITAL_COMMUNITY)
Admission: RE | Admit: 2014-06-16 | Discharge: 2014-06-16 | Disposition: A | Discharge status: Home / Self Care | Payer: Self-pay | Source: Ambulatory Visit | Attending: Obstetrics and Gynecology | Admitting: Obstetrics and Gynecology

## 2014-06-16 DIAGNOSIS — Z9079 Acquired absence of other genital organ(s): Secondary | ICD-10-CM | POA: Insufficient documentation

## 2014-06-16 DIAGNOSIS — N840 Polyp of corpus uteri: Secondary | ICD-10-CM

## 2014-06-16 DIAGNOSIS — Z7952 Long term (current) use of systemic steroids: Secondary | ICD-10-CM | POA: Insufficient documentation

## 2014-06-16 DIAGNOSIS — N7011 Chronic salpingitis: Secondary | ICD-10-CM | POA: Insufficient documentation

## 2014-06-16 DIAGNOSIS — Z791 Long term (current) use of non-steroidal anti-inflammatories (NSAID): Secondary | ICD-10-CM | POA: Insufficient documentation

## 2014-06-16 DIAGNOSIS — N736 Female pelvic peritoneal adhesions (postinfective): Secondary | ICD-10-CM | POA: Insufficient documentation

## 2014-06-16 DIAGNOSIS — Z8619 Personal history of other infectious and parasitic diseases: Secondary | ICD-10-CM | POA: Insufficient documentation

## 2014-06-16 DIAGNOSIS — F17200 Nicotine dependence, unspecified, uncomplicated: Secondary | ICD-10-CM | POA: Insufficient documentation

## 2014-06-16 DIAGNOSIS — Z792 Long term (current) use of antibiotics: Secondary | ICD-10-CM | POA: Insufficient documentation

## 2014-06-16 HISTORY — PX: LAPAROSCOPIC UNILATERAL SALPINGECTOMY: SHX5934

## 2014-06-16 HISTORY — PX: POLYPECTOMY: SHX5525

## 2014-06-16 HISTORY — PX: HYSTEROSCOPY WITH D & C: SHX1775

## 2014-06-16 SURGERY — DILATATION AND CURETTAGE /HYSTEROSCOPY
Anesthesia: General | Site: Vagina | Laterality: Right

## 2014-06-16 MED ORDER — NEOSTIGMINE METHYLSULFATE 10 MG/10ML IV SOLN
INTRAVENOUS | Status: DC | PRN
  Administered 2014-06-16: 2 mg via INTRAVENOUS
  Administered 2014-06-16: 1 mg via INTRAVENOUS

## 2014-06-16 MED ORDER — MIDAZOLAM HCL 2 MG/2ML IJ SOLN
INTRAMUSCULAR | Status: AC
  Filled 2014-06-16: qty 2

## 2014-06-16 MED ORDER — FENTANYL CITRATE (PF) 100 MCG/2ML IJ SOLN
INTRAMUSCULAR | Status: AC
  Filled 2014-06-16: qty 2

## 2014-06-16 MED ORDER — PROPOFOL 10 MG/ML IV BOLUS
INTRAVENOUS | Status: AC
  Filled 2014-06-16: qty 20

## 2014-06-16 MED ORDER — MIDAZOLAM HCL 2 MG/2ML IJ SOLN
1.0000 mg | INTRAMUSCULAR | Status: DC | PRN
Start: 2014-06-16 — End: 2014-06-16

## 2014-06-16 MED ORDER — CEFAZOLIN SODIUM-DEXTROSE 2-3 GM-% IV SOLR
INTRAVENOUS | Status: AC
  Filled 2014-06-16: qty 50

## 2014-06-16 MED ORDER — ROCURONIUM BROMIDE 50 MG/5ML IV SOLN
INTRAVENOUS | Status: AC
  Filled 2014-06-16: qty 1

## 2014-06-16 MED ORDER — ONDANSETRON HCL 4 MG/2ML IJ SOLN
4.0000 mg | Freq: Once | INTRAMUSCULAR | Status: AC
  Administered 2014-06-16: 4 mg via INTRAVENOUS

## 2014-06-16 MED ORDER — CEFAZOLIN SODIUM-DEXTROSE 2-3 GM-% IV SOLR
2.0000 g | INTRAVENOUS | Status: AC
  Administered 2014-06-16: 2 g via INTRAVENOUS

## 2014-06-16 MED ORDER — SODIUM CHLORIDE 0.9 % IR SOLN
Status: DC | PRN
  Administered 2014-06-16: 500 mL
  Administered 2014-06-16: 1000 mL

## 2014-06-16 MED ORDER — ROCURONIUM BROMIDE 100 MG/10ML IV SOLN
INTRAVENOUS | Status: DC | PRN
  Administered 2014-06-16: 40 mg via INTRAVENOUS

## 2014-06-16 MED ORDER — ONDANSETRON HCL 4 MG/2ML IJ SOLN
INTRAMUSCULAR | Status: AC
  Filled 2014-06-16: qty 2

## 2014-06-16 MED ORDER — HYDROCODONE-ACETAMINOPHEN 5-325 MG PO TABS: ORAL_TABLET | ORAL | Status: DC

## 2014-06-16 MED ORDER — ONDANSETRON HCL 4 MG/2ML IJ SOLN: 4.0000 mg | Freq: Once | INTRAMUSCULAR | Status: DC | PRN

## 2014-06-16 MED ORDER — GLYCOPYRROLATE 0.2 MG/ML IJ SOLN
INTRAMUSCULAR | Status: DC | PRN
  Administered 2014-06-16: 0.4 mg via INTRAVENOUS

## 2014-06-16 MED ORDER — LIDOCAINE HCL 1 % IJ SOLN
INTRAMUSCULAR | Status: DC | PRN
  Administered 2014-06-16: 30 mg via INTRADERMAL

## 2014-06-16 MED ORDER — MIDAZOLAM HCL 2 MG/2ML IJ SOLN
1.0000 mg | INTRAMUSCULAR | Status: DC | PRN
  Administered 2014-06-16: 2 mg via INTRAVENOUS

## 2014-06-16 MED ORDER — LIDOCAINE HCL (PF) 1 % IJ SOLN
INTRAMUSCULAR | Status: AC
  Filled 2014-06-16: qty 5

## 2014-06-16 MED ORDER — LACTATED RINGERS IV SOLN
INTRAVENOUS | Status: DC
  Administered 2014-06-16: 1000 mL via INTRAVENOUS

## 2014-06-16 MED ORDER — FENTANYL CITRATE (PF) 100 MCG/2ML IJ SOLN
INTRAMUSCULAR | Status: DC | PRN
  Administered 2014-06-16 (×5): 50 ug via INTRAVENOUS

## 2014-06-16 MED ORDER — FENTANYL CITRATE (PF) 100 MCG/2ML IJ SOLN
25.0000 ug | INTRAMUSCULAR | Status: DC | PRN
  Administered 2014-06-16 (×2): 50 ug via INTRAVENOUS

## 2014-06-16 MED ORDER — PROPOFOL 10 MG/ML IV BOLUS
INTRAVENOUS | Status: DC | PRN
  Administered 2014-06-16: 130 mg via INTRAVENOUS

## 2014-06-16 MED ORDER — MIDAZOLAM HCL 5 MG/5ML IJ SOLN
INTRAMUSCULAR | Status: DC | PRN
  Administered 2014-06-16: 2 mg via INTRAVENOUS

## 2014-06-16 MED ORDER — BUPIVACAINE-EPINEPHRINE (PF) 0.5% -1:200000 IJ SOLN
INTRAMUSCULAR | Status: AC
  Filled 2014-06-16: qty 30

## 2014-06-16 SURGICAL SUPPLY — 53 items
BAG HAMPER (MISCELLANEOUS) ×3 IMPLANT
BANDAGE STRIP 1X3 FLEXIBLE (GAUZE/BANDAGES/DRESSINGS) ×9 IMPLANT
BENZOIN TINCTURE PRP APPL 2/3 (GAUZE/BANDAGES/DRESSINGS) ×3 IMPLANT
BLADE SURG SZ11 CARB STEEL (BLADE) ×3 IMPLANT
CLOTH BEACON ORANGE TIMEOUT ST (SAFETY) ×3 IMPLANT
COVER LIGHT HANDLE STERIS (MISCELLANEOUS) ×6 IMPLANT
COVER MAYO STAND XLG (DRAPE) ×3 IMPLANT
DECANTER SPIKE VIAL GLASS SM (MISCELLANEOUS) ×3 IMPLANT
DRAPE PROXIMA HALF (DRAPES) ×3 IMPLANT
DRAPE STERI URO 9X17 APER PCH (DRAPES) ×3 IMPLANT
DURAPREP 26ML APPLICATOR (WOUND CARE) ×3 IMPLANT
ELECT REM PT RETURN 9FT ADLT (ELECTROSURGICAL) ×3
ELECTRODE REM PT RTRN 9FT ADLT (ELECTROSURGICAL) ×2 IMPLANT
FORMALIN 10 PREFIL 120ML (MISCELLANEOUS) ×6 IMPLANT
FORMALIN 10 PREFIL 480ML (MISCELLANEOUS) ×3 IMPLANT
GAUZE PACKING 2X5 YD STRL (GAUZE/BANDAGES/DRESSINGS) IMPLANT
GLOVE BIOGEL PI IND STRL 6.5 (GLOVE) ×2 IMPLANT
GLOVE BIOGEL PI IND STRL 9 (GLOVE) ×2 IMPLANT
GLOVE BIOGEL PI INDICATOR 6.5 (GLOVE) ×1
GLOVE BIOGEL PI INDICATOR 9 (GLOVE) ×1
GLOVE ECLIPSE 9.0 STRL (GLOVE) ×3 IMPLANT
GLOVE EXAM NITRILE MD LF STRL (GLOVE) ×6 IMPLANT
GLOVE INDICATOR 7.0 STRL GRN (GLOVE) ×3 IMPLANT
GOWN SPEC L3 XXLG W/TWL (GOWN DISPOSABLE) ×3 IMPLANT
GOWN STRL REUS W/TWL LRG LVL3 (GOWN DISPOSABLE) ×3 IMPLANT
INST SET HYSTEROSCOPY (KITS) ×3 IMPLANT
INST SET LAPROSCOPIC GYN AP (KITS) ×3 IMPLANT
IV NS 1000ML (IV SOLUTION) ×1
IV NS 1000ML BAXH (IV SOLUTION) ×2 IMPLANT
KIT ROOM TURNOVER APOR (KITS) ×3 IMPLANT
MANIFOLD NEPTUNE II (INSTRUMENTS) ×3 IMPLANT
NEEDLE INSUFFLATION 120MM (ENDOMECHANICALS) ×3 IMPLANT
NS IRRIG 1000ML POUR BTL (IV SOLUTION) ×3 IMPLANT
PACK PERI GYN (CUSTOM PROCEDURE TRAY) ×3 IMPLANT
PAD ARMBOARD 7.5X6 YLW CONV (MISCELLANEOUS) ×3 IMPLANT
PAD TELFA 3X4 1S STER (GAUZE/BANDAGES/DRESSINGS) ×3 IMPLANT
SCALPEL HARMONIC ACE (MISCELLANEOUS) ×3 IMPLANT
SET BASIN LINEN APH (SET/KITS/TRAYS/PACK) ×3 IMPLANT
SET CYSTO W/LG BORE CLAMP LF (SET/KITS/TRAYS/PACK) ×3 IMPLANT
SET IV ADMIN VERSALIGHT (MISCELLANEOUS) IMPLANT
STRIP CLOSURE SKIN 1/4X3 (GAUZE/BANDAGES/DRESSINGS) ×3 IMPLANT
SUT CHROMIC 2 0 CT 1 (SUTURE) IMPLANT
SUT PROLENE 2 0 FS (SUTURE) IMPLANT
SUT VIC AB 0 CT2 8-18 (SUTURE) ×3 IMPLANT
SUT VIC AB 3-0 SH 27 (SUTURE) ×1
SUT VIC AB 3-0 SH 27X BRD (SUTURE) ×2 IMPLANT
SYR BULB IRRIGATION 50ML (SYRINGE) ×3 IMPLANT
SYR CONTROL 10ML LL (SYRINGE) ×3 IMPLANT
TROCAR ENDO BLADELESS 11MM (ENDOMECHANICALS) ×3 IMPLANT
TROCAR XCEL NON-BLD 5MMX100MML (ENDOMECHANICALS) ×3 IMPLANT
TROCAR XCEL UNIV SLVE 11M 100M (ENDOMECHANICALS) ×3 IMPLANT
VERSALIGHT (MISCELLANEOUS) IMPLANT
WARMER LAPAROSCOPE (MISCELLANEOUS) ×3 IMPLANT

## 2014-06-16 NOTE — Transfer of Care (Signed)
Immediate Anesthesia Transfer of Care Note  Patient: Meghan Carter  Procedure(s) Performed: Procedure(s): DILATATION AND CURETTAGE /HYSTEROSCOPY (N/A) POLYPECTOMY (endometrial) (N/A) LAPAROSCOPIC UNILATERAL SALPINGECTOMY (Right)  Patient Location: PACU  Anesthesia Type:General  Level of Consciousness: sedated  Airway & Oxygen Therapy: Patient Spontanous Breathing and Patient connected to face mask oxygen  Post-op Assessment: Report given to RN, Post -op Vital signs reviewed and stable and Patient moving all extremities  Post vital signs: Reviewed and stable  Last Vitals:  Filed Vitals:   06/16/14 0730  BP: 118/68  Temp:   Resp: 18    Complications: No apparent anesthesia complications

## 2014-06-16 NOTE — Anesthesia Preprocedure Evaluation (Addendum)
Anesthesia Evaluation  Patient identified by MRN, date of birth, ID band Patient awake    Reviewed: Allergy & Precautions, NPO status , Patient's Chart, lab work & pertinent test results  Airway Mallampati: II  TM Distance: >3 FB     Dental  (+) Teeth Intact, Dental Advisory Given   Pulmonary Current Smoker,  breath sounds clear to auscultation        Cardiovascular negative cardio ROS  Rhythm:Regular Rate:Normal     Neuro/Psych    GI/Hepatic negative GI ROS,   Endo/Other    Renal/GU      Musculoskeletal   Abdominal   Peds  Hematology   Anesthesia Other Findings   Reproductive/Obstetrics                            Anesthesia Physical Anesthesia Plan  ASA: II  Anesthesia Plan: General   Post-op Pain Management:    Induction: Intravenous  Airway Management Planned: Oral ETT  Additional Equipment:   Intra-op Plan:   Post-operative Plan: Extubation in OR  Informed Consent: I have reviewed the patients History and Physical, chart, labs and discussed the procedure including the risks, benefits and alternatives for the proposed anesthesia with the patient or authorized representative who has indicated his/her understanding and acceptance.     Plan Discussed with:   Anesthesia Plan Comments:        Anesthesia Quick Evaluation

## 2014-06-16 NOTE — Anesthesia Preprocedure Evaluation (Deleted)
Anesthesia Evaluation    Airway       Dental   Pulmonary Current Smoker,          Cardiovascular     Neuro/Psych    GI/Hepatic   Endo/Other    Renal/GU      Musculoskeletal   Abdominal   Peds  Hematology   Anesthesia Other Findings   Reproductive/Obstetrics                           Anesthesia Physical Anesthesia Plan Anesthesia Quick Evaluation  

## 2014-06-16 NOTE — Anesthesia Procedure Notes (Signed)
Procedure Name: Intubation Date/Time: 06/16/2014 7:46 AM Performed by: Charmaine Downs Pre-anesthesia Checklist: Patient being monitored, Suction available, Emergency Drugs available and Patient identified Patient Re-evaluated:Patient Re-evaluated prior to inductionOxygen Delivery Method: Circle system utilized Preoxygenation: Pre-oxygenation with 100% oxygen Intubation Type: IV induction Ventilation: Mask ventilation without difficulty and Oral airway inserted - appropriate to patient size Laryngoscope Size: Mac and 3 Grade View: Grade II Tube type: Oral Tube size: 7.0 mm Number of attempts: 1 Airway Equipment and Method: Stylet Placement Confirmation: ETT inserted through vocal cords under direct vision,  positive ETCO2 and breath sounds checked- equal and bilateral Secured at: 22 cm Tube secured with: Tape Dental Injury: Teeth and Oropharynx as per pre-operative assessment

## 2014-06-16 NOTE — H&P (Signed)
[facility] Visit  Patient name: Meghan Carter [id number] Date of birth: 1980-11-03  CC & HPI:  Matalynn Shelly Rubenstein is a 34 y.o. female presenting today for discussion of surgery for management of endometrial polyps. Patient has a history of left salpingectomy. Patient has not had sexual activity with her partner since I last told her not to, but she suspects her partner has had sexual activity with other women. She is unable to remember her last STD test, and that information is not available in her medical records.the patient has a known right hydrosalpinx, and understands that removal of this tube will result in permanent sterility. Pt understands that the present hydrosalpinx also eliminates any function in that tube.  ROS:  A complete 10 system review of systems was obtained and all systems are negative except as noted in the HPI and PMH. Pt on menses now  Pertinent History Reviewed:  Reviewed: Significant for ectopic pregnancy surgery and right sided hydrosalpinx Medical  Past Medical History  Diagnosis Date  . Hydrosalpinx   . HSV (herpes simplex virus) infection   . Vaginal Pap smear, abnormal   . Pelvic pain in female 01/27/2014  . Menorrhagia with regular cycle 01/27/2014  . Rectal bleeding 01/27/2014  . Fecal occult blood test positive 01/27/2014  . Endometrial polyp 02/11/2014  . History of abnormal cervical Pap smear 02/11/2014    Surgical Hx:  Past Surgical History  Procedure Laterality Date  . Ectopic pregnancy surgery Left 2005  . Colonoscopy N/A 03/23/2014    Procedure: COLONOSCOPY; Surgeon: Danie Binder, MD; Location[facility]; Service: Endoscopy; Laterality: N/A; 215pm - moved to 10:45 - cancellation  . Hemorrhoid banding N/A 03/23/2014    Procedure: HEMORRHOID BANDING; Surgeon: Danie Binder, MD; Location[facility]; Service:  Endoscopy; Laterality: N/A;   Medications: Reviewed & Updated - see associated section   Current outpatient prescriptions:  . doxycycline (DORYX) 100 MG DR capsule, Take 100 mg by mouth 2 (two) times daily., Disp: , Rfl:  . HYDROcodone-acetaminophen (NORCO/VICODIN) 5-325 MG per tablet, 1/2 to 1 EVERY 4-6 H PRN FOR PAIN (Patient not taking: Reported on 06/08/2014), Disp: 15 tablet, Rfl: 0 . ibuprofen (ADVIL,MOTRIN) 800 MG tablet, Take 1 tablet (800 mg total) by mouth every 8 (eight) hours as needed. (Patient taking differently: Take 800 mg by mouth every 8 (eight) hours as needed for moderate pain. ), Disp: 60 tablet, Rfl: 1 . Lidocaine-Hydrocortisone Ace 3-2.5 % KIT, APPLY TO RECTUM QID FOR 2 WEEKS, Disp: 1 each, Rfl: 0 . Linaclotide (LINZESS) 290 MCG CAPS capsule, Take 1 capsule (290 mcg total) by mouth daily. (Patient not taking: Reported on 06/08/2014), Disp: 30 capsule, Rfl: 11 . pramoxine-hydrocortisone (PROCTOCREAM-HC) 1-1 % rectal cream, USE PR 4 TIMES A DAY FOR 10 DAYS. (Patient not taking: Reported on 06/08/2014), Disp: 30 g, Rfl: 0   Social History: Reviewed -  reports that she has been smoking Cigarettes. She has a 5.5 pack-year smoking history. She has never used smokeless tobacco.  Objective Findings:  Vitals: Blood pressure 110/60, height 5' 3"  (1.6 m), weight 206 lb (93.441 kg), last menstrual period 05/18/2014.  Physical Examination:  Physical Examination: General appearance - alert, well appearing, and in no distress, oriented to person, place, and time and anxious Mental status - alert, oriented to person, place, and time, normal mood, behavior, speech, dress, motor activity, and thought processes, anxious Eyes - pupils equal and reactive, extraocular eye movements intact Abdomen - soft, nontender, nondistended, no  masses or organomegaly Pelvic - normal external genitalia, vulva, vagina, cervix, uterus and adnexa, exam chaperoned by Marin Comment., scribe  heavy menses present Neurological - alert, oriented, normal speech, no focal findings or movement disorder noted Musculoskeletal - normal     Assessment & Plan:    A: Discussion of surgical management of endometrial polyp. And Right hydrosalpinx  P:  1. Proposed surgery: hysteroscopy, Dilation and Curettage, Removal of endometrial polyp. 2 As per discussion today, will add laparoscopy, lysis of adhesions,and removal of right hydrosalpinx to surgical plan 2. Discussed the sequence of events that patient can expect to occur during her time at the hospital.    This chart was SCRIBED for Mallory Shirk, MD by Stephania Fragmin, ED Scribe. This patient was seen in Dr. Johnnye Sima office, and the patient's care was started at 12:45 PM.  I personally performed the services described in this documentation, which was SCRIBED in my presence. The recorded information has been reviewed and considered accurate. It has been edited as necessary during review. Jonnie Kind, MD

## 2014-06-16 NOTE — Op Note (Signed)
Please see the details of the procedure in the brief operative note.

## 2014-06-16 NOTE — Discharge Instructions (Signed)

## 2014-06-16 NOTE — Anesthesia Postprocedure Evaluation (Signed)
  Anesthesia Post-op Note  Patient: Meghan Carter  Procedure(s) Performed: Procedure(s): DILATATION AND CURETTAGE /HYSTEROSCOPY (N/A) POLYPECTOMY (endometrial) (N/A) LAPAROSCOPIC UNILATERAL SALPINGECTOMY (Right)  Patient Location: PACU  Anesthesia Type:General  Level of Consciousness: awake, alert , oriented and patient cooperative  Airway and Oxygen Therapy: Patient Spontanous Breathing  Post-op Pain: 3 /10, mild  Post-op Assessment: Post-op Vital signs reviewed, Patient's Cardiovascular Status Stable, Respiratory Function Stable, Patent Airway and No signs of Nausea or vomiting  Post-op Vital Signs: Reviewed and stable  Last Vitals:  Filed Vitals:   06/16/14 0907  BP: 132/77  Temp: 36.9 C  Resp: 15    Complications: No apparent anesthesia complications

## 2014-06-16 NOTE — Brief Op Note (Signed)
06/16/2014  9:19 AM  PATIENT:  Meghan Carter  34 y.o. female  PRE-OPERATIVE DIAGNOSIS:  endometrial polyp, right hydrosalpinx  POST-OPERATIVE DIAGNOSIS:  pelvic adhesions  PROCEDURE:  Procedure(s): DILATATION AND CURETTAGE /HYSTEROSCOPY (N/A) POLYPECTOMY (endometrial) (N/A) LAPAROSCOPIC UNILATERAL SALPINGECTOMY (Right)  SURGEON:  Surgeon(s) and Role:    * Jonnie Kind, MD - Primary  PHYSICIAN ASSISTANT:   ASSISTANTS: Kendrick, CST   ANESTHESIA:   general  EBL:  Total I/O In: 700 [I.V.:700] Out: 150 [Urine:150]  BLOOD ADMINISTERED:none  DRAINS: none   LOCAL MEDICATIONS USED:  NONE  SPECIMEN:  Source of Specimen:  Endometrial polyp, endometrial curettings, right hydrosalpinx  DISPOSITION OF SPECIMEN:  PATHOLOGY  COUNTS:  YES  TOURNIQUET:  * No tourniquets in log *  DICTATION: .Dragon Dictation  PLAN OF CARE: Discharge to home after PACU  PATIENT DISPOSITION:  PACU - hemodynamically stable.   Delay start of Pharmacological VTE agent (>24hrs) due to surgical blood loss or risk of bleeding: not applicable Details of procedure: Patient was taken operating room prepped and draped for combined vaginal and abdominal surgery. Foley catheter was in place, timeout was conducted, Ancef administered, and procedure initiated by an infraumbilical vertical 1 cm skin incision at the site of prior laparoscopy, with transverse suprapubic and right lower quadrant skin incisions made. Needle was introduced through the umbilicus without difficulty and pneumoperitoneum achieved under normal low pressures. The laparoscopic weighted trocar was then introduced through the umbilicus without difficulty, and attention directed to the pelvis. The bowel was grossly normal. There was some small right lower quadrant adhesions from the epiploic fat of the ascending colon to the pelvic sidewall but otherwise pelvis was topically quite normal. Upon placing in Trendelenburg and we could see that the  right adnexa had a hydrosalpinx and distal dilation of the tube and the right ovary was thinly adherent to the pelvic sidewall. Associated evidence of prior partial salpingectomy from previous ectopic. The left ovary was grossly normal and uterus was small and well supported. Cul-de-sac was inspected and no evidence of endometriosis noted. Photos were taken to document this. She was then directed to the right adnexa and with the pubic limb millimeter trocar and the 5 mm right lower quadrant trocar we were able to grasp the tube and used the Harmonic scalpel to perform salpingectomy. Prior to completion of the salpingectomy effort was taken to disrupt the thin adhesions on the ovary to the sidewall. This was successful. At the end of the procedure the right ovary was significantly more mobile than previously noted. The axilla was hemostatic and procedure considered completed with deflation of the abdomen, instilling 200 cc of saline in the abdomen to assist with evacuation of pneumoperitoneum, then laparoscopic instruments removed and fascia closed at the umbilicus and suprapubic site was 0 Vicryl using tractors for optimal visualization. The cutaneous closure of all 3 sites with 4-0 Vicryl followed. Needle counts were correct. Hysteroscopy. Cervix was grasped with single-tooth tenaculum and the uterus sounded to 7/2 cm, and dilated to 23 [place]. The bulk of the polyp fell out during this part of the procedure having been disrupted by the use the uterine manipulation during the laparoscopy. The hysteroscopic evaluation the uterus showed the stump of the polyp originating from the left cornual area. The remaining stump of the polyp was removed with hysteroscopic scissors and biopsy instrument. Smooth curettage and obtained minimal tissue. There was no suspicion of complications during the procedure. The patient then was ligated recovery room in stable condition be  discharged home. Sponge and needle counts correct

## 2014-06-17 ENCOUNTER — Encounter (HOSPITAL_COMMUNITY): Payer: Self-pay | Admitting: Obstetrics and Gynecology

## 2014-06-17 ENCOUNTER — Telehealth: Payer: Self-pay | Admitting: Obstetrics and Gynecology

## 2014-06-17 MED ORDER — KETOROLAC TROMETHAMINE 10 MG PO TABS: 10.0000 mg | ORAL_TABLET | Freq: Four times a day (QID) | ORAL | Status: DC | PRN

## 2014-06-17 NOTE — Telephone Encounter (Signed)
Pt aware that Dr. Glo Herring sent another pain medication to take along with the vicodin. Pt verbalized understanding and will pick up medication.

## 2014-06-17 NOTE — Telephone Encounter (Signed)
Continue vicodin and add toradol along with.

## 2014-06-22 ENCOUNTER — Ambulatory Visit: Payer: Self-pay | Admitting: Gastroenterology

## 2014-06-23 ENCOUNTER — Encounter: Payer: Self-pay | Admitting: Obstetrics and Gynecology

## 2014-06-23 ENCOUNTER — Ambulatory Visit (INDEPENDENT_AMBULATORY_CARE_PROVIDER_SITE_OTHER): Payer: Self-pay | Admitting: Obstetrics and Gynecology

## 2014-06-23 VITALS — BP 120/70 | Ht 63.0 in | Wt 213.5 lb

## 2014-06-23 DIAGNOSIS — Z09 Encounter for follow-up examination after completed treatment for conditions other than malignant neoplasm: Secondary | ICD-10-CM

## 2014-06-23 DIAGNOSIS — Z9889 Other specified postprocedural states: Secondary | ICD-10-CM

## 2014-06-23 NOTE — Progress Notes (Signed)
Patient ID: Meghan Carter, female   DOB: 16-May-1980, 34 y.o.   MRN: [id number] Pt here today for post op visit. Pt states that she has already been sexually active. Pt states that she was having bad pain after the pt and her husband had intercourse. Pt states that her pain today could be rated a 7-8 today right now. Pt states that she is walking better and faster than she was. Pt states that she has noticed an odor but denies any discharge.   Subjective:  Meghan Carter is a 34 y.o. female now 1 weeks status post diagnostic hysteroscopy and laparoscopy with right salpingectomy.    the patient continues to trade one complaint for a new one.  Now she is 1 anxious to remove the steristrips-- it went fine, and  2--Concerned over pelvic pain with intercourse, stating that sex on day 5 postop caused her to start to have pelvic ache. And 3.-- unable to achieve orgasm and concerned(?). Apparently this has been a chronic unspoken concern. Review of Systems Negative except  r diet:   regular   Bowel movements : normal.  Pain is controlled with current analgesics. Medications being used: toradol x 5 d postop.  Objective:  BP 120/70 mmHg  Ht 5\' 3"  (1.6 m)  Wt 213 lb 8 oz (96.843 kg)  BMI 37.83 kg/m2  LMP 06/09/2014 General:Well developed, well nourished.  No acute distress. Abdomen: Bowel sounds normal, soft, non-tender. Pelvic Exam:    External Genitalia:  Normal.    Vagina: Normal    Cervix: Normal. The cervix is nonpurulent, but pt tolerated cx contact on bimaual very poorly    Uterus: Normal    Adnexa/Bimanual: Normal no masses.  Vaginal sidewall is nontender as initial part of exam  Incision(s):   Healing perfectly, no drainage, no erythema, no hernia, no swelling, no dehiscence,     Assessment:  Post-Op 1 weeks s/p operative hysteroscopy and and laparoscopy with right salpingectomy   Low pain threshold  Unrealistic surgical expectations Doing well postoperatively.   Plan:   1.Wound care discussed  Return to work next week  2. . current medications.toradol then ibuprofen 3. Activity restrictions: none 4. return to work: 1-2 weeks.Next Monday, given note 5. Follow up in 4 weeks.

## 2014-07-02 ENCOUNTER — Telehealth: Payer: Self-pay | Admitting: *Deleted

## 2014-07-02 ENCOUNTER — Ambulatory Visit: Payer: Self-pay | Admitting: Nurse Practitioner

## 2014-07-02 NOTE — Telephone Encounter (Signed)
Complains of pain and odor, she is sp surgery, to come in tomorrow to see Dr Elonda Husky at 12:45pm

## 2014-07-03 ENCOUNTER — Encounter: Payer: Self-pay | Admitting: Obstetrics & Gynecology

## 2014-07-03 ENCOUNTER — Ambulatory Visit (INDEPENDENT_AMBULATORY_CARE_PROVIDER_SITE_OTHER): Payer: Self-pay | Admitting: Obstetrics & Gynecology

## 2014-07-03 VITALS — BP 100/70 | HR 80 | Ht 63.0 in | Wt 206.0 lb

## 2014-07-03 DIAGNOSIS — R102 Pelvic and perineal pain: Secondary | ICD-10-CM

## 2014-07-03 DIAGNOSIS — N719 Inflammatory disease of uterus, unspecified: Secondary | ICD-10-CM

## 2014-07-03 MED ORDER — HYDROCODONE-ACETAMINOPHEN 5-325 MG PO TABS: ORAL_TABLET | ORAL | Status: DC

## 2014-07-03 MED ORDER — METRONIDAZOLE 500 MG PO TABS: 500.0000 mg | ORAL_TABLET | Freq: Two times a day (BID) | ORAL | Status: DC

## 2014-07-03 MED ORDER — CIPROFLOXACIN HCL 500 MG PO TABS: 500.0000 mg | ORAL_TABLET | Freq: Two times a day (BID) | ORAL | Status: DC

## 2014-07-03 NOTE — Progress Notes (Signed)
Patient ID: Meghan Carter, female   DOB: Jun 12, 1980, 34 y.o.   MRN: [id number]       Chief Complaint  Patient presents with  . gyn visit    c/c cramping/ odor.had ablation on 5/ 10/ 16    The patient's surgical note and post operative visit from May 17 are reviewed HPI:    34 y.o. G1P0010 Patient's last menstrual period was 06/09/2014.  Patient has been having ongoing intercourse despite continued watery bloody uterine discharge which represents sloughed thermally injured endometrium. Her pain his significantly increased over the past day or so. Location:  pelvic. Quality:  Severe cramps. Severity:  Moderate to severe. Timing:  episodic. Duration:  Few days. Context:  . Modifying factors:   Signs/Symptoms:      Current outpatient prescriptions:  .  HYDROcodone-acetaminophen (NORCO/VICODIN) 5-325 MG per tablet, 1/2 to 1 EVERY 4-6 H PRN FOR PAIN (Patient not taking: Reported on 06/23/2014), Disp: 10 tablet, Rfl: 0 .  ibuprofen (ADVIL,MOTRIN) 800 MG tablet, Take 1 tablet (800 mg total) by mouth every 8 (eight) hours as needed. (Patient not taking: Reported on 06/23/2014), Disp: 60 tablet, Rfl: 1 .  ketorolac (TORADOL) 10 MG tablet, Take 1 tablet (10 mg total) by mouth every 6 (six) hours as needed. (Patient not taking: Reported on 07/03/2014), Disp: 20 tablet, Rfl: 0 .  Linaclotide (LINZESS) 290 MCG CAPS capsule, Take 1 capsule (290 mcg total) by mouth daily. (Patient not taking: Reported on 06/08/2014), Disp: 30 capsule, Rfl: 11 .  pramoxine-hydrocortisone (PROCTOCREAM-HC) 1-1 % rectal cream, USE PR 4 TIMES A DAY FOR 10 DAYS. (Patient not taking: Reported on 06/08/2014), Disp: 30 g, Rfl: 0  Problem Pertinent ROS:       No burning with urination, frequency or urgency No nausea, vomiting or diarrhea Nor fever chills or other constitutional symptoms   Extended ROS:        [place]:             Past Medical History  Diagnosis Date  . Hydrosalpinx   . HSV (herpes simplex virus)  infection   . Vaginal Pap smear, abnormal   . Pelvic pain in female 01/27/2014  . Menorrhagia with regular cycle 01/27/2014  . Rectal bleeding 01/27/2014  . Fecal occult blood test positive 01/27/2014  . Endometrial polyp 02/11/2014  . History of abnormal cervical Pap smear 02/11/2014    Past Surgical History  Procedure Laterality Date  . Ectopic pregnancy surgery Left 2005  . Colonoscopy N/A 03/23/2014    SLF: 1. The examined terminal ileum appeared to be normal. 2. The left colon is redundant 3. Moderate sized internal hemorrhoids  . Hemorrhoid banding N/A 03/23/2014    Procedure: HEMORRHOID BANDING;  Surgeon: Danie Binder, MD;  Location: [facility];  Service: Endoscopy;  Laterality: N/A;  . Hysteroscopy w/d&c N/A 06/16/2014    Procedure: DILATATION AND CURETTAGE /HYSTEROSCOPY;  Surgeon: Jonnie Kind, MD;  Location: [facility];  Service: Gynecology;  Laterality: N/A;  . Polypectomy N/A 06/16/2014    Procedure: POLYPECTOMY (endometrial);  Surgeon: Jonnie Kind, MD;  Location: [facility];  Service: Gynecology;  Laterality: N/A;  . Laparoscopic unilateral salpingectomy Right 06/16/2014    Procedure: LAPAROSCOPIC UNILATERAL SALPINGECTOMY;  Surgeon: Jonnie Kind, MD;  Location: [facility];  Service: Gynecology;  Laterality: Right;    OB History    Gravida Para Term Preterm AB TAB SAB Ectopic Multiple Living   1    1   1  No Known Allergies  History   Social History  . Marital Status: Married    Spouse Name: N/A  . Number of Children: N/A  . Years of Education: N/A   Social History Main Topics  . Smoking status: Current Some Day Smoker -- 0.25 packs/day for 22 years    Types: Cigarettes  . Smokeless tobacco: Never Used  . Alcohol Use: Yes     Comment: occ  . Drug Use: No  . Sexual Activity: Yes    Birth Control/ Protection: None   Other Topics Concern  . None   Social History Narrative    Family History  Problem Relation Age of Onset  . Diabetes Father   .  Diabetes Paternal Grandmother   . Diabetes Paternal Grandfather   . Colon cancer Neg Hx   . Inflammatory bowel disease Neg Hx      Examination:  Vitals:  Blood pressure 100/70, pulse 80, height 5\' 3"  (1.6 m), weight 206 lb (93.441 kg), last menstrual period 06/09/2014.    Physical Examination:     gen WDWN female in mild to moderate distress  Vulva:  normal appearing vulva with no masses, tenderness or lesions Vagina:  normal mucosa, scant blood, no lesions or discharge noted Cervix:  cervical motion tenderness and no lesions Uterus:  tender on motion and difficult to examine due to pain Adnexa: ovaries:,  normal adnexa in size, nontender and no masses     DATA orders and reviews: Labs were not ordered today:   Imaging studies were not ordered today:    Lab tests were reviewed today:   Peri op Imaging studies were reviewed today:  Peri op  I did independently review/view images, tracing or specimen(not simply the report) myself.  Prescription Drug Management:  New Prescriptions: Cipro 500 BID x 10 days, flagyl 500 mg BID x 10 days Renewed Prescriptions:  lortab Current prescription changes:     Impression/Plan(Problem Based): 1.  Post operative endometritis      (new problem) : Additional workup is not needed:  Antibiotics given, pelvic rest until follow up with Dr Glo Herring in 2 weeks   Follow Up:   2  weeks

## 2014-07-20 ENCOUNTER — Ambulatory Visit: Payer: Self-pay | Admitting: Gastroenterology

## 2014-07-23 ENCOUNTER — Ambulatory Visit: Payer: Self-pay | Admitting: Gastroenterology

## 2014-07-24 ENCOUNTER — Ambulatory Visit: Payer: Self-pay | Admitting: Obstetrics and Gynecology

## 2014-07-28 ENCOUNTER — Telehealth: Payer: Self-pay | Admitting: Nurse Practitioner

## 2014-07-28 ENCOUNTER — Ambulatory Visit: Payer: Self-pay | Admitting: Nurse Practitioner

## 2014-07-28 NOTE — Telephone Encounter (Signed)
Pt was a no show

## 2014-07-29 ENCOUNTER — Ambulatory Visit: Payer: Self-pay | Admitting: Obstetrics and Gynecology

## 2014-07-29 NOTE — Telephone Encounter (Signed)
Noted  

## 2014-08-07 ENCOUNTER — Telehealth: Payer: Self-pay | Admitting: Obstetrics and Gynecology

## 2014-08-07 ENCOUNTER — Ambulatory Visit: Payer: Self-pay | Admitting: Obstetrics and Gynecology

## 2014-08-18 ENCOUNTER — Encounter: Payer: Self-pay | Admitting: Obstetrics and Gynecology

## 2015-02-07 DIAGNOSIS — Z5189 Encounter for other specified aftercare: Secondary | ICD-10-CM

## 2015-02-07 HISTORY — DX: Encounter for other specified aftercare: Z51.89

## 2015-02-24 ENCOUNTER — Ambulatory Visit: Payer: Self-pay | Admitting: Women's Health

## 2015-03-17 DIAGNOSIS — N9419 Other specified dyspareunia: Secondary | ICD-10-CM | POA: Insufficient documentation

## 2015-03-17 DIAGNOSIS — N9489 Other specified conditions associated with female genital organs and menstrual cycle: Secondary | ICD-10-CM | POA: Insufficient documentation

## 2015-03-17 DIAGNOSIS — M6289 Other specified disorders of muscle: Secondary | ICD-10-CM

## 2015-03-17 HISTORY — DX: Other specified disorders of muscle: M62.89

## 2015-03-17 HISTORY — DX: Other specified conditions associated with female genital organs and menstrual cycle: N94.89

## 2015-03-26 ENCOUNTER — Observation Stay (HOSPITAL_COMMUNITY): Payer: PRIVATE HEALTH INSURANCE

## 2015-03-26 ENCOUNTER — Telehealth: Payer: Self-pay

## 2015-03-26 ENCOUNTER — Encounter (HOSPITAL_COMMUNITY): Payer: Self-pay | Admitting: Emergency Medicine

## 2015-03-26 ENCOUNTER — Observation Stay (HOSPITAL_COMMUNITY)
Admission: EM | Admit: 2015-03-26 | Discharge: 2015-03-28 | Disposition: A | Discharge status: Home / Self Care | Payer: PRIVATE HEALTH INSURANCE | Attending: Family Medicine | Admitting: Family Medicine

## 2015-03-26 DIAGNOSIS — K625 Hemorrhage of anus and rectum: Secondary | ICD-10-CM

## 2015-03-26 DIAGNOSIS — F17210 Nicotine dependence, cigarettes, uncomplicated: Secondary | ICD-10-CM | POA: Diagnosis not present

## 2015-03-26 DIAGNOSIS — D509 Iron deficiency anemia, unspecified: Secondary | ICD-10-CM | POA: Insufficient documentation

## 2015-03-26 DIAGNOSIS — D649 Anemia, unspecified: Secondary | ICD-10-CM | POA: Insufficient documentation

## 2015-03-26 DIAGNOSIS — R1030 Lower abdominal pain, unspecified: Secondary | ICD-10-CM

## 2015-03-26 DIAGNOSIS — Z6831 Body mass index (BMI) 31.0-31.9, adult: Secondary | ICD-10-CM | POA: Diagnosis not present

## 2015-03-26 DIAGNOSIS — E669 Obesity, unspecified: Secondary | ICD-10-CM | POA: Insufficient documentation

## 2015-03-26 DIAGNOSIS — D62 Acute posthemorrhagic anemia: Secondary | ICD-10-CM

## 2015-03-26 DIAGNOSIS — F1010 Alcohol abuse, uncomplicated: Secondary | ICD-10-CM | POA: Insufficient documentation

## 2015-03-26 DIAGNOSIS — R531 Weakness: Secondary | ICD-10-CM | POA: Insufficient documentation

## 2015-03-26 DIAGNOSIS — K648 Other hemorrhoids: Secondary | ICD-10-CM | POA: Diagnosis not present

## 2015-03-26 DIAGNOSIS — R5383 Other fatigue: Secondary | ICD-10-CM | POA: Insufficient documentation

## 2015-03-26 DIAGNOSIS — R109 Unspecified abdominal pain: Secondary | ICD-10-CM | POA: Insufficient documentation

## 2015-03-26 HISTORY — DX: Acute posthemorrhagic anemia: D62

## 2015-03-26 LAB — CBC WITH DIFFERENTIAL/PLATELET
Basophils Absolute: 0 10*3/uL (ref 0.0–0.1)
Basophils Relative: 1 %
EOS ABS: 0.2 10*3/uL (ref 0.0–0.7)
EOS PCT: 3 %
HCT: 21.8 % — ABNORMAL LOW (ref 36.0–46.0)
Hemoglobin: 6.4 g/dL — CL (ref 12.0–15.0)
Lymphocytes Relative: 35 %
Lymphs Abs: 1.7 10*3/uL (ref 0.7–4.0)
MCH: 21.3 pg — ABNORMAL LOW (ref 26.0–34.0)
MCHC: 29.4 g/dL — AB (ref 30.0–36.0)
MCV: 72.7 fL — ABNORMAL LOW (ref 78.0–100.0)
MONO ABS: 0.7 10*3/uL (ref 0.1–1.0)
Monocytes Relative: 14 %
Neutro Abs: 2.3 10*3/uL (ref 1.7–7.7)
Neutrophils Relative %: 47 %
PLATELETS: 332 10*3/uL (ref 150–400)
RBC: 3 MIL/uL — ABNORMAL LOW (ref 3.87–5.11)
RDW: 16.1 % — ABNORMAL HIGH (ref 11.5–15.5)
WBC: 5 10*3/uL (ref 4.0–10.5)

## 2015-03-26 LAB — PROTIME-INR
INR: 0.97 (ref 0.00–1.49)
Prothrombin Time: 13.1 seconds (ref 11.6–15.2)

## 2015-03-26 LAB — COMPREHENSIVE METABOLIC PANEL
ALT: 12 U/L — ABNORMAL LOW (ref 14–54)
ANION GAP: 6 (ref 5–15)
AST: 17 U/L (ref 15–41)
Albumin: 3.8 g/dL (ref 3.5–5.0)
Alkaline Phosphatase: 54 U/L (ref 38–126)
BILIRUBIN TOTAL: 0.2 mg/dL — AB (ref 0.3–1.2)
BUN: 12 mg/dL (ref 6–20)
CO2: 21 mmol/L — AB (ref 22–32)
Calcium: 8.6 mg/dL — ABNORMAL LOW (ref 8.9–10.3)
Chloride: 110 mmol/L (ref 101–111)
Creatinine, Ser: 0.65 mg/dL (ref 0.44–1.00)
GFR calc Af Amer: >60 mL/min (ref >60)
GFR calc non Af Amer: >60 mL/min (ref >60)
Glucose, Bld: 95 mg/dL (ref 65–99)
Potassium: 4.2 mmol/L (ref 3.5–5.1)
SODIUM: 137 mmol/L (ref 135–145)
TOTAL PROTEIN: 6.8 g/dL (ref 6.5–8.1)

## 2015-03-26 LAB — APTT: aPTT: 25 seconds (ref 24–37)

## 2015-03-26 LAB — PREPARE RBC (CROSSMATCH)

## 2015-03-26 LAB — ABO/RH: ABO/RH(D): A POS

## 2015-03-26 IMAGING — NM NM BOWEL IMG MECKELS
3 series · 18 of 18 positions shown · non-contrast
Comparison: CT abdomen [DATE]

CLINICAL DATA: Abdominal pain. Bloody stool. Six year history of
abdominal pain.

EXAM:
NUCLEAR MEDICINE MECKELS SCAN
TECHNIQUE: Sequential abdominal images were obtained following intravenous
injection of radiopharmaceutical.
RADIOPHARMACEUTICALS:  11 millicuries technetium pertechnetate.

[Series 1: dynamic 1 at 2_17_(id) 3_57_36 pm · 6 of 10 frames shown]
[frame 1/10]
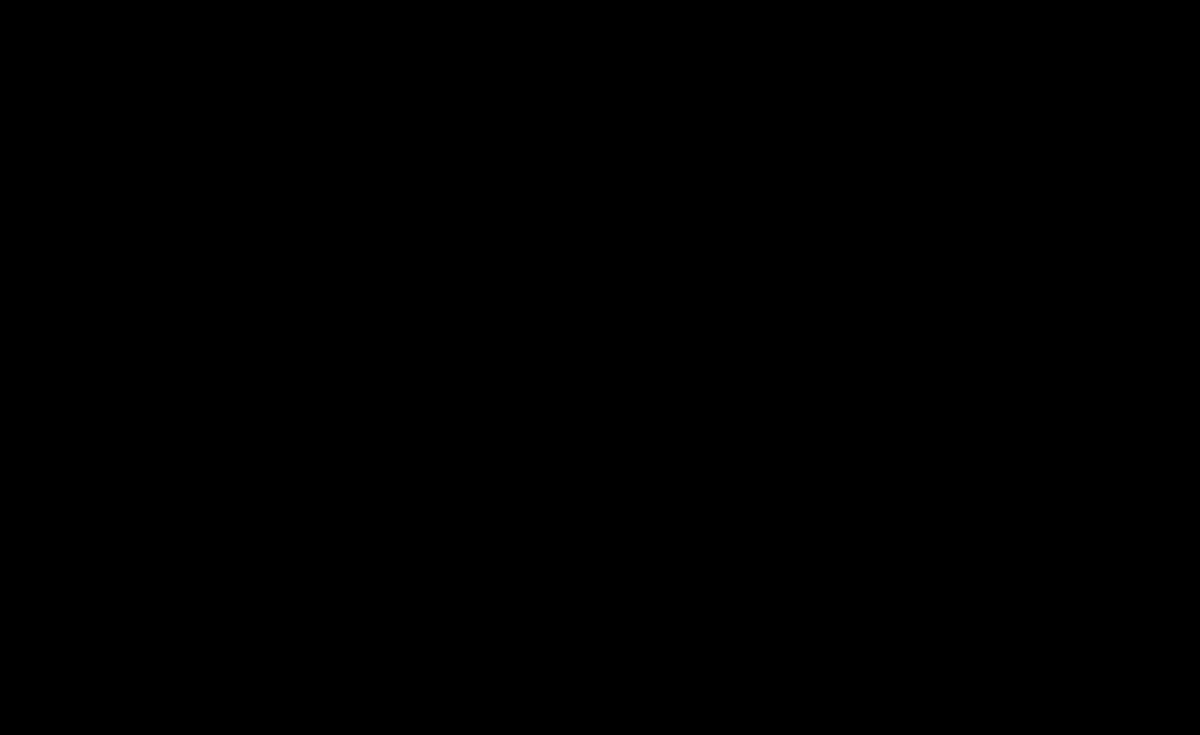
[frame 3/10]
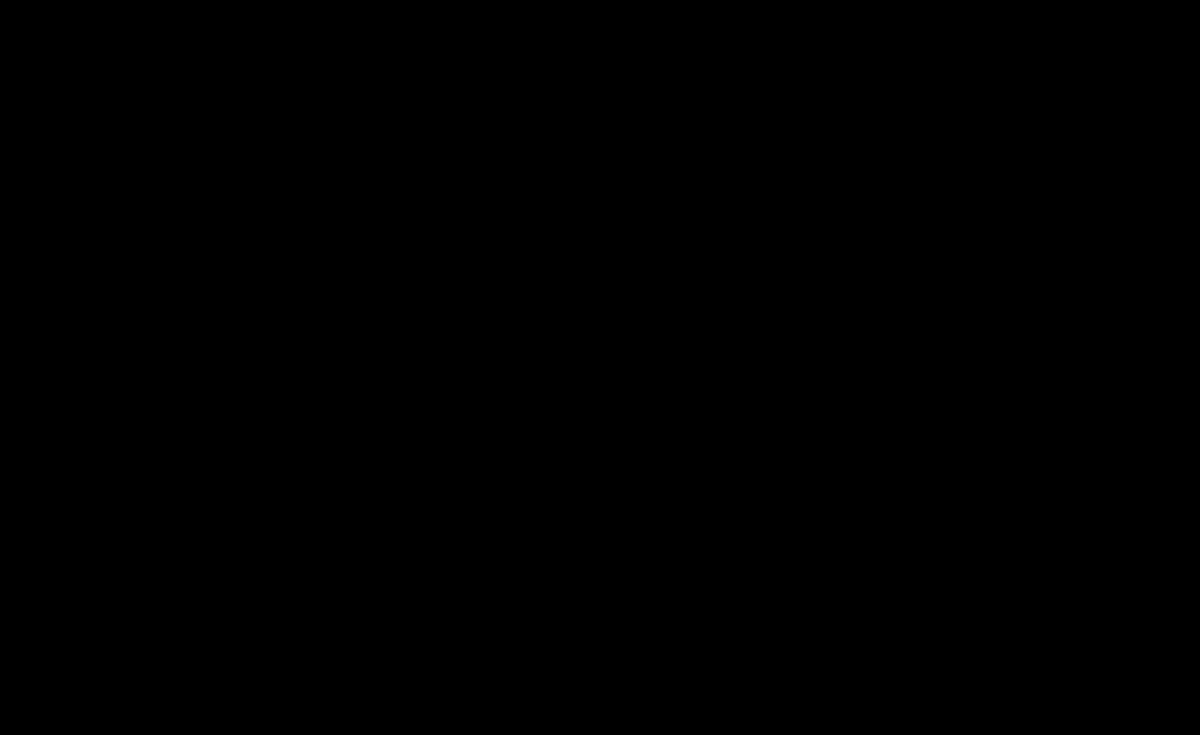
[frame 5/10]
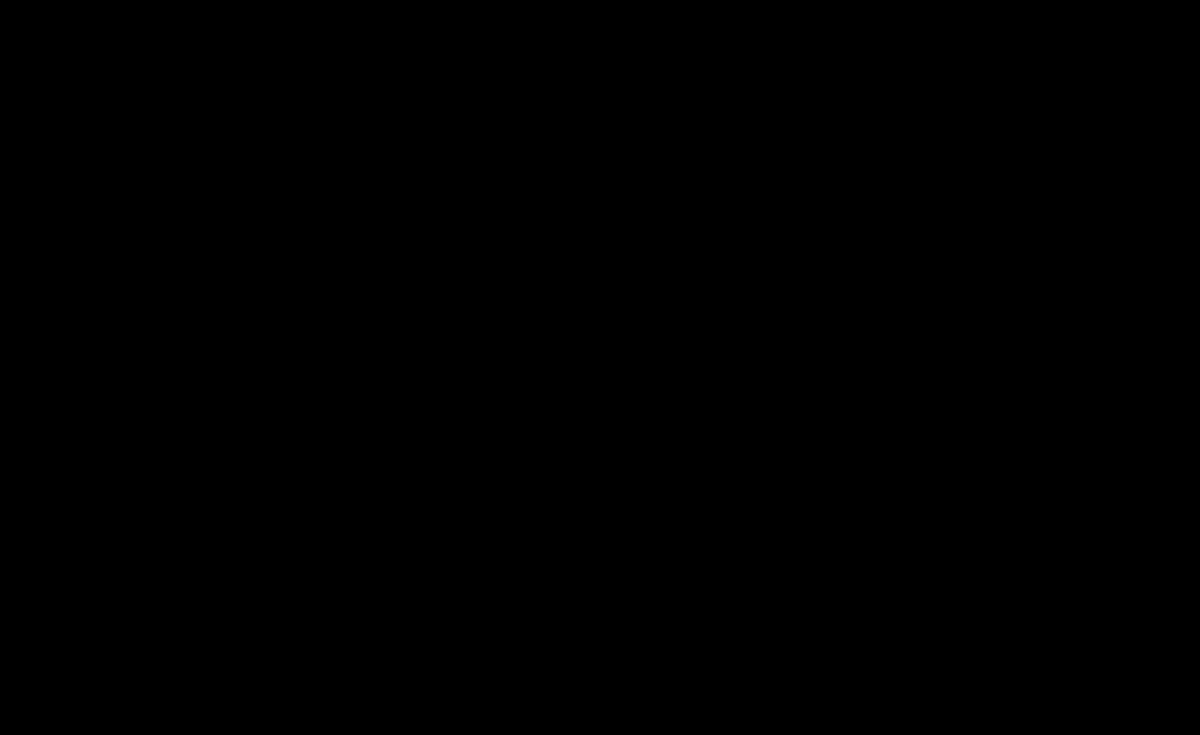
[frame 6/10]
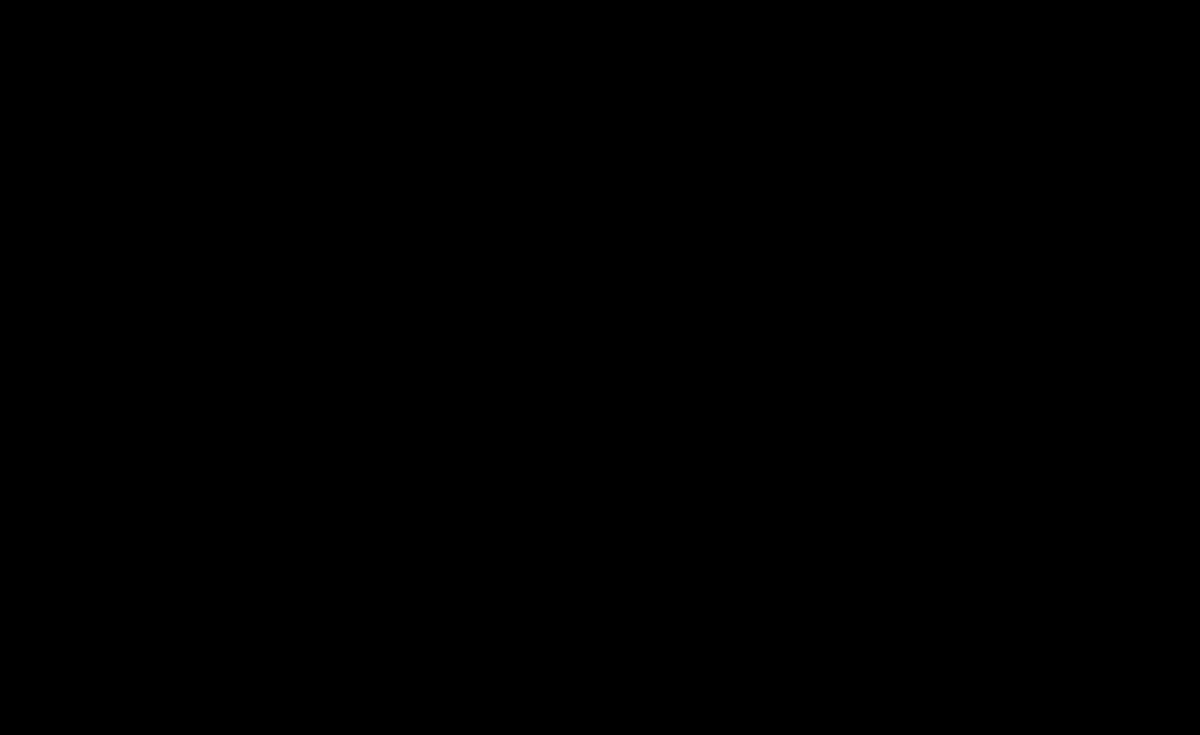
[frame 8/10]
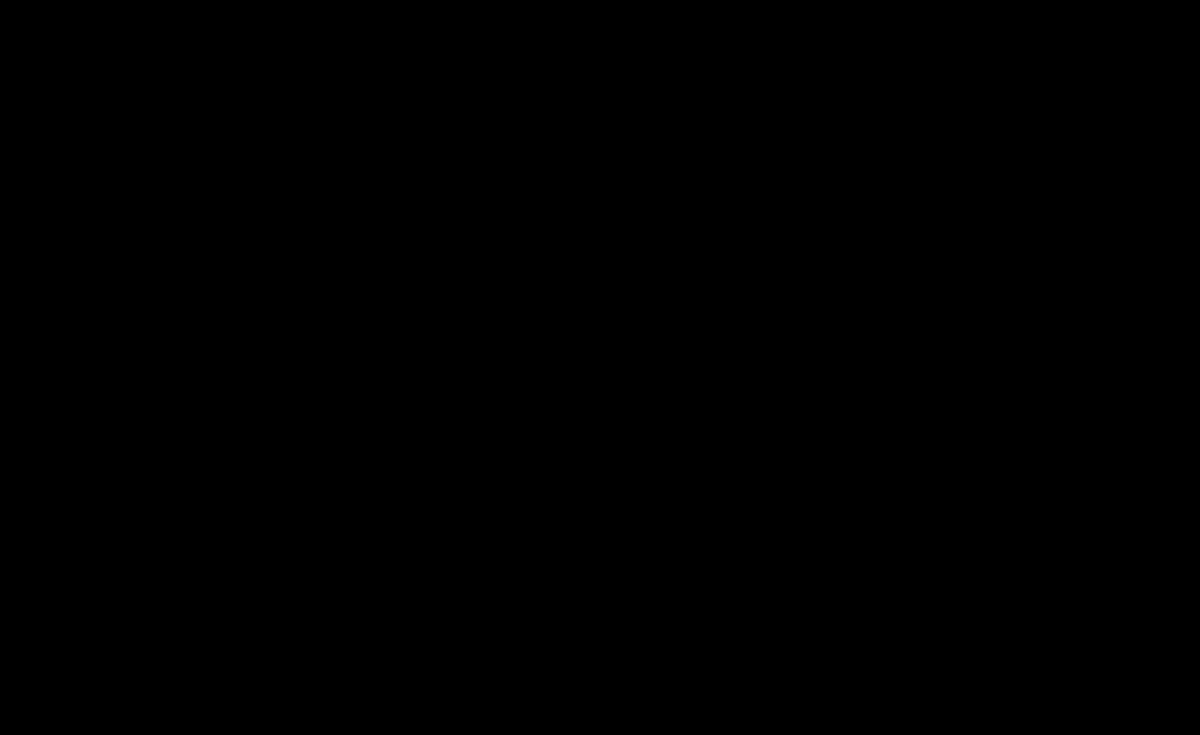
[frame 10/10]
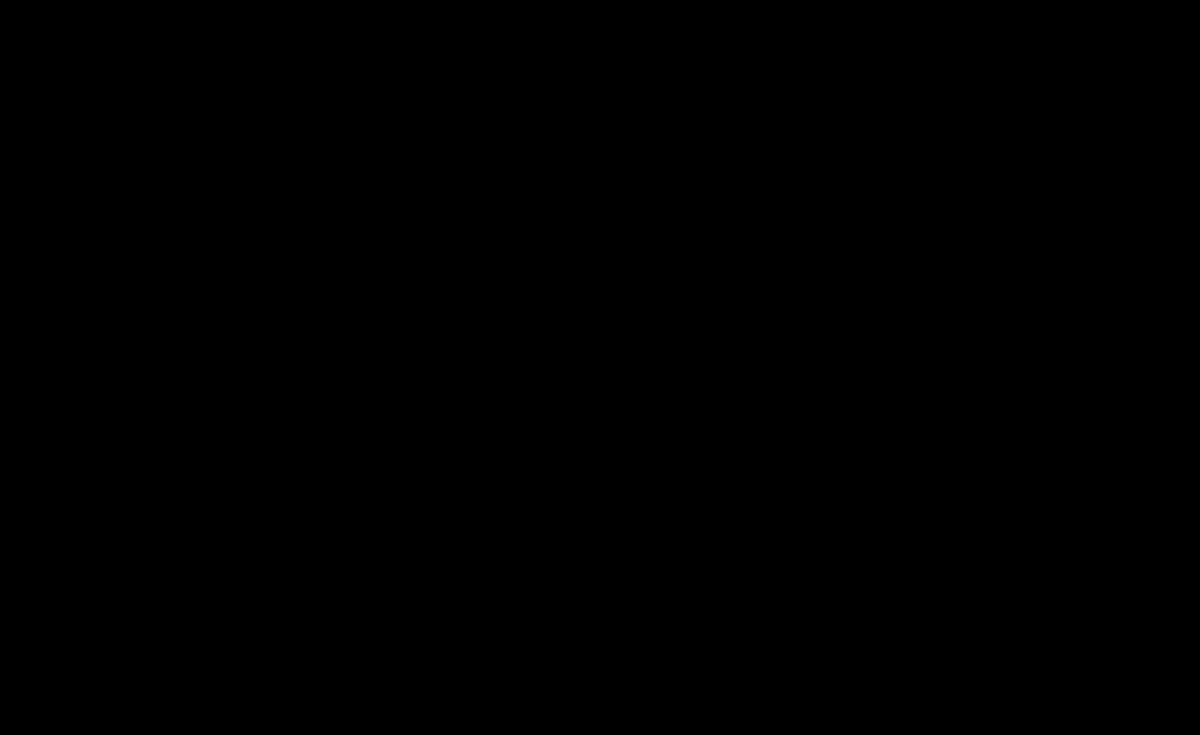

[Series 1: meckels dynamic · 4.14mm/px · 6 of 30 frames shown (1 of 2)]
[frame 3/30]
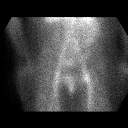
[frame 8/30]
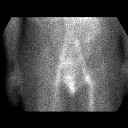
[frame 13/30]
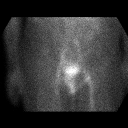
[frame 18/30]
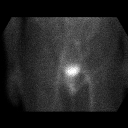
[frame 23/30]
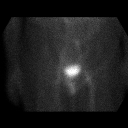
[frame 28/30]
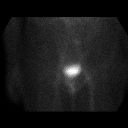

[Series 2: meckels dynamic · 4.14mm/px · 6 of 15 frames shown (2 of 2)]
[frame 2/15]
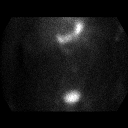
[frame 4/15]
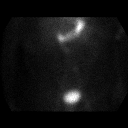
[frame 7/15]
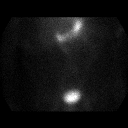
[frame 9/15]
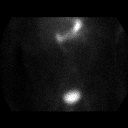
[frame 12/15]
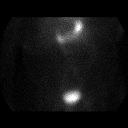
[frame 14/15]
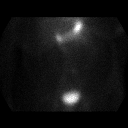

[18 of 18 positions shown; findings below may reference images not displayed]

FINDINGS: No accumulation of radiotracer within the abdomen or pelvis to
localize a ectopic gastric mucosa. Physiologic activity is noted in
the stomach and GU tract. Blood pool activity noted.
IMPRESSION: No evidence of ectopic gastric mucosa (Meckel's diverticulum).

## 2015-03-26 MED ORDER — ACETAMINOPHEN 325 MG PO TABS
650.0000 mg | ORAL_TABLET | Freq: Four times a day (QID) | ORAL | Status: DC | PRN
Start: 2015-03-26 — End: 2015-03-28
  Administered 2015-03-27: 650 mg via ORAL
  Filled 2015-03-26: qty 2

## 2015-03-26 MED ORDER — THIAMINE HCL 100 MG/ML IJ SOLN: 100.0000 mg | Freq: Every day | INTRAMUSCULAR | Status: DC

## 2015-03-26 MED ORDER — ADULT MULTIVITAMIN W/MINERALS CH
1.0000 | ORAL_TABLET | Freq: Every day | ORAL | Status: DC
  Administered 2015-03-26 – 2015-03-28 (×2): 1 via ORAL
  Filled 2015-03-26 (×2): qty 1

## 2015-03-26 MED ORDER — SODIUM CHLORIDE 0.9 % IV SOLN: INTRAVENOUS | Status: DC

## 2015-03-26 MED ORDER — SODIUM CHLORIDE 0.9 % IV SOLN: 250.0000 mL | INTRAVENOUS | Status: DC | PRN

## 2015-03-26 MED ORDER — SODIUM CHLORIDE 0.9% FLUSH: 3.0000 mL | INTRAVENOUS | Status: DC | PRN

## 2015-03-26 MED ORDER — ONDANSETRON HCL 4 MG/2ML IJ SOLN: 4.0000 mg | Freq: Three times a day (TID) | INTRAMUSCULAR | Status: DC | PRN

## 2015-03-26 MED ORDER — SODIUM CHLORIDE 0.9 % IV SOLN: Freq: Once | INTRAVENOUS | Status: DC

## 2015-03-26 MED ORDER — LORAZEPAM 2 MG/ML IJ SOLN
1.0000 mg | Freq: Four times a day (QID) | INTRAMUSCULAR | Status: DC | PRN
  Administered 2015-03-26 – 2015-03-27 (×4): 1 mg via INTRAVENOUS
  Filled 2015-03-26 (×4): qty 1

## 2015-03-26 MED ORDER — FOLIC ACID 1 MG PO TABS
1.0000 mg | ORAL_TABLET | Freq: Every day | ORAL | Status: DC
  Administered 2015-03-26 – 2015-03-28 (×3): 1 mg via ORAL
  Filled 2015-03-26 (×3): qty 1

## 2015-03-26 MED ORDER — SODIUM CHLORIDE 0.9% FLUSH
3.0000 mL | Freq: Two times a day (BID) | INTRAVENOUS | Status: DC
  Administered 2015-03-26 – 2015-03-27 (×2): 3 mL via INTRAVENOUS

## 2015-03-26 MED ORDER — VITAMIN B-1 100 MG PO TABS
100.0000 mg | ORAL_TABLET | Freq: Every day | ORAL | Status: DC
  Administered 2015-03-26 – 2015-03-28 (×3): 100 mg via ORAL
  Filled 2015-03-26 (×3): qty 1

## 2015-03-26 MED ORDER — ACETAMINOPHEN 650 MG RE SUPP: 650.0000 mg | Freq: Four times a day (QID) | RECTAL | Status: DC | PRN

## 2015-03-26 MED ORDER — SODIUM PERTECHNETATE TC 99M INJECTION
10.0000 | Freq: Once | INTRAVENOUS | Status: AC | PRN
  Administered 2015-03-26: 11 via INTRAVENOUS

## 2015-03-26 MED ORDER — LORAZEPAM 1 MG PO TABS
1.0000 mg | ORAL_TABLET | Freq: Four times a day (QID) | ORAL | Status: DC | PRN
  Administered 2015-03-27: 1 mg via ORAL
  Filled 2015-03-26: qty 1

## 2015-03-26 NOTE — Progress Notes (Signed)
Patient increased to Dsy. 3 diet for super and understands that she will be NPO after midnite.

## 2015-03-26 NOTE — Telephone Encounter (Signed)
REVIEWED-NO ADDITIONAL RECOMMENDATIONS. 

## 2015-03-26 NOTE — ED Notes (Signed)
Patient to nuclear med. Report called to Charletta Cousin on Dept 300, all questions answered

## 2015-03-26 NOTE — Consult Note (Signed)
Referring Provider: No ref. provider found Primary Care Physician:  No PCP Per Patient Primary Gastroenterologist:  Dr. Oneida Alar  Date of Admission: 03/26/15 Date of Consultation: 03/26/15  Reason for Consultation:  Severe anemia, rectal bleeding (ED consult)  HPI:  Meghan Carter is a 35 y.o. female with a past medical history of pelvic pain, menorrhagia, rectal bleeding, fecal occult blood +, endometrial polyp presented tot he ER complaining of 2 weeks of worsening rectal bleeding. She was last seen in our office 03/19/14 for rectal bleeding and rectal pain (chronic for several years) with noted blood clots and fresh to dark blood. At that time noted mild normocytic anemia (recent baseline Hgb 10-11.4), positive NSAID use and heavy menses. She was referred for colonoscopy which was completed on 03/23/14 and found redundant colon, moderate size internal hemorrhoids. Recommended flex sig with banding if symptoms persistent.   Today she states prior to her surgery for hemorrhoid banding endoscopically and D&C for fallopian tube polyp, she was rectal bleeding every other week. This continued, but some improvement, after surgeries and hemorrhoid banding. About 2 weeks ago she began having significantly worse rectal bleeding every morning and every evening associated with bowel bowel movement. At times she felt the need to defecate and would only pass blood and blood clots. Has been having abdominal pain in the lower abdomen which she attributed to her recent GYN exam with cervical manipulation. Pain is intermittent throughout the day and worse with bowel movement; described as sharp. Pain does not really improve with bowel movement. Denies N/V. Noted unusual very dark stools, last episode last week. Denies fever, chills. Has had decreased appetite and eating less, which she attributes to stress related to marital problems. Has subsequently had about 8 pound weight loss in the last 2 weeks. Denies chest pain.  Has had some noted tachycardia, dyspnea with exertion, weakness, fatigue, feeling very cold. Denies syncope. Denies any other upper or lower GI symptoms. Currently not on NSAIDs. Daily ETOH use, last CT Abd/pelvis with normal appearing liver.  Past Medical History  Diagnosis Date  . Hydrosalpinx   . HSV (herpes simplex virus) infection   . Vaginal Pap smear, abnormal   . Pelvic pain in female 01/27/2014  . Menorrhagia with regular cycle 01/27/2014  . Rectal bleeding 01/27/2014  . Fecal occult blood test positive 01/27/2014  . Endometrial polyp 02/11/2014  . History of abnormal cervical Pap smear 02/11/2014    Past Surgical History  Procedure Laterality Date  . Ectopic pregnancy surgery Left 2005  . Colonoscopy N/A 03/23/2014    SLF: 1. The examined terminal ileum appeared to be normal. 2. The left colon is redundant 3. Moderate sized internal hemorrhoids  . Hemorrhoid banding N/A 03/23/2014    Procedure: HEMORRHOID BANDING;  Surgeon: Danie Binder, MD;  [facility];  Service: Endoscopy;  Laterality: N/A;  . Hysteroscopy w/d&c N/A 06/16/2014    Procedure: DILATATION AND CURETTAGE /HYSTEROSCOPY;  Surgeon: Jonnie Kind, MD;  Location: [facility];  Service: Gynecology;  Laterality: N/A;  . Polypectomy N/A 06/16/2014    Procedure: POLYPECTOMY (endometrial);  Surgeon: Jonnie Kind, MD;  Location: [facility];  Service: Gynecology;  Laterality: N/A;  . Laparoscopic unilateral salpingectomy Right 06/16/2014    Procedure: LAPAROSCOPIC UNILATERAL SALPINGECTOMY;  Surgeon: Jonnie Kind, MD;  Location: [facility];  Service: Gynecology;  Laterality: Right;    Prior to Admission medications   Medication Sig Start Date End Date Taking? Authorizing Provider  ciprofloxacin (CIPRO) 500 MG tablet  Take 1 tablet (500 mg total) by mouth 2 (two) times daily. Patient not taking: Reported on 03/26/2015 07/03/14   Florian Buff, MD  HYDROcodone-acetaminophen (NORCO/VICODIN) 5-325 MG per tablet 1/2 to 1 EVERY  4-6 H PRN FOR PAIN Patient not taking: Reported on 03/26/2015 07/03/14   Florian Buff, MD  ibuprofen (ADVIL,MOTRIN) 800 MG tablet Take 1 tablet (800 mg total) by mouth every 8 (eight) hours as needed. Patient not taking: Reported on 06/23/2014 02/25/14   Estill Dooms, NP  ketorolac (TORADOL) 10 MG tablet Take 1 tablet (10 mg total) by mouth every 6 (six) hours as needed. Patient not taking: Reported on 07/03/2014 06/17/14   Jonnie Kind, MD  Linaclotide [facility]) 290 MCG CAPS capsule Take 1 capsule (290 mcg total) by mouth daily. Patient not taking: Reported on 06/08/2014 04/02/14   Mahala Menghini, PA-C  metroNIDAZOLE (FLAGYL) 500 MG tablet Take 1 tablet (500 mg total) by mouth 2 (two) times daily. Patient not taking: Reported on 03/26/2015 07/03/14   Florian Buff, MD  pramoxine-hydrocortisone (PROCTOCREAM-HC) 1-1 % rectal cream USE PR 4 TIMES A DAY FOR 10 DAYS. Patient not taking: Reported on 06/08/2014 03/23/14   Danie Binder, MD    Current Facility-Administered Medications  Medication Dose Route Frequency Provider Last Rate Last Dose  . 0.9 %  sodium chloride infusion   Intravenous Once Noemi Chapel, MD       Current Outpatient Prescriptions  Medication Sig Dispense Refill  . ciprofloxacin (CIPRO) 500 MG tablet Take 1 tablet (500 mg total) by mouth 2 (two) times daily. (Patient not taking: Reported on 03/26/2015) 20 tablet 0  . HYDROcodone-acetaminophen (NORCO/VICODIN) 5-325 MG per tablet 1/2 to 1 EVERY 4-6 H PRN FOR PAIN (Patient not taking: Reported on 03/26/2015) 20 tablet 0  . ibuprofen (ADVIL,MOTRIN) 800 MG tablet Take 1 tablet (800 mg total) by mouth every 8 (eight) hours as needed. (Patient not taking: Reported on 06/23/2014) 60 tablet 1  . ketorolac (TORADOL) 10 MG tablet Take 1 tablet (10 mg total) by mouth every 6 (six) hours as needed. (Patient not taking: Reported on 07/03/2014) 20 tablet 0  . Linaclotide (LINZESS) 290 MCG CAPS capsule Take 1 capsule (290 mcg total) by mouth daily.  (Patient not taking: Reported on 06/08/2014) 30 capsule 11  . metroNIDAZOLE (FLAGYL) 500 MG tablet Take 1 tablet (500 mg total) by mouth 2 (two) times daily. (Patient not taking: Reported on 03/26/2015) 20 tablet 0  . pramoxine-hydrocortisone (PROCTOCREAM-HC) 1-1 % rectal cream USE PR 4 TIMES A DAY FOR 10 DAYS. (Patient not taking: Reported on 06/08/2014) 30 g 0    Allergies as of 03/26/2015  . (No Known Allergies)    Family History  Problem Relation Age of Onset  . Diabetes Father   . Diabetes Paternal Grandmother   . Diabetes Paternal Grandfather   . Colon cancer Neg Hx   . Inflammatory bowel disease Neg Hx     Social History   Social History  . Marital Status: Legally Separated    Spouse Name: N/A  . Number of Children: N/A  . Years of Education: N/A   Occupational History  . Not on file.   Social History Main Topics  . Smoking status: Current Some Day Smoker -- 0.25 packs/day for 22 years    Types: Cigarettes  . Smokeless tobacco: Never Used  . Alcohol Use: Yes     Comment: daily at present  . Drug Use: No  . Sexual Activity:  Yes    Birth Control/ Protection: None   Other Topics Concern  . Not on file   Social History Narrative    Review of Systems: 10-point ROS negative except as per HPI.  Physical Exam: Vital signs in last 24 hours: Temp:  [98 F (36.7 C)] 98 F (36.7 C) (02/17 0927) Pulse Rate:  [100] 100 (02/17 0927) Resp:  [18] 18 (02/17 0927) BP: (134)/(74) 134/74 mmHg (02/17 0927) SpO2:  [100 %] 100 % (02/17 0927) Weight:  [190 lb (86.183 kg)] 190 lb (86.183 kg) (02/17 0927)   General:   Alert,  Well-developed, well-nourished, pleasant and cooperative in NAD Head:  Normocephalic and atraumatic. Eyes:  Sclera clear, no icterus. Conjunctiva pink. Ears:  Normal auditory acuity. Neck:  Supple; no masses or thyromegaly. Lungs:  Clear throughout to auscultation.   No wheezes, crackles, or rhonchi. No acute distress. Heart:  Regular rate and rhythm; no  murmurs, clicks, rubs,  or gallops. Abdomen:  Soft, nontender and nondistended. No masses, hepatosplenomegaly or hernias noted. Normal bowel sounds, without guarding, and without rebound.   Rectal:  Deferred.   Msk:  Symmetrical without gross deformities. Pulses:  Normal pulses noted. Extremities:  Without clubbing or edema. Neurologic:  Alert and  oriented x4;  grossly normal neurologically. Psych:  Alert and cooperative. Normal mood and affect.  Intake/Output from previous day:   Intake/Output this shift:    Lab Results:  Recent Labs  03/26/15 1003  WBC 5.0  HGB 6.4*  HCT 21.8*  PLT 332   BMET  Recent Labs  03/26/15 1003  NA 137  K 4.2  CL 110  CO2 21*  GLUCOSE 95  BUN 12  CREATININE 0.65  CALCIUM 8.6*   LFT  Recent Labs  03/26/15 1003  PROT 6.8  ALBUMIN 3.8  AST 17  ALT 12*  ALKPHOS 54  BILITOT 0.2*   PT/INR  Recent Labs  03/26/15 1003  LABPROT 13.1  INR 0.97   Hepatitis Panel No results for input(s): HEPBSAG, HCVAB, HEPAIGM, HEPBIGM in the last 72 hours. C-Diff No results for input(s): CDIFFTOX in the last 72 hours.  Studies/Results: No results found.  Impression: Patient with a chronic history of rectal pain and rectal bleeding, anemia. Presented with 2 week history of worsened rectal bleeding including fresh red blood, clots with associated intermittent abdominal pain worse with bowel movement. Became symptomatic with bleeding including DOE, tachycardia, weakness, fatigue. Hgb on ER presentation 6.4. Type and Cross done, decision to admit was made. Abdomen non-tender on exam. ER anoscopy with noted red blood. Last oral intake 10 pm last night. Last bowel movement this morning with again noted blood. Previous colonoscopy 1 year ago with noted internal and external hemorrhoids, no diverticula or other abnormalities. Differentials include meckel's diverticulum, significant hemorrhoid bleeding.  Plan: 1. Meckels nuclear medicine scan  now 2. Monitor H/H closely 3. Monitor for recurrent/ongoing bleeding. 4. Transfuse as necessary 5. If continued bleeding, possible flexible sigmoidoscopy tomorrow 6. Supportive measures   Walden Field, AGNP-C Adult & Gerontological Nurse Practitioner [facility]        03/26/2015, 1:14 PM

## 2015-03-26 NOTE — ED Notes (Signed)
Pt states that she has been having rectal bleeding for over 6 years.  Has become heavier over the past few weeks.  Husband recently left her and alcohol use has increased to several shots daily.

## 2015-03-26 NOTE — Telephone Encounter (Signed)
Pt called and said she is concerned for her life because she has been " pouring blood for 2 weeks". Then she said every morning and every night for 2 weeks she would pour blood in the comode. She said she feels so weak and feels like her legs will not hold her up. I told to to [facility] ED NOW. She said she would.

## 2015-03-26 NOTE — H&P (Signed)
History and Physical  Meghan Carter [id number] DOB: 1980-09-30 DOA: 03/26/2015  Referring physician: Noemi Chapel, MD. PCP: No PCP Per Patient   Chief Complaint: Rectal bleeding  HPI:  60 yof PMHx of hemorrhoids and menorrhagia presented with rectal bleeding. Hgb  6.4. GI was consulted and transfusion of 2U PRBCs was initiated in the ED. She will be admitted for rectal bleeding and ABLA.   Patient states she has had intermittent rectal bleeding for years. She had a colonoscopy and hemorrhoids banded last year by Dr. Oneida Alar. However over the last two weeks, she has bloody stools with every BM. She admits to associated fatigue, palpitations. Has not taken anything for her symptoms PTA. She denies any fever, nausea, vomiting, numbness, or weakness. She denies any NSAID use. Of note, the patient has a large hemorrhoid that frequently has to be pushed back in. She also reports straining with every bowel movement and problems with constipation.    In the emergency department Afebrile, VSS. Pertinent labs: CMP unremarkable, Hgb 6.4. MCV 72.7  Review of Systems:  Positive for rectal bleeding, fatigue, decreased appetite, palpitations, blurred vision Negative for fever, sore throat, rash, chest pain, SOB, dysuria, n/v.  Past Medical History  Diagnosis Date  . Hydrosalpinx   . HSV (herpes simplex virus) infection   . Vaginal Pap smear, abnormal   . Pelvic pain in female 01/27/2014  . Menorrhagia with regular cycle 01/27/2014  . Rectal bleeding 01/27/2014  . Fecal occult blood test positive 01/27/2014  . Endometrial polyp 02/11/2014  . History of abnormal cervical Pap smear 02/11/2014    Past Surgical History  Procedure Laterality Date  . Ectopic pregnancy surgery Left 2005  . Colonoscopy N/A 03/23/2014    SLF: 1. The examined terminal ileum appeared to be normal. 2. The left colon is redundant 3. Moderate sized internal hemorrhoids  . Hemorrhoid banding N/A 03/23/2014    Procedure:  HEMORRHOID BANDING;  Surgeon: Danie Binder, MD;  [facility];  Service: Endoscopy;  Laterality: N/A;  . Hysteroscopy w/d&c N/A 06/16/2014    Procedure: DILATATION AND CURETTAGE /HYSTEROSCOPY;  Surgeon: Jonnie Kind, MD;  Location: [facility];  Service: Gynecology;  Laterality: N/A;  . Polypectomy N/A 06/16/2014    Procedure: POLYPECTOMY (endometrial);  Surgeon: Jonnie Kind, MD;  Location: [facility];  Service: Gynecology;  Laterality: N/A;  . Laparoscopic unilateral salpingectomy Right 06/16/2014    Procedure: LAPAROSCOPIC UNILATERAL SALPINGECTOMY;  Surgeon: Jonnie Kind, MD;  Location: [facility];  Service: Gynecology;  Laterality: Right;    Social History:  reports that she has been smoking Cigarettes.  She has a 5.5 pack-year smoking history. She has never used smokeless tobacco. She reports that she drinks alcohol. She reports that she does not use illicit drugs. lives with their family Self-care  No Known Allergies  Family History  Problem Relation Age of Onset  . Diabetes Father   . Diabetes Paternal Grandmother   . Diabetes Paternal Grandfather   . Colon cancer Neg Hx   . Inflammatory bowel disease Neg Hx      Prior to Admission medications   Medication Sig Start Date End Date Taking? Authorizing Provider  ciprofloxacin (CIPRO) 500 MG tablet Take 1 tablet (500 mg total) by mouth 2 (two) times daily. Patient not taking: Reported on 03/26/2015 07/03/14   Florian Buff, MD  HYDROcodone-acetaminophen (NORCO/VICODIN) 5-325 MG per tablet 1/2 to 1 EVERY 4-6 H PRN FOR PAIN Patient not taking: Reported on 03/26/2015 07/03/14  Florian Buff, MD  ibuprofen (ADVIL,MOTRIN) 800 MG tablet Take 1 tablet (800 mg total) by mouth every 8 (eight) hours as needed. Patient not taking: Reported on 06/23/2014 02/25/14   Estill Dooms, NP  ketorolac (TORADOL) 10 MG tablet Take 1 tablet (10 mg total) by mouth every 6 (six) hours as needed. Patient not taking: Reported on 07/03/2014 06/17/14    Jonnie Kind, MD  Linaclotide [facility]) 290 MCG CAPS capsule Take 1 capsule (290 mcg total) by mouth daily. Patient not taking: Reported on 06/08/2014 04/02/14   Mahala Menghini, PA-C  metroNIDAZOLE (FLAGYL) 500 MG tablet Take 1 tablet (500 mg total) by mouth 2 (two) times daily. Patient not taking: Reported on 03/26/2015 07/03/14   Florian Buff, MD  pramoxine-hydrocortisone (PROCTOCREAM-HC) 1-1 % rectal cream USE PR 4 TIMES A DAY FOR 10 DAYS. Patient not taking: Reported on 06/08/2014 03/23/14   Danie Binder, MD   Physical Exam: Filed Vitals:   03/26/15 0927  BP: 134/74  Pulse: 100  Temp: 98 F (36.7 C)  TempSrc: Oral  Resp: 18  Height: 5\' 5"  (1.651 m)  Weight: 86.183 kg (190 lb)  SpO2: 100%    VSS, afebrile General:  Appears calm and comfortable Eyes: PERRL, normal lids, irises   ENT: grossly normal hearing, lips   Neck: appears normal Cardiovascular: RRR, no m/r/g. No LE edema. Respiratory: CTA bilaterally, no w/r/r. Normal respiratory effort. Abdomen: soft, ntnd Skin: no rash or induration noted Musculoskeletal: grossly normal tone BUE/BLE Psychiatric: grossly normal mood and affect, speech fluent and appropriate Neurologic: grossly non-focal.  Wt Readings from Last 3 Encounters:  03/26/15 86.183 kg (190 lb)  07/03/14 93.441 kg (206 lb)  06/23/14 96.843 kg (213 lb 8 oz)    Labs on Admission:  Basic Metabolic Panel:  Recent Labs Lab 03/26/15 1003  NA 137  K 4.2  CL 110  CO2 21*  GLUCOSE 95  BUN 12  CREATININE 0.65  CALCIUM 8.6*    Liver Function Tests:  Recent Labs Lab 03/26/15 1003  AST 17  ALT 12*  ALKPHOS 54  BILITOT 0.2*  PROT 6.8  ALBUMIN 3.8   CBC:  Recent Labs Lab 03/26/15 1003  WBC 5.0  NEUTROABS 2.3  HGB 6.4*  HCT 21.8*  MCV 72.7*  PLT 332     Principal Problem:   Rectal bleeding Active Problems:   Acute blood loss anemia   Assessment/Plan 1. Rectal bleeding. Differential includes hemorrhoids. 2. ABLA, symptomatic  secondary to rectal bleeding. Hgb 6.4 3. Alcohol abuse, 2-3 drinks per day. 4. PMH menorrhagia, hemorrhoids banding   Vital signs are stable, she appears stable for admission to medical bed.   Transfuse 2 units PRBC for GIB and symptomatic anemia. Follow up GI recommendations.   Monitor for alcohol withdrawal  Check serum pregnancy  Code Status: Full  DVT prophylaxis:SCDs Family Communication: No family at bedside. Disposition Plan/Anticipated LOS: Admit to med-surg, anticipate 2-3 day stay.  Time spent: 55 minutes  Murray Hodgkins, MD  [facility] Pager [phone] 03/26/2015, 12:47 PM   By signing my name below, I, Rosalie Doctor attest that this documentation has been prepared under the direction and in the presence of Murray Hodgkins, MD Electronically signed: Rosalie Doctor, Scribe. 03/26/2015  I personally performed the services described in this documentation. All medical record entries made by the scribe were at my direction. I have reviewed the chart and agree that the record reflects my personal performance and is accurate and complete.  Murray Hodgkins, MD

## 2015-03-26 NOTE — ED Provider Notes (Signed)
CSN: [id number]     Arrival date & time 03/26/15  [id number] History  By signing my name below, I, Meghan Carter, attest that this documentation has been prepared under the direction and in the presence of Noemi Chapel, MD. Electronically Signed: Altamease Carter, ED Scribe. 03/26/2015. 10:01 AM   Chief Complaint  Patient presents with  . Rectal Bleeding    The history is provided by the patient. No language interpreter was used.   Meghan Carter is a 35 y.o. female who presents to the Emergency Department complaining of rectal bleeding that worsened 2 weeks ago. She has had 2 grossly bloody stools per day for the last 2 weeks.  Pt states that she been bleeding intermittently for years and had a coloscopy and hemorrhoids banded last year by Dr. Oneida Alar. Here she has a picture showing brown stool with mucous and gross blood.  She has never required a blood transfusion.  In the past she has had problems with constipation but notes significant improvement with dietary modification. She does note that she has a large hemorrhoid that frequently has to be pushed back in and notes straining with every bowel movement. Associated symptoms include fatigue, chills, lower abdominal pain, and mild back pain. She is eating but her appetite has decreased. Pt denies nausea, vomiting, fever, blurred vision, numbness, weakness.   Past Medical History  Diagnosis Date  . Hydrosalpinx   . HSV (herpes simplex virus) infection   . Vaginal Pap smear, abnormal   . Pelvic pain in female 01/27/2014  . Menorrhagia with regular cycle 01/27/2014  . Rectal bleeding 01/27/2014  . Fecal occult blood test positive 01/27/2014  . Endometrial polyp 02/11/2014  . History of abnormal cervical Pap smear 02/11/2014   Past Surgical History  Procedure Laterality Date  . Ectopic pregnancy surgery Left 2005  . Colonoscopy N/A 03/23/2014    SLF: 1. The examined terminal ileum appeared to be normal. 2. The left colon is redundant 3. Moderate  sized internal hemorrhoids  . Hemorrhoid banding N/A 03/23/2014    Procedure: HEMORRHOID BANDING;  Surgeon: Danie Binder, MD;  [facility];  Service: Endoscopy;  Laterality: N/A;  . Hysteroscopy w/d&c N/A 06/16/2014    Procedure: DILATATION AND CURETTAGE /HYSTEROSCOPY;  Surgeon: Jonnie Kind, MD;  Location: [facility];  Service: Gynecology;  Laterality: N/A;  . Polypectomy N/A 06/16/2014    Procedure: POLYPECTOMY (endometrial);  Surgeon: Jonnie Kind, MD;  Location: [facility];  Service: Gynecology;  Laterality: N/A;  . Laparoscopic unilateral salpingectomy Right 06/16/2014    Procedure: LAPAROSCOPIC UNILATERAL SALPINGECTOMY;  Surgeon: Jonnie Kind, MD;  Location: [facility];  Service: Gynecology;  Laterality: Right;   Family History  Problem Relation Age of Onset  . Diabetes Father   . Diabetes Paternal Grandmother   . Diabetes Paternal Grandfather   . Colon cancer Neg Hx   . Inflammatory bowel disease Neg Hx    Social History  Substance Use Topics  . Smoking status: Current Some Day Smoker -- 0.25 packs/day for 22 years    Types: Cigarettes  . Smokeless tobacco: Never Used  . Alcohol Use: Yes     Comment: daily at present   OB History    Gravida Para Term Preterm AB TAB SAB Ectopic Multiple Living   1    1   1        Review of Systems  Constitutional: Positive for chills, appetite change and fatigue. Negative for fever.  Eyes: Negative for visual disturbance.  Gastrointestinal: Positive for abdominal pain, blood in stool, hematochezia and rectal pain. Negative for nausea and vomiting.  Musculoskeletal: Positive for back pain.  Neurological: Negative for weakness and numbness.    Allergies  Review of patient's allergies indicates no known allergies.  Home Medications   Prior to Admission medications   Medication Sig Start Date End Date Taking? Authorizing Provider  ciprofloxacin (CIPRO) 500 MG tablet Take 1 tablet (500 mg total) by mouth 2 (two) times  daily. Patient not taking: Reported on 03/26/2015 07/03/14   Florian Buff, MD  HYDROcodone-acetaminophen (NORCO/VICODIN) 5-325 MG per tablet 1/2 to 1 EVERY 4-6 H PRN FOR PAIN Patient not taking: Reported on 03/26/2015 07/03/14   Florian Buff, MD  ibuprofen (ADVIL,MOTRIN) 800 MG tablet Take 1 tablet (800 mg total) by mouth every 8 (eight) hours as needed. Patient not taking: Reported on 06/23/2014 02/25/14   Estill Dooms, NP  ketorolac (TORADOL) 10 MG tablet Take 1 tablet (10 mg total) by mouth every 6 (six) hours as needed. Patient not taking: Reported on 07/03/2014 06/17/14   Jonnie Kind, MD  Linaclotide [facility]) 290 MCG CAPS capsule Take 1 capsule (290 mcg total) by mouth daily. Patient not taking: Reported on 06/08/2014 04/02/14   Mahala Menghini, PA-C  metroNIDAZOLE (FLAGYL) 500 MG tablet Take 1 tablet (500 mg total) by mouth 2 (two) times daily. Patient not taking: Reported on 03/26/2015 07/03/14   Florian Buff, MD  pramoxine-hydrocortisone (PROCTOCREAM-HC) 1-1 % rectal cream USE PR 4 TIMES A DAY FOR 10 DAYS. Patient not taking: Reported on 06/08/2014 03/23/14   Danie Binder, MD   BP 134/74 mmHg  Pulse 100  Temp(Src) 98 F (36.7 C) (Oral)  Resp 18  Ht 5\' 5"  (1.651 m)  Wt 190 lb (86.183 kg)  BMI 31.62 kg/m2  SpO2 100%  LMP 03/19/2015 Physical Exam  Constitutional: She appears well-developed and well-nourished. No distress.  HENT:  Head: Normocephalic and atraumatic.  Mouth/Throat: Oropharynx is clear and moist. No oropharyngeal exudate.  Eyes: EOM are normal. Pupils are equal, round, and reactive to light. Right eye exhibits no discharge. Left eye exhibits no discharge. No scleral icterus.  Minimally pale conjunctiva   Neck: Normal range of motion. Neck supple. No JVD present. No thyromegaly present.  Cardiovascular: Normal rate, regular rhythm, normal heart sounds and intact distal pulses.  Exam reveals no gallop and no friction rub.   No murmur heard. Pulmonary/Chest: Effort  normal and breath sounds normal. No respiratory distress. She has no wheezes. She has no rales.  Abdominal: Soft. Bowel sounds are normal. She exhibits no distension and no mass. There is tenderness.  Suprapubic and right lower discomfort with palpation. No tympanitic sounds or peritoneal sounds.   Genitourinary:  Chaperon present for exam Normal external anal structures - no fissure, no hemorrhoids, see anoscope note below  Musculoskeletal: Normal range of motion. She exhibits no edema or tenderness.  Lymphadenopathy:    She has no cervical adenopathy.  Neurological: She is alert. Coordination normal.  Skin: Skin is warm and dry. No rash noted. No erythema.  Psychiatric: She has a normal mood and affect. Her behavior is normal.  Nursing note and vitals reviewed.   ED Course  Procedures (including critical care time) DIAGNOSTIC STUDIES: Oxygen Saturation is 100% on RA,  normal by my interpretation.    COORDINATION OF CARE: 9:54 AM Discussed treatment plan which includes lab work with pt at bedside and pt agreed to plan.  Labs Review  Labs Reviewed  CBC WITH DIFFERENTIAL/PLATELET - Abnormal; Notable for the following:    RBC 3.00 (*)    Hemoglobin 6.4 (*)    HCT 21.8 (*)    MCV 72.7 (*)    MCH 21.3 (*)    MCHC 29.4 (*)    RDW 16.1 (*)    All other components within normal limits  COMPREHENSIVE METABOLIC PANEL - Abnormal; Notable for the following:    CO2 21 (*)    Calcium 8.6 (*)    ALT 12 (*)    Total Bilirubin 0.2 (*)    All other components within normal limits  PROTIME-INR  APTT  TYPE AND SCREEN  PREPARE RBC (CROSSMATCH)    Imaging Review No results found. I have personally reviewed and evaluated these lab results as part of my medical decision-making.   MDM   Final diagnoses:  Rectal bleed  Severe anemia    Procedure Note:  Anoscopy  Risks benefits alternatives of the procedure given to the patient Verbal Consent obtained Patient placed in the lateral  decubitus position Anoscopy performed Findings  Scant blood in the rectum, internal hemorroids seen - no active bleeding. Patient tolerated procedure without any complaints  D/w Dr. Sarajane Jews who will admit Transfusion started in the ED CC provided for severe anemia from GI bleed that had Hgb < 7 with tachycardia  CRITICAL CARE Performed by: Johnna Acosta Total critical care time: 35 minutes Critical care time was exclusive of separately billable procedures and treating other patients. Critical care was necessary to treat or prevent imminent or life-threatening deterioration. Critical care was time spent personally by me on the following activities: development of treatment plan with patient and/or surrogate as well as nursing, discussions with consultants, evaluation of patient's response to treatment, examination of patient, obtaining history from patient or surrogate, ordering and performing treatments and interventions, ordering and review of laboratory studies, ordering and review of radiographic studies, pulse oximetry and re-evaluation of patient's condition.   I personally performed the services described in this documentation, which was scribed in my presence. The recorded information has been reviewed and is accurate.       Noemi Chapel, MD 03/26/15 [phone]

## 2015-03-26 NOTE — Progress Notes (Signed)
Patient had called to desk because IV was beeping. Nurse went to room and patient was irate, saying that it was uncalled for her call light to be unanswered.  Explained to patient that secretary was currently on 30 minute lunch break, and that if staff was not in vicinity of call light, call light would go unanswered until staff was near. Patient's current nurses and numbers were on the board.  Explained to patient that if she needed emergent help to call nurses direct line. Patient was belligerent, stating her sister was a Marine scientist with [employer], and she knew nurse policy and how they (nurses)  were to act towards patients.  Explained to patient that we are trying team nursing, and that nurses have a wider assignment, and that since she was on the back hall in room 338, it would be difficult for her nurses to see her call light if they were on the middle hall with patients.  Patient still irate, wanting a piece of paper to leave a form to management about her issues and complaints.  Told patient I would give her a piece of paper, then  She stated," No i have my own." Explained to patient again that nurses were with other patients and that it was not intentional that her call light was unanswered.  Explained situation to patient's nurse, nurse supervisor is on the floor and went in and talked to patient.

## 2015-03-26 NOTE — ED Notes (Signed)
MD at bedside. 

## 2015-03-27 ENCOUNTER — Encounter (HOSPITAL_COMMUNITY): Payer: Self-pay | Admitting: *Deleted

## 2015-03-27 ENCOUNTER — Encounter (HOSPITAL_COMMUNITY)
Admission: EM | Disposition: A | Discharge status: Home / Self Care | Payer: Self-pay | Source: Home / Self Care | Attending: Emergency Medicine

## 2015-03-27 DIAGNOSIS — K625 Hemorrhage of anus and rectum: Secondary | ICD-10-CM | POA: Diagnosis not present

## 2015-03-27 DIAGNOSIS — D62 Acute posthemorrhagic anemia: Secondary | ICD-10-CM | POA: Diagnosis not present

## 2015-03-27 HISTORY — PX: FLEXIBLE SIGMOIDOSCOPY: SHX5431

## 2015-03-27 HISTORY — PX: HEMORRHOID BANDING: SHX5850

## 2015-03-27 LAB — BASIC METABOLIC PANEL
Anion gap: 6 (ref 5–15)
BUN: 8 mg/dL (ref 6–20)
CALCIUM: 8.4 mg/dL — AB (ref 8.9–10.3)
CHLORIDE: 110 mmol/L (ref 101–111)
CO2: 20 mmol/L — AB (ref 22–32)
CREATININE: 0.56 mg/dL (ref 0.44–1.00)
GFR calc non Af Amer: >60 mL/min (ref >60)
GLUCOSE: 89 mg/dL (ref 65–99)
Potassium: 3.9 mmol/L (ref 3.5–5.1)
Sodium: 136 mmol/L (ref 135–145)

## 2015-03-27 LAB — CBC
HEMATOCRIT: 27.6 % — AB (ref 36.0–46.0)
HEMOGLOBIN: 8.9 g/dL — AB (ref 12.0–15.0)
MCH: 23.9 pg — ABNORMAL LOW (ref 26.0–34.0)
MCHC: 32.2 g/dL (ref 30.0–36.0)
MCV: 74.2 fL — AB (ref 78.0–100.0)
Platelets: 263 10*3/uL (ref 150–400)
RBC: 3.72 MIL/uL — AB (ref 3.87–5.11)
RDW: 17.1 % — ABNORMAL HIGH (ref 11.5–15.5)
WBC: 7.3 10*3/uL (ref 4.0–10.5)

## 2015-03-27 LAB — TYPE AND SCREEN
ABO/RH(D): A POS
ANTIBODY SCREEN: NEGATIVE
Unit division: 0
Unit division: 0

## 2015-03-27 LAB — PREGNANCY, URINE: Preg Test, Ur: NEGATIVE

## 2015-03-27 SURGERY — SIGMOIDOSCOPY, FLEXIBLE
Anesthesia: Moderate Sedation

## 2015-03-27 MED ORDER — CALCIUM POLYCARBOPHIL 625 MG PO TABS
1250.0000 mg | ORAL_TABLET | Freq: Two times a day (BID) | ORAL | Status: DC
  Administered 2015-03-27 – 2015-03-28 (×2): 1250 mg via ORAL
  Filled 2015-03-27 (×5): qty 2

## 2015-03-27 MED ORDER — POLYETHYLENE GLYCOL 3350 17 G PO PACK
17.0000 g | PACK | Freq: Every day | ORAL | Status: DC
  Administered 2015-03-28: 17 g via ORAL
  Filled 2015-03-27: qty 1

## 2015-03-27 MED ORDER — MIDAZOLAM HCL 5 MG/5ML IJ SOLN
INTRAMUSCULAR | Status: DC | PRN
  Administered 2015-03-27: 2 mg via INTRAVENOUS
  Administered 2015-03-27: 1 mg via INTRAVENOUS

## 2015-03-27 MED ORDER — LIDOCAINE VISCOUS 2 % MT SOLN
OROMUCOSAL | Status: AC
  Filled 2015-03-27: qty 15

## 2015-03-27 MED ORDER — HYDROCODONE-ACETAMINOPHEN 5-325 MG PO TABS
1.0000 | ORAL_TABLET | ORAL | Status: DC | PRN
  Administered 2015-03-27: 1 via ORAL
  Administered 2015-03-27 – 2015-03-28 (×3): 2 via ORAL
  Administered 2015-03-28: 1 via ORAL
  Filled 2015-03-27 (×2): qty 2
  Filled 2015-03-27 (×2): qty 1
  Filled 2015-03-27: qty 2

## 2015-03-27 MED ORDER — MIDAZOLAM HCL 5 MG/5ML IJ SOLN
INTRAMUSCULAR | Status: AC
  Filled 2015-03-27: qty 10

## 2015-03-27 MED ORDER — LIDOCAINE HCL 2 % EX GEL
1.0000 "application " | Freq: Once | CUTANEOUS | Status: AC
  Administered 2015-03-27: 1 via TOPICAL

## 2015-03-27 MED ORDER — LIDOCAINE HCL 2 % EX GEL
CUTANEOUS | Status: AC
  Filled 2015-03-27: qty 30

## 2015-03-27 MED ORDER — LINACLOTIDE 145 MCG PO CAPS
145.0000 ug | ORAL_CAPSULE | Freq: Every day | ORAL | Status: DC
  Administered 2015-03-28: 145 ug via ORAL
  Filled 2015-03-27: qty 1

## 2015-03-27 MED ORDER — MEPERIDINE HCL 100 MG/ML IJ SOLN
INTRAMUSCULAR | Status: DC | PRN
  Administered 2015-03-27: 25 mg via INTRAVENOUS

## 2015-03-27 MED ORDER — MEPERIDINE HCL 100 MG/ML IJ SOLN
INTRAMUSCULAR | Status: AC
  Filled 2015-03-27: qty 2

## 2015-03-27 MED ORDER — DOCUSATE SODIUM 100 MG PO CAPS
100.0000 mg | ORAL_CAPSULE | Freq: Two times a day (BID) | ORAL | Status: DC
  Administered 2015-03-27 – 2015-03-28 (×2): 100 mg via ORAL
  Filled 2015-03-27 (×2): qty 1

## 2015-03-27 NOTE — Progress Notes (Signed)
Initial Nutrition Assessment  DOCUMENTATION CODES:  Obesity unspecified  INTERVENTION:  Boost Breeze po TID, each supplement provides 250 kcal and 9 grams of protein  NUTRITION DIAGNOSIS:  Inadequate oral intake related to poor appetite, social / environmental circumstances as evidenced by per patient/family report.  GOAL:  Patient will meet greater than or equal to 90% of their needs  MONITOR:  PO intake, Diet advancement, Supplement acceptance, Labs, I & O's  REASON FOR ASSESSMENT:  Malnutrition Screening Tool    ASSESSMENT:  35 y/o female PMHx Hemorrhoids and menorrhagia presents with rectal bleeding.  Admitted for ABLA.   RD operating remotely. Per notes, she reports decreased appetite recently and increased alcohol consumption to several shots/day. She has had marital issues and is going through a split and noted this is the reason for the decreased Po intake and, because of this, has lost 8 lbs in 2 weeks.   Per EMR, pt does appear to have lost a considerable amount of wt since (16 lbs) her last measurement though- pt reported only losing 8 lbs these past 2 weeks.   There has not been any documented meal intake at this time. Will give clear liquid protein supplement.   NFPE: Unable to conduct  Labs reviewed: Anemic  Diet Order:  Diet NPO time specified  Skin:  Reviewed, no issues  Last BM:  PTA  Height:  Ht Readings from Last 1 Encounters:  03/26/15 5\' 5"  (1.651 m)   Weight:  Wt Readings from Last 1 Encounters:  03/26/15 190 lb (86.183 kg)   Wt Readings from Last 10 Encounters:  03/26/15 190 lb (86.183 kg)  07/03/14 206 lb (93.441 kg)  06/23/14 213 lb 8 oz (96.843 kg)  06/16/14 200 lb (90.719 kg)  06/10/14 206 lb (93.441 kg)  06/10/14 206 lb (93.441 kg)  06/01/14 210 lb (95.255 kg)  04/20/14 208 lb (94.348 kg)  03/19/14 218 lb 3.2 oz (98.975 kg)  03/09/14 214 lb (97.07 kg)    Ideal Body Weight:  56.82 kg  BMI:  Body mass index is 31.62  kg/(m^2).  Estimated Nutritional Needs:  Kcal:  1600-1800 (19-21 kcal/kg bw) Protein:  62-74 g Pro (1.1-1.3 g/kg ibw) Fluid:  1.6-1.8 liters fluid  EDUCATION NEEDS:  No education needs identified at this time  Burtis Junes RD, LDN Nutrition Pager: [phone] 03/27/2015 8:52 AM

## 2015-03-27 NOTE — Op Note (Signed)
[place], [id number]   FLEX SIGMOIDOSCOPY PROCEDURE REPORT  PATIENT: Meghan Carter, Meghan Carter  MR#: [id number] BIRTHDATE: [birth year] , 35  yrs. old GENDER: female ENDOSCOPIST: Danie Binder, MD REFERRED BY: PROCEDURE DATE:  2015/04/07 PROCEDURE:   Sigmoidoscopy, diagnostic and Hemorrhoidectomy via banding, clips or ligation INDICATIONS:iron deficiency anemia and hematochezia. MEDICATIONS: Demerol 25 mg IV and Versed 3 mg IV MD INITIATED SEDATION: 0939. PROCEDURE COMPLETE 1004.  DESCRIPTION OF PROCEDURE:    Physical exam was performed.  Informed consent was obtained from the patient after explaining the benefits, risks, and alternatives to procedure.  The patient was connected to monitor and placed in left lateral position. Continuous oxygen was provided by nasal cannula and IV medicine administered through an indwelling cannula.  After administration of sedation and rectal exam, the patients rectum was intubated and the [id number]  colonoscope was advanced under direct visualization to the Bowie.  The scope was removed slowly by carefully examining the color, texture, anatomy, and integrity mucosa on the way out.  The patient was recovered in endoscopy and discharged home in satisfactory condition. Estimated blood loss is zero unless otherwise noted in this procedure report.    COLON FINDINGS: The colon mucosa was otherwise normal and Large internal hemorrhoids were found. 3 BANDS APPLIED,   NO OLD OR FRSEH BLOOD IN LUMEN.   PREP QUALITY: The overall prep quality was adequate.  COMPLICATIONS: None  ENDOSCOPIC IMPRESSION: 1.   RECTAL BLEEDING DUE TO  Large internal hemorrhoids 2.   ANEMIA DUE TO RECTAL BLEEDING/MENSES/POOR IRON INTAKE.  RECOMMENDATIONS: 1.  May use ibuprofen 200 MG over-the-counter 2 every 6 hours as needed for mild rectal pain OR TYLENOL.   Take the ibuprofen with food and milk. 2  Use sitz bath 3 times  a day OR LIDOCAINE JELLY 2% AS NEEDED FOR RECTAL PAIN. 3.  Avoid straining to have bowel movements. 4.  Keep anal area dry and clean. 5.  Do not use a donut shaped pillow or sit on the toilet for long periods. 6.  Move your bowels when your body has the urge. 7.  IF NEEDED, Add Colace 100 mg twice daily FOR 7 DAYS to soften the stool.  HOLD FOR DIARRHEA. 8.  FOLLOW A HIGH IRON DIET FOR 2 WEEKS. 9.  CONTINUE NUTRI-BULLET MIX TWICE DAILY. 10. ADVANCE DIET. D/C TO HOME FEB 19 IF NO RECTAL BLEEDING.   _______________________________ eSignedDanie Binder, MD 04/07/2015 10:32 AM   CPT CODES: ICD CODES:  The ICD and CPT codes recommended by this software are interpretations from the data that the clinical staff has captured with the software.  The verification of the translation of this report to the ICD and CPT codes and modifiers is the sole responsibility of the health care institution and practicing physician where this report was generated.  [employer] will not be held responsible for the validity of the ICD and CPT codes included on this report.  AMA assumes no liability for data contained or not contained herein. CPT is a Designer, television/film set of the [employer].

## 2015-03-27 NOTE — Discharge Instructions (Signed)
HEMORRHOIDAL BANDING DISCHARGE INSTRUCTIONS  I PUT BANDS IN 3 AREAS ABOVE YOUR HEMORRHOIDS. Please CALL [phone] FOR FOR RECTAL BLEEDING, FEVER, PAIN, OR DIFFICULTY URINATING. YOU MAY SEE BLEEDING FOR 3 DAYS TO 2 WEEKS.    HOME CARE INSTRUCTIONS  FOLLOWING YOUR PROCEDURE IT IS COMMON TO HAVE MINOR RECTAL DISCOMFORT OR PAIN. 1.  YOU may use ibuprofen 200 MG over-the-counter 2 every 6 hours as needed for mild rectal pain OR TYLENOL.   Take the ibuprofen with food and milk.  2   Use sitz bath 3 times a day  OR LIDOCAINE JELLY AS NEEDED FOR RECTAL PAIN.  3. Avoid straining to have bowel movements. 4. Keep anal area dry and clean.  5. Do not use a donut shaped pillow or sit on the toilet for long periods. This increases blood pooling and pain.  6. Move your bowels when your body has the urge; this will require less straining and will decrease pain and pressure.  7. IF NEEDED, Add Colace 100 mg twice daily FOR 7 DAYS to soften the stool. HOLD FOR DIARRHEA. 8. FOLLOW A HIGH IRON DIET FOR 2 WEEKS. SEE INFO BELOW. 9. CONTINUE NUTRIBULLET MIX TWICE DAILY.      SIGMOIDOSCOPY Care After Read the instructions outlined below and refer to this sheet in the next week. These discharge instructions provide you with general information on caring for yourself after you leave the hospital. While your treatment has been planned according to the most current medical practices available, unavoidable complications occasionally occur. If you have any problems or questions after discharge, call DR. Emmalynn Pinkham, [phone].  ACTIVITY  You may resume your regular activity, but move at a slower pace for the next 24 hours.   Take frequent rest periods for the next 24 hours.   Walking will help get rid of the air and reduce the bloated feeling in your belly (abdomen).   No driving for 24 hours (because of the medicine (anesthesia) used during the test).   You may shower.   Do not sign any important legal  documents or operate any machinery for 24 hours (because of the anesthesia used during the test).    NUTRITION  Drink plenty of fluids.   You may resume your normal diet as instructed by your doctor.   Begin with a light meal and progress to your normal diet. Heavy or fried foods are harder to digest and may make you feel sick to your stomach (nauseated).   Avoid alcoholic beverages for 24 hours or as instructed.    MEDICATIONS  You may resume your normal medications.   WHAT YOU CAN EXPECT TODAY  Some feelings of bloating in the abdomen.   Passage of more gas than usual.   Spotting of blood in your stool or on the toilet paper  .  IF YOU HAD POLYPS REMOVED DURING THE COLONOSCOPY:  Eat a soft diet IF YOU HAVE NAUSEA, BLOATING, ABDOMINAL PAIN, OR VOMITING.    FINDING OUT THE RESULTS OF YOUR TEST Not all test results are available during your visit. DR. Oneida Alar WILL CALL YOU WITHIN 7 DAYS OF YOUR PROCEDUE WITH YOUR RESULTS. Do not assume everything is normal if you have not heard from DR. Georgia Baria IN ONE WEEK, CALL HER OFFICE AT [phone].  SEEK IMMEDIATE MEDICAL ATTENTION AND CALL THE OFFICE: [phone] IF:  You have more than a spotting of blood in your stool.   Your belly is swollen (abdominal distention).   You are nauseated or vomiting.  You have a temperature over 101F.   You have abdominal pain or discomfort that is severe or gets worse throughout the day.    HEMORRHOIDAL BANDING COMPLICATIONS:  COMMON: 1. MINOR PAIN  UNCOMMON: 1. ABSCESS 2. BAND FALLS OFF 3. PROLAPSE OF HEMORRHOIDS AND PAIN 4. ULCER BLEEDING  A. USUALLY SELF-LIMITED: MAY LAST 3-5 DAYS  B. MAY REQUIRE INTERVENTION: 1-2 WEEKS AFTER INTERACTIONS 5. NECROTIZING PELVIC SEPSIS  A. SYMPTOMS: FEVER, PAIN, DIFFICULTY URINATING   High Iron Diet  Purpose Iron is a mineral essential for life. Found in red blood cells, irons primary role is to carry oxygen from the lungs to the rest of  the body. Without oxygen, the bodys cells cannot function normally. If the bodys iron stores become too low, an iron-deficiency anemia can occur. This is characterized by weakness, lethargy, muscle fatigue, and shortness of breath. In severe cases, a persons skin may become pale due to a lack of red blood cells in the body. In adults, iron deficiency is most commonly caused by chronic blood loss, such as with heavy menstruation or intestinal bleeding from peptic ulcers, cancer, or hemorrhoids. In children, iron deficiency is usually the result of an inadequate iron intake.  Nutrition Facts The recommended dietary allowance (RDA) for iron in healthy adults is 10 milligrams per day for men and 15 milligrams per day for premenopausal women. Premenopausal womens needs are higher than mens needs because women lose iron during menstruation. It is generally easier for men to get enough iron than it is for women. Because they are usually bigger, men have higher calorie needs and will most likely eat enough food to meet their iron requirements. Women, on the other hand, tend to eat less. This makes it more difficult for them to meet their iron needs. It is, therefore, particularly important for premenopausal women to eat foods high in iron. Pregnant women will need as much as 30 milligrams of iron per day. The main reason is because the unborn baby needs iron for development. As a result, it will draw from the mothers iron stores. This can quickly deplete a woman of iron if she is not eating enough iron rich foods. The following table lists foods high in iron. In general, meat, fish, and poultry are excellent sources. Other sources of iron include beans, dried fruits, whole grains, fortified cereals, and enriched breads.   Special Considerations  1. Heme and nonhemd iron are two forms of iron in foods. Heme iron is found in meats, poultry, and fish. NonHeme iron is found in both plant and animal  foods. Heme iron is more easily absorbed by the body than nonheme iron. However, heme iron can also promote the absorption of non-heme iron. Therefore, eating beef and beans, for example, is good for providing adequate absorption of both types of iron. 2. Vitamin C also promotes iron absorption. This is true for both heme and nonheme iron. It is, therefore, beneficial to consume citrus fruits or juices, which are high in vitamin C, with foods that contain iron. For example, a meal might include a lean sirloin steak (heme iron source), baked potato (nonheme iron source , broccoli (nonheme iroj source), and an orange (vitamin C source) for a good iron intake. 3. Phytic and tannic aids are two food components that, when consumed in large amounts, prevent the abrorption of iron. Phytic acid is found in rye bread and other foods made from whole grains. Phytic acid is also found in nonherbal teas. Tannic acid is  found in commercial black and pekoe teas, coffee, cola drinks, chocolate, and red wines. 4. Iron SupplementsThere are many different kinds of iron supplements. However, iron supplemants should only be taken when there is a true deficiency of iron and only under medical supervision. General multivitamins often have iron and other minerals added to them in moderate amounts. If otherwise healthy, this amount of iron is probably not harmful. If iron is to be avoided, multivitamins containing iron should not be used.Please note that it is important to keep iron and multivita-in supplements safely away from a childs reach. If ingested, severe poisoning can occur.   Foods That Contain Iron  Food Serving Size (mg)  Bran flakes cereal 1 cup 24.0  Product 19 cereal 1 cup 24.0  Clams, steamed 3 oz 23.8  Total cereal 1 cup 18.0  Life cereal 1 cup 12.2  Raisin bran cereal 1 cup 9.3  Beef liver, braised 3 oz 5.8  Kix cereal 1 cup 5.4  Cheerios cereal 1 cup 3.6  Prune juice 1 cup 3.0  Potato, baked with skin  1 med 2.8  Sirloin steak, cooked 3 oz 2.8  Shrimp, cooked 3 oz 2.6  Navy beans, cooked 1/2 cup 2.3  Figs, dried 5 2.1  Lean ground beef, broiled 3 oz 2.1  Swiss chard, cooked 1/2 cup 2.0  Rice krispies cereal 1 cup 1.8  Kidney beans 1/2 cup 1.6  Oatmeal, cooked 1/2 cup 1.6  Spinach, raw 1 cup 1.5  Tuna, canned in water 3 oz 1.3  Green peas, conked 1/2 cup 1.2  Halibut, cooked 3 oz 0.9  Whole-wheat bread 1 slice 0.9  Apricot halves, dried 5 0.8  Raisins 1/4 cup 0.8  Broccoli, cooked 1/2 cup 0.6  Egg, boiled 1 large 0.6

## 2015-03-27 NOTE — Progress Notes (Signed)
PROGRESS NOTE  Meghan Carter [id number] DOB: 07/13/1980 DOA: 03/26/2015 PCP: No PCP Per Patient  Summary: 35 yof PMHx of hemorrhoids and menorrhagia presented with rectal bleeding. Hgb 6.4. GI was consulted and transfusion of 2U PRBCs was initiated in the ED. She will be admitted for rectal bleeding and ABLA.   Assessment/Plan: 1. Rectal bleeding secondary to hemorrhoids. GI performed flex sigmoidoscopy today that revealed large internal hemorrhoids. 3 bands were applied. Further recommendations per GI. She has not had any recurrent gross bleeding.  2. ABLA, symptomatic secondary to rectal bleeding/menses/poor iron intake. Hgb 6.4 on admission. Currently 8.9 s/p transfusion of 2U PRBCs.  3. Alcohol abuse, 2-3 drinks per day. No evidence of withdrawal.  4. PMH menorrhagia, hemorrhoids banding   Overall improved. Discussed with Dr. Theophilus Kinds further observation today; if no further bleeding, home tomorrow.  Anticipate discharge within 1-2 days.   Code Status: Full DVT prophylaxis:SCDs Family Communication: No family at bedside. Disposition Plan: Anticipate discharge in 1-2 days.  Murray Hodgkins, MD  [facility]  Pager [phone] If 7PM-7AM, please contact night-coverage at [email or web address], password [facility] 03/27/2015, 7:24 AM    Consultants:  GI  Procedures:  Meckels study- negative  Transfused 2U PRBCs  Flex sigmoidoscopy 2/18 by Dr. Oneida Alar.   Antibiotics:    HPI/Subjective: Has a sore throat. Does not have much of an appetite but denies any nausea or vomiting. Has pain in her rectum and mild blood upon wiping.  Objective: Filed Vitals:   03/26/15 2200 03/26/15 2239 03/27/15 0114 03/27/15 0552  BP: 116/65 113/91 112/75 94/53  Pulse: 89 87 67 73  Temp: 99 F (37.2 C) 98.7 F (37.1 C) 98.6 F (37 C) 98.7 F (37.1 C)  TempSrc: Oral Oral Oral Oral  Resp: 18 18 16 16   Height:      Weight:      SpO2: 100% 95%  100%    Intake/Output Summary  (Last 24 hours) at 03/27/15 0724 Last data filed at 03/26/15 2224  Gross per 24 hour  Intake    670 ml  Output      0 ml  Net    670 ml     Filed Weights   03/26/15 0927  Weight: 86.183 kg (190 lb)    Exam:     VSS, afebrile, not hypoxic General:  Appears calm and comfortable Cardiovascular: RRR, no m/r/g. No LE edema. Respiratory: CTA bilaterally, no w/r/r. Normal respiratory effort. Psychiatric: grossly normal mood and affect, speech fluent and appropriate  New data reviewed:  Hgb 8.9, remainder of CBC stable.   BMP unremarkable.  Flex sigmoidoscopy- noted  Pertinent data since admission:  Hgb 6.4  Meckels study negative  Scheduled Meds: . sodium chloride   Intravenous STAT  . folic acid  1 mg Oral Daily  . multivitamin with minerals  1 tablet Oral Daily  . sodium chloride flush  3 mL Intravenous Q12H  . thiamine  100 mg Oral Daily   Or  . thiamine  100 mg Intravenous Daily   Continuous Infusions: . sodium chloride      Principal Problem:   Rectal bleeding Active Problems:   Acute blood loss anemia   Time spent 15 minutes   By signing my name below, I, Rosalie Doctor attest that this documentation has been prepared under the direction and in the presence of Murray Hodgkins, MD Electronically signed: Rosalie Doctor, Scribe. 03/27/2015 2:17pm  I personally performed the services described in this documentation. All medical record entries made by the  scribe were at my direction. I have reviewed the chart and agree that the record reflects my personal performance and is accurate and complete. Murray Hodgkins, MD

## 2015-03-27 NOTE — Progress Notes (Signed)
Continues to exhibit periods of agitation and anxiousness at times Ativan IV given per CI [facility] protocol

## 2015-03-27 NOTE — H&P (Addendum)
  Primary Care Physician:  No PCP Per Patient Primary Gastroenterologist:  Dr. Oneida Alar  Pre-Procedure History & Physical: HPI:  Meghan Carter is a 35 y.o. female here for rectal bleeding. Has menses Qmo. Doesn't eat meat every day.   Past Medical History  Diagnosis Date  . Hydrosalpinx   . HSV (herpes simplex virus) infection   . Vaginal Pap smear, abnormal   . Pelvic pain in female 01/27/2014  . Menorrhagia with regular cycle 01/27/2014  . Rectal bleeding 01/27/2014  . Fecal occult blood test positive 01/27/2014  . Endometrial polyp 02/11/2014  . History of abnormal cervical Pap smear 02/11/2014    Past Surgical History  Procedure Laterality Date  . Ectopic pregnancy surgery Left 2005  . Colonoscopy N/A 03/23/2014    SLF: 1. The examined terminal ileum appeared to be normal. 2. The left colon is redundant 3. Moderate sized internal hemorrhoids  . Hemorrhoid banding N/A 03/23/2014    Procedure: HEMORRHOID BANDING;  Surgeon: Danie Binder, MD;  Location: [facility];  Service: Endoscopy;  Laterality: N/A;  . Hysteroscopy w/d&c N/A 06/16/2014    Procedure: DILATATION AND CURETTAGE /HYSTEROSCOPY;  Surgeon: Jonnie Kind, MD;  Location: [facility];  Service: Gynecology;  Laterality: N/A;  . Polypectomy N/A 06/16/2014    Procedure: POLYPECTOMY (endometrial);  Surgeon: Jonnie Kind, MD;  Location: [facility];  Service: Gynecology;  Laterality: N/A;  . Laparoscopic unilateral salpingectomy Right 06/16/2014    Procedure: LAPAROSCOPIC UNILATERAL SALPINGECTOMY;  Surgeon: Jonnie Kind, MD;  Location: [facility];  Service: Gynecology;  Laterality: Right;   Allergies as of 03/26/2015  . (No Known Allergies)   MEDS: NONE  Family History  Problem Relation Age of Onset  . Diabetes Father   . Diabetes Paternal Grandmother   . Diabetes Paternal Grandfather   . Colon cancer Neg Hx   . Inflammatory bowel disease Neg Hx     Social History   Social History  . Marital Status: Legally Separated   Spouse Name: N/A  . Number of Children: N/A  . Years of Education: N/A   Occupational History  . Not on file.   Social History Main Topics  . Smoking status: Current Some Day Smoker -- 0.25 packs/day for 22 years    Types: Cigarettes  . Smokeless tobacco: Never Used  . Alcohol Use: Yes     Comment: daily at present  . Drug Use: No  . Sexual Activity: Yes    Birth Control/ Protection: None   Other Topics Concern  . Not on file   Social History Narrative   Review of Systems: See HPI, otherwise negative ROS  Physical Exam: BP 94/53 mmHg  Pulse 73  Temp(Src) 98.5 F (36.9 C) (Oral)  Resp 16  Ht 5\' 5"  (1.651 m)  Wt 190 lb (86.183 kg)  BMI 31.62 kg/m2  SpO2 100%  LMP 03/19/2015 General:   Alert,  pleasant and cooperative in NAD Head:  Normocephalic and atraumatic. Neck:  Supple; Lungs:  Clear throughout to auscultation.    Heart:  Regular rate and rhythm. Abdomen:  Soft, nontender and nondistended. Normal bowel sounds, without guarding, and without rebound.   Neurologic:  Alert and  oriented x4;  grossly normal neurologically.  Impression/Plan:    RECTAL BLEEDING  PLAN:  1. TCS/? Hemorrhoid banding TODAY DISCUSSED PROCEDURE, BENEFITS, & RISKS: < 1% chance of medication reaction, bleeding, perforation, CAPSULE RETENTION, or  PELVIC VEIN SEPSIS.

## 2015-03-27 NOTE — Progress Notes (Signed)
Informed by Charge Nurse Lawernce Keas the patient was upset about her IV beeping. On arrival to the room patient states her pump was beeping for 4-12mins. She admitted calling the front desk to no avail. Both names of team nursing staff was written on the board, however patient had not attempted to call assigned nurses numbers. She  also states that none of her nurses had been to the room. Team 2 nurse Baird Cancer to being the one writing team nurses name on patients board and informing of roles. AC Tim Goins was called to the room to speak with patient. I apologized to patient for lapse in response time to get to her room. I assured her our goal was to give exceptional care. I encouraged patient to call nurses phones directly  for any needs she may have. Patient later admitted increased level of anxiety over current illness and marital spilt at this time. She was apologetic to nurse for her tone and outburst . I did offer Antivan as patient is currently on a CIWA protocol for ETOH  Abuse/ she was agreeable.

## 2015-03-28 DIAGNOSIS — K649 Unspecified hemorrhoids: Secondary | ICD-10-CM | POA: Diagnosis not present

## 2015-03-28 DIAGNOSIS — D509 Iron deficiency anemia, unspecified: Secondary | ICD-10-CM | POA: Diagnosis not present

## 2015-03-28 DIAGNOSIS — D62 Acute posthemorrhagic anemia: Secondary | ICD-10-CM | POA: Diagnosis not present

## 2015-03-28 DIAGNOSIS — K625 Hemorrhage of anus and rectum: Secondary | ICD-10-CM | POA: Diagnosis not present

## 2015-03-28 LAB — CBC
HEMATOCRIT: 25.9 % — AB (ref 36.0–46.0)
Hemoglobin: 8.4 g/dL — ABNORMAL LOW (ref 12.0–15.0)
MCH: 24.1 pg — AB (ref 26.0–34.0)
MCHC: 32.4 g/dL (ref 30.0–36.0)
MCV: 74.2 fL — AB (ref 78.0–100.0)
PLATELETS: 255 10*3/uL (ref 150–400)
RBC: 3.49 MIL/uL — ABNORMAL LOW (ref 3.87–5.11)
RDW: 17.5 % — AB (ref 11.5–15.5)
WBC: 8.4 10*3/uL (ref 4.0–10.5)

## 2015-03-28 MED ORDER — HYDROCORTISONE ACE-PRAMOXINE 1-1 % RE CREA: TOPICAL_CREAM | RECTAL | Status: DC

## 2015-03-28 NOTE — Progress Notes (Signed)
Discharge instructions read to patient.  Patient verbalized understanding of all instructions.  Pt family is on there way to transport pt to home.

## 2015-03-28 NOTE — Progress Notes (Signed)
Patient ID: Meghan Carter, female   DOB: 03/31/1980, 35 y.o.   MRN: [id number]   Assessment/Plan: ADMITTED WITH RECTAL BLEEDING AND ANEMIA. FLEX SIG/IH BANDING x3 FEB 18. ANEMIA MOST LIKELY DUE TO RECTAL BLEEDING FROM MENSES, HEMORRHOIDS, DAILY ETOH USE, AND POOR PO INTAKE. CLINICALLY IMPROVED.  PLAN: 1. HIGH IRON DIET. HANDOUT GIVEN. 2. NUTRI-BULLET BID 3. OPV IN  3 MOS WITH DR. Dyanara Cozza 4. PT SHOULD SEE A PCP AND A MENTAL HEALTH SPECIALIST 5. RETURN TO WORK FEB 21.   Subjective: Since I last evaluated the patient SHE HAS ONLY SEEN A PINKISH/RED TINGED DISCHARGE FROM HER RECTUM. NO BRBPR OR MELENA. RECTAL PAIN OR PINCHING. PASSING GAS. NO NAUSEA OR VOMITING OR ABDOMINAL PAIN.  Objective: Vital signs in last 24 hours: Filed Vitals:   03/27/15 2135 03/28/15 0522  BP: 132/72 115/71  Pulse: 84 60  Temp: 98.2 F (36.8 C) 98.1 F (36.7 C)  Resp: 18 18   General appearance: alert, cooperative and no distress Resp: clear to auscultation bilaterally Cardio: regular rate and rhythm GI: soft, non-tender; bowel sounds normal  Lab Results:    MAY 2016  Mar 26 2015  Mar 28 2015  HB  10.6    6.4  8.4 MCV  75.6    72.7  74.2  Studies/Results: No results found.  Medications: I have reviewed the patient's current medications.   LOS: 5 days   Barney Drain 07/17/2013, 2:23 PM

## 2015-03-28 NOTE — Progress Notes (Signed)
PROGRESS NOTE  Meghan Carter [id number] DOB: November 12, 1980 DOA: 03/26/2015 PCP: No PCP Per Patient  Summary: 68 yof PMHx of hemorrhoids presented with rectal bleeding. Hgb 6.4. GI was consulted and transfusion of 2U PRBCs was initiated in the ED. She will be admitted for rectal bleeding and ABLA.   Assessment/Plan: 1. Rectal bleeding secondary to hemorrhoids. Spontaneously resolved on admission. GI performed flex sigmoidoscopy that revealed large internal hemorrhoids. 3 bands were applied. Further recommendations per GI. She has not had any recurrent gross bleeding. She has a history of menorrhagia but reports her periods have been which she characterizes normal as of late. 2. ABLA, symptomatic secondary to rectal bleeding/menses/poor iron intake. Hgb 6.4 on admission. Improved to 8.9 with transfusion. 8.4 on discharge. 3. Alcohol abuse, 2-3 drinks per day. No evidence of withdrawal.  4. Obesity, nutrient following.   Follow-up with PCP within 1-2 weeks.   Follow-up with Dr. Oneida Alar in three months.   May use Ibuprofen 200mg  OTC or Tylenol.  Sitx bath x3 a day or Lidocaine jelly 2% as needed for rectal pain.   Avoid straining, keep anal area dry and clean, and avoid sitting donut shape pillow and toilet for long periods.   Colace if needed; hold for diarrhea.  Follow high iron diet.  Code Status: Full DVT prophylaxis:SCDs Family Communication: No family at bedside. Disposition Plan: Discharge home today   Murray Hodgkins, MD  [facility]  Pager [phone] If 7PM-7AM, please contact night-coverage at [email or web address], password [facility] 03/28/2015, 7:34 AM    Consultants:  GI  Procedures:  Meckels study- negative  Transfused 2U PRBCs  Flex sigmoidoscopy 2/18 by Dr. Oneida Alar.   Antibiotics:    HPI/Subjective: Feeling good. There has been no recurrent bleeding. Denies nausea, vomiting, or pain.   Objective: Filed Vitals:   03/27/15 1030 03/27/15 1035 03/27/15  2135 03/28/15 0522  BP: 115/75  132/72 115/71  Pulse: 54 49 84 60  Temp:   98.2 F (36.8 C) 98.1 F (36.7 C)  TempSrc:   Oral Oral  Resp: 18 18 18 18   Height:      Weight:      SpO2: 100% 100% 100% 100%    Intake/Output Summary (Last 24 hours) at 03/28/15 0734 Last data filed at 03/27/15 1200  Gross per 24 hour  Intake    120 ml  Output      0 ml  Net    120 ml     Filed Weights   03/26/15 0927  Weight: 86.183 kg (190 lb)    Exam:     VSS, afebrile, not hypoxic General:  Appears calm and comfortable Cardiovascular: RRR, no m/r/g. No LE edema.  Respiratory: CTA bilaterally, no w/r/r. Normal respiratory effort. Psychiatric: grossly normal mood and affect, speech fluent and appropriate   New data reviewed:  Hgb 8.4  Pertinent data since admission:  Hgb 6.4  Meckels study negative  Scheduled Meds: . docusate sodium  100 mg Oral BID  . folic acid  1 mg Oral Daily  . Linaclotide  145 mcg Oral Daily  . multivitamin with minerals  1 tablet Oral Daily  . polycarbophil  1,250 mg Oral BID  . polyethylene glycol  17 g Oral Daily  . sodium chloride flush  3 mL Intravenous Q12H  . thiamine  100 mg Oral Daily   Or  . thiamine  100 mg Intravenous Daily   Continuous Infusions:    Principal Problem:   Rectal bleeding Active Problems:   Acute blood  loss anemia    By signing my name below, I, Rennis Harding attest that this documentation has been prepared under the direction and in the presence of Murray Hodgkins, MD Electronically signed: Rennis Harding  03/28/2015 10:58am   I personally performed the services described in this documentation. All medical record entries made by the scribe were at my direction. I have reviewed the chart and agree that the record reflects my personal performance and is accurate and complete. Murray Hodgkins, MD

## 2015-03-28 NOTE — Progress Notes (Signed)
Pt continues waiting  Medicated for pain. Pt instructed about the effects of narcotic pain medications and told not to drive.

## 2015-03-28 NOTE — Discharge Summary (Signed)
Physician Discharge Summary  Meghan Carter [id number] DOB: 1980/05/13 DOA: 03/26/2015  PCP: No PCP Per Patient  Patient counseled to obtain primary care physician of choice.  Admit date: 03/26/2015 Discharge date: 03/28/2015  Recommendations for Outpatient Follow-up:   Follow-up with PCP within 1-2 weeks.   Follow-up with Dr. Oneida Alar in three months.   May use Ibuprofen 200mg  OTC or Tylenol.  Sitx bath x3 a day or Lidocaine jelly 2% as needed for rectal pain.   Avoid straining, keep anal area dry and clean, and avoid sitting donut shape pillow and toilet for long periods.   Colace if needed; hold for diarrhea.   Follow-up Information    Follow up with Barney Drain, MD In 3 months.   Specialty:  Gastroenterology   Why:  office will contact you with appointment   Contact information:   [address] [place] [address] [phone]      Discharge Diagnoses:  1. Rectal bleeding secondary to hemorrhoids.  2. ABLA. 3. Alcohol abuse. 4. Obesity.  Discharge Condition: Improved  Disposition: Discharge home   Diet recommendation: High iron   Filed Weights   03/26/15 0927  Weight: 86.183 kg (190 lb)    History of present illness:  56 yof PMHx of hemorrhoids and menorrhagia presented with rectal bleeding. Hgb 6.4. GI was consulted and transfusion of 2U PRBCs was initiated in the ED. She will be admitted for rectal bleeding and ABLA.   Hospital Course:  On admission for rectal bleeding and anemia, patient was transfused 2UPRBCs and evaluated by GI. GI performed flex sigmoidoscopy that revealed large internal hemorrhoids for which 3 bands were applied. Status post transfusion Hgb that was on admission noted to be 6.4 remained stable and noted to be 8.4 on discharge. GI recommendations as above. Hospitalization was uncomplicated.  Individual issues as below:  1. Rectal bleeding secondary to hemorrhoids. Spontaneously resolved on admission. GI  performed flex sigmoidoscopy that revealed large internal hemorrhoids. 3 bands were applied. Further recommendations per GI. She has not had any recurrent gross bleeding. She has a history of menorrhagia but reports her periods have been which she characterizes normal as of late. 2. ABLA, symptomatic secondary to rectal bleeding/menses/poor iron intake. Hgb 6.4 on admission. Improved to 8.9 with transfusion. 8.4 on discharge. 3. Alcohol abuse, 2-3 drinks per day. No evidence of withdrawal.  4. Obesity, nutrient following.   Consultants:  GI  Procedures:  Meckels study- negative  Transfused 2U PRBCs  Flex sigmoidoscopy 2/18 by Dr. Oneida Alar.  Discharge Instructions  Discharge Instructions    Activity as tolerated - No restrictions    Complete by:  As directed      Diet general    Complete by:  As directed      Discharge instructions    Complete by:  As directed   Call your physician or seek immediate medical attention for bleeding, pain, weakness, dizziness, falls or worsening of condition.          Discharge Medication List as of 03/28/2015 11:50 AM    CONTINUE these medications which have NOT CHANGED   Details  HYDROcodone-acetaminophen (NORCO/VICODIN) 5-325 MG per tablet 1/2 to 1 EVERY 4-6 H PRN FOR PAIN, Print    pramoxine-hydrocortisone (PROCTOCREAM-HC) 1-1 % rectal cream USE PR 4 TIMES A DAY FOR 10 DAYS., Normal      STOP taking these medications     ciprofloxacin (CIPRO) 500 MG tablet      ibuprofen (ADVIL,MOTRIN) 800 MG tablet  ketorolac (TORADOL) 10 MG tablet      Linaclotide (LINZESS) 290 MCG CAPS capsule      metroNIDAZOLE (FLAGYL) 500 MG tablet        No Known Allergies  The results of significant diagnostics from this hospitalization (including imaging, microbiology, ancillary and laboratory) are listed below for reference.    Significant Diagnostic Studies: Nm Bowel Img Meckels  03/26/2015  CLINICAL DATA:  Abdominal pain. Bloody stool. Six  year history of abdominal pain. EXAM: NUCLEAR MEDICINE MECKELS SCAN TECHNIQUE: Sequential abdominal images were obtained following intravenous injection of radiopharmaceutical. RADIOPHARMACEUTICALS:  11 millicuries technetium pertechnetate. COMPARISON:  CT abdomen 05/15/2011 FINDINGS: No accumulation of radiotracer within the abdomen or pelvis to localize a ectopic gastric mucosa. Physiologic activity is noted in the stomach and GU tract. Blood pool activity noted. IMPRESSION: No evidence of ectopic gastric mucosa (Meckel's diverticulum). Electronically Signed   By: Suzy Bouchard M.D.   On: 03/26/2015 16:29      Labs: Basic Metabolic Panel:  Recent Labs Lab 03/26/15 1003 03/27/15 0425  NA 137 136  K 4.2 3.9  CL 110 110  CO2 21* 20*  GLUCOSE 95 89  BUN 12 8  CREATININE 0.65 0.56  CALCIUM 8.6* 8.4*   Liver Function Tests:  Recent Labs Lab 03/26/15 1003  AST 17  ALT 12*  ALKPHOS 54  BILITOT 0.2*  PROT 6.8  ALBUMIN 3.8   CBC:  Recent Labs Lab 03/26/15 1003 03/27/15 0425 03/28/15 0729  WBC 5.0 7.3 8.4  NEUTROABS 2.3  --   --   HGB 6.4* 8.9* 8.4*  HCT 21.8* 27.6* 25.9*  MCV 72.7* 74.2* 74.2*  PLT 332 263 255    Principal Problem:   Rectal bleeding Active Problems:   Acute blood loss anemia   Time coordinating discharge: 35 minutes   Signed:  Murray Hodgkins, MD [facility] 03/28/2015, 4:14 PM    By signing my name below, I, Rennis Harding attest that this documentation has been prepared under the direction and in the presence of Murray Hodgkins, MD Electronically signed: Rennis Harding  03/28/2015 10:58  I personally performed the services described in this documentation. All medical record entries made by the scribe were at my direction. I have reviewed the chart and agree that the record reflects my personal performance and is accurate and complete. Murray Hodgkins, MD

## 2015-03-28 NOTE — Telephone Encounter (Signed)
PT NEED OPV IN 3 MOS E30 RECTAL BLEEDING.ANEMIA. MAY NEED EGD.

## 2015-03-29 ENCOUNTER — Encounter: Payer: Self-pay | Admitting: Gastroenterology

## 2015-03-29 ENCOUNTER — Telehealth: Payer: Self-pay | Admitting: General Practice

## 2015-03-29 NOTE — Telephone Encounter (Signed)
Please tell the patient that we reviewed her discharge paperwork and it indicates she should CONTINUE the Hydrocodone (a medication she came to the hospital already on) not that a new prescription would be provided. She will need to contact whoever normally writes this prescription for her. Most recently (per our system) this appears to have been filled by Dru Eure 07/03/14 and Tye Savoy (NP) on 03/27/15.  The rectal cream is the cheapest version of that medicine (suppositories are typically much more expensive). There appears to be a question about deductible and that would have to be addressed with her insurance company.

## 2015-03-29 NOTE — Telephone Encounter (Signed)
Patient made aware of Eric's recommendations and she voiced understanding

## 2015-03-29 NOTE — Telephone Encounter (Signed)
I made the patient aware that if her pain is that severe she may also go to the ER.  The patient was very thankful and polite as we disconnected the call.

## 2015-03-29 NOTE — Telephone Encounter (Signed)
REVIEWED-NO ADDITIONAL RECOMMENDATIONS. 

## 2015-03-29 NOTE — Telephone Encounter (Signed)
APPT MADE AND LETTER SENT  °

## 2015-03-29 NOTE — Telephone Encounter (Signed)
Patient called in stating she needs something for her "severe rectal pain".  She stated on her discharge instructions that Hydrocodone should have been sent in on yesterday for pain.  Also, she was told by [facility] that her rectal cream was not covered by her insurance and she can't afford the full amount for the Rx.  I spoke with Josh at [facility] and he thinks the patient may have a deductible and she needs to speak with her insurance company in regards to that deductible, however according to the patient the insurance company is closed for President's Day.  Routing to the refill box

## 2015-03-30 NOTE — Telephone Encounter (Signed)
Patient called in stating her insurance emailed her a coupon to receive a coupon for her cream and she will be able to afford it.  Patient stated that she would try to take the cream and get back to [place] on how it's working for her

## 2015-03-31 NOTE — Telephone Encounter (Signed)
Pt called and said the cream is not working for her. She had a rough night and she had to miss school yesterday and today. She needs a note for those two days. She is requesting a prescription for Hydrocodone and said that Dr. Oneida Alar has given it to her before. She said she is not able to work or go to school as much rectal pain as she has been having.  I spoke to Dr. Oneida Alar and she said that she will give her a work note for [employer] and Wed, but she will not give her a prescription for the Hydrocodone.  I called and told the pt and she is asking to speak to Dr. Oneida Alar. I told her Dr. Oneida Alar is with pt's and she cannot take calls. She wants Dr. Oneida Alar to tell her what is she supposed to do.

## 2015-04-01 ENCOUNTER — Ambulatory Visit (HOSPITAL_COMMUNITY)
Admission: RE | Admit: 2015-04-01 | Discharge: 2015-04-01 | Disposition: A | Discharge status: Home / Self Care | Payer: PRIVATE HEALTH INSURANCE | Source: Ambulatory Visit | Attending: Gastroenterology | Admitting: Gastroenterology

## 2015-04-01 ENCOUNTER — Encounter (HOSPITAL_COMMUNITY): Payer: Self-pay | Admitting: Emergency Medicine

## 2015-04-01 ENCOUNTER — Encounter (HOSPITAL_COMMUNITY)
Admission: RE | Disposition: A | Discharge status: Home / Self Care | Payer: Self-pay | Source: Ambulatory Visit | Attending: Gastroenterology

## 2015-04-01 ENCOUNTER — Other Ambulatory Visit: Payer: Self-pay

## 2015-04-01 ENCOUNTER — Encounter (HOSPITAL_COMMUNITY): Payer: Self-pay | Admitting: *Deleted

## 2015-04-01 ENCOUNTER — Emergency Department (HOSPITAL_COMMUNITY)
Admission: EM | Admit: 2015-04-01 | Discharge: 2015-04-01 | Disposition: A | Discharge status: Home / Self Care | Payer: PRIVATE HEALTH INSURANCE | Source: Home / Self Care | Attending: Emergency Medicine | Admitting: Emergency Medicine

## 2015-04-01 DIAGNOSIS — M791 Myalgia: Secondary | ICD-10-CM

## 2015-04-01 DIAGNOSIS — M79604 Pain in right leg: Secondary | ICD-10-CM

## 2015-04-01 DIAGNOSIS — Z8619 Personal history of other infectious and parasitic diseases: Secondary | ICD-10-CM

## 2015-04-01 DIAGNOSIS — Z8742 Personal history of other diseases of the female genital tract: Secondary | ICD-10-CM | POA: Insufficient documentation

## 2015-04-01 DIAGNOSIS — F17210 Nicotine dependence, cigarettes, uncomplicated: Secondary | ICD-10-CM | POA: Insufficient documentation

## 2015-04-01 DIAGNOSIS — K626 Ulcer of anus and rectum: Secondary | ICD-10-CM | POA: Insufficient documentation

## 2015-04-01 DIAGNOSIS — K6289 Other specified diseases of anus and rectum: Secondary | ICD-10-CM | POA: Diagnosis not present

## 2015-04-01 DIAGNOSIS — G8918 Other acute postprocedural pain: Secondary | ICD-10-CM | POA: Insufficient documentation

## 2015-04-01 DIAGNOSIS — K625 Hemorrhage of anus and rectum: Secondary | ICD-10-CM | POA: Diagnosis not present

## 2015-04-01 HISTORY — PX: FLEXIBLE SIGMOIDOSCOPY: SHX5431

## 2015-04-01 LAB — POC OCCULT BLOOD, ED: Fecal Occult Bld: NEGATIVE

## 2015-04-01 SURGERY — SIGMOIDOSCOPY, FLEXIBLE

## 2015-04-01 MED ORDER — FENTANYL CITRATE (PF) 100 MCG/2ML IJ SOLN: 25.0000 ug | Freq: Once | INTRAMUSCULAR | Status: DC

## 2015-04-01 MED ORDER — IBUPROFEN 800 MG PO TABS: 800.0000 mg | ORAL_TABLET | Freq: Three times a day (TID) | ORAL | Status: DC | PRN

## 2015-04-01 MED ORDER — HYDROCODONE-ACETAMINOPHEN 5-325 MG PO TABS: ORAL_TABLET | ORAL | Status: DC

## 2015-04-01 MED ORDER — LIDOCAINE HCL 2 % EX GEL: 1.0000 "application " | Freq: Once | CUTANEOUS | Status: DC

## 2015-04-01 MED ORDER — FENTANYL CITRATE (PF) 100 MCG/2ML IJ SOLN
25.0000 ug | INTRAMUSCULAR | Status: DC | PRN
  Administered 2015-04-01: 25 ug via INTRAVENOUS

## 2015-04-01 MED ORDER — LIDOCAINE HCL 2 % EX GEL
CUTANEOUS | Status: AC
  Administered 2015-04-01: 1
  Filled 2015-04-01: qty 30

## 2015-04-01 MED ORDER — SODIUM CHLORIDE 0.9 % IV SOLN
INTRAVENOUS | Status: DC
  Administered 2015-04-01: 20 mL/h via INTRAVENOUS

## 2015-04-01 MED ORDER — FENTANYL CITRATE (PF) 100 MCG/2ML IJ SOLN
INTRAMUSCULAR | Status: DC
Start: 2015-04-01 — End: 2015-04-01
  Filled 2015-04-01: qty 2

## 2015-04-01 MED ORDER — LIDOCAINE HCL 2 % EX GEL
CUTANEOUS | Status: AC
  Filled 2015-04-01: qty 30

## 2015-04-01 MED ORDER — MIDAZOLAM HCL 5 MG/5ML IJ SOLN
INTRAMUSCULAR | Status: DC | PRN
  Administered 2015-04-01 (×2): 1 mg via INTRAVENOUS

## 2015-04-01 MED ORDER — MIDAZOLAM HCL 5 MG/5ML IJ SOLN
INTRAMUSCULAR | Status: AC
  Filled 2015-04-01: qty 10

## 2015-04-01 MED ORDER — FENTANYL CITRATE (PF) 100 MCG/2ML IJ SOLN
INTRAMUSCULAR | Status: DC | PRN
  Administered 2015-04-01: 25 ug via INTRAVENOUS

## 2015-04-01 NOTE — Telephone Encounter (Signed)
SCHEDULE PT FOR FLEX SIG W/O SEDATION TODAY AT 230PM.

## 2015-04-01 NOTE — Interval H&P Note (Signed)
History and Physical Interval Note:  04/01/2015 1:47 PM  Meghan Carter  has presented today for surgery, with the diagnosis of rectal pain  The various methods of treatment have been discussed with the patient and family. After consideration of risks, benefits and other options for treatment, the patient has consented to  Procedure(s) with comments: FLEXIBLE SIGMOIDOSCOPY (N/A) - 1430 pt will do procedure without anesthesia per [facility] as a surgical intervention .  The patient's history has been reviewed, patient examined, no change in status, stable for surgery.  I have reviewed the patient's chart and labs.  Questions were answered to the patient's satisfaction.     [employer]

## 2015-04-01 NOTE — Discharge Instructions (Signed)
YOUR BANDS HAVE FALLEN OFF. You have AN ULCER THAT FORMED AT THE BANDING SITE. IT IS CAUSING PAIN.  THE PAIN SHOULD IMPROVE OVER THE NEXT 7 DAYS. CALL IN 7 DAYS IF YOUR PAIN IS NOT BETTER.   SITZ BATHS THREE TIMES A DAY FOR 4 DAYS.  USE COLACE 100 MG THREE TIMES A DAY TO SOFTEN STOOL FOR 7 DAYS. HOLD FOR DIARRHEA.  USE IBUPROFEN 800 MG THREE TIMES A DAY FOR 10 DAYS TO REDUCE RECTAL PAIN.  Use PROCTOCREAM to HELP relieve rectal pain.  USE VICODIN 5/325 1-2 EVERY 6 HOURS AS NEEDEDFOR PAIN RELIEF .  RETURN TO WORK AND SCHOOL FEB 27.      ENDOSCOPY Care After Read the instructions outlined below and refer to this sheet in the next week. These discharge instructions provide you with general information on caring for yourself after you leave the hospital. While your treatment has been planned according to the most current medical practices available, unavoidable complications occasionally occur. If you have any problems or questions after discharge, call DR. Kiarra Kidd, [phone].  ACTIVITY  You may resume your regular activity, but move at a slower pace for the next 24 hours.   Take frequent rest periods for the next 24 hours.   Walking will help get rid of the air and reduce the bloated feeling in your belly (abdomen).   No driving for 24 hours (because of the medicine (anesthesia) used during the test).   You may shower.   Do not sign any important legal documents or operate any machinery for 24 hours (because of the anesthesia used during the test).    NUTRITION  Drink plenty of fluids.   You may resume your normal diet as instructed by your doctor.   Begin with a light meal and progress to your normal diet. Heavy or fried foods are harder to digest and may make you feel sick to your stomach (nauseated).   Avoid alcoholic beverages for 24 hours or as instructed.    MEDICATIONS  You may resume your normal medications.   WHAT YOU CAN EXPECT TODAY  Some feelings of  bloating in the abdomen.   Passage of more gas than usual.   Spotting of blood in your stool or on the toilet paper  .  IF YOU HADBIOPSIES TAKEN  DURING THE SIGMOIDOSCOPY/UPPER ENDOSCOPY:  Eat a soft diet IF YOU HAVE NAUSEA, BLOATING, ABDOMINAL PAIN, OR VOMITING.    FINDING OUT THE RESULTS OF YOUR TEST Not all test results are available during your visit. DR. Oneida Alar WILL CALL YOU WITHIN 14 DAYS OF YOUR PROCEDUE WITH YOUR RESULTS. Do not assume everything is normal if you have not heard from DR. Cebert Dettmann, CALL HER OFFICE AT [phone].  SEEK IMMEDIATE MEDICAL ATTENTION AND CALL THE OFFICE: [phone] IF:  You have more than a spotting of blood in your stool.   Your belly is swollen (abdominal distention).   You are nauseated or vomiting.   You have a temperature over 101F.   You have abdominal pain or discomfort that is severe or gets worse throughout the day.

## 2015-04-01 NOTE — Op Note (Signed)
[place], [id number]   FLEX SIGMOIDOSCOPY PROCEDURE REPORT  PATIENT: Meghan Carter, Meghan Carter  MR#: [id number] BIRTHDATE: Aug 12, 1980 , 35  yrs. old GENDER: female ENDOSCOPIST: Danie Binder, MD REFERRED BY: PROCEDURE DATE:  04/30/2015 PROCEDURE:   Sigmoidoscopy, diagnostic INDICATIONS:RECTAL PAIN. MEDICATIONS: Versed 2 mg IV  MD INITIATED SEDATION: 1501. PROCEDURE COMPLETE: 1505  DESCRIPTION OF PROCEDURE:    Physical exam was performed.  Informed consent was obtained from the patient after explaining the benefits, risks, and alternatives to procedure.  The patient was connected to monitor and placed in left lateral position. Continuous oxygen was provided by nasal cannula and IV medicine administered through an indwelling cannula.  After administration of sedation and rectal exam, the patients rectum was intubated and the [id number]  colonoscope was advanced under direct visualization to the [place]. UNABLE TO ADVANCE SCOPE DUE TO PT DISCOMFORT. The scope was removed slowly by carefully examining the color, texture, anatomy, and integrity mucosa on the way out. The patient was recovered in endoscopy and discharged home in satisfactory condition. Estimated blood loss is zero unless otherwise noted in this procedure report.    FINDINGS: ONE ULCER NEAR DENTATE LINE.  NO VISBLE VESSEL.  SMALL AMOUNT OF FRESH BLOOD IN RECTUM and PROXIMAL RECTUM NORMAL.  PREP QUALITY: The overall prep quality was adequate.  COMPLICATIONS: None  ENDOSCOPIC IMPRESSION: 1.   ONE ULCER NEAR DENTATE LINE.  NO VISBLE VESSEL.  SMALL AMOUNT OF FRESH BLOOD IN RECTUM 2.   PROXIMAL RECTUM NORMAL  RECOMMENDATIONS: SITZ BATHS THREE TIMES A DAY FOR 4 DAYS. USE COLACE 100 MG THREE TIMES A DAY TO SOFTEN STOOL FOR 7 DAYS. HOLD FOR DIARRHEA. USE IBUPROFEN 800 MG THREE TIMES A DAY FOR 10 DAYS TO REDUCE RECTAL PAIN. Use PROCTOCREAM to HELP relieve rectal  pain. USE VICODIN 5/325 1-2 EVERY 6 HOURS AS NEEDEDFOR PAIN RELIEF . RETURN TO WORK AND SCHOOL FEB 27.      _______________________________ eSigned:  Danie Binder, MD April 30, 2015 3:21 PM   CPT CODES: ICD CODES:  The ICD and CPT codes recommended by this software are interpretations from the data that the clinical staff has captured with the software.  The verification of the translation of this report to the ICD and CPT codes and modifiers is the sole responsibility of the health care institution and practicing physician where this report was generated.  [employer] will not be held responsible for the validity of the ICD and CPT codes included on this report.  AMA assumes no liability for data contained or not contained herein. CPT is a Designer, television/film set of the [employer].

## 2015-04-01 NOTE — Telephone Encounter (Signed)
Orders entered

## 2015-04-01 NOTE — ED Notes (Signed)
Patient advised and given direction to short stay outpatient entrance of hospital. Patient to go directly there for consult with Dr Oneida Alar. Patient discharged from ED in no distress.

## 2015-04-01 NOTE — Progress Notes (Signed)
Please excuse Meghan Carter from work and school from Thursday 04/01/2015 until Monday 04/05/2015.

## 2015-04-01 NOTE — Discharge Instructions (Signed)
Go directly to the endoscopy suite at [facility] to meet Dr. Oneida Alar at 1:30 PM today. Do not eat or drink anything prior to seeing her.

## 2015-04-01 NOTE — Telephone Encounter (Signed)
Note has been printed and put at front for pt to pick up.  Please note one was printed incorrectly,it had a visit date on it.  The correct one does not have a visit date on it.

## 2015-04-01 NOTE — H&P (View-Only) (Signed)
  Primary Care Physician:  No PCP Per Patient Primary Gastroenterologist:  Dr. Oneida Alar  Pre-Procedure History & Physical: HPI:  Meghan Carter is a 35 y.o. female here for rectal bleeding. Has menses Qmo. Doesn't eat meat every day.   Past Medical History  Diagnosis Date  . Hydrosalpinx   . HSV (herpes simplex virus) infection   . Vaginal Pap smear, abnormal   . Pelvic pain in female 01/27/2014  . Menorrhagia with regular cycle 01/27/2014  . Rectal bleeding 01/27/2014  . Fecal occult blood test positive 01/27/2014  . Endometrial polyp 02/11/2014  . History of abnormal cervical Pap smear 02/11/2014    Past Surgical History  Procedure Laterality Date  . Ectopic pregnancy surgery Left 2005  . Colonoscopy N/A 03/23/2014    [facility]: 1. The examined terminal ileum appeared to be normal. 2. The left colon is redundant 3. Moderate sized internal hemorrhoids  . Hemorrhoid banding N/A 03/23/2014    Procedure: HEMORRHOID BANDING;  Surgeon: Danie Binder, MD;  Location: [facility] ENDO SUITE;  Service: Endoscopy;  Laterality: N/A;  . Hysteroscopy w/d&c N/A 06/16/2014    Procedure: DILATATION AND CURETTAGE /HYSTEROSCOPY;  Surgeon: Jonnie Kind, MD;  Location: [facility] ORS;  Service: Gynecology;  Laterality: N/A;  . Polypectomy N/A 06/16/2014    Procedure: POLYPECTOMY (endometrial);  Surgeon: Jonnie Kind, MD;  Location: [facility] ORS;  Service: Gynecology;  Laterality: N/A;  . Laparoscopic unilateral salpingectomy Right 06/16/2014    Procedure: LAPAROSCOPIC UNILATERAL SALPINGECTOMY;  Surgeon: Jonnie Kind, MD;  Location: [facility] ORS;  Service: Gynecology;  Laterality: Right;   Allergies as of 03/26/2015  . (No Known Allergies)   MEDS: NONE  Family History  Problem Relation Age of Onset  . Diabetes Father   . Diabetes Paternal Grandmother   . Diabetes Paternal Grandfather   . Colon cancer Neg Hx   . Inflammatory bowel disease Neg Hx     Social History   Social History  . Marital Status: Legally Separated   Spouse Name: N/A  . Number of Children: N/A  . Years of Education: N/A   Occupational History  . Not on file.   Social History Main Topics  . Smoking status: Current Some Day Smoker -- 0.25 packs/day for 22 years    Types: Cigarettes  . Smokeless tobacco: Never Used  . Alcohol Use: Yes     Comment: daily at present  . Drug Use: No  . Sexual Activity: Yes    Birth Control/ Protection: None   Other Topics Concern  . Not on file   Social History Narrative   Review of Systems: See HPI, otherwise negative ROS  Physical Exam: BP 94/53 mmHg  Pulse 73  Temp(Src) 98.5 F (36.9 C) (Oral)  Resp 16  Ht 5\' 5"  (1.651 m)  Wt 190 lb (86.183 kg)  BMI 31.62 kg/m2  SpO2 100%  LMP 03/19/2015 General:   Alert,  pleasant and cooperative in NAD Head:  Normocephalic and atraumatic. Neck:  Supple; Lungs:  Clear throughout to auscultation.    Heart:  Regular rate and rhythm. Abdomen:  Soft, nontender and nondistended. Normal bowel sounds, without guarding, and without rebound.   Neurologic:  Alert and  oriented x4;  grossly normal neurologically.  Impression/Plan:    RECTAL BLEEDING  PLAN:  1. TCS/? Hemorrhoid banding TODAY DISCUSSED PROCEDURE, BENEFITS, & RISKS: < 1% chance of medication reaction, bleeding, perforation, CAPSULE RETENTION, or  PELVIC VEIN SEPSIS.

## 2015-04-01 NOTE — ED Notes (Signed)
Pt reports rectal pain radiating down right leg.  Pt had hemorrhoidal banding Saturday.  Pt has not had any rectal bleeding since.

## 2015-04-01 NOTE — Telephone Encounter (Signed)
Called pt. No answer. LMOM for pt to do a fleet enema now and do another at the hospital. [facility] for pt to go ahead and report to [facility] at 1:30pm

## 2015-04-01 NOTE — ED Provider Notes (Signed)
CSN: [id number]     Arrival date & time 04/01/15  1122 History   First MD Initiated Contact with Patient 04/01/15 1155     Chief Complaint  Patient presents with  . Rectal Pain     (Consider location/radiation/quality/duration/timing/severity/associated sxs/prior Treatment) HPI Patient complains of rectal pain and perianal pain which radiates down her right leg onset 3 days ago. Patient had hemorrhoidal banding performed 03/27/2015 by Dr. Oneida Alar. She had a soft bowel movement today after taking laxatives. No blood per rectum. No abdominal pain. No fever. Pain is worse with having a bowel movement and worse with sitting. Not improved by anything. She is treated herself with "rectal cream." Without relief. Denies lightheadedness. No other associated symptoms Past Medical History  Diagnosis Date  . Hydrosalpinx   . HSV (herpes simplex virus) infection   . Vaginal Pap smear, abnormal   . Pelvic pain in female 01/27/2014  . Menorrhagia with regular cycle 01/27/2014  . Rectal bleeding 01/27/2014  . Fecal occult blood test positive 01/27/2014  . Endometrial polyp 02/11/2014  . History of abnormal cervical Pap smear 02/11/2014   Past Surgical History  Procedure Laterality Date  . Ectopic pregnancy surgery Left 2005  . Colonoscopy N/A 03/23/2014    SLF: 1. The examined terminal ileum appeared to be normal. 2. The left colon is redundant 3. Moderate sized internal hemorrhoids  . Hemorrhoid banding N/A 03/23/2014    Procedure: HEMORRHOID BANDING;  Surgeon: Danie Binder, MD;  [facility]: [facility];  Service: Endoscopy;  Laterality: N/A;  . Hysteroscopy w/d&c N/A 06/16/2014    Procedure: DILATATION AND CURETTAGE /HYSTEROSCOPY;  Surgeon: Jonnie Kind, MD;  Location: [facility];  Service: Gynecology;  Laterality: N/A;  . Polypectomy N/A 06/16/2014    Procedure: POLYPECTOMY (endometrial);  Surgeon: Jonnie Kind, MD;  Location: [facility];  Service: Gynecology;  Laterality: N/A;  . Laparoscopic  unilateral salpingectomy Right 06/16/2014    Procedure: LAPAROSCOPIC UNILATERAL SALPINGECTOMY;  Surgeon: Jonnie Kind, MD;  Location: [facility];  Service: Gynecology;  Laterality: Right;   Family History  Problem Relation Age of Onset  . Diabetes Father   . Diabetes Paternal Grandmother   . Diabetes Paternal Grandfather   . Colon cancer Neg Hx   . Inflammatory bowel disease Neg Hx    Social History  Substance Use Topics  . Smoking status: Current Some Day Smoker -- 0.25 packs/day for 22 years    Types: Cigarettes  . Smokeless tobacco: Never Used  . Alcohol Use: Yes     Comment: daily at present   OB History    Gravida Para Term Preterm AB TAB SAB Ectopic Multiple Living   1    1   1        Review of Systems  Constitutional: Negative.   HENT: Negative.   Respiratory: Negative.   Cardiovascular: Negative.   Gastrointestinal: Positive for rectal pain.  Musculoskeletal: Positive for myalgias.       Right leg pain  Skin: Negative.   Neurological: Negative.   Psychiatric/Behavioral: Negative.   All other systems reviewed and are negative.     Allergies  Review of patient's allergies indicates no known allergies.  Home Medications   Prior to Admission medications   Medication Sig Start Date End Date Taking? Authorizing Provider  pramoxine-hydrocortisone (PROCTOCREAM-HC) 1-1 % rectal cream USE PR 4 TIMES A DAY FOR 10 DAYS. 03/28/15   Samuella Cota, MD   BP 112/57 mmHg  Pulse 100  Temp(Src) 99.1  F (37.3 C) (Oral)  Resp 16  Ht 5\' 3"  (1.6 m)  Wt 180 lb (81.647 kg)  BMI 31.89 kg/m2  SpO2 100%  LMP 03/19/2015 Physical Exam  Constitutional: She is oriented to person, place, and time. She appears well-developed and well-nourished.  HENT:  Head: Normocephalic and atraumatic.  Eyes: Conjunctivae are normal. Pupils are equal, round, and reactive to light.  Neck: Neck supple. No tracheal deviation present. No thyromegaly present.  Cardiovascular: Normal rate and  regular rhythm.   No murmur heard. Pulmonary/Chest: Effort normal and breath sounds normal.  Abdominal: Soft. Bowel sounds are normal. She exhibits no distension. There is no tenderness.  Genitourinary:  Rectum no external lesion. I could not insert a finger due to too much pain. There is no perianal redness or swelling. There is perianal tenderness.  Musculoskeletal: Normal range of motion. She exhibits no edema or tenderness.  All 4 extremities without redness swelling or tenderness neurologically intact. Specifically, A right lower extremity without redness or swelling or cords DP pulses and PT pulses femoral pulses 2+. No temperature differential in comparison to contralateral lower extremity.  Neurological: She is alert and oriented to person, place, and time. Coordination normal.  Gait normal ear DTRs symmetric bilaterally at knee jerk and ankle jerk and biceps toes downward going bilaterally  Skin: Skin is warm and dry. No rash noted.  Psychiatric: She has a normal mood and affect.  Nursing note and vitals reviewed.   ED Course  Procedures (including critical care time) Labs Review Labs Reviewed - No data to display  Imaging Review No results found. I have personally reviewed and evaluated these images and lab results as part of my medical decision-making.   EKG Interpretation None      MDM  Dr. Oneida Alar was consulted via telephone. She expects minimal to no pain after hemorrhoidal banding. Since I am unable to perform adequate rectal exam patient will be discharged from here and Dr. fields will see her at 1:30 PM today in the endoscopy suite. Dr fields states no further ED workup needed Dx post operative pain Final diagnoses:  None        Orlie Dakin, MD 04/01/15 1236

## 2015-04-02 ENCOUNTER — Encounter (HOSPITAL_COMMUNITY): Payer: Self-pay | Admitting: Gastroenterology

## 2015-04-08 ENCOUNTER — Encounter (HOSPITAL_COMMUNITY): Payer: Self-pay | Admitting: Gastroenterology

## 2015-04-27 ENCOUNTER — Other Ambulatory Visit: Payer: Self-pay | Admitting: Family Medicine

## 2015-04-27 DIAGNOSIS — R102 Pelvic and perineal pain: Secondary | ICD-10-CM

## 2015-04-30 ENCOUNTER — Ambulatory Visit
Admission: RE | Admit: 2015-04-30 | Discharge: 2015-04-30 | Disposition: A | Discharge status: Home / Self Care | Payer: PRIVATE HEALTH INSURANCE | Source: Ambulatory Visit | Attending: Family Medicine | Admitting: Family Medicine

## 2015-04-30 DIAGNOSIS — R102 Pelvic and perineal pain: Secondary | ICD-10-CM

## 2015-04-30 IMAGING — CT CT ABD-PELV W/ CM
2 of 4 series · 16 of 46 positions shown, 18 images · IV contrast (APPLIED)
Comparison: [DATE]

CLINICAL DATA: Pelvic pain and nausea for 2 months

EXAM:
CT ABDOMEN AND PELVIS WITH CONTRAST
TECHNIQUE: Multidetector CT imaging of the abdomen and pelvis was performed
using the standard protocol following bolus administration of
intravenous contrast.
CONTRAST:  100mL [XV] IOPAMIDOL ([XV]) INJECTION 61%

[Series 2: abd/pelvis w/cm · axial · 0.64mm/px · z∈[-210,+180]mm · 13 of 86 slices shown, 15 images]
[im 4/86  soft-tissue]
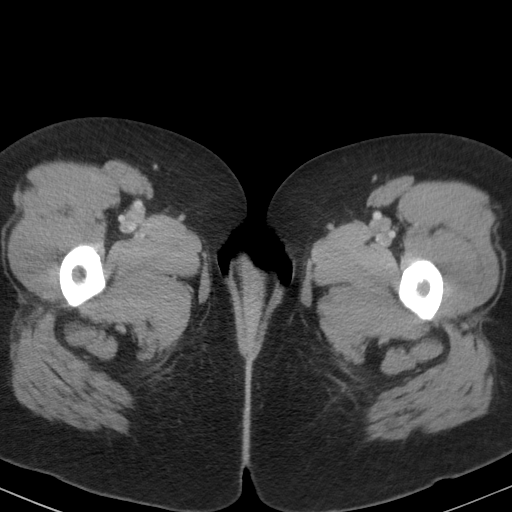
[im 4/86  bone]
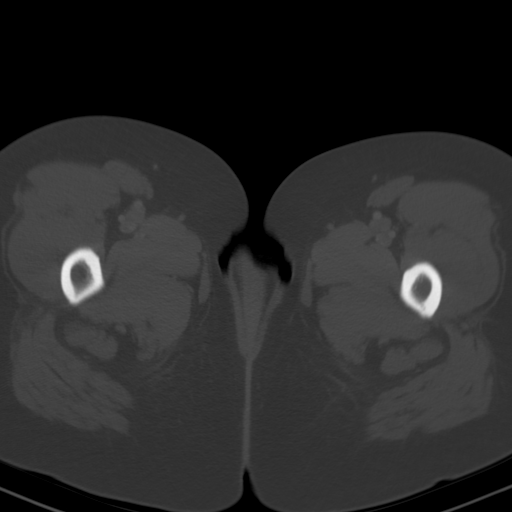
[im 11/86  soft-tissue]
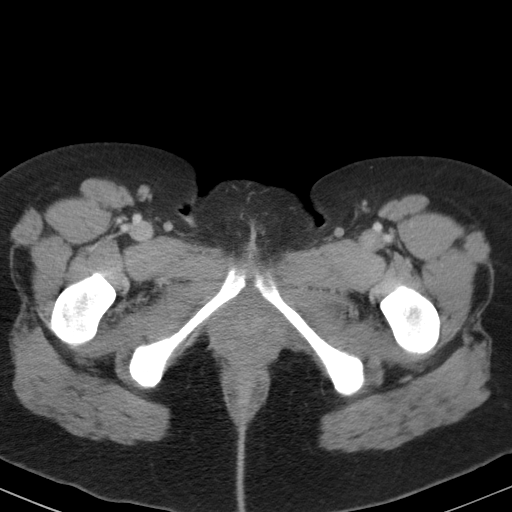
[im 18/86  soft-tissue]
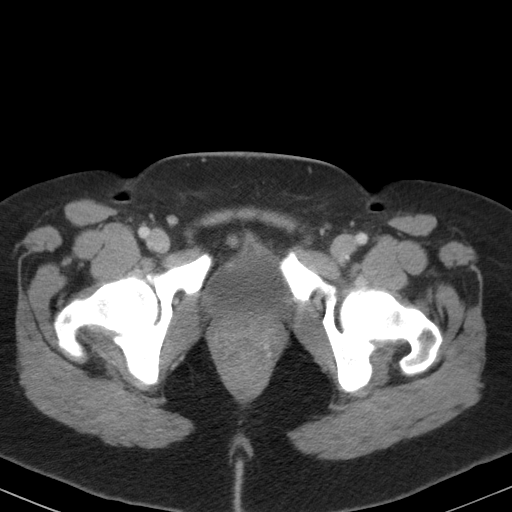
[im 24/86  soft-tissue]
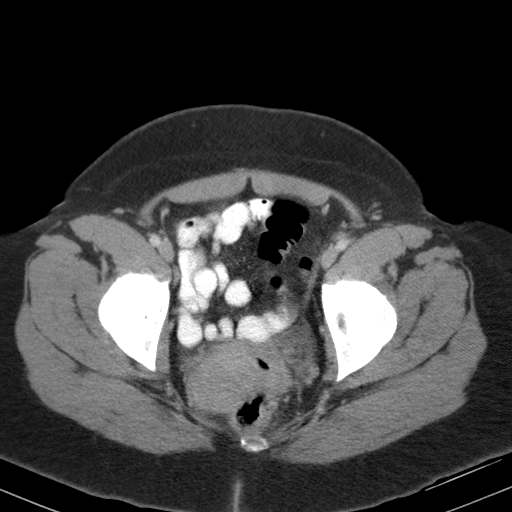
[im 31/86  soft-tissue]
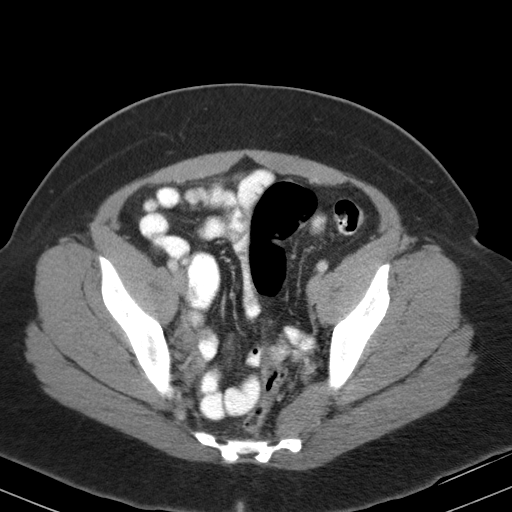
[im 38/86  soft-tissue]
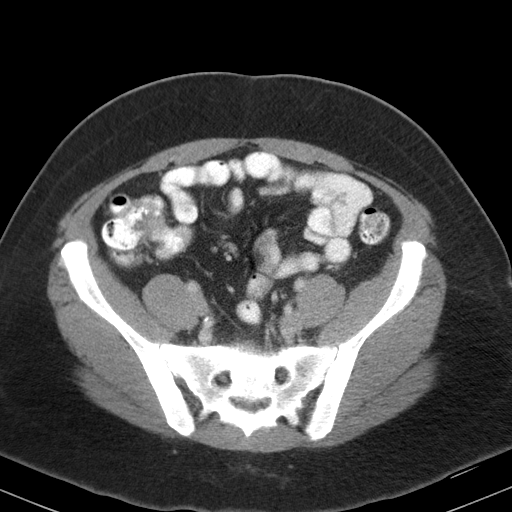
[im 45/86  soft-tissue]
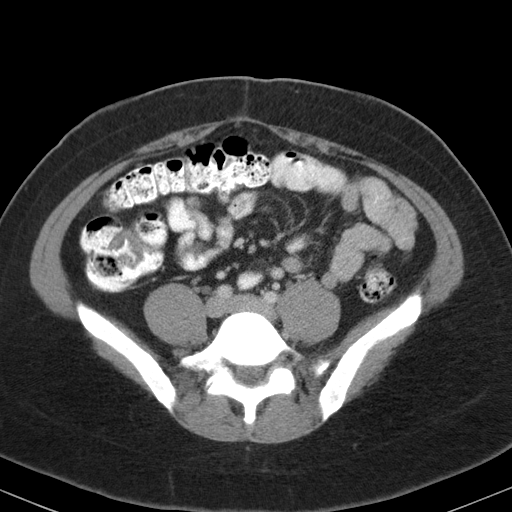
[im 48/86  soft-tissue]
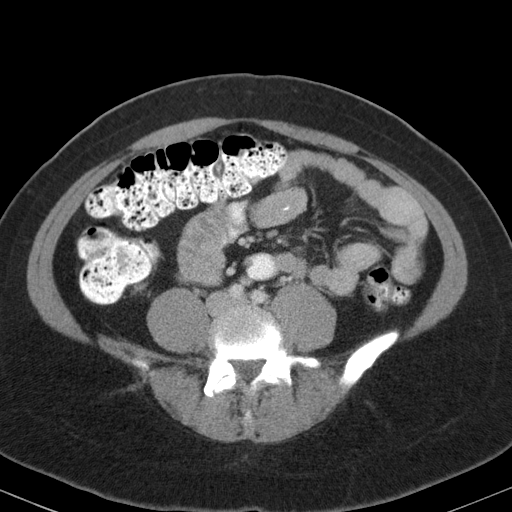
[im 55/86  soft-tissue]
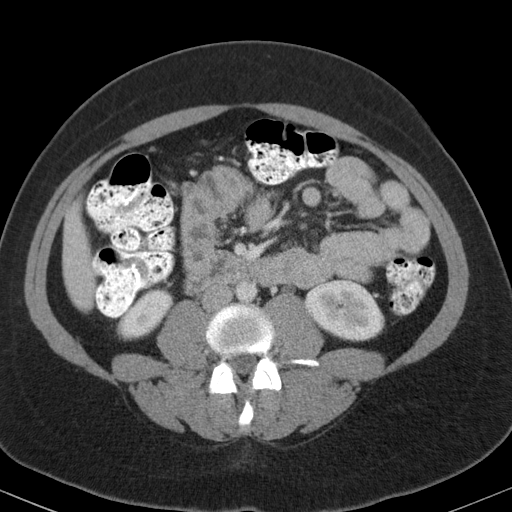
[im 55/86  bone]
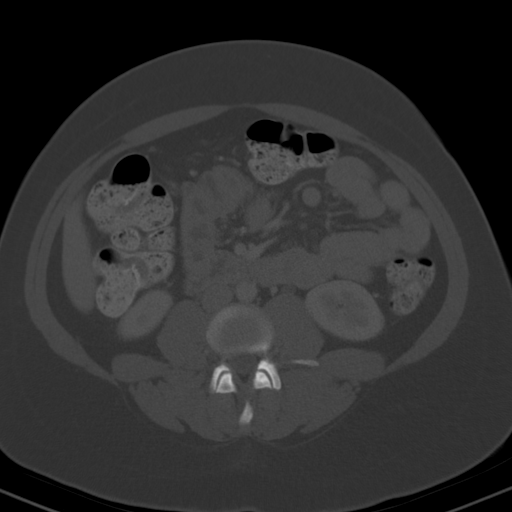
[im 62/86  soft-tissue]
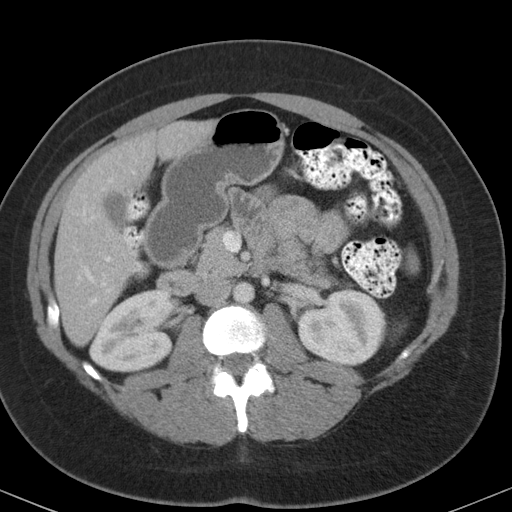
[im 69/86  soft-tissue]
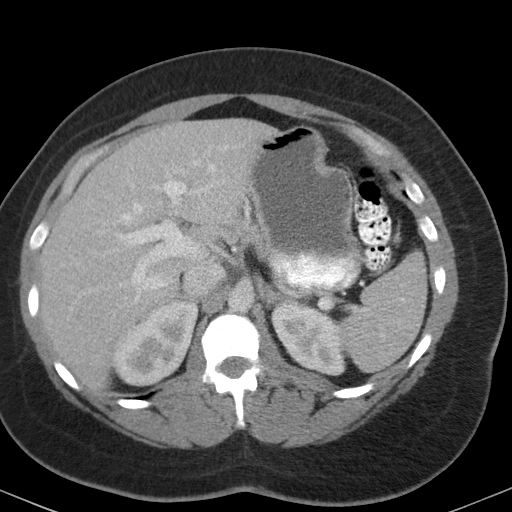
[im 75/86  soft-tissue]
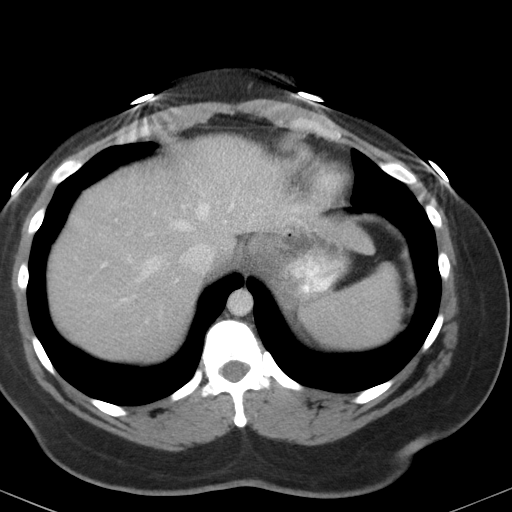
[im 82/86  soft-tissue]
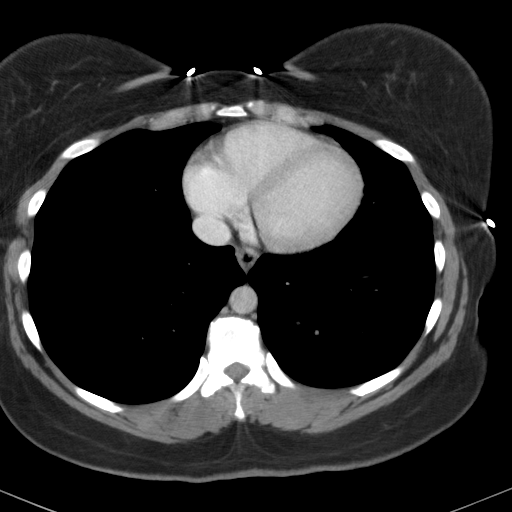

[Series 3: cor · coronal · 0.66mm/px · 3 of 93 slices shown]
[im 31/93  soft-tissue]
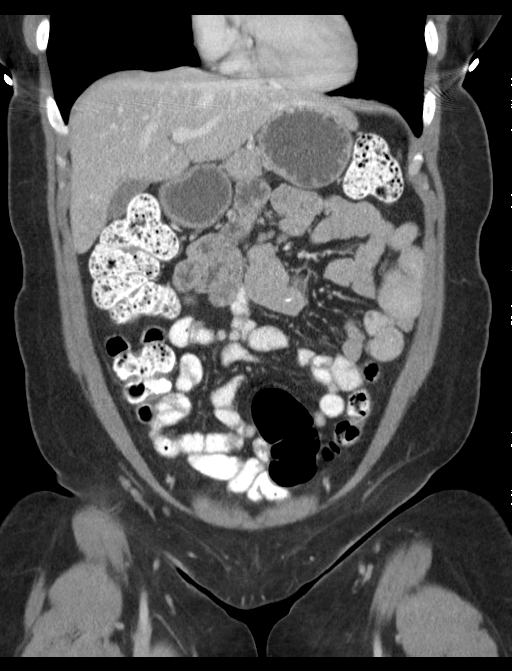
[im 41/93  soft-tissue]
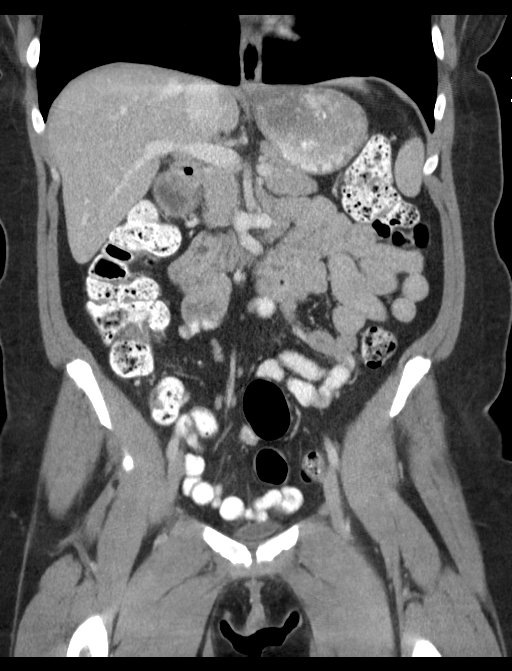
[im 52/93  soft-tissue]
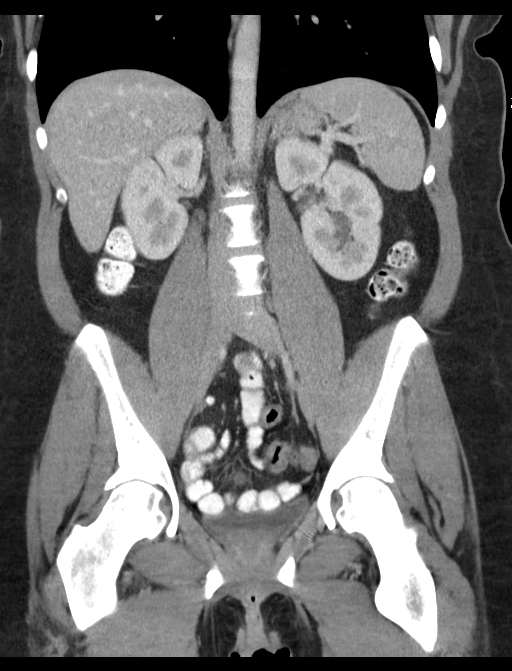

[16 of 46 positions shown; findings below may reference images not displayed]

FINDINGS: The lung bases are free of acute infiltrate or sizable effusion.

The liver, spleen, gallbladder, adrenal glands and pancreas are
within normal limits. Kidneys demonstrate no evidence of renal
calculi. Normal enhancement is noted. No obstructive changes are
seen.

The appendix is within normal limits. The bladder is decompressed.
No pelvic mass lesion is noted. The uterus is within normal limits.
The ovaries appear within normal limits. Minimal free pelvic fluid
is noted which may be physiologic in nature. The osseous structures
are within normal limits.
IMPRESSION: No acute abnormality to correspond with the patient's clinical
symptomatology is noted.

## 2015-04-30 MED ORDER — IOPAMIDOL (ISOVUE-300) INJECTION 61%
100.0000 mL | Freq: Once | INTRAVENOUS | Status: AC | PRN
  Administered 2015-04-30: 100 mL via INTRAVENOUS

## 2015-06-08 ENCOUNTER — Other Ambulatory Visit: Payer: Self-pay | Admitting: Family Medicine

## 2015-06-08 ENCOUNTER — Ambulatory Visit
Admission: RE | Admit: 2015-06-08 | Discharge: 2015-06-08 | Disposition: A | Discharge status: Home / Self Care | Payer: PRIVATE HEALTH INSURANCE | Source: Ambulatory Visit | Attending: Family Medicine | Admitting: Family Medicine

## 2015-06-08 DIAGNOSIS — R079 Chest pain, unspecified: Secondary | ICD-10-CM

## 2015-06-08 IMAGING — CR DG CHEST 2V
2 series · 2 of 2 positions shown · non-contrast
Comparison: [DATE]

CLINICAL DATA: RIGHT-side posterior chest pain at scapular
radiating anteriorly to breast for 2 months, worsening, shortness of
breath, cough, smoker

EXAM:
CHEST  2 VIEW

[w chest pa]
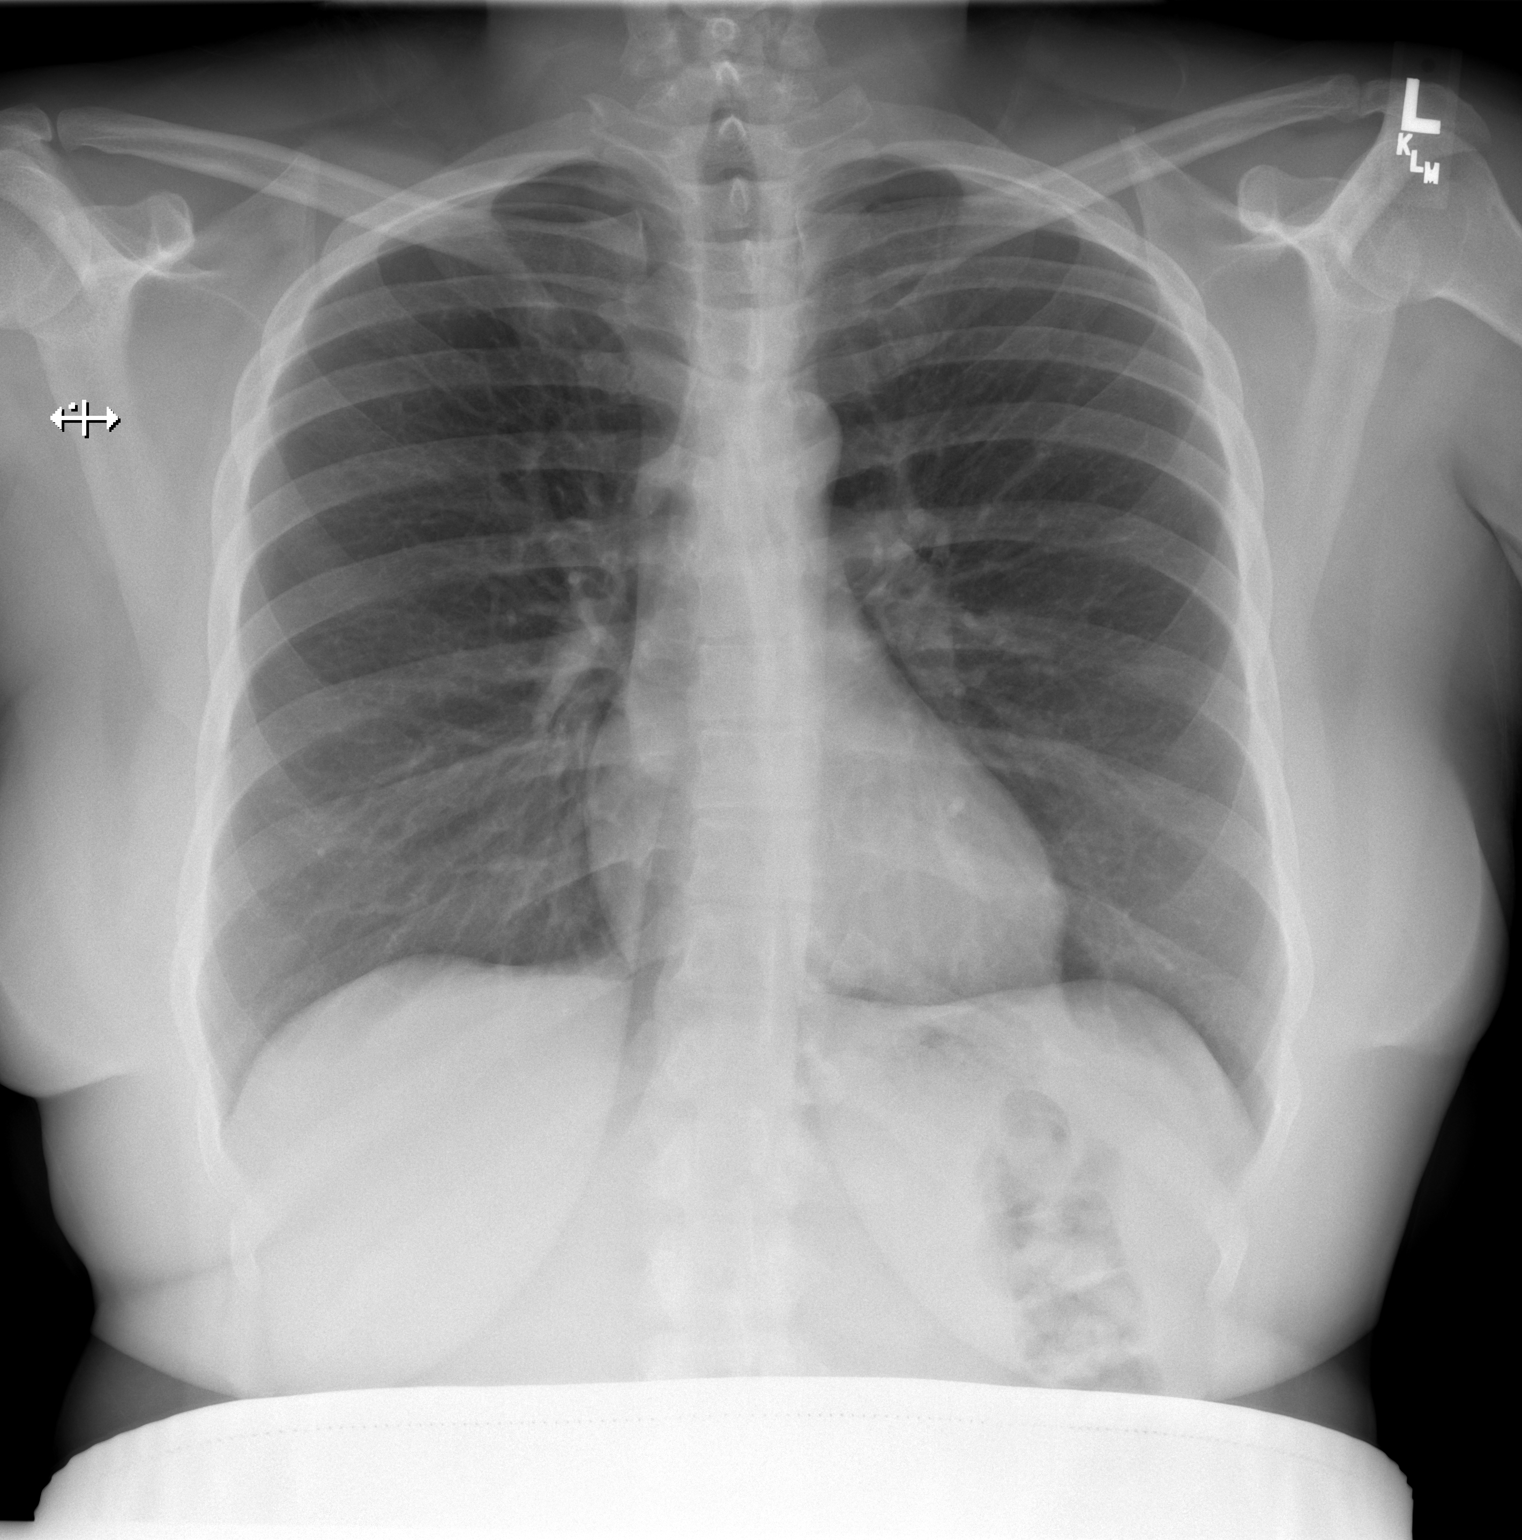

[w chest lat]
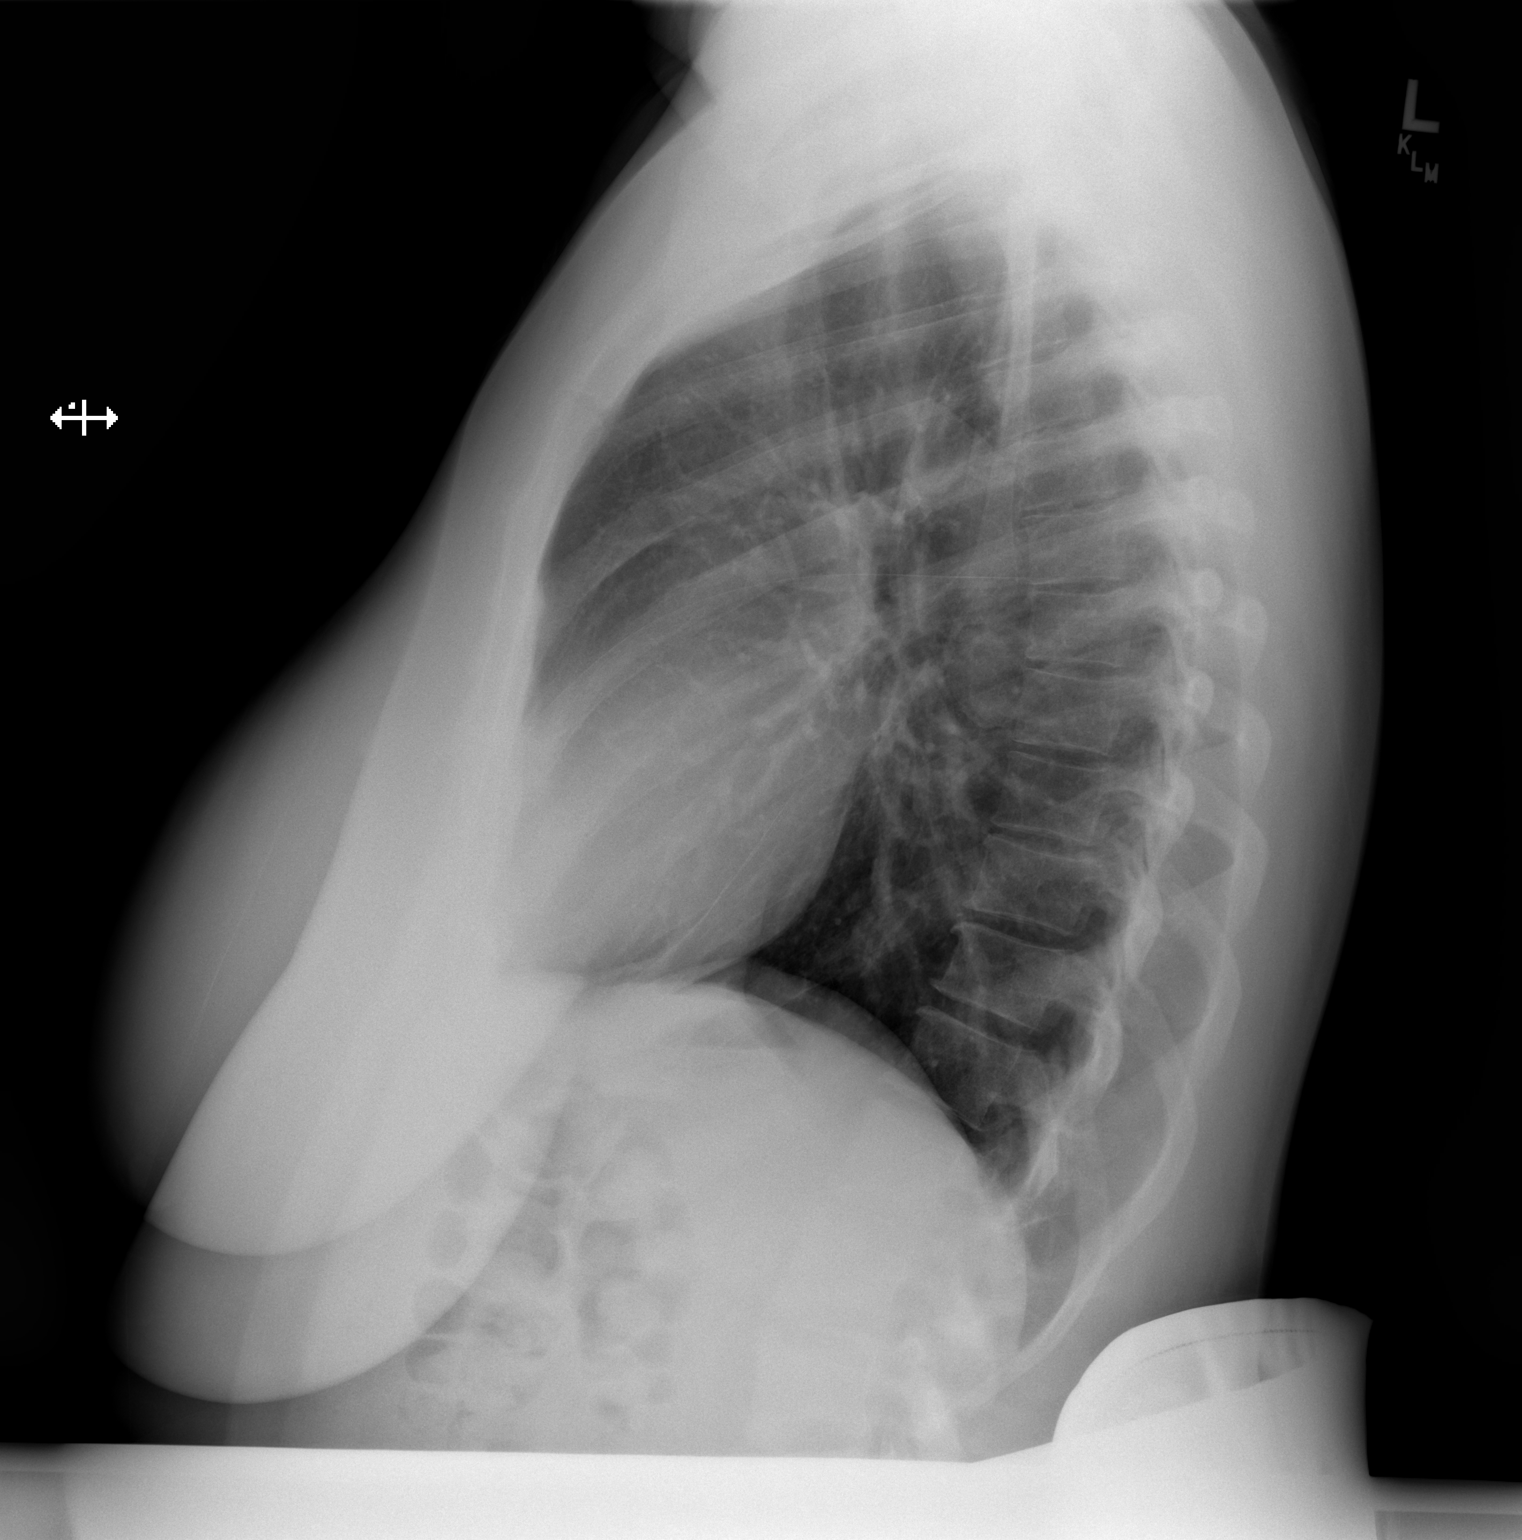

[2 of 2 positions shown; findings below may reference images not displayed]

FINDINGS: Normal heart size, mediastinal contours, and pulmonary vascularity.

Lungs clear.

No pleural effusion or pneumothorax.

Bones unremarkable.

Specifically, RIGHT scapula and RIGHT ribs show no focal
abnormalities.
IMPRESSION: Normal exam.

## 2015-06-28 ENCOUNTER — Ambulatory Visit (INDEPENDENT_AMBULATORY_CARE_PROVIDER_SITE_OTHER): Payer: PRIVATE HEALTH INSURANCE | Admitting: Gastroenterology

## 2015-06-28 ENCOUNTER — Encounter: Payer: Self-pay | Admitting: Gastroenterology

## 2015-06-28 ENCOUNTER — Encounter (INDEPENDENT_AMBULATORY_CARE_PROVIDER_SITE_OTHER): Payer: Self-pay

## 2015-06-28 VITALS — BP 131/81 | HR 101 | Temp 97.8°F | Ht 63.0 in | Wt 170.4 lb

## 2015-06-28 DIAGNOSIS — K6289 Other specified diseases of anus and rectum: Secondary | ICD-10-CM

## 2015-06-28 DIAGNOSIS — K625 Hemorrhage of anus and rectum: Secondary | ICD-10-CM

## 2015-06-28 NOTE — Progress Notes (Signed)
Primary Care Physician: Jonathon Bellows, MD  Primary Gastroenterologist:  Barney Drain, MD   Chief Complaint  Patient presents with  . Rectal Pain  . Rectal Bleeding    HPI: Meghan Carter is a 35 y.o. female here for follow-up of rectal pain/rectal bleeding. She also has a history of iron deficiency anemia felt to be multifactorial in the setting of hematochezia and heavy menses, poor iron intake.  Patient underwent a colonoscopy in February 2016. She had normal terminal ileum, redundant colon, internal hemorrhoids status post band ligation. February 2017 she underwent a flexible sigmoidoscopy with 3 bands placed on large internal hemorrhoids at the time. Repeat flexible sigmoidoscopy 5 days later for severe rectal pain. Bands no longer present. She had rectal ulcer just above the dentate line, from prior banding.  Patient presents today quite frustrated. "Earlie Server are getting paid but I have not been fixed". Complaining of ongoing rectal pain every time she has a bowel movement. Complains of hemorrhoids hanging out after each bowel movement, she has to push them back in. States she has been in a lot of pain. Continues to have difficulty passing a bowel movement, sometimes stools hard, sometimes soft. Has to go daily. Continues to see blood couple times per week. None of the hemorrhoid creams have helped. Lidocaine did not help with the pain. Denies abdominal pain or upper GI symptoms. States she has a new PCP at [facility], Dr. Maurice Small. Patient tells me that she offered to send her to [facility] patient decided to come here one last time. Notes from Dr. Jason Nest office states that [facility] has declined to see the patient.   Current Outpatient Prescriptions  Medication Sig Dispense Refill  . cyclobenzaprine (FLEXERIL) 10 MG tablet Take 10 mg by mouth 2 (two) times daily as needed.  0  . docusate sodium (COLACE) 100 MG capsule Take 100 mg by mouth 2 (two) times daily.    .  pramoxine-hydrocortisone (PROCTOCREAM-HC) 1-1 % rectal cream USE PR 4 TIMES A DAY FOR 10 DAYS. 30 g 0  . sertraline (ZOLOFT) 50 MG tablet Take 50 mg by mouth daily.  0  . traMADol (ULTRAM) 50 MG tablet Take 50 mg by mouth 3 (three) times daily as needed.  0   No current facility-administered medications for this visit.    Allergies as of 06/28/2015  . (No Known Allergies)   Past Medical History  Diagnosis Date  . Hydrosalpinx   . HSV (herpes simplex virus) infection   . Vaginal Pap smear, abnormal   . Pelvic pain in female 01/27/2014  . Menorrhagia with regular cycle 01/27/2014  . Rectal bleeding 01/27/2014  . Fecal occult blood test positive 01/27/2014  . Endometrial polyp 02/11/2014  . History of abnormal cervical Pap smear 02/11/2014   Past Surgical History  Procedure Laterality Date  . Ectopic pregnancy surgery Left 2005  . Colonoscopy N/A 03/23/2014    SLF: 1. The examined terminal ileum appeared to be normal. 2. The left colon is redundant 3. Moderate sized internal hemorrhoids  . Hemorrhoid banding N/A 03/23/2014    Procedure: HEMORRHOID BANDING;  Surgeon: Danie Binder, MD;  [facility]: [facility];  Service: Endoscopy;  Laterality: N/A;  . Hysteroscopy w/d&c N/A 06/16/2014    Procedure: DILATATION AND CURETTAGE /HYSTEROSCOPY;  Surgeon: Jonnie Kind, MD;  Location: [facility];  Service: Gynecology;  Laterality: N/A;  . Polypectomy N/A 06/16/2014    Procedure: POLYPECTOMY (endometrial);  Surgeon: Jonnie Kind,  MD;  Location: [facility];  Service: Gynecology;  Laterality: N/A;  . Laparoscopic unilateral salpingectomy Right 06/16/2014    Procedure: LAPAROSCOPIC UNILATERAL SALPINGECTOMY;  Surgeon: Jonnie Kind, MD;  Location: [facility];  Service: Gynecology;  Laterality: Right;  . Flexible sigmoidoscopy N/A 04/01/2015    SLF: rectal bleeding due to large internal hemorrhoids 2. anemia due to rectal bleeding/memses/ poor iron intake.  . Flexible sigmoidoscopy N/A 03/27/2015    Procedure:  FLEXIBLE SIGMOIDOSCOPY;  Surgeon: Danie Binder, MD;  [facility]: [facility];  Service: Endoscopy;  Laterality: N/A;  . Hemorrhoid banding  03/27/2015    Procedure: HEMORRHOID BANDING;  Surgeon: Danie Binder, MD;  Location: [facility];  Service: Endoscopy;;    ROS:  General: Negative for anorexia, weight loss, fever, chills, fatigue, weakness. ENT: Negative for hoarseness, difficulty swallowing , nasal congestion. CV: Negative for chest pain, angina, palpitations, dyspnea on exertion, peripheral edema.  Respiratory: Negative for dyspnea at rest, dyspnea on exertion, cough, sputum, wheezing.  GI: See history of present illness. GU:  Negative for dysuria, hematuria, urinary incontinence, urinary frequency, nocturnal urination.  Endo: Negative for unusual weight change.    Physical Examination:   BP 131/81 mmHg  Pulse 101  Temp(Src) 97.8 F (36.6 C) (Oral)  Ht 5\' 3"  (1.6 m)  Wt 170 lb 6.4 oz (77.293 kg)  BMI 30.19 kg/m2  LMP 06/21/2015  General: Well-nourished, well-developed in no acute distress.  Eyes: No icterus.  Rectal: small skin tag noted externally. DRE, patient complained of pain and asked me to stop before exam complete. Pain with palpation within anal canal. No stool present.   Neuro: Alert and oriented x 4   Skin: Warm and dry, no jaundice.   Psych: Alert and cooperative, normal mood and affect.  Labs:  Lab Results  Component Value Date   WBC 8.4 03/28/2015   HGB 8.4* 03/28/2015   HCT 25.9* 03/28/2015   MCV 74.2* 03/28/2015   PLT 255 03/28/2015   No results found for: IRON, TIBC, FERRITIN Lab Results  Component Value Date   ALT 12* 03/26/2015   AST 17 03/26/2015   ALKPHOS 54 03/26/2015   BILITOT 0.2* 03/26/2015   Lab Results  Component Value Date   CREATININE 0.56 03/27/2015   BUN 8 03/27/2015   NA 136 03/27/2015   K 3.9 03/27/2015   CL 110 03/27/2015   CO2 20* 03/27/2015    Imaging Studies: Dg Chest 2 View  06/08/2015  CLINICAL DATA:   RIGHT-side posterior chest pain at scapular radiating anteriorly to breast for 2 months, worsening, shortness of breath, cough, smoker EXAM: CHEST  2 VIEW COMPARISON:  06/14/2011 FINDINGS: Normal heart size, mediastinal contours, and pulmonary vascularity. Lungs clear. No pleural effusion or pneumothorax. Bones unremarkable. Specifically, RIGHT scapula and RIGHT ribs show no focal abnormalities. IMPRESSION: Normal exam. Electronically Signed   By: Lavonia Dana M.D.   On: 06/08/2015 16:56

## 2015-06-28 NOTE — Patient Instructions (Signed)
1. I will let you know what Dr. Oneida Alar recommends for ongoing rectal pain/bleeding. 2. I will review recent anemia labs once received from your PCP. Further recommendations to follow.

## 2015-06-29 NOTE — Assessment & Plan Note (Signed)
35 year old female with ongoing rectal pain associated with rectal bleeding. Previous hemorrhoid band ligation as outlined above. Last flexible sigmoidoscopy 5 days post banding showed small ulceration above the dentate line which is to be expected. Last complete colonoscopy 1 year ago normal except for sized internal hemorrhoids. Suspect ongoing hemorrhoidal disease and/or anal fissure. Patient reluctant to buying anymore creams. I will discuss further with Dr. Oneida Alar to see if she needs to reexamine patient. Further recommendations to follow.  Patient also has a history of microcytic anemia in the setting of heavy menses. Follow-up on recent blood work.

## 2015-06-30 ENCOUNTER — Telehealth: Payer: Self-pay

## 2015-06-30 NOTE — Telephone Encounter (Signed)
PT IS CALLING TO SEE IF YOU HAVE TALKED WITH SLF ABOUT HER. PLEASE ADVISE.

## 2015-07-01 NOTE — Progress Notes (Signed)
CC'ED TO PCP 

## 2015-07-01 NOTE — Telephone Encounter (Signed)
LMOM for a return call.  

## 2015-07-01 NOTE — Telephone Encounter (Signed)
Please let patient know that I discussed situation with Dr. Oneida Alar. She has tried hemorrhoid banding twice but patient remains symptomatic. She says to refer patient to general surgery for consideration of anoscopy with hemorrhoidectomy.  For her anemia, her labs are much better but still with mild iron def anemia. Recommend continued follow up with PCP for this matter or she can come here for follow up in four months with SLF for this matter.

## 2015-07-01 NOTE — Progress Notes (Signed)
Reviewed labs from PCP. Dated 04/26/2015, hemoglobin 10.3, hematocrit 33, MCV 72, platelets 311,000, white blood cell count 6100

## 2015-07-01 NOTE — Telephone Encounter (Signed)
Pt called back to see if LSL had talked with SLF. I told her that they both was seeing patient and we would tried to call her tomorrow.

## 2015-07-02 ENCOUNTER — Telehealth: Payer: Self-pay | Admitting: Gastroenterology

## 2015-07-02 NOTE — Telephone Encounter (Signed)
See previous note, pt is aware.

## 2015-07-02 NOTE — Telephone Encounter (Signed)
Pt is aware. She would like for [place] to send this note to her PCP at [facility]. She would like for her to be the one to refer her to a Psychologist, sport and exercise.  She will also follow up with PCP for anemia.   Forwarding to Manuela Schwartz to send to PCP and FYI to Dr.Fields.

## 2015-07-02 NOTE — Telephone Encounter (Signed)
REVIEWED-NO ADDITIONAL RECOMMENDATIONS. 

## 2015-07-02 NOTE — Telephone Encounter (Signed)
Pt was returning a call from yesterday. Please call [phone]

## 2015-07-08 NOTE — Progress Notes (Signed)
REVIEWED-NO ADDITIONAL RECOMMENDATIONS. PT WAS TOLD IN FEB 2016 TO SEE [facility] TO HAVE HER HEMORRHOIDS FIXED. SHE HAS FAILED MEDICAL MANAGEMENT. NO MORE ENDOSCOPY INDICATED FOR HER RECTAL PAIN.

## 2015-09-15 ENCOUNTER — Emergency Department (HOSPITAL_COMMUNITY)
Admission: EM | Admit: 2015-09-15 | Discharge: 2015-09-15 | Disposition: A | Discharge status: Home / Self Care | Payer: PRIVATE HEALTH INSURANCE | Attending: Emergency Medicine | Admitting: Emergency Medicine

## 2015-09-15 ENCOUNTER — Encounter (HOSPITAL_COMMUNITY): Payer: Self-pay | Admitting: *Deleted

## 2015-09-15 DIAGNOSIS — N939 Abnormal uterine and vaginal bleeding, unspecified: Secondary | ICD-10-CM | POA: Insufficient documentation

## 2015-09-15 DIAGNOSIS — F17210 Nicotine dependence, cigarettes, uncomplicated: Secondary | ICD-10-CM | POA: Insufficient documentation

## 2015-09-15 LAB — URINE MICROSCOPIC-ADD ON

## 2015-09-15 LAB — BASIC METABOLIC PANEL
ANION GAP: 6 (ref 5–15)
BUN: 12 mg/dL (ref 6–20)
CALCIUM: 8.9 mg/dL (ref 8.9–10.3)
CO2: 24 mmol/L (ref 22–32)
CREATININE: 0.73 mg/dL (ref 0.44–1.00)
Chloride: 106 mmol/L (ref 101–111)
GFR calc Af Amer: >60 mL/min (ref >60)
GLUCOSE: 90 mg/dL (ref 65–99)
Potassium: 4 mmol/L (ref 3.5–5.1)
Sodium: 136 mmol/L (ref 135–145)

## 2015-09-15 LAB — CBC WITH DIFFERENTIAL/PLATELET
BASOS PCT: 0 %
Basophils Absolute: 0 10*3/uL (ref 0.0–0.1)
EOS ABS: 0.1 10*3/uL (ref 0.0–0.7)
Eosinophils Relative: 3 %
HEMATOCRIT: 40.1 % (ref 36.0–46.0)
Hemoglobin: 12.7 g/dL (ref 12.0–15.0)
LYMPHS ABS: 1.8 10*3/uL (ref 0.7–4.0)
Lymphocytes Relative: 34 %
MCH: 25.2 pg — ABNORMAL LOW (ref 26.0–34.0)
MCHC: 31.7 g/dL (ref 30.0–36.0)
MCV: 79.6 fL (ref 78.0–100.0)
MONOS PCT: 12 %
Monocytes Absolute: 0.6 10*3/uL (ref 0.1–1.0)
NEUTROS PCT: 51 %
Neutro Abs: 2.7 10*3/uL (ref 1.7–7.7)
PLATELETS: 280 10*3/uL (ref 150–400)
RBC: 5.04 MIL/uL (ref 3.87–5.11)
RDW: 18.4 % — AB (ref 11.5–15.5)
WBC: 5.2 10*3/uL (ref 4.0–10.5)

## 2015-09-15 LAB — WET PREP, GENITAL
Clue Cells Wet Prep HPF POC: NONE SEEN
SPERM: NONE SEEN
TRICH WET PREP: NONE SEEN
YEAST WET PREP: NONE SEEN

## 2015-09-15 LAB — URINALYSIS, ROUTINE W REFLEX MICROSCOPIC
BILIRUBIN URINE: NEGATIVE
Glucose, UA: NEGATIVE mg/dL
KETONES UR: NEGATIVE mg/dL
Leukocytes, UA: NEGATIVE
NITRITE: NEGATIVE
Specific Gravity, Urine: 1.02 (ref 1.005–1.030)
pH: 8 (ref 5.0–8.0)

## 2015-09-15 LAB — I-STAT BETA HCG BLOOD, ED (MC, WL, AP ONLY): I-stat hCG, quantitative: <5 m[IU]/mL (ref <5)

## 2015-09-15 LAB — POC OCCULT BLOOD, ED: Fecal Occult Bld: NEGATIVE

## 2015-09-15 LAB — PREGNANCY, URINE: PREG TEST UR: NEGATIVE

## 2015-09-15 MED ORDER — CEFTRIAXONE SODIUM 250 MG IJ SOLR
250.0000 mg | Freq: Once | INTRAMUSCULAR | Status: AC
  Administered 2015-09-15: 250 mg via INTRAMUSCULAR
  Filled 2015-09-15: qty 250

## 2015-09-15 MED ORDER — AZITHROMYCIN 250 MG PO TABS
1000.0000 mg | ORAL_TABLET | Freq: Once | ORAL | Status: AC
  Administered 2015-09-15: 1000 mg via ORAL
  Filled 2015-09-15: qty 4

## 2015-09-15 MED ORDER — LIDOCAINE HCL (PF) 1 % IJ SOLN
INTRAMUSCULAR | Status: AC
  Administered 2015-09-15: 5 mL
  Filled 2015-09-15: qty 5

## 2015-09-15 NOTE — ED Provider Notes (Signed)
[facility] DEPT Provider Note   CSN: [id number] Arrival date & time: 09/15/15  1507  First Provider Contact:  First MD Initiated Contact with Patient 09/15/15 1517        History   Chief Complaint Chief Complaint  Patient presents with  . Vaginal Bleeding    HPI Meghan Carter is a 35 y.o. female.  Patient presents with 1 week of intermittent vaginal bleeding. States she's been having dark vaginal blood with clots saturating 2-3 pads per day. She states this is not her normal menstrual cycle which she finished of the end of July. She is not on birth control. She describes accompanying itching and odor with this bleeding. She denies any nausea, vomiting, diarrhea. Denies any possibility of pregnancy. Denies any fever. Denies any dysuria hematuria. She's had generalized weakness and chills which she's had previously last or when she needed a blood transfusion for rectal bleeding and hemorrhoids. States she still has intermittent rectal bleeding but "doesn't want to go there today". 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   The history is provided by the patient.  Vaginal Bleeding  Primary symptoms include vaginal bleeding.  Primary symptoms include no dysuria. Pertinent negatives include no abdominal pain, no nausea, no vomiting, no light-headedness and no dizziness.    Past Medical History:  Diagnosis Date  . Endometrial polyp 02/11/2014  . Fecal occult blood test positive 01/27/2014  . History of abnormal cervical Pap smear 02/11/2014  . HSV (herpes simplex virus) infection   . Hydrosalpinx   . Menorrhagia with regular cycle 01/27/2014  . Pelvic pain in female 01/27/2014  . Rectal bleeding 01/27/2014  . Vaginal Pap smear, abnormal     Patient Active Problem List   Diagnosis Date Noted  . Acute blood loss anemia 03/26/2015  . Severe anemia   . Lower abdominal pain   . Weakness   . Other fatigue    . Rectal pain 03/19/2014  . 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 Endometrial polyp 02/11/2014  . History of abnormal cervical Pap smear 02/11/2014  . Pelvic pain in female 01/27/2014  . Menorrhagia with regular cycle 01/27/2014  . Rectal bleeding 01/27/2014  . Fecal occult blood test positive 01/27/2014    Past Surgical History:  Procedure Laterality Date  . COLONOSCOPY N/A 03/23/2014   SLF: 1. The examined terminal ileum appeared to be normal. 2. The left colon is redundant 3. Moderate sized internal hemorrhoids  . ECTOPIC PREGNANCY SURGERY Left 2005  . FLEXIBLE SIGMOIDOSCOPY N/A 04/01/2015   SLF: rectal bleeding due to large internal hemorrhoids 2. anemia due to rectal bleeding/memses/ poor iron intake.  Marland Kitchen FLEXIBLE SIGMOIDOSCOPY N/A 03/27/2015   Procedure: FLEXIBLE SIGMOIDOSCOPY;  Surgeon: Danie Binder, MD;  [facility];  Service: Endoscopy;  Laterality: N/A;  . HEMORRHOID BANDING N/A 03/23/2014   Procedure: HEMORRHOID BANDING;  Surgeon: Danie Binder, MD;  Location: [facility];  Service: Endoscopy;  Laterality: N/A;  . HEMORRHOID BANDING  03/27/2015   Procedure: HEMORRHOID BANDING;  Surgeon: Danie Binder, MD;  Location: [facility];  Service: Endoscopy;;  . HYSTEROSCOPY W/D&C N/A 06/16/2014   Procedure: DILATATION AND CURETTAGE Pollyann Glen;  Surgeon: Jonnie Kind, MD;  Location: [facility];  Service: Gynecology;  Laterality: N/A;  . LAPAROSCOPIC UNILATERAL SALPINGECTOMY Right 06/16/2014   Procedure: LAPAROSCOPIC UNILATERAL SALPINGECTOMY;  Surgeon: Jonnie Kind, MD;  Location: [facility];  Service: Gynecology;  Laterality: Right;  . POLYPECTOMY N/A 06/16/2014  Procedure: POLYPECTOMY (endometrial);  Surgeon: Jonnie Kind, MD;  Location: [facility];  Service: Gynecology;  Laterality: N/A;    OB History    Gravida Para Term Preterm AB Living   1       1     SAB TAB Ectopic Multiple Live Births       1           Home Medications    Prior to Admission medications     Medication Sig Start Date End Date Taking? Authorizing Provider  cyclobenzaprine (FLEXERIL) 10 MG tablet Take 10 mg by mouth 2 (two) times daily as needed. 06/08/15   Historical Provider, MD  docusate sodium (COLACE) 100 MG capsule Take 100 mg by mouth 2 (two) times daily.    Historical Provider, MD  pramoxine-hydrocortisone (PROCTOCREAM-HC) 1-1 % rectal cream USE PR 4 TIMES A DAY FOR 10 DAYS. 03/28/15   Samuella Cota, MD  sertraline (ZOLOFT) 50 MG tablet Take 50 mg by mouth daily. 06/08/15   Historical Provider, MD  traMADol (ULTRAM) 50 MG tablet Take 50 mg by mouth 3 (three) times daily as needed. 06/08/15   Historical Provider, MD    Family History Family History  Problem Relation Age of Onset  . Diabetes Father   . Diabetes Paternal Grandmother   . Diabetes Paternal Grandfather   . Colon cancer Neg Hx   . Inflammatory bowel disease Neg Hx     Social History Social History  Substance Use Topics  . Smoking status: Current Some Day Smoker    Packs/day: 0.25    Years: 22.00    Types: Cigarettes  . Smokeless tobacco: Never Used  . Alcohol use Yes     Comment: daily at present     Allergies   Review of patient's allergies indicates no known allergies.   Review of Systems Review of Systems  Constitutional: Positive for activity change, appetite change and fatigue. Negative for fever.  HENT: Negative for congestion and rhinorrhea.   Respiratory: Negative for cough, chest tightness and shortness of breath.   Cardiovascular: Negative for chest pain and leg swelling.  Gastrointestinal: Positive for blood in stool. Negative for abdominal pain, nausea and vomiting.  Genitourinary: Positive for vaginal bleeding. Negative for dysuria, flank pain, hematuria and vaginal pain.  Musculoskeletal: Negative for arthralgias and myalgias.  Skin: Negative for rash.  Neurological: Negative for dizziness, weakness and light-headedness.   A complete 10 system review of systems was obtained and  all systems are negative except as noted in the HPI and PMH.    Physical Exam Updated Vital Signs BP 145/86 (BP Location: Right Arm)   Pulse 84   Temp 98 F (36.7 C) (Oral)   Resp 18   Ht 5\' 3"  (1.6 m)   Wt 157 lb (71.2 kg)   LMP 08/30/2015   SpO2 100%   BMI 27.81 kg/m   Physical Exam  Constitutional: She is oriented to person, place, and time. She appears well-developed and well-nourished. No distress.  No conjunctival pallor  HENT:  Head: Normocephalic and atraumatic.  Mouth/Throat: Oropharynx is clear and moist. No oropharyngeal exudate.  Eyes: Conjunctivae and EOM are normal. Pupils are equal, round, and reactive to light.  Neck: Normal range of motion. Neck supple.  No meningismus.  Cardiovascular: Normal rate, regular rhythm, normal heart sounds and intact distal pulses.   No murmur heard. Pulmonary/Chest: Effort normal and breath sounds normal. No respiratory distress.  Abdominal: Soft. There is no tenderness. There is  no rebound and no guarding.  Genitourinary:  Genitourinary Comments: Chaperone present. Normal external genitalia. Dark blood in vaginal vault. Cervix is closed. No significant CMT or adnexal tenderness.  Rectal exam shows no hemorrhoids and no gross blood. Poor Hemoccult sample obtained and patient declines further attempts.  Musculoskeletal: Normal range of motion. She exhibits no edema or tenderness.  Neurological: She is alert and oriented to person, place, and time. No cranial nerve deficit. She exhibits normal muscle tone. Coordination normal.  No ataxia on finger to nose bilaterally. No pronator drift. 5/5 strength throughout. CN 2-12 intact.Equal grip strength. Sensation intact.   Skin: Skin is warm.  Psychiatric: She has a normal mood and affect. Her behavior is normal.  Nursing note and vitals reviewed.    ED Treatments / Results  Labs (all labs ordered are listed, but only abnormal results are displayed) Labs Reviewed  WET PREP, GENITAL  - Abnormal; Notable for the following:       Result Value   WBC, Wet Prep HPF POC MANY (*)    All other components within normal limits  URINALYSIS, ROUTINE W REFLEX MICROSCOPIC (NOT AT [facility]) - Abnormal; Notable for the following:    APPearance HAZY (*)    Hgb urine dipstick TRACE (*)    Protein, ur TRACE (*)    All other components within normal limits  CBC WITH DIFFERENTIAL/PLATELET - Abnormal; Notable for the following:    MCH 25.2 (*)    RDW 18.4 (*)    All other components within normal limits  URINE MICROSCOPIC-ADD ON - Abnormal; Notable for the following:    Squamous Epithelial / LPF 0-5 (*)    Bacteria, UA FEW (*)    All other components within normal limits  PREGNANCY, URINE  BASIC METABOLIC PANEL  I-STAT BETA HCG BLOOD, ED ([facility], [facility], [facility] ONLY)  GC/CHLAMYDIA PROBE AMP ([place]) NOT AT [facility]    EKG  EKG Interpretation None       Radiology No results found.  Procedures Procedures (including critical care time)  Medications Ordered in ED Medications - No data to display   Initial Impression / Assessment and Plan / ED Course  I have reviewed the triage vital signs and the nursing notes.  Pertinent labs & imaging results that were available during my care of the patient were reviewed by me and considered in my medical decision making (see chart for details).  Clinical Course  Comment By Time  Hemoglobin 12.7 was 8 in February HCG negative Ezequiel Essex, MD 08/09 1623  1 week of irregular vaginal bleeding, denies pain, denies fever or vomiting.  Abdomen soft. HCG negative. Patient is not pregnant Hemoglobin improved from previous.  Patient concerned about "odor".  No BV or trichomonas or yeast seen. Will treat empirically for cervicitis.  Orthostatics are negative. No indication for blood transfusion. Patient referred back to gynecology for follow-up. Pelvic ultrasound last year showed questionable endometrial polyp or mass. Patient states this was a  polyp that was removed. Advise follow-up with gynecology for repeat ultrasound. Return precautions discussed.  Final Clinical Impressions(s) / ED Diagnoses   Final diagnoses:  Vaginal bleeding    New Prescriptions New Prescriptions   No medications on file     Ezequiel Essex, MD 09/15/15 1657

## 2015-09-15 NOTE — Discharge Instructions (Signed)
Follow up with Dr.Ferguson for further testing of your endometrium.  Return to the ED if you develop new or worsening symptoms.

## 2015-09-15 NOTE — ED Triage Notes (Signed)
Pt comes in for vaginal bleeding that started 09/08/15. This is accompanied by itching and an odor. She states that she had her period the end of July.   Denies any n/v/d.

## 2015-09-17 LAB — GC/CHLAMYDIA PROBE AMP (~~LOC~~) NOT AT ARMC
CHLAMYDIA, DNA PROBE: NEGATIVE
Neisseria Gonorrhea: NEGATIVE

## 2015-09-28 ENCOUNTER — Telehealth (HOSPITAL_COMMUNITY): Payer: Self-pay

## 2015-09-28 NOTE — Telephone Encounter (Signed)
Pt calling for STD results.  ID verified.  Pt informed both Gonorrhea and Chlamydia are negative.

## 2016-03-30 ENCOUNTER — Emergency Department (HOSPITAL_COMMUNITY): Payer: Self-pay

## 2016-03-30 ENCOUNTER — Emergency Department (HOSPITAL_COMMUNITY)
Admission: EM | Admit: 2016-03-30 | Discharge: 2016-03-31 | Disposition: A | Discharge status: Home / Self Care | Payer: Self-pay | Attending: Emergency Medicine | Admitting: Emergency Medicine

## 2016-03-30 ENCOUNTER — Encounter (HOSPITAL_COMMUNITY): Payer: Self-pay | Admitting: *Deleted

## 2016-03-30 DIAGNOSIS — F10930 Alcohol use, unspecified with withdrawal, uncomplicated: Secondary | ICD-10-CM

## 2016-03-30 DIAGNOSIS — Z79899 Other long term (current) drug therapy: Secondary | ICD-10-CM | POA: Insufficient documentation

## 2016-03-30 DIAGNOSIS — K625 Hemorrhage of anus and rectum: Secondary | ICD-10-CM | POA: Insufficient documentation

## 2016-03-30 DIAGNOSIS — F17210 Nicotine dependence, cigarettes, uncomplicated: Secondary | ICD-10-CM | POA: Insufficient documentation

## 2016-03-30 DIAGNOSIS — F10230 Alcohol dependence with withdrawal, uncomplicated: Secondary | ICD-10-CM | POA: Insufficient documentation

## 2016-03-30 DIAGNOSIS — R0789 Other chest pain: Secondary | ICD-10-CM | POA: Insufficient documentation

## 2016-03-30 LAB — CBC
HCT: 37.9 % (ref 36.0–46.0)
HEMOGLOBIN: 12.9 g/dL (ref 12.0–15.0)
MCH: 29 pg (ref 26.0–34.0)
MCHC: 34 g/dL (ref 30.0–36.0)
MCV: 85.2 fL (ref 78.0–100.0)
PLATELETS: 246 10*3/uL (ref 150–400)
RBC: 4.45 MIL/uL (ref 3.87–5.11)
RDW: 13.3 % (ref 11.5–15.5)
WBC: 5.9 10*3/uL (ref 4.0–10.5)

## 2016-03-30 LAB — I-STAT TROPONIN, ED: TROPONIN I, POC: 0 ng/mL (ref 0.00–0.08)

## 2016-03-30 LAB — BASIC METABOLIC PANEL
Anion gap: 6 (ref 5–15)
BUN: 14 mg/dL (ref 6–20)
CHLORIDE: 105 mmol/L (ref 101–111)
CO2: 26 mmol/L (ref 22–32)
CREATININE: 0.78 mg/dL (ref 0.44–1.00)
Calcium: 9.4 mg/dL (ref 8.9–10.3)
GFR calc Af Amer: >60 mL/min (ref >60)
GFR calc non Af Amer: >60 mL/min (ref >60)
GLUCOSE: 98 mg/dL (ref 65–99)
Potassium: 3.7 mmol/L (ref 3.5–5.1)
Sodium: 137 mmol/L (ref 135–145)

## 2016-03-30 IMAGING — DX DG CHEST 2V
2 series · 2 of 2 positions shown · non-contrast
Comparison: Chest radiograph [DATE]

CLINICAL DATA: Left-sided chest pain for 3 days

EXAM:
CHEST  2 VIEW

[chest pa]
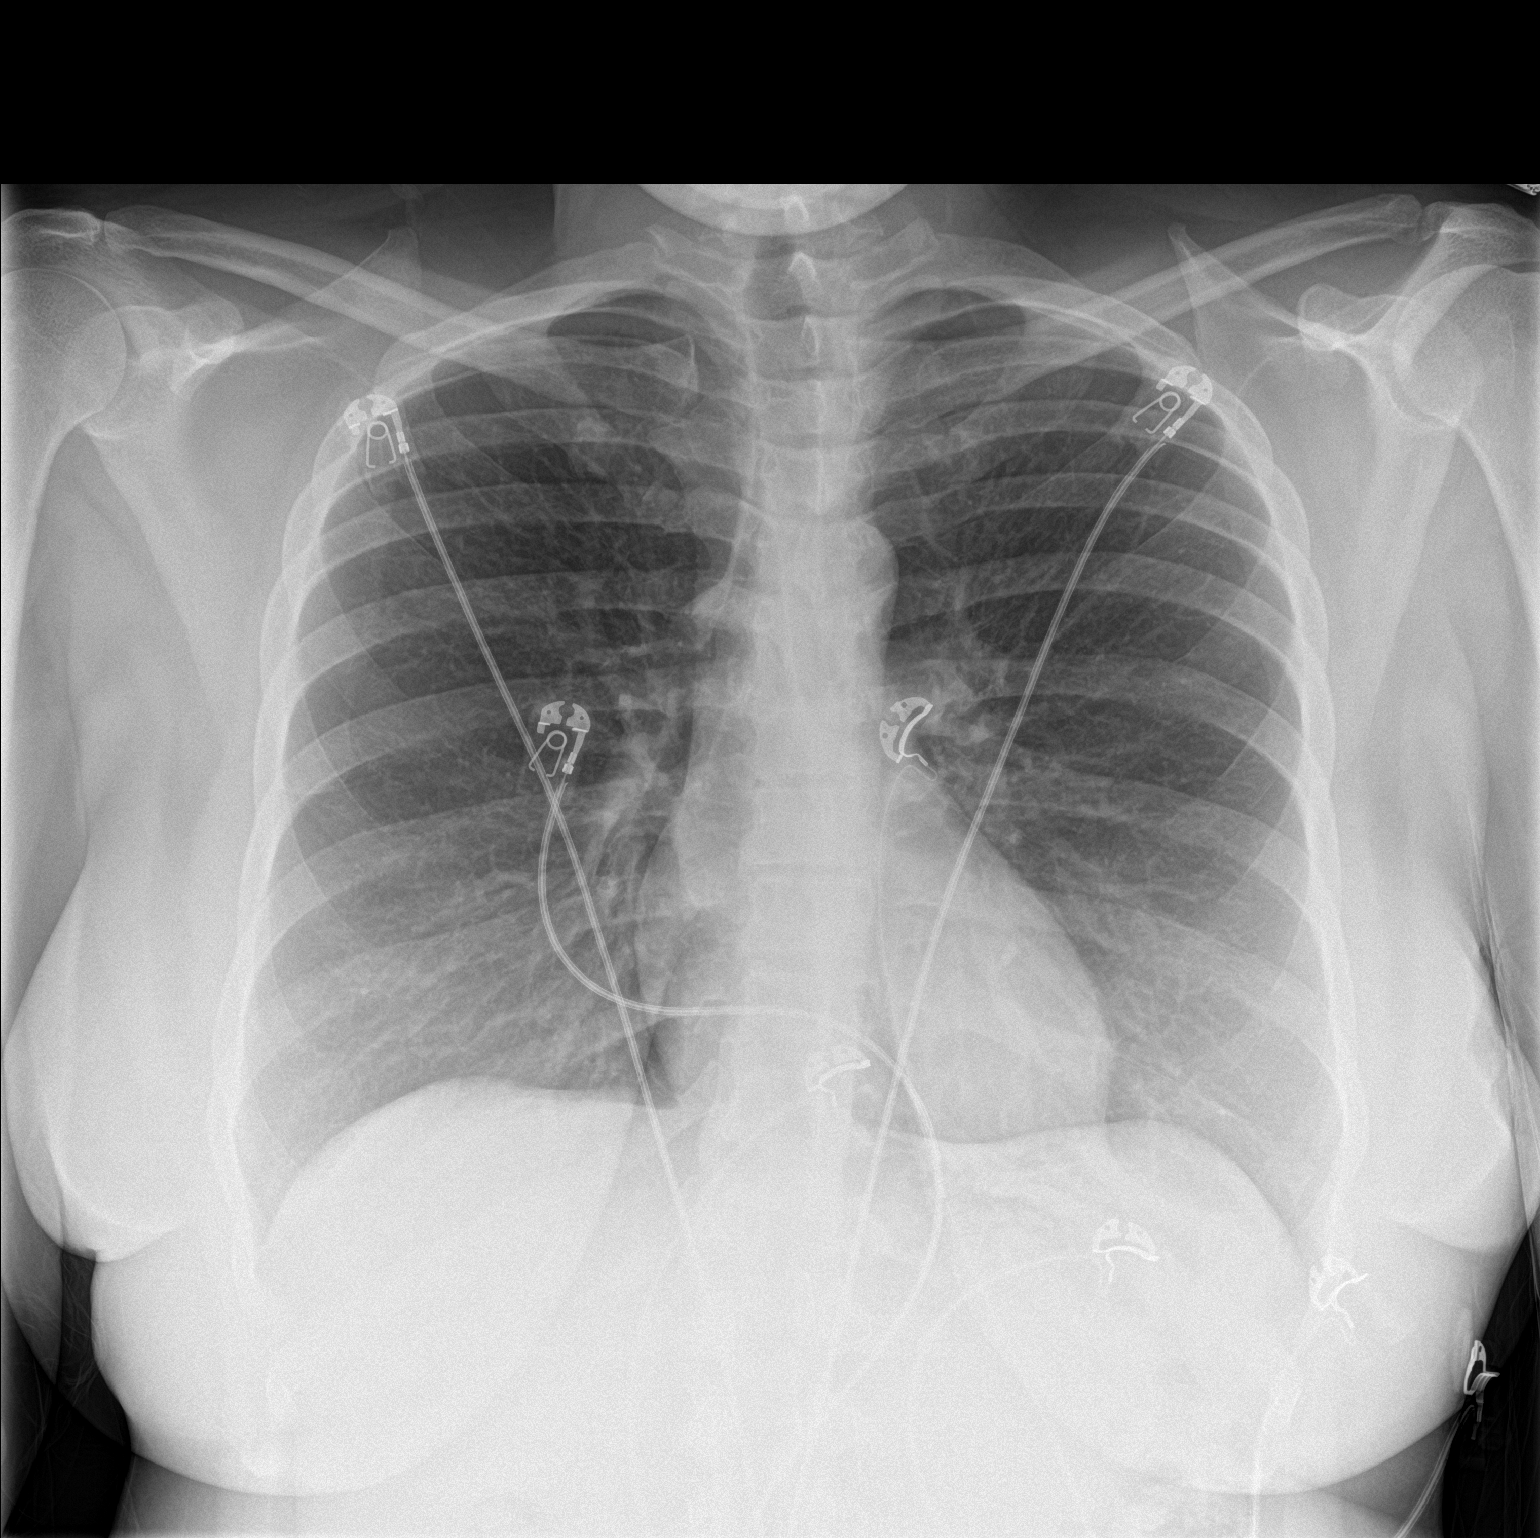

[chest lat]
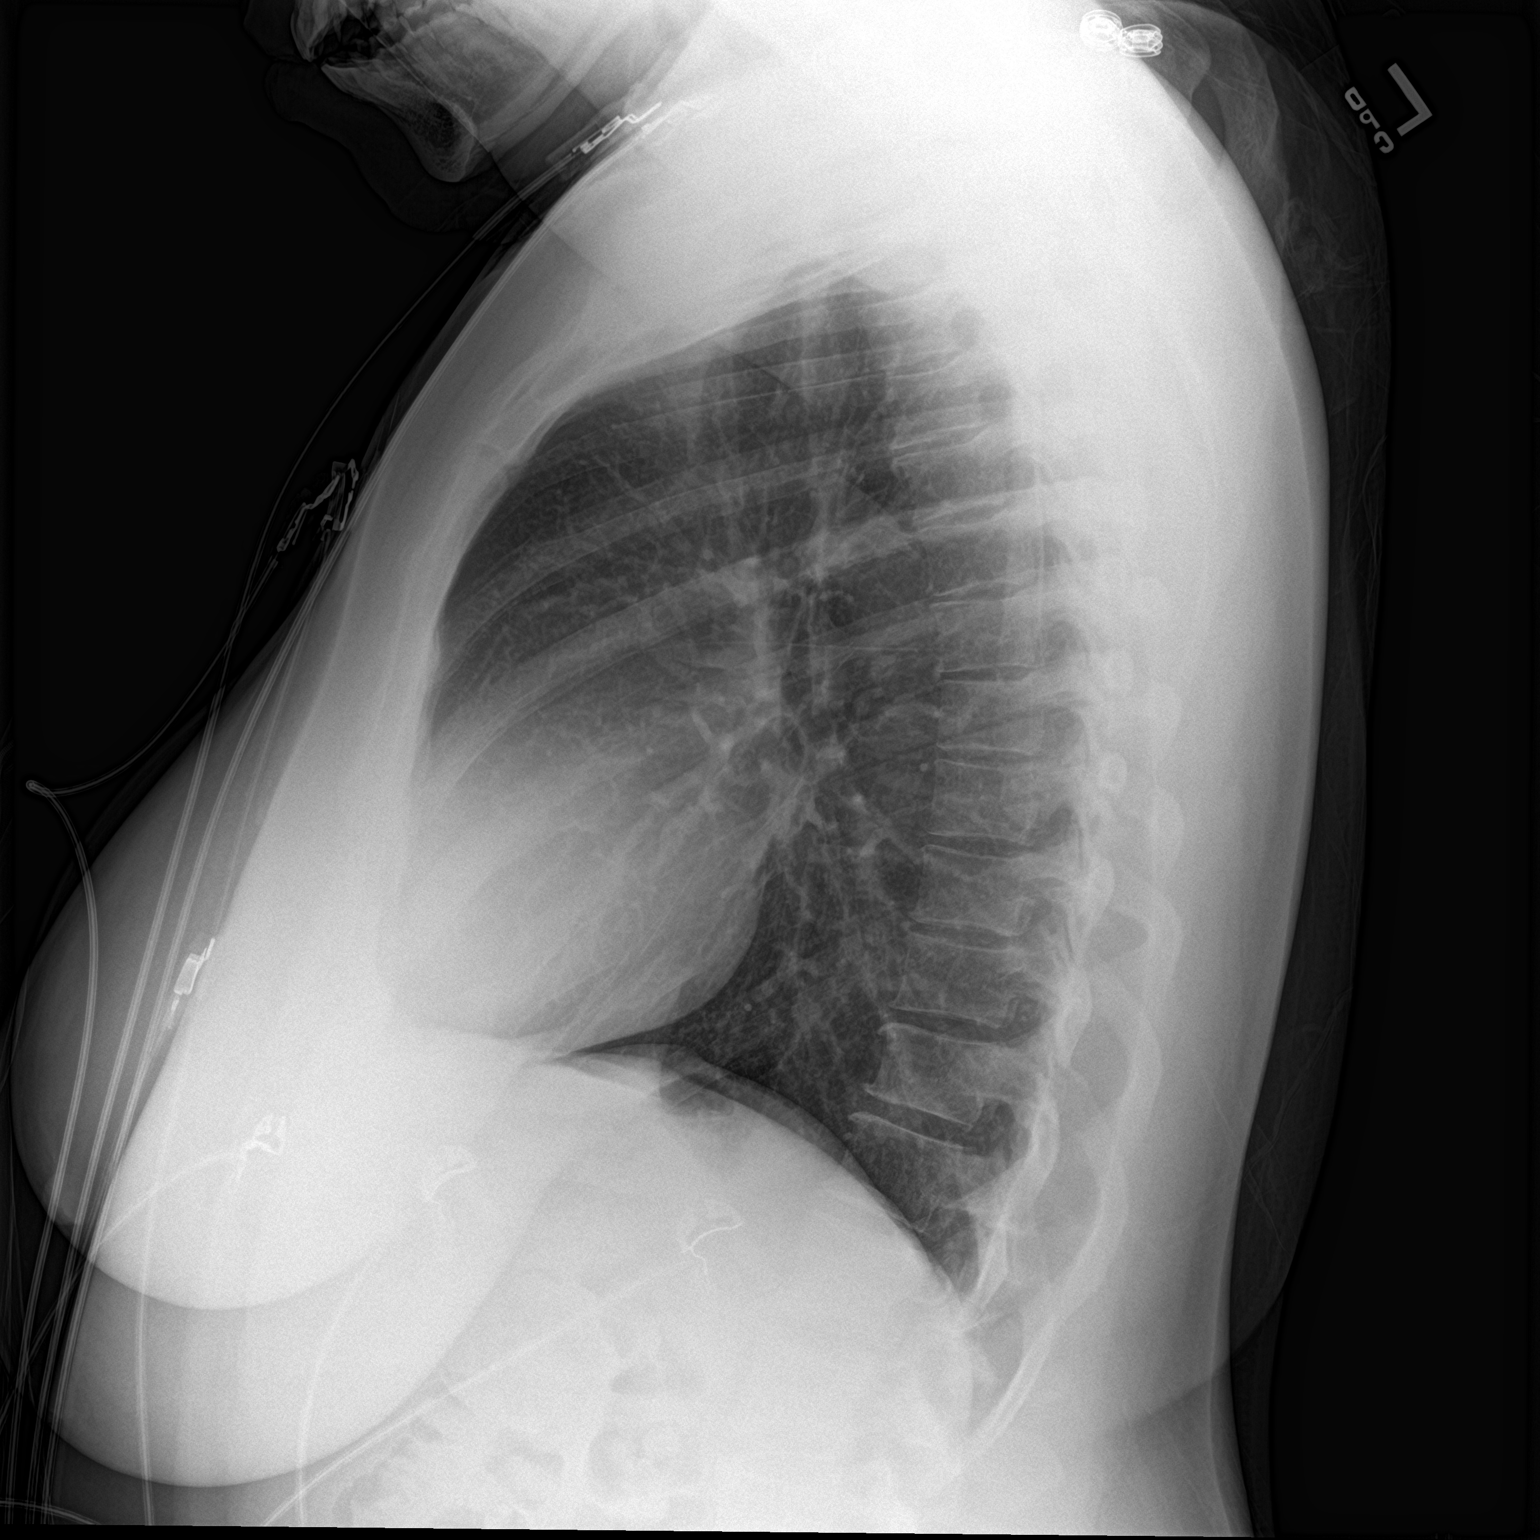

[2 of 2 positions shown; findings below may reference images not displayed]

FINDINGS: The heart size and mediastinal contours are within normal limits.
Both lungs are clear. The visualized skeletal structures are
unremarkable.
IMPRESSION: No active cardiopulmonary disease.

## 2016-03-30 NOTE — ED Triage Notes (Addendum)
Pt c/o sharp left sided chest pain that started x 3 days ago; pt states she feels weak; pt states she has also been passing some blood clots from her rectum

## 2016-03-31 LAB — POC OCCULT BLOOD, ED: FECAL OCCULT BLD: NEGATIVE

## 2016-03-31 MED ORDER — NAPROXEN 500 MG PO TABS: ORAL_TABLET | ORAL | 0 refills | Status: DC

## 2016-03-31 MED ORDER — KETOROLAC TROMETHAMINE 30 MG/ML IJ SOLN
30.0000 mg | Freq: Once | INTRAMUSCULAR | Status: AC
Start: 2016-03-31 — End: 2016-03-31
  Administered 2016-03-31: 30 mg via INTRAVENOUS
  Filled 2016-03-31: qty 1

## 2016-03-31 MED ORDER — HYDROCORTISONE ACETATE 25 MG RE SUPP: 25.0000 mg | Freq: Two times a day (BID) | RECTAL | 0 refills | Status: DC

## 2016-03-31 MED ORDER — CYCLOBENZAPRINE HCL 5 MG PO TABS: 5.0000 mg | ORAL_TABLET | Freq: Three times a day (TID) | ORAL | 0 refills | Status: DC | PRN

## 2016-03-31 MED ORDER — METHOCARBAMOL 500 MG PO TABS
1000.0000 mg | ORAL_TABLET | Freq: Once | ORAL | Status: AC
  Administered 2016-03-31: 1000 mg via ORAL
  Filled 2016-03-31: qty 2

## 2016-03-31 NOTE — ED Provider Notes (Signed)
[facility] DEPT Provider Note   CSN: [id number] Arrival date & time: 03/30/16  2305   By signing my name below, I, Hilbert Odor, attest that this documentation has been prepared under the direction and in the presence of Rolland Porter, MD. Electronically Signed: Hilbert Odor, Scribe. 03/31/16. 1:25 AM.  Time seen 12:10 AM  History   Chief Complaint Chief Complaint  Patient presents with  . Chest Pain   The history is provided by the patient. No language interpreter was used.  HPI Comments: Meghan Carter is a 36 y.o. female brought in by ambulance, who presents to the Emergency Department complaining of sharp left upper chest pain that began 3 days ago. She states before today the pain was intermittent and only lasted only a few minutes at a time. Today the pain became constant since 5 pm.She states the pain radiates into her left shoulder and she feels like there is "cold movement" behind her sternum and in her left shoulder. She states that she has been taking one aleve a day for the past 3 days with no significant relief. She states that she has never had this problem in the past. She reports Diaphoresis, nausea, light headedness, leg weakness, shakes, feeling that something is moving "like slime" on the top of her head and headache. She states that she has been passing blood clots daily for the past week. She also reports some lower abdominal pain. She has been straining when having BM recently, but denies constipation. She reports blood in the toilet and on the tissue. She has had bleeding like this in the past and it had to do with her hemorrhoids. She saw the doctor at the [facility] 3 days ago. She was treated for vaginal bacterial infection at this time. She states they are going to refer her to a gastroenterologist. She reports hx of Hemorrhoid banding x 2. She smokes 4 or 5 cigarettes a day. She states that she quit drinking 5 days ago. She  states she was drinking 1-2 shots today but could be more. She denies SOB, vomiting, rectal swelling, dizziness. 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 She states she wants to go to law school.  [facility] Department   Past Medical History:  Diagnosis Date  . Endometrial polyp 02/11/2014  . Fecal occult blood test positive 01/27/2014  . History of abnormal cervical Pap smear 02/11/2014  . HSV (herpes simplex virus) infection   . Hydrosalpinx   . Menorrhagia with regular cycle 01/27/2014  . Pelvic pain in female 01/27/2014  . Rectal bleeding 01/27/2014  . Vaginal Pap smear, abnormal     Patient Active Problem List   Diagnosis Date Noted  . Acute blood loss anemia 03/26/2015  . Severe anemia   . Lower abdominal pain   . Weakness   . Other fatigue   . Rectal pain 03/19/2014  . 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 Endometrial polyp 02/11/2014  . History of abnormal cervical Pap smear 02/11/2014  . Pelvic pain in female 01/27/2014  . Menorrhagia with regular cycle 01/27/2014  . Rectal bleeding 01/27/2014  . Fecal occult blood test positive 01/27/2014    Past Surgical History:  Procedure Laterality Date  . COLONOSCOPY N/A 03/23/2014   SLF: 1. The examined terminal ileum appeared to be normal. 2. The left colon is  redundant 3. Moderate sized internal hemorrhoids  . ECTOPIC PREGNANCY SURGERY Left 2005  . FLEXIBLE SIGMOIDOSCOPY N/A 04/01/2015   SLF: rectal bleeding due to large internal hemorrhoids 2. anemia due to rectal bleeding/memses/ poor iron intake.  Marland Kitchen FLEXIBLE SIGMOIDOSCOPY N/A 03/27/2015   Procedure: FLEXIBLE SIGMOIDOSCOPY;  Surgeon: Danie Binder, MD;  [facility];  Service: Endoscopy;  Laterality: N/A;  . HEMORRHOID BANDING N/A 03/23/2014   Procedure: HEMORRHOID BANDING;  Surgeon: Danie Binder, MD;  [facility];  Service: Endoscopy;  Laterality: N/A;  . HEMORRHOID BANDING  03/27/2015   Procedure: HEMORRHOID BANDING;  Surgeon: Danie Binder, MD;  [facility];  Service: Endoscopy;;  . HYSTEROSCOPY W/D&C N/A 06/16/2014   Procedure: DILATATION AND CURETTAGE Pollyann Glen;  Surgeon: Jonnie Kind, MD;  Location: [facility];  Service: Gynecology;  Laterality: N/A;  . LAPAROSCOPIC UNILATERAL SALPINGECTOMY Right 06/16/2014   Procedure: LAPAROSCOPIC UNILATERAL SALPINGECTOMY;  Surgeon: Jonnie Kind, MD;  Location: [facility];  Service: Gynecology;  Laterality: Right;  . POLYPECTOMY N/A 06/16/2014   Procedure: POLYPECTOMY (endometrial);  Surgeon: Jonnie Kind, MD;  Location: [facility];  Service: Gynecology;  Laterality: N/A;    OB History    Gravida Para Term Preterm AB Living   1       1     SAB TAB Ectopic Multiple Live Births       1           Home Medications    Prior to Admission medications   Medication Sig Start Date End Date Taking? Authorizing Provider  clindamycin (CLEOCIN T) 1 % lotion Apply topically 2 (two) times daily.   Yes Historical Provider, MD  METRONIDAZOLE PO Take by mouth 2 (two) times daily.   Yes Historical Provider, MD  cyclobenzaprine (FLEXERIL) 5 MG tablet Take 1 tablet (5 mg total) by mouth 3 (three) times daily as needed. 03/31/16   Rolland Porter, MD  hydrocortisone (ANUSOL-HC) 25 MG suppository Place 1 suppository (25 mg total) rectally 2 (two) times daily. 03/31/16   Rolland Porter, MD  naproxen (NAPROSYN) 500 MG tablet Take 1 po BID with food prn pain 03/31/16   Rolland Porter, MD    Family History Family History  Problem Relation Age of Onset  . Diabetes Father   . Diabetes Paternal Grandmother   . Diabetes Paternal Grandfather   . Colon cancer Neg Hx   . Inflammatory bowel disease Neg Hx     Social History Social History  Substance Use Topics  . Smoking status: Current Some Day Smoker    Packs/day: 0.25    Years: 22.00    Types: Cigarettes  . Smokeless  tobacco: Never Used  . Alcohol use Yes     Comment: daily at present  employed [employer] 5 cigs a day   Allergies   Patient has no known allergies.   Review of Systems Review of Systems  Cardiovascular: Positive for chest pain.  Gastrointestinal: Positive for abdominal pain and blood in stool.  Neurological: Positive for tremors, weakness, light-headedness and headaches.  All other systems reviewed and are negative.    Physical Exam Updated Vital Signs BP 107/62   Pulse 89   Temp 99 F (37.2 C) (Oral)   Resp 15   Ht 5\' 3"  (1.6 m)   Wt 172 lb (78 kg)   LMP 03/14/2016   SpO2 100%   BMI 30.47 kg/m   Vital signs normal    Physical  Exam  Constitutional: She is oriented to person, place, and time. She appears well-developed and well-nourished.  Non-toxic appearance. She does not appear ill. No distress.  HENT:  Head: Normocephalic and atraumatic.  Right Ear: External ear normal.  Left Ear: External ear normal.  Nose: Nose normal. No mucosal edema or rhinorrhea.  Mouth/Throat: Oropharynx is clear and moist and mucous membranes are normal. No dental abscesses or uvula swelling.  Eyes: Conjunctivae and EOM are normal. Pupils are equal, round, and reactive to light.  Neck: Normal range of motion and full passive range of motion without pain. Neck supple.  Cardiovascular: Normal rate, regular rhythm and normal heart sounds.  Exam reveals no gallop and no friction rub.   No murmur heard. Chest is extremely tender to light touch.  Pulmonary/Chest: Effort normal and breath sounds normal. No respiratory distress. She has no wheezes. She has no rhonchi. She has no rales. She exhibits no tenderness and no crepitus.    Abdominal: Soft. Normal appearance and bowel sounds are normal. She exhibits no distension. There is no tenderness. There is no rebound and no guarding.  Musculoskeletal: Normal range of motion. She exhibits no edema or tenderness.  Moves all  extremities well.   Neurological: She is alert and oriented to person, place, and time. She has normal strength. No cranial nerve deficit.  No tremors.  Skin: Skin is warm, dry and intact. No rash noted. No erythema. No pallor.  Psychiatric: She has a normal mood and affect. Her speech is normal and behavior is normal. Her mood appears not anxious.  Nursing note and vitals reviewed.    ED Treatments / Results  DIAGNOSTIC STUDIES: Oxygen Saturation is 100% on RA, normal by my interpretation.      Labs (all labs ordered are listed, but only abnormal results are displayed) Results for orders placed or performed during the hospital encounter of [id number]  Basic metabolic panel  Result Value Ref Range   Sodium 137 135 - 145 mmol/L   Potassium 3.7 3.5 - 5.1 mmol/L   Chloride 105 101 - 111 mmol/L   CO2 26 22 - 32 mmol/L   Glucose, Bld 98 65 - 99 mg/dL   BUN 14 6 - 20 mg/dL   Creatinine, Ser 0.78 0.44 - 1.00 mg/dL   Calcium 9.4 8.9 - 10.3 mg/dL   GFR calc non Af Amer >60 >60 mL/min   GFR calc Af Amer >60 >60 mL/min   Anion gap 6 5 - 15  CBC  Result Value Ref Range   WBC 5.9 4.0 - 10.5 K/uL   RBC 4.45 3.87 - 5.11 MIL/uL   Hemoglobin 12.9 12.0 - 15.0 g/dL   HCT 37.9 36.0 - 46.0 %   MCV 85.2 78.0 - 100.0 fL   MCH 29.0 26.0 - 34.0 pg   MCHC 34.0 30.0 - 36.0 g/dL   RDW 13.3 11.5 - 15.5 %   Platelets 246 150 - 400 K/uL  I-stat troponin, ED  Result Value Ref Range   Troponin i, poc 0.00 0.00 - 0.08 ng/mL   Comment 3          POC occult blood, ED RN will collect  Result Value Ref Range   Fecal Occult Bld NEGATIVE NEGATIVE   Laboratory interpretation all normal     EKG  EKG Interpretation  Date/Time:  Thursday March 30 2016 23:25:42 EST Ventricular Rate:  79 PR Interval:    QRS Duration: 71 QT Interval:  344 QTC Calculation: 395 R  Axis:   77 Text Interpretation:  Sinus arrhythmia Possible Left atrial enlargement No significant change since last tracing 10 Jun 2014  Confirmed by Dirck Butch  MD-I, Jadarian Mckay ([id number]) on 03/30/2016 11:57:50 PM       Radiology Dg Chest 2 View  Result Date: 03/31/2016 CLINICAL DATA:  Left-sided chest pain for 3 days EXAM: CHEST  2 VIEW COMPARISON:  Chest radiograph 06/08/2015 FINDINGS: The heart size and mediastinal contours are within normal limits. Both lungs are clear. The visualized skeletal structures are unremarkable. IMPRESSION: No active cardiopulmonary disease. Electronically Signed   By: Ulyses Jarred M.D.   On: 03/31/2016 00:11    Procedures Procedures (including critical care time)  Medications Ordered in ED Medications  ketorolac (TORADOL) 30 MG/ML injection 30 mg (30 mg Intravenous Given 03/31/16 0107)  methocarbamol (ROBAXIN) tablet 1,000 mg (1,000 mg Oral Given 03/31/16 0107)     Initial Impression / Assessment and Plan / ED Course  I have reviewed the triage vital signs and the nursing notes.  Pertinent labs & imaging results that were available during my care of the patient were reviewed by me and considered in my medical decision making (see chart for details).  COORDINATION OF CARE: 12:21 AM Discussed treatment plan with pt at bedside, which includes labs, and pt agreed to plan.  She was given Toradol and Robaxin for her chest wall pain.  Review of her chart shows she had internal hemorrhoids. Her Hemoccult tonight is negative. Her CIWA score was 4.  Recheck at discharge patient states her pain is improved, her headache is gone. We discussed her Hemoccult. She is artery in the process of getting [facility] card so she can be referred to GI by the health department. She's advised to follow through with that. She was given anti-inflammatory muscle relaxer for chest wall pain, she was given Anusol suppositories for her flareup of her internal hemorrhoids. She should follow through with the GI referral.   Final Clinical Impressions(s) / ED Diagnoses   Final diagnoses:  Chest wall pain  Rectal  bleeding  Alcohol withdrawal syndrome without complication (HCC)    New Prescriptions New Prescriptions   CYCLOBENZAPRINE (FLEXERIL) 5 MG TABLET    Take 1 tablet (5 mg total) by mouth 3 (three) times daily as needed.   HYDROCORTISONE (ANUSOL-HC) 25 MG SUPPOSITORY    Place 1 suppository (25 mg total) rectally 2 (two) times daily.   NAPROXEN (NAPROSYN) 500 MG TABLET    Take 1 po BID with food prn pain   Plan discharge  Rolland Porter, MD, FACEP  I personally performed the services described in this documentation, which was scribed in my presence. The recorded information has been reviewed and considered.  Rolland Porter, MD, Barbette Or, MD 03/31/16 [phone]

## 2016-03-31 NOTE — Discharge Instructions (Signed)
Take the naproxen and flexeril for your chest wall pain. Use ice and heat for comfort. Use the rectal suppositories to help with your hemorrhoids. Follow through with the health department to get referred back to Dr Oneida Alar to evaluate your hemorrhoids.

## 2016-07-25 ENCOUNTER — Encounter (HOSPITAL_COMMUNITY): Payer: Self-pay | Admitting: Emergency Medicine

## 2016-07-25 ENCOUNTER — Emergency Department (HOSPITAL_COMMUNITY)
Admission: EM | Admit: 2016-07-25 | Discharge: 2016-07-25 | Disposition: A | Discharge status: Home / Self Care | Payer: PRIVATE HEALTH INSURANCE | Attending: Emergency Medicine | Admitting: Emergency Medicine

## 2016-07-25 DIAGNOSIS — F17210 Nicotine dependence, cigarettes, uncomplicated: Secondary | ICD-10-CM | POA: Insufficient documentation

## 2016-07-25 DIAGNOSIS — N898 Other specified noninflammatory disorders of vagina: Secondary | ICD-10-CM | POA: Insufficient documentation

## 2016-07-25 LAB — WET PREP, GENITAL
CLUE CELLS WET PREP: NONE SEEN
Sperm: NONE SEEN
TRICH WET PREP: NONE SEEN
Yeast Wet Prep HPF POC: NONE SEEN

## 2016-07-25 LAB — URINALYSIS, ROUTINE W REFLEX MICROSCOPIC
BILIRUBIN URINE: NEGATIVE
GLUCOSE, UA: NEGATIVE mg/dL
HGB URINE DIPSTICK: NEGATIVE
KETONES UR: NEGATIVE mg/dL
Leukocytes, UA: NEGATIVE
NITRITE: NEGATIVE
PH: 5 (ref 5.0–8.0)
Protein, ur: NEGATIVE mg/dL
SPECIFIC GRAVITY, URINE: 1.03 (ref 1.005–1.030)

## 2016-07-25 MED ORDER — METRONIDAZOLE 500 MG PO TABS: 500.0000 mg | ORAL_TABLET | Freq: Two times a day (BID) | ORAL | 0 refills | Status: DC

## 2016-07-25 MED ORDER — LIDOCAINE HCL (PF) 1 % IJ SOLN
INTRAMUSCULAR | Status: AC
  Filled 2016-07-25: qty 5

## 2016-07-25 MED ORDER — METRONIDAZOLE 500 MG PO TABS
500.0000 mg | ORAL_TABLET | Freq: Once | ORAL | Status: AC
  Administered 2016-07-25: 500 mg via ORAL
  Filled 2016-07-25: qty 1

## 2016-07-25 MED ORDER — DOXYCYCLINE HYCLATE 100 MG PO CAPS: 100.0000 mg | ORAL_CAPSULE | Freq: Two times a day (BID) | ORAL | 0 refills | Status: DC

## 2016-07-25 MED ORDER — CEFTRIAXONE SODIUM 250 MG IJ SOLR
250.0000 mg | Freq: Once | INTRAMUSCULAR | Status: AC
  Administered 2016-07-25: 250 mg via INTRAMUSCULAR
  Filled 2016-07-25: qty 250

## 2016-07-25 MED ORDER — DOXYCYCLINE HYCLATE 100 MG PO TABS
100.0000 mg | ORAL_TABLET | Freq: Once | ORAL | Status: AC
  Administered 2016-07-25: 100 mg via ORAL
  Filled 2016-07-25: qty 1

## 2016-07-25 NOTE — ED Triage Notes (Signed)
Pt reports vaginal discharge,dysuria, lower abd pain since intercourse on 07/22/16.

## 2016-07-26 LAB — GC/CHLAMYDIA PROBE AMP (~~LOC~~) NOT AT ARMC
CHLAMYDIA, DNA PROBE: NEGATIVE
Neisseria Gonorrhea: NEGATIVE

## 2016-07-26 LAB — RPR: RPR Ser Ql: NONREACTIVE

## 2016-07-26 LAB — HIV ANTIBODY (ROUTINE TESTING W REFLEX): HIV Screen 4th Generation wRfx: NONREACTIVE

## 2016-07-26 NOTE — ED Provider Notes (Signed)
[facility] DEPT Provider Note   CSN: [id number] Arrival date & time: 07/25/16  1530     History   Chief Complaint Chief Complaint  Patient presents with  . Vaginal Discharge    HPI Meghan Carter is a 36 y.o. female.   Vaginal Discharge   This is a new problem. The current episode started 2 days ago. The problem occurs constantly. The problem has been gradually worsening. The discharge occurs after intercourse. The discharge was white, milky and thick. She is not pregnant. She has not missed her period.    Past Medical History:  Diagnosis Date  . Endometrial polyp 02/11/2014  . Fecal occult blood test positive 01/27/2014  . History of abnormal cervical Pap smear 02/11/2014  . HSV (herpes simplex virus) infection   . Hydrosalpinx   . Menorrhagia with regular cycle 01/27/2014  . Pelvic pain in female 01/27/2014  . Rectal bleeding 01/27/2014  . Vaginal Pap smear, abnormal     Patient Active Problem List   Diagnosis Date Noted  . Acute blood loss anemia 03/26/2015  . Severe anemia   . Lower abdominal pain   . Weakness   . Other fatigue   . Rectal pain 03/19/2014  . 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 Endometrial polyp 02/11/2014  . History of abnormal cervical Pap smear 02/11/2014  . Pelvic pain in female 01/27/2014  . Menorrhagia with regular cycle 01/27/2014  . Rectal bleeding 01/27/2014  . Fecal occult blood test positive 01/27/2014    Past Surgical History:  Procedure Laterality Date  . COLONOSCOPY N/A 03/23/2014   SLF: 1. The examined terminal ileum appeared to be normal. 2. The left colon is redundant 3. Moderate sized internal hemorrhoids  . ECTOPIC PREGNANCY SURGERY Left 2005  . FLEXIBLE SIGMOIDOSCOPY N/A 04/01/2015   SLF: rectal bleeding due to large internal hemorrhoids 2. anemia due to rectal bleeding/memses/ poor iron intake.  Marland Kitchen FLEXIBLE SIGMOIDOSCOPY N/A 03/27/2015   Procedure: FLEXIBLE SIGMOIDOSCOPY;  Surgeon: Danie Binder, MD;  [facility];  Service: Endoscopy;  Laterality: N/A;  . HEMORRHOID BANDING N/A 03/23/2014   Procedure: HEMORRHOID BANDING;  Surgeon: Danie Binder, MD;  Location: [facility];  Service: Endoscopy;  Laterality: N/A;  . HEMORRHOID BANDING  03/27/2015   Procedure: HEMORRHOID BANDING;  Surgeon: Danie Binder, MD;  Location: [facility];  Service: Endoscopy;;  . HYSTEROSCOPY W/D&C N/A 06/16/2014   Procedure: DILATATION AND CURETTAGE Pollyann Glen;  Surgeon: Jonnie Kind, MD;  Location: [facility];  Service: Gynecology;  Laterality: N/A;  . LAPAROSCOPIC UNILATERAL SALPINGECTOMY Right 06/16/2014   Procedure: LAPAROSCOPIC UNILATERAL SALPINGECTOMY;  Surgeon: Jonnie Kind, MD;  Location: [facility];  Service: Gynecology;  Laterality: Right;  . POLYPECTOMY N/A 06/16/2014   Procedure: POLYPECTOMY (endometrial);  Surgeon: Jonnie Kind, MD;  Location: [facility];  Service: Gynecology;  Laterality: N/A;    OB History    Gravida Para Term Preterm AB Living   1       1     SAB TAB Ectopic Multiple Live Births       1           Home Medications    Prior to Admission medications   Medication Sig Start Date End Date Taking? Authorizing Provider  doxycycline (VIBRAMYCIN) 100 MG capsule Take 1 capsule (100 mg total) by mouth 2 (two) times daily. One po bid x 14 days 07/25/16   Virlee Stroschein, Corene Cornea, MD  metroNIDAZOLE (FLAGYL) 500 MG tablet Take 1 tablet (500  mg total) by mouth 2 (two) times daily. One po bid x 14 days 07/25/16   Kendan Cornforth, Corene Cornea, MD    Family History Family History  Problem Relation Age of Onset  . Diabetes Father   . Diabetes Paternal Grandmother   . Diabetes Paternal Grandfather   . Colon cancer Neg Hx   . Inflammatory bowel disease Neg Hx     Social History Social History  Substance Use Topics  . Smoking status: Light Tobacco Smoker    Packs/day: 0.25    Years: 22.00    Types: Cigarettes  . Smokeless tobacco: Never Used  . Alcohol use Yes     Comment: daily at present     Allergies     Patient has no known allergies.   Review of Systems Review of Systems  Genitourinary: Positive for vaginal discharge.  All other systems reviewed and are negative.    Physical Exam Updated Vital Signs BP 128/84 (BP Location: Right Arm)   Pulse 83   Temp 98.2 F (36.8 C) (Oral)   Resp 18   Ht 5\' 4"  (1.626 m)   Wt 81.6 kg (180 lb)   LMP 07/11/2016   SpO2 98%   BMI 30.90 kg/m   Physical Exam  Constitutional: She is oriented to person, place, and time. She appears well-developed and well-nourished.  HENT:  Head: Normocephalic and atraumatic.  Eyes: Conjunctivae and EOM are normal.  Neck: Normal range of motion.  Cardiovascular: Normal rate and regular rhythm.   Pulmonary/Chest: Effort normal and breath sounds normal. No stridor. No respiratory distress.  Abdominal: Soft. She exhibits no distension.  Musculoskeletal: Normal range of motion. She exhibits no edema or deformity.  Neurological: She is alert and oriented to person, place, and time. No cranial nerve deficit. Coordination normal.  Skin: Skin is warm and dry. No erythema. No pallor.  Nursing note and vitals reviewed.    ED Treatments / Results  Labs (all labs ordered are listed, but only abnormal results are displayed) Labs Reviewed  WET PREP, GENITAL - Abnormal; Notable for the following:       Result Value   WBC, Wet Prep HPF POC MODERATE (*)    All other components within normal limits  URINALYSIS, ROUTINE W REFLEX MICROSCOPIC - Abnormal; Notable for the following:    APPearance HAZY (*)    All other components within normal limits  RPR  HIV ANTIBODY (ROUTINE TESTING)  GC/CHLAMYDIA PROBE AMP ([facility]) NOT AT [facility]    EKG  EKG Interpretation None       Radiology No results found.  Procedures Procedures (including critical care time)  Medications Ordered in ED Medications  cefTRIAXone (ROCEPHIN) injection 250 mg (250 mg Intramuscular Given 07/25/16 1718)  doxycycline (VIBRA-TABS) tablet  100 mg (100 mg Oral Given 07/25/16 1716)  metroNIDAZOLE (FLAGYL) tablet 500 mg (500 mg Oral Given 07/25/16 1716)     Initial Impression / Assessment and Plan / ED Course  I have reviewed the triage vital signs and the nursing notes.  Pertinent labs & imaging results that were available during my care of the patient were reviewed by me and considered in my medical decision making (see chart for details).     Suspect mild PID. Will treat for same.   Final Clinical Impressions(s) / ED Diagnoses   Final diagnoses:  Vaginal discharge    New Prescriptions Discharge Medication List as of 07/25/2016  4:53 PM    START taking these medications   Details  doxycycline (  VIBRAMYCIN) 100 MG capsule Take 1 capsule (100 mg total) by mouth 2 (two) times daily. One po bid x 14 days, Starting Tue 07/25/2016, Print    metroNIDAZOLE (FLAGYL) 500 MG tablet Take 1 tablet (500 mg total) by mouth 2 (two) times daily. One po bid x 14 days, Starting Tue 07/25/2016, Print         Asami Lambright, Corene Cornea, MD 07/26/16 [phone]

## 2016-11-29 ENCOUNTER — Emergency Department (HOSPITAL_COMMUNITY)
Admission: EM | Admit: 2016-11-29 | Discharge: 2016-11-29 | Disposition: A | Discharge status: Home / Self Care | Payer: PRIVATE HEALTH INSURANCE | Attending: Emergency Medicine | Admitting: Emergency Medicine

## 2016-11-29 ENCOUNTER — Encounter (HOSPITAL_COMMUNITY): Payer: Self-pay | Admitting: *Deleted

## 2016-11-29 DIAGNOSIS — R61 Generalized hyperhidrosis: Secondary | ICD-10-CM | POA: Insufficient documentation

## 2016-11-29 DIAGNOSIS — R109 Unspecified abdominal pain: Secondary | ICD-10-CM | POA: Insufficient documentation

## 2016-11-29 DIAGNOSIS — R51 Headache: Secondary | ICD-10-CM | POA: Insufficient documentation

## 2016-11-29 DIAGNOSIS — K5900 Constipation, unspecified: Secondary | ICD-10-CM | POA: Insufficient documentation

## 2016-11-29 DIAGNOSIS — R6883 Chills (without fever): Secondary | ICD-10-CM | POA: Diagnosis not present

## 2016-11-29 DIAGNOSIS — K649 Unspecified hemorrhoids: Secondary | ICD-10-CM | POA: Diagnosis not present

## 2016-11-29 DIAGNOSIS — F17210 Nicotine dependence, cigarettes, uncomplicated: Secondary | ICD-10-CM | POA: Diagnosis not present

## 2016-11-29 DIAGNOSIS — K625 Hemorrhage of anus and rectum: Secondary | ICD-10-CM | POA: Diagnosis present

## 2016-11-29 HISTORY — DX: Encounter for other specified aftercare: Z51.89

## 2016-11-29 LAB — CBC
HEMATOCRIT: 37.6 % (ref 36.0–46.0)
Hemoglobin: 12.1 g/dL (ref 12.0–15.0)
MCH: 27.2 pg (ref 26.0–34.0)
MCHC: 32.2 g/dL (ref 30.0–36.0)
MCV: 84.5 fL (ref 78.0–100.0)
Platelets: 266 10*3/uL (ref 150–400)
RBC: 4.45 MIL/uL (ref 3.87–5.11)
RDW: 14.1 % (ref 11.5–15.5)
WBC: 4.1 10*3/uL (ref 4.0–10.5)

## 2016-11-29 LAB — TYPE AND SCREEN
ABO/RH(D): A POS
ANTIBODY SCREEN: NEGATIVE

## 2016-11-29 LAB — COMPREHENSIVE METABOLIC PANEL
ALBUMIN: 3.8 g/dL (ref 3.5–5.0)
ALT: 11 U/L — AB (ref 14–54)
AST: 20 U/L (ref 15–41)
Alkaline Phosphatase: 54 U/L (ref 38–126)
Anion gap: 6 (ref 5–15)
BILIRUBIN TOTAL: 0.6 mg/dL (ref 0.3–1.2)
BUN: 9 mg/dL (ref 6–20)
CHLORIDE: 108 mmol/L (ref 101–111)
CO2: 23 mmol/L (ref 22–32)
CREATININE: 0.7 mg/dL (ref 0.44–1.00)
Calcium: 9 mg/dL (ref 8.9–10.3)
GFR calc Af Amer: >60 mL/min (ref >60)
GFR calc non Af Amer: >60 mL/min (ref >60)
GLUCOSE: 98 mg/dL (ref 65–99)
POTASSIUM: 4 mmol/L (ref 3.5–5.1)
Sodium: 137 mmol/L (ref 135–145)
Total Protein: 6.7 g/dL (ref 6.5–8.1)

## 2016-11-29 LAB — ABO/RH: ABO/RH(D): A POS

## 2016-11-29 LAB — POC OCCULT BLOOD, ED: FECAL OCCULT BLD: POSITIVE — AB

## 2016-11-29 LAB — I-STAT BETA HCG BLOOD, ED (MC, WL, AP ONLY): I-stat hCG, quantitative: <5 m[IU]/mL (ref <5)

## 2016-11-29 MED ORDER — HYDROCORTISONE ACETATE 25 MG RE SUPP: 25.0000 mg | Freq: Two times a day (BID) | RECTAL | 0 refills | Status: DC

## 2016-11-29 MED ORDER — POLYETHYLENE GLYCOL 3350 17 G PO PACK: 17.0000 g | PACK | Freq: Every day | ORAL | 0 refills | Status: DC

## 2016-11-29 NOTE — ED Notes (Signed)
Patient up to [facility] complaining about "sitting out here for 4h". Wants to  now how much longer until she gets to a room. Explained rooms are full, several people in front of her. Patient inquired about who she can call after she goes home, to make complaint. RN will provide with appropriate contact information.

## 2016-11-29 NOTE — ED Triage Notes (Signed)
Pt states increasing rectal bleeding for several weeks to the point where the blood is "pouring out".  Also c/o lower abdominal pain.  Hx of blood transfusion for same.  Also states weakness with exertion and leg cramping.

## 2016-11-29 NOTE — ED Provider Notes (Signed)
[facility] EMERGENCY DEPARTMENT Provider Note   CSN: [id number] Arrival date & time: 11/29/16  1401     History   Chief Complaint Chief Complaint  Patient presents with  . Rectal Bleeding    HPI Meghan Carter is a 36 y.o. female.  HPI   36 year old female with history of rectal bleeding presenting for evaluation of rectal bleeding. Hx of previous rectal bleeding and hemorrhoids. For the past 3 weeks, she report increase severity of her rectal bleeding.  Also report report weakness and paresthesia to her BLE for 1 week.  Report rectal pain and abd pain which she attributed to the hemorrhoids.  STs she does not have rectal bleeding with each BM, but last episode was yesterday.  Does report headache, chills, nightsweats.  Report intermittent constipation. LBM this AM.  Report sharp intermittent CP which occurs with her bleeding.  No palpitation or difficulty breathing.  Has hx of iron deficiency anemia.  Has had hemorrhoid banding in the past.  Did required blood transfusion. Does report increase fatigue but able to carry out her normal daily activity.  Report having 2 BMs daily this is her usual.  Does report rectal discomfort.  No history of active cancer.  No history of diverticulitis or diverticulosis.  Does reports having intermittent constipation for the same duration.  She did use milk of magnesia twice.  Past Medical History:  Diagnosis Date  . Blood transfusion without reported diagnosis   . Endometrial polyp 02/11/2014  . Fecal occult blood test positive 01/27/2014  . History of abnormal cervical Pap smear 02/11/2014  . HSV (herpes simplex virus) infection   . Hydrosalpinx   . Menorrhagia with regular cycle 01/27/2014  . Pelvic pain in female 01/27/2014  . Rectal bleeding 01/27/2014  . Vaginal Pap smear, abnormal     Patient Active Problem List   Diagnosis Date Noted  . Acute blood loss anemia 03/26/2015  . Severe anemia   . Lower abdominal  pain   . Weakness   . Other fatigue   . Rectal pain 03/19/2014  . 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 Endometrial polyp 02/11/2014  . History of abnormal cervical Pap smear 02/11/2014  . Pelvic pain in female 01/27/2014  . Menorrhagia with regular cycle 01/27/2014  . Rectal bleeding 01/27/2014  . Fecal occult blood test positive 01/27/2014    Past Surgical History:  Procedure Laterality Date  . COLONOSCOPY N/A 03/23/2014   SLF: 1. The examined terminal ileum appeared to be normal. 2. The left colon is redundant 3. Moderate sized internal hemorrhoids  . ECTOPIC PREGNANCY SURGERY Left 2005  . FLEXIBLE SIGMOIDOSCOPY N/A 04/01/2015   SLF: rectal bleeding due to large internal hemorrhoids 2. anemia due to rectal bleeding/memses/ poor iron intake.  Marland Kitchen FLEXIBLE SIGMOIDOSCOPY N/A 03/27/2015   Procedure: FLEXIBLE SIGMOIDOSCOPY;  Surgeon: Danie Binder, MD;  [facility];  Service: Endoscopy;  Laterality: N/A;  . HEMORRHOID BANDING N/A 03/23/2014   Procedure: HEMORRHOID BANDING;  Surgeon: Danie Binder, MD;  [facility];  Service: Endoscopy;  Laterality: N/A;  . HEMORRHOID BANDING  03/27/2015   Procedure: HEMORRHOID BANDING;  Surgeon: Danie Binder, MD;  [facility];  Service: Endoscopy;;  . HYSTEROSCOPY W/D&C N/A 06/16/2014   Procedure: DILATATION AND CURETTAGE Pollyann Glen;  Surgeon: Jonnie Kind, MD;  Location: [facility];  Service: Gynecology;  Laterality: N/A;  . LAPAROSCOPIC UNILATERAL SALPINGECTOMY Right 06/16/2014   Procedure: LAPAROSCOPIC UNILATERAL SALPINGECTOMY;  Surgeon: Jonnie Kind, MD;  Location: [facility];  Service: Gynecology;  Laterality: Right;  . POLYPECTOMY N/A 06/16/2014   Procedure: POLYPECTOMY (endometrial);  Surgeon: Jonnie Kind, MD;  Location: [facility];  Service: Gynecology;  Laterality: N/A;    OB History    Gravida Para Term Preterm AB Living   1       1     SAB TAB Ectopic Multiple Live Births       1           Home Medications     Prior to Admission medications   Medication Sig Start Date End Date Taking? Authorizing Provider  doxycycline (VIBRAMYCIN) 100 MG capsule Take 1 capsule (100 mg total) by mouth 2 (two) times daily. One po bid x 14 days 07/25/16   Mesner, Corene Cornea, MD  metroNIDAZOLE (FLAGYL) 500 MG tablet Take 1 tablet (500 mg total) by mouth 2 (two) times daily. One po bid x 14 days 07/25/16   Mesner, Corene Cornea, MD    Family History Family History  Problem Relation Age of Onset  . Diabetes Father   . Diabetes Paternal Grandmother   . Diabetes Paternal Grandfather   . Colon cancer Neg Hx   . Inflammatory bowel disease Neg Hx     Social History Social History  Substance Use Topics  . Smoking status: Light Tobacco Smoker    Packs/day: 0.25    Years: 22.00    Types: Cigarettes  . Smokeless tobacco: Never Used  . Alcohol use Yes     Comment: daily at present     Allergies   Patient has no known allergies.   Review of Systems Review of Systems  All other systems reviewed and are negative.    Physical Exam Updated Vital Signs BP 115/75 (BP Location: Left Arm)   Pulse 71   Temp 98.2 F (36.8 C) (Oral)   Resp 12   Ht 5\' 4"  (1.626 m)   Wt 81.6 kg (180 lb)   LMP 11/23/2016   SpO2 100%   BMI 30.90 kg/m   Physical Exam  Constitutional: She appears well-developed and well-nourished. No distress.  HENT:  Head: Atraumatic.  Eyes: Conjunctivae are normal.  Neck: Neck supple.  Cardiovascular: Normal rate and regular rhythm.   Pulmonary/Chest: Effort normal and breath sounds normal.  Genitourinary:  Genitourinary Comments: Chaperone present during exam.  Nonthrombosed external hemorrhoid noted.  Evidence of internal hemorrhoid on palpation without any obvious rectal mass, no obvious stool impaction, normal color stool on glove, normal rectal tone, no anal fissure, no bright red blood.  Neurological: She is alert.  Skin: No rash noted.  Psychiatric: She has a normal mood and affect.  Nursing  note and vitals reviewed.    ED Treatments / Results  Labs (all labs ordered are listed, but only abnormal results are displayed) Labs Reviewed  COMPREHENSIVE METABOLIC PANEL - Abnormal; Notable for the following:       Result Value   ALT 11 (*)    All other components within normal limits  CBC  I-STAT BETA HCG BLOOD, ED ([facility], [facility], [facility] ONLY)  POC OCCULT BLOOD, ED  POC OCCULT BLOOD, ED  TYPE AND SCREEN  ABO/RH    EKG  EKG Interpretation None       Radiology No results found.  Procedures Procedures (including critical care time)  Medications Ordered in ED Medications - No data to display   Initial Impression / Assessment and Plan / ED Course  I have reviewed the triage vital signs  and the nursing notes.  Pertinent labs & imaging results that were available during my care of the patient were reviewed by me and considered in my medical decision making (see chart for details).     BP 106/71   Pulse 74   Temp 98.2 F (36.8 C) (Oral)   Resp 12   Ht 5\' 4"  (1.626 m)   Wt 81.6 kg (180 lb)   LMP 11/23/2016   SpO2 100%   BMI 30.90 kg/m    Final Clinical Impressions(s) / ED Diagnoses   Final diagnoses:  Bleeding hemorrhoid    New Prescriptions New Prescriptions   HYDROCORTISONE (ANUSOL-HC) 25 MG SUPPOSITORY    Place 1 suppository (25 mg total) rectally 2 (two) times daily. For 7 days   POLYETHYLENE GLYCOL (MIRALAX / GLYCOLAX) PACKET    Take 17 g by mouth daily.   8:32 PM Patient here with complaints of constipation, intermittent rectal bleeding and rectal discomfort.  She does have history of hemorrhoid requiring hemorrhoidal banding in the past.  On exam patient does have evidence of internal hemorrhoid but no evidence of thrombosed hemorrhoid, normal color stool on the glove.  Vital signs stable, hemoglobin is 12.1, electrolytes panels are reassuring.  At this time patient does not meet any criteria to be admitted.  I have low suspicion for diverticulosis or  diverticulitis causing symptoms.  Encourage patient to follow-up with her GI specialist for further care.  Will prescribe MiraLAX and Anusol for constipation and rectal discomfort.  Pt felt comfortable going home, return precaution given.     Domenic Moras, PA-C 11/29/16 2049    Deno Etienne, DO 11/29/16 2325

## 2016-11-29 NOTE — ED Notes (Signed)
Pt stable, ambulatory, and verbalizes understanding of d/c instructions.  

## 2016-11-29 NOTE — Discharge Instructions (Signed)
Please follow up with your GI specialist for further evaluation and management of your hemorrhoid.  Take Miralax to help with constipation.  Use anusol for rectal discomfort.  Return if you have any concerns.

## 2016-11-29 NOTE — ED Notes (Signed)
Patient up to [facility] asking the wait time, that she is "losing a lot of blood". Discussed wait time, reviewed H&H. Patient asked what her hemoglobin was - RN told her 12.1. Discussed with patient that levels can change and she will be roomed soon.

## 2017-03-02 ENCOUNTER — Other Ambulatory Visit: Payer: Self-pay

## 2017-03-02 ENCOUNTER — Other Ambulatory Visit (HOSPITAL_COMMUNITY)
Admission: RE | Admit: 2017-03-02 | Discharge: 2017-03-02 | Disposition: A | Discharge status: Home / Self Care | Payer: BLUE CROSS/BLUE SHIELD | Source: Ambulatory Visit | Attending: Obstetrics and Gynecology | Admitting: Obstetrics and Gynecology

## 2017-03-02 ENCOUNTER — Ambulatory Visit: Payer: BLUE CROSS/BLUE SHIELD | Admitting: Obstetrics and Gynecology

## 2017-03-02 ENCOUNTER — Encounter: Payer: Self-pay | Admitting: Obstetrics and Gynecology

## 2017-03-02 VITALS — BP 120/70 | HR 70 | Resp 16 | Ht 62.75 in | Wt 188.0 lb

## 2017-03-02 DIAGNOSIS — N941 Unspecified dyspareunia: Secondary | ICD-10-CM | POA: Diagnosis not present

## 2017-03-02 DIAGNOSIS — N898 Other specified noninflammatory disorders of vagina: Secondary | ICD-10-CM

## 2017-03-02 DIAGNOSIS — Z113 Encounter for screening for infections with a predominantly sexual mode of transmission: Secondary | ICD-10-CM

## 2017-03-02 DIAGNOSIS — K625 Hemorrhage of anus and rectum: Secondary | ICD-10-CM

## 2017-03-02 DIAGNOSIS — Z124 Encounter for screening for malignant neoplasm of cervix: Secondary | ICD-10-CM | POA: Diagnosis not present

## 2017-03-02 DIAGNOSIS — N949 Unspecified condition associated with female genital organs and menstrual cycle: Secondary | ICD-10-CM

## 2017-03-02 DIAGNOSIS — N946 Dysmenorrhea, unspecified: Secondary | ICD-10-CM

## 2017-03-02 DIAGNOSIS — Z01419 Encounter for gynecological examination (general) (routine) without abnormal findings: Secondary | ICD-10-CM

## 2017-03-02 DIAGNOSIS — N939 Abnormal uterine and vaginal bleeding, unspecified: Secondary | ICD-10-CM | POA: Diagnosis not present

## 2017-03-02 DIAGNOSIS — Z8719 Personal history of other diseases of the digestive system: Secondary | ICD-10-CM | POA: Diagnosis not present

## 2017-03-02 MED ORDER — DOXYCYCLINE HYCLATE 100 MG PO CAPS: 100.0000 mg | ORAL_CAPSULE | Freq: Two times a day (BID) | ORAL | 0 refills | Status: DC

## 2017-03-02 MED ORDER — IBUPROFEN 800 MG PO TABS: 800.0000 mg | ORAL_TABLET | Freq: Three times a day (TID) | ORAL | 1 refills | Status: DC | PRN

## 2017-03-02 NOTE — Patient Instructions (Signed)
EXERCISE AND DIET:  We recommended that you start or continue a regular exercise program for good health. Regular exercise means any activity that makes your heart beat faster and makes you sweat.  We recommend exercising at least 30 minutes per day at least 3 days a week, preferably 4 or 5.  We also recommend a diet low in fat and sugar.  Inactivity, poor dietary choices and obesity can cause diabetes, heart attack, stroke, and kidney damage, among others.    ALCOHOL AND SMOKING:  Women should limit their alcohol intake to no more than 7 drinks/beers/glasses of wine (combined, not each!) per week. Moderation of alcohol intake to this level decreases your risk of breast cancer and liver damage. And of course, no recreational drugs are part of a healthy lifestyle.  And absolutely no smoking or even second hand smoke. Most people know smoking can cause heart and lung diseases, but did you know it also contributes to weakening of your bones? Aging of your skin?  Yellowing of your teeth and nails?  CALCIUM AND VITAMIN D:  Adequate intake of calcium and Vitamin D are recommended.  The recommendations for exact amounts of these supplements seem to change often, but generally speaking 600 mg of calcium (either carbonate or citrate) and 800 units of Vitamin D per day seems prudent. Certain women may benefit from higher intake of Vitamin D.  If you are among these women, your doctor will have told you during your visit.    PAP SMEARS:  Pap smears, to check for cervical cancer or precancers,  have traditionally been done yearly, although recent scientific advances have shown that most women can have pap smears less often.  However, every woman still should have a physical exam from her gynecologist every year. It will include a breast check, inspection of the vulva and vagina to check for abnormal growths or skin changes, a visual exam of the cervix, and then an exam to evaluate the size and shape of the uterus and  ovaries.  And after 38 years of age, a rectal exam is indicated to check for rectal cancers. We will also provide age appropriate advice regarding health maintenance, like when you should have certain vaccines, screening for sexually transmitted diseases, bone density testing, colonoscopy, mammograms, etc.   MAMMOGRAMS:  All women over 40 years old should have a yearly mammogram. Many facilities now offer a "3D" mammogram, which may cost around $50 extra out of pocket. If possible,  we recommend you accept the option to have the 3D mammogram performed.  It both reduces the number of women who will be called back for extra views which then turn out to be normal, and it is better than the routine mammogram at detecting truly abnormal areas.    COLONOSCOPY:  Colonoscopy to screen for colon cancer is recommended for all women at age 50.  We know, you hate the idea of the prep.  We agree, BUT, having colon cancer and not knowing it is worse!!  Colon cancer so often starts as a polyp that can be seen and removed at colonscopy, which can quite literally save your life!  And if your first colonoscopy is normal and you have no family history of colon cancer, most women don't have to have it again for 10 years.  Once every ten years, you can do something that may end up saving your life, right?  We will be happy to help you get it scheduled when you are ready.    Be sure to check your insurance coverage so you understand how much it will cost.  It may be covered as a preventative service at no cost, but you should check your particular policy.      Breast Self-Awareness Breast self-awareness means being familiar with how your breasts look and feel. It involves checking your breasts regularly and reporting any changes to your health care provider. Practicing breast self-awareness is important. A change in your breasts can be a sign of a serious medical problem. Being familiar with how your breasts look and feel allows  you to find any problems early, when treatment is more likely to be successful. All women should practice breast self-awareness, including women who have had breast implants. How to do a breast self-exam One way to learn what is normal for your breasts and whether your breasts are changing is to do a breast self-exam. To do a breast self-exam: Look for Changes  1. Remove all the clothing above your waist. 2. Stand in front of a mirror in a room with good lighting. 3. Put your hands on your hips. 4. Push your hands firmly downward. 5. Compare your breasts in the mirror. Look for differences between them (asymmetry), such as: ? Differences in shape. ? Differences in size. ? Puckers, dips, and bumps in one breast and not the other. 6. Look at each breast for changes in your skin, such as: ? Redness. ? Scaly areas. 7. Look for changes in your nipples, such as: ? Discharge. ? Bleeding. ? Dimpling. ? Redness. ? A change in position. Feel for Changes  Carefully feel your breasts for lumps and changes. It is best to do this while lying on your back on the floor and again while sitting or standing in the shower or tub with soapy water on your skin. Feel each breast in the following way:  Place the arm on the side of the breast you are examining above your head.  Feel your breast with the other hand.  Start in the nipple area and make  inch (2 cm) overlapping circles to feel your breast. Use the pads of your three middle fingers to do this. Apply light pressure, then medium pressure, then firm pressure. The light pressure will allow you to feel the tissue closest to the skin. The medium pressure will allow you to feel the tissue that is a little deeper. The firm pressure will allow you to feel the tissue close to the ribs.  Continue the overlapping circles, moving downward over the breast until you feel your ribs below your breast.  Move one finger-width toward the center of the body.  Continue to use the  inch (2 cm) overlapping circles to feel your breast as you move slowly up toward your collarbone.  Continue the up and down exam using all three pressures until you reach your armpit.  Write Down What You Find  Write down what is normal for each breast and any changes that you find. Keep a written record with breast changes or normal findings for each breast. By writing this information down, you do not need to depend only on memory for size, tenderness, or location. Write down where you are in your menstrual cycle, if you are still menstruating. If you are having trouble noticing differences in your breasts, do not get discouraged. With time you will become more familiar with the variations in your breasts and more comfortable with the exam. How often should I examine my breasts? Examine   your breasts every month. If you are breastfeeding, the best time to examine your breasts is after a feeding or after using a breast pump. If you menstruate, the best time to examine your breasts is 5-7 days after your period is over. During your period, your breasts are lumpier, and it may be more difficult to notice changes. When should I see my health care provider? See your health care provider if you notice:  A change in shape or size of your breasts or nipples.  A change in the skin of your breast or nipples, such as a reddened or scaly area.  Unusual discharge from your nipples.  A lump or thick area that was not there before.  Pain in your breasts.  Anything that concerns you.  This information is not intended to replace advice given to you by your health care provider. Make sure you discuss any questions you have with your health care provider. Document Released: 01/23/2005 Document Revised: 07/01/2015 Document Reviewed: 12/13/2014 [employer] Interactive Patient Education  2018 [employer]  

## 2017-03-02 NOTE — Progress Notes (Addendum)
37 y.o. G1P0010 Legally SeparatedAfrican AmericanF here for annual exam.   When she has an orgasm she has some fluid come out of her rectum. She has deep dyspareunia with intercourse. She has a sharp pain up inside of her vagina after orgasm.  She c/o vaginal d/c, brown, not liquid, every day for years.  She had a hysteroscopy and right salpingectomy.  She has been having vaginal dryness since her surgery in 2016. She had hemorrhoid banding in 2017. She is not currently sexually active, husband left because her vagina was dry. She then had a boyfriend who also left because her vagina was dry She intermittently has vaginal irritation.  Menses q 3-4 weeks x 7 days, then can linger with spotting for days. Then spots in between. She uses 2 pads a day with her cycle. Cramps are severe. Not currently sexually active.  Period Cycle (Days): 28 Period Duration (Days): 7 Period Pattern: (!) Irregular Menstrual Flow: Heavy Menstrual Control: Maxi pad Dysmenorrhea: (!) Severe Dysmenorrhea Symptoms: Cramping, Headache  Patient's last menstrual period was 02/20/2017 (exact date).          Sexually active: No.  The current method of family planning is abstinence.    Exercising: Yes.    walking at work Smoker:  2-3 cigarettes a day. Working on quitting   Health Maintenance: Pap:  1/16 abnormal had colposcopy? Unable to remember History of abnormal Pap:  yes MMG:  none Colonoscopy:  74yrs ago BMD:   none TDaP:  2019 Gardasil: none   reports that she has been smoking cigarettes.  She has a 5.50 pack-year smoking history. she has never used smokeless tobacco. She reports that she drinks about 1.2 - 1.8 oz of alcohol per week. She reports that she does not use drugs. Works in an [employer] as a Programme researcher, broadcasting/film/video. Going to school, Actor, plans to go to law school.   Past Medical History:  Diagnosis Date  . Anxiety   . Blood transfusion without reported diagnosis   . Depression   .  Dysmenorrhea   . Endometrial polyp 02/11/2014  . Fecal occult blood test positive 01/27/2014  . History of abnormal cervical Pap smear 02/11/2014  . HSV (herpes simplex virus) infection   . Hydrosalpinx   . Menorrhagia with regular cycle 01/27/2014  . Pelvic pain in female 01/27/2014  . Rectal bleeding 01/27/2014  . Vaginal Pap smear, abnormal   Not currently getting outbreaks.   Past Surgical History:  Procedure Laterality Date  . COLONOSCOPY N/A 03/23/2014   SLF: 1. The examined terminal ileum appeared to be normal. 2. The left colon is redundant 3. Moderate sized internal hemorrhoids  . ECTOPIC PREGNANCY SURGERY Left 2005  . FLEXIBLE SIGMOIDOSCOPY N/A 04/01/2015   SLF: rectal bleeding due to large internal hemorrhoids 2. anemia due to rectal bleeding/memses/ poor iron intake.  Marland Kitchen FLEXIBLE SIGMOIDOSCOPY N/A 03/27/2015   Procedure: FLEXIBLE SIGMOIDOSCOPY;  Surgeon: Danie Binder, MD;  [facility];  Service: Endoscopy;  Laterality: N/A;  . HEMORRHOID BANDING N/A 03/23/2014   Procedure: HEMORRHOID BANDING;  Surgeon: Danie Binder, MD;  [facility];  Service: Endoscopy;  Laterality: N/A;  . HEMORRHOID BANDING  03/27/2015   Procedure: HEMORRHOID BANDING;  Surgeon: Danie Binder, MD;  [facility];  Service: Endoscopy;;  . HYSTEROSCOPY W/D&C N/A 06/16/2014   Procedure: DILATATION AND CURETTAGE Pollyann Glen;  Surgeon: Jonnie Kind, MD;  Location: [facility];  Service: Gynecology;  Laterality: N/A;  .  LAPAROSCOPIC UNILATERAL SALPINGECTOMY Right 06/16/2014   Procedure: LAPAROSCOPIC UNILATERAL SALPINGECTOMY;  Surgeon: Jonnie Kind, MD;  Location: [facility];  Service: Gynecology;  Laterality: Right;  . POLYPECTOMY N/A 06/16/2014   Procedure: POLYPECTOMY (endometrial);  Surgeon: Jonnie Kind, MD;  Location: [facility];  Service: Gynecology;  Laterality: N/A;    Current Outpatient Medications  Medication Sig Dispense Refill  . UNABLE TO FIND Stool softner    . UNABLE  TO FIND tribulus    . UNABLE TO FIND hemcleer     No current facility-administered medications for this visit.     Family History  Problem Relation Age of Onset  . Diabetes Father   . Diabetes Paternal Grandmother   . Diabetes Paternal Grandfather     Review of Systems  Constitutional: Negative.   HENT: Negative.   Eyes: Negative.   Respiratory: Negative.   Cardiovascular: Negative.   Gastrointestinal: Negative.   Endocrine: Negative.   Genitourinary: Negative.   Musculoskeletal: Negative.   Skin: Negative.   Allergic/Immunologic: Negative.   Neurological: Negative.   Hematological: Negative.   Psychiatric/Behavioral: Negative.   Normal BM, she bleeds with BM. Has been bleeding daily for the last 1-2 weeks, she hasn't bleed today. She has had hemorrhoid banding x 2. Was told she would need a hemorrhoidectomy.  In the past she needed a blood transfusion for hemorrhaging from her hemorrhoid.   Exam:   BP 120/70   Pulse 70   Resp 16   Ht 5' 2.75" (1.594 m)   Wt 188 lb (85.3 kg)   LMP 02/20/2017 (Exact Date)   BMI 33.57 kg/m   Weight change: @WEIGHTCHANGE @ Height:   Height: 5' 2.75" (159.4 cm)  Ht Readings from Last 3 Encounters:  03/02/17 5' 2.75" (1.594 m)  11/29/16 5\' 4"  (1.626 m)  07/25/16 5\' 4"  (1.626 m)    General appearance: alert, cooperative and appears stated age Head: Normocephalic, without obvious abnormality, atraumatic Neck: no adenopathy, supple, symmetrical, trachea midline and thyroid normal to inspection and palpation Lungs: clear to auscultation bilaterally Cardiovascular: regular rate and rhythm Breasts: normal appearance, no masses or tenderness Abdomen: soft, non-tender; non distended,  no masses,  no organomegaly Extremities: extremities normal, atraumatic, no cyanosis or edema Skin: Skin color, texture, turgor normal. No rashes or lesions Lymph nodes: Cervical, supraclavicular, and axillary nodes normal. No abnormal inguinal nodes  palpated Neurologic: Grossly normal   Pelvic: External genitalia:  no lesions              Urethra:  normal appearing urethra with no masses, tenderness or lesions              Bartholins and Skenes: normal                 Vagina: normal appearing vagina with normal color and discharge, no lesions, no signs of a fistula              Cervix: no lesions, +/- cervical motion tenderness               Bimanual Exam:  Uterus:  anteverted, mobile, very tender, normal sized              Adnexa: no mass, fullness, tenderness               Rectovaginal: Confirms               Anus:  normal sphincter tone, no lesions  Chaperone was present for exam.  The patient  had questions about her prior salpingectomy and polypectomy (done by another provider in another practice), I was able to show her the pictures from the surgery and explain it to her. She was confused about what was done.   A:  Well Woman with normal exam  Dyspareunia  Dysmenorrhea  Rectal bleeding, no external hemorrhoids on exam, possible fissure at 1 o'clock, prior hemorrhoidectomy  Spotting most of the month. No blood in her vagina today  Vaginal d/c, intermittent irritation and dryness with intercourse  Pain with orgasm  Uterine tenderness  Rectal fluid drainage with orgasm, I doubt she has a recto-vaginal fistula, I think the pelvic contraction from the orgasm is causing some slight rectal drainage. Will have her f/u with GI for rectal bleeding and evaluation of this c/o. Patient requested that I set her up with GI  P:   Pap with hpv  Screening GC/CT  Vaginitis panel  Return for ultrasound, possible sonohysterogram, possible endometrial biopsy  Referral to GI for evaluation of rectal bleeding (discussed possible internal hemorrhoids vs fissure as possible causes)  Labs with primary  Discussed breast self exam  Discussed calcium and vit D intake  Treat with doxycycline x 10 days, will repeat her exam when she returns for  ultrasound  Will check if she had a TSH or CBC with her primary, if not will draw (no TSH done, declines to draw today)  Affirm sent  Ibuprofen for dysmenorrhea   CC: Charleen Kirks, PA-C  Addendum: labs from primary MD reviewed.  Normal CBC, normal metabolic panel, negative HIV, NR RPR Total cholesterol elevated at 218, LDL elevated at 150, rest of lipid panel was negative.  She also had an elevated screen for depression.  Patient declined a TSH at the time of her visit, will discuss again at her f/u visit

## 2017-03-03 LAB — VAGINITIS/VAGINOSIS, DNA PROBE
CANDIDA SPECIES: NEGATIVE
GARDNERELLA VAGINALIS: NEGATIVE
Trichomonas vaginosis: NEGATIVE

## 2017-03-05 ENCOUNTER — Other Ambulatory Visit: Payer: Self-pay | Admitting: Obstetrics & Gynecology

## 2017-03-05 ENCOUNTER — Telehealth: Payer: Self-pay | Admitting: Obstetrics and Gynecology

## 2017-03-05 NOTE — Telephone Encounter (Signed)
Call placed to patient to review benefits and schedule a recommended sonohysterogram with endometrial biopsy. Left voicemail message requesting a return call.

## 2017-03-05 NOTE — Telephone Encounter (Signed)
Spoke with patient. Reviewed indication for PUS with possible SHGM and EMB. All questions answered. Patient verbalizes understanding. Patient would like to review benefits again before scheduling.

## 2017-03-06 NOTE — Telephone Encounter (Signed)
Spoke with patient regarding pre-certifying a second time the services for the recommended ultrasound with possible sonohysterogram and endometrial biopsy. Patient understood information presented. In trying to schedule the recommended appointment, I explained to patient the availability for ultrasounds/sonohysterograms are on Tuesday's and Thursday's. Patient states she is not "at all" available on those days, as she has class in the morning hours and then immediately goes to work after class, working until 10:00 PM on both Tuesdays and Thursdays. I advised patient I will forward this information to her provider to review.  Routing to Dr Talbert Nan  cc: Thayer Ohm

## 2017-03-06 NOTE — Telephone Encounter (Signed)
Returned call to patient to schedule and answer benefit questions regarding recommended ultrasound with possible sonohysterogram and endometrial biopsy. Left voicemail message requesting a return call.

## 2017-03-07 ENCOUNTER — Telehealth: Payer: Self-pay | Admitting: Obstetrics and Gynecology

## 2017-03-07 LAB — CYTOLOGY - PAP
Chlamydia: NEGATIVE
Diagnosis: NEGATIVE
HPV: NOT DETECTED
Neisseria Gonorrhea: NEGATIVE

## 2017-03-07 NOTE — Telephone Encounter (Signed)
Patient called for her recent pap results. She seemed anxious to get them stating, "I was told the results would be available today and I'd like them reviewed with me as soon as possible."

## 2017-03-07 NOTE — Telephone Encounter (Signed)
Spoke with patient. Advised pap results from 03/02/2017 have not yet resulted. Once these have returned she will be contacted with results.Patient is agreeable.  Routing to provider for final review. Patient agreeable to disposition. Will close encounter.

## 2017-03-07 NOTE — Telephone Encounter (Signed)
Meghan Carter, Can you help [place] find a solution? One option would be to start with a TV ultrasound at the hospital and then go from there. Other option could be to see if we could give her a note for school or work? Thanks

## 2017-03-30 NOTE — Telephone Encounter (Signed)
Follow-up call to patient. Feels frustrated with scheduling limitations. Has had significant discomfort for 2 weeks after pap smear. Multiple concerns regarding current plan. Declines PUS at another facility. Request to see Dr Quincy Simmonds who PCP originally referred her to. Patient will cancel part of class on Tuesday to see Dr Quincy Simmonds.  PUS/poss SHGM/ Poss endo Bx scheduled for Tuesday 04-03-17 at 1130 with Dr Quincy Simmonds.   Routing to both Dr Talbert Nan and Dr Quincy Simmonds for update and final review. Patient agreeable to disposition. Will close encounter.

## 2017-04-03 ENCOUNTER — Other Ambulatory Visit: Payer: Self-pay | Admitting: Obstetrics and Gynecology

## 2017-04-03 ENCOUNTER — Other Ambulatory Visit: Payer: Self-pay

## 2017-04-03 ENCOUNTER — Other Ambulatory Visit (INDEPENDENT_AMBULATORY_CARE_PROVIDER_SITE_OTHER): Payer: BLUE CROSS/BLUE SHIELD

## 2017-04-03 ENCOUNTER — Other Ambulatory Visit: Payer: Self-pay | Admitting: *Deleted

## 2017-04-03 ENCOUNTER — Encounter: Payer: Self-pay | Admitting: Obstetrics and Gynecology

## 2017-04-03 ENCOUNTER — Ambulatory Visit: Payer: BLUE CROSS/BLUE SHIELD | Admitting: Obstetrics and Gynecology

## 2017-04-03 VITALS — BP 108/68 | HR 88 | Resp 18 | Wt 197.5 lb

## 2017-04-03 DIAGNOSIS — N949 Unspecified condition associated with female genital organs and menstrual cycle: Secondary | ICD-10-CM

## 2017-04-03 DIAGNOSIS — N939 Abnormal uterine and vaginal bleeding, unspecified: Secondary | ICD-10-CM

## 2017-04-03 DIAGNOSIS — N946 Dysmenorrhea, unspecified: Secondary | ICD-10-CM | POA: Diagnosis not present

## 2017-04-03 DIAGNOSIS — D219 Benign neoplasm of connective and other soft tissue, unspecified: Secondary | ICD-10-CM | POA: Diagnosis not present

## 2017-04-03 NOTE — Progress Notes (Signed)
Encounter reviewed by Dr. [name] Amundson C. Silva.  

## 2017-04-03 NOTE — Progress Notes (Signed)
GYNECOLOGY  VISIT   HPI: 37 y.o.   Legally Separated  African American  female   [place] with Patient's last menstrual period was 03/13/2017 (approximate).   here for  Pelvic ultrasound and possible sonohysterogram/endometrial biopsy.  Patient has history of abnormal uterine bleeding and cramping.   Spotting in between menses.  Severe cramping. She had a negative vaginitis and cervicitis evaluation on 03/03/17 with Dr. Talbert Nan.  She took a course of doxycycline 100 mg po bid x 10 days.  Normal CBC on 02/26/17 at outside office.  Report scanned in Epic.  After having ultrasound today, she states she is in pain.   She has not been sexually active in January and February.   GYNECOLOGIC HISTORY: Patient's last menstrual period was 03/13/2017 (approximate). Contraception:  abstinence Menopausal hormone therapy:  n/a Last mammogram:  none Last pap smear:   03/02/17 negative, HR HPV negative         OB History    Gravida Para Term Preterm AB Living   1       1     SAB TAB Ectopic Multiple Live Births       1             Patient Active Problem List   Diagnosis Date Noted  . Acute blood loss anemia 03/26/2015  . Severe anemia   . Lower abdominal pain   . Weakness   . Other fatigue   . Rectal pain 03/19/2014  . 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 Endometrial polyp 02/11/2014  . History of abnormal cervical Pap smear 02/11/2014  . Pelvic pain in female 01/27/2014  . Menorrhagia with regular cycle 01/27/2014  . Rectal bleeding 01/27/2014  . Fecal occult blood test positive 01/27/2014    Past Medical History:  Diagnosis Date  . Anxiety   . Blood transfusion without reported diagnosis   . Depression   . Dysmenorrhea   . Endometrial polyp 02/11/2014  . Fecal occult blood test positive 01/27/2014  . History of abnormal cervical Pap smear 02/11/2014  . HSV (herpes simplex virus) infection   . Hydrosalpinx   . Menorrhagia with regular cycle 01/27/2014  . Pelvic pain in female 01/27/2014   . Rectal bleeding 01/27/2014  . Vaginal Pap smear, abnormal     Past Surgical History:  Procedure Laterality Date  . COLONOSCOPY N/A 03/23/2014   SLF: 1. The examined terminal ileum appeared to be normal. 2. The left colon is redundant 3. Moderate sized internal hemorrhoids  . ECTOPIC PREGNANCY SURGERY Left 2005  . FLEXIBLE SIGMOIDOSCOPY N/A 04/01/2015   SLF: rectal bleeding due to large internal hemorrhoids 2. anemia due to rectal bleeding/memses/ poor iron intake.  Marland Kitchen FLEXIBLE SIGMOIDOSCOPY N/A 03/27/2015   Procedure: FLEXIBLE SIGMOIDOSCOPY;  Surgeon: Danie Binder, MD;  [facility];  Service: Endoscopy;  Laterality: N/A;  . HEMORRHOID BANDING N/A 03/23/2014   Procedure: HEMORRHOID BANDING;  Surgeon: Danie Binder, MD;  [facility];  Service: Endoscopy;  Laterality: N/A;  . HEMORRHOID BANDING  03/27/2015   Procedure: HEMORRHOID BANDING;  Surgeon: Danie Binder, MD;  [facility];  Service: Endoscopy;;  . HYSTEROSCOPY W/D&C N/A 06/16/2014   Procedure: DILATATION AND CURETTAGE Pollyann Glen;  Surgeon: Jonnie Kind, MD;  Location[facility];  Service: Gynecology;  Laterality: N/A;  . LAPAROSCOPIC UNILATERAL SALPINGECTOMY Right 06/16/2014   Procedure: LAPAROSCOPIC UNILATERAL SALPINGECTOMY;  Surgeon: Jonnie Kind, MD;  Location: [facility];  Service: Gynecology;  Laterality: Right;  . POLYPECTOMY  N/A 06/16/2014   Procedure: POLYPECTOMY (endometrial);  Surgeon: Jonnie Kind, MD;  Location: [facility];  Service: Gynecology;  Laterality: N/A;    Current Outpatient Medications  Medication Sig Dispense Refill  . ibuprofen (ADVIL,MOTRIN) 800 MG tablet Take 1 tablet (800 mg total) by mouth every 8 (eight) hours as needed. 30 tablet 1  . UNABLE TO FIND Stool softner    . UNABLE TO FIND tribulus    . UNABLE TO FIND hemclear     No current facility-administered medications for this visit.      ALLERGIES: Patient has no known allergies.  Family History  Problem  Relation Age of Onset  . Diabetes Father   . Diabetes Paternal Grandmother   . Diabetes Paternal Grandfather     Social History   Socioeconomic History  . Marital status: Legally Separated    Spouse name: Not on file  . Number of children: Not on file  . Years of education: Not on file  . Highest education level: Not on file  Social Needs  . Financial resource strain: Not on file  . Food insecurity - worry: Not on file  . Food insecurity - inability: Not on file  . Transportation needs - medical: Not on file  . Transportation needs - non-medical: Not on file  Occupational History  . Not on file  Tobacco Use  . Smoking status: Light Tobacco Smoker    Packs/day: 0.25    Years: 22.00    Pack years: 5.50    Types: Cigarettes  . Smokeless tobacco: Never Used  Substance and Sexual Activity  . Alcohol use: Yes    Alcohol/week: 1.2 - 1.8 oz    Types: 2 - 3 Standard drinks or equivalent per week  . Drug use: No  . Sexual activity: No    Partners: Male    Birth control/protection: None, Abstinence  Other Topics Concern  . Not on file  Social History Narrative  . Not on file    ROS:  Pertinent items are noted in HPI.  PHYSICAL EXAMINATION:    BP 108/68 (BP Location: Right Arm, Patient Position: Sitting, Cuff Size: Normal)   Pulse 88   Resp 18   Wt 197 lb 8 oz (89.6 kg)   LMP 03/13/2017 (Approximate)   BMI 35.26 kg/m     General appearance:  Tearful and appears nervous.  Pelvic: External genitalia:  no lesions              Urethra:  normal appearing urethra with no masses, tenderness or lesions              Bartholins and Skenes: normal                 Vagina: normal appearing vagina with normal color and discharge, no lesions              Cervix: no lesions                Bimanual Exam:  Uterus:  normal size, contour, position, consistency, mobility, non-tender              Adnexa: no mass, fullness, tenderness          Chaperone was present for exam.  Pelvic  ultrasound: Uterine fibroid 1.Meghan mm.  EMS 5.65 mm. 1.8 cm right CL cyst.  Left ovary normal. No free fluid.   ASSESSMENT  Abnormal uterine bleeding.  Uterine fibroid.  Dysmenorrhea.   PLAN  I had a  preliminary discussion with the patient in the ultrasound suite immediately following her pelvic ultrasound. I reviewed the finding of the small fibroid and then normal lining of the endometrium.  Patient appeared very nervous and hesitant about proceeding with the sonohysterogram and potential endometrial biopsy.  This is my first time meeting the patient, so I suggested we discuss her history and ultrasound further in a regular exam room to find the best plan for her care.  I did mention hysteroscopy with dilation and curettage as an alternative if a patient was not able to proceed with an endometrial biopsy in an office setting.  I left the room and went to another patient room while she got dressed.  I was then informed that she needed to leave as she was due at 2:00 pm in [place] where she works.  I invited her to return for a follow up visit as a phone call was not appropriate for me to make a diagnosis and render care.    An After Visit Summary was printed and given to the patient.  __10____ minutes face to face time of which over 50% was spent in counseling.

## 2017-04-05 ENCOUNTER — Telehealth: Payer: Self-pay | Admitting: Obstetrics and Gynecology

## 2017-04-05 NOTE — Telephone Encounter (Signed)
Call transferred to me per patient request to speak to clinical supervisor. Upon answering call, patient requested phone number for Director of Operations and [employer]. Phone number for [facility] and ask for HR directly. They will be able to get her to correct departments. Patient proceeds to demand ultrasound results that she did not receive on Tuesday. She states she was kept waiting in a room for 15 minutes at which point she realized the time and could not wait any longer because she had to be at work. 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  Patient continues to be upset that we only offer ultrasound appointments on Tuesday and Thursday. Feels this is not fair to patients that have schedule restrictions. (see previous phone note)  Patient wants ultrasound results over phone. Advised this will need to discussed during office visit. Diagnosis, implications and treatment options. Appointment scheduled for Friday 04-06-17 at 1000.  Patient again mentions dissatisfaction with overall outcome during interactions and appointments.    Advised that perhaps expectations and our ability to meet them did not match. This is not right or wrong, simply not a match and we dont want to continue to fail to meet her expectations. Important that we get her to a place where her expectations are being met. Patient then upset that I want to "throw her away."   Advised that she is welcome to appointment to discuss results, we are simply attempting to meet her needs.  Patient states she desires to come for results as scheduled for Friday.  Glorianne Manchester RN present for 14 minute call.

## 2017-04-05 NOTE — Telephone Encounter (Signed)
Patient would like to speak with a nurse about results from [facility] 04/03/17

## 2017-04-05 NOTE — Telephone Encounter (Signed)
Patient called back approximately 1045. Patient states she has been very anxious about results.Has been reading about 'thyroid" and is concerned about what she has read online and her symptoms. She is already under significant stress and now concerned about thyroid issues. Advised that she will still need to review results with Dr Quincy Simmonds but want to clarify that her pelvic ultrasound shows a uterine fibroid. Dr Quincy Simmonds will review size, implications and treatment options. Very brief overview provided but advised this is from nurse only and that it is just to relieve her concerns until she sees MD tomorrow.  patient appreciative and wants office staff to have a "blessed day."   Routing to provider for final review. Patient agreeable to disposition. Will close encounter.

## 2017-04-06 ENCOUNTER — Encounter: Payer: Self-pay | Admitting: Obstetrics and Gynecology

## 2017-04-06 ENCOUNTER — Other Ambulatory Visit: Payer: Self-pay

## 2017-04-06 ENCOUNTER — Ambulatory Visit: Payer: BLUE CROSS/BLUE SHIELD | Admitting: Obstetrics and Gynecology

## 2017-04-06 VITALS — BP 110/68 | HR 88 | Resp 16 | Wt 194.0 lb

## 2017-04-06 DIAGNOSIS — D219 Benign neoplasm of connective and other soft tissue, unspecified: Secondary | ICD-10-CM

## 2017-04-06 DIAGNOSIS — N941 Unspecified dyspareunia: Secondary | ICD-10-CM

## 2017-04-06 DIAGNOSIS — N946 Dysmenorrhea, unspecified: Secondary | ICD-10-CM | POA: Diagnosis not present

## 2017-04-06 DIAGNOSIS — N921 Excessive and frequent menstruation with irregular cycle: Secondary | ICD-10-CM | POA: Diagnosis not present

## 2017-04-06 NOTE — Progress Notes (Signed)
GYNECOLOGY  VISIT   HPI: 37 y.o.   Legally Separated  African American  female   Meghan Carter with Patient's last menstrual period was 04/04/2017.   here for  Discuss [place] results. She had a lot of pain the day of her pelvic ultrasound, and she was unable to complete the sonohysterogram and endometrial biopsy.  She was also in a hurry to go to work and had to leave prior to completing the consultation.   States she is in a hurry today.   States she wants someone to do a test to check her entire body from head to toe.   Has spotting after her menstrual cycle finishes (has about 12 days of bleeding), uterine fibroid, painful intercourse, and painful periods.  Decreased sexual response.   Having rectal fluid coming out during sex.  Has not scheduled gastroenterology visit yet.   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 Would welcome pregnancy and states she is unable to achieve pregnancy.  GYNECOLOGIC HISTORY: Patient's last menstrual period was 04/04/2017. Contraception:  Abstinence  Menopausal hormone therapy:  n/a Last mammogram:  none Last pap smear:   03/02/17 Pap and HR HPV negative        OB History    Gravida Para Term Preterm AB Living   1       1     SAB TAB Ectopic Multiple Live Births       1             Patient Active Problem List   Diagnosis Date Noted  . Acute blood loss anemia 03/26/2015  . Severe anemia   . Lower abdominal pain   . Weakness   . Other fatigue   . Rectal pain 03/19/2014  . 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 Endometrial polyp 02/11/2014  . History of abnormal cervical Pap smear 02/11/2014  . Pelvic pain in female 01/27/2014  . Menorrhagia with regular cycle 01/27/2014  . Rectal bleeding 01/27/2014  . Fecal occult blood test positive 01/27/2014    Past Medical History:  Diagnosis Date  . Anxiety   . Blood transfusion without reported diagnosis   . Depression   . Dysmenorrhea   . Endometrial polyp 02/11/2014  . Fecal  occult blood test positive 01/27/2014  . History of abnormal cervical Pap smear 02/11/2014  . HSV (herpes simplex virus) infection   . Hydrosalpinx   . Menorrhagia with regular cycle 01/27/2014  . Pelvic pain in female 01/27/2014  . Rectal bleeding 01/27/2014  . Vaginal Pap smear, abnormal     Past Surgical History:  Procedure Laterality Date  . COLONOSCOPY N/A 03/23/2014   SLF: 1. The examined terminal ileum appeared to be normal. 2. The left colon is redundant 3. Moderate sized internal hemorrhoids  . ECTOPIC PREGNANCY SURGERY Left 2005  . FLEXIBLE SIGMOIDOSCOPY N/A 04/01/2015   SLF: rectal bleeding due to large internal hemorrhoids 2. anemia due to rectal bleeding/memses/ poor iron intake.  Marland Kitchen FLEXIBLE SIGMOIDOSCOPY N/A 03/27/2015   Procedure: FLEXIBLE SIGMOIDOSCOPY;  Surgeon: Danie Binder, MD;  [facility];  Service: Endoscopy;  Laterality: N/A;  . HEMORRHOID BANDING N/A 03/23/2014   Procedure: HEMORRHOID BANDING;  Surgeon: Danie Binder, MD;  [facility];  Service: Endoscopy;  Laterality: N/A;  . HEMORRHOID BANDING  03/27/2015   Procedure: HEMORRHOID BANDING;  Surgeon: Danie Binder, MD;  [facility];  Service: Endoscopy;;  . HYSTEROSCOPY W/D&C N/A 06/16/2014   Procedure: DILATATION AND CURETTAGE /  HYSTEROSCOPY;  Surgeon: Jonnie Kind, MD;  Location: [facility];  Service: Gynecology;  Laterality: N/A;  . LAPAROSCOPIC UNILATERAL SALPINGECTOMY Right 06/16/2014   Procedure: LAPAROSCOPIC UNILATERAL SALPINGECTOMY;  Surgeon: Jonnie Kind, MD;  Location: [facility];  Service: Gynecology;  Laterality: Right;  . POLYPECTOMY N/A 06/16/2014   Procedure: POLYPECTOMY (endometrial);  Surgeon: Jonnie Kind, MD;  Location: [facility];  Service: Gynecology;  Laterality: N/A;    Current Outpatient Medications  Medication Sig Dispense Refill  . ibuprofen (ADVIL,MOTRIN) 800 MG tablet Take 1 tablet (800 mg total) by mouth every 8 (eight) hours as needed. 30 tablet 1  .  UNABLE TO FIND Stool softner    . UNABLE TO FIND tribulus    . UNABLE TO FIND hemclear     No current facility-administered medications for this visit.      ALLERGIES: Patient has no known allergies.  Family History  Problem Relation Age of Onset  . Diabetes Father   . Diabetes Paternal Grandmother   . Diabetes Paternal Grandfather     Social History   Socioeconomic History  . Marital status: Legally Separated    Spouse name: Not on file  . Number of children: Not on file  . Years of education: Not on file  . Highest education level: Not on file  Social Needs  . Financial resource strain: Not on file  . Food insecurity - worry: Not on file  . Food insecurity - inability: Not on file  . Transportation needs - medical: Not on file  . Transportation needs - non-medical: Not on file  Occupational History  . Not on file  Tobacco Use  . Smoking status: Light Tobacco Smoker    Packs/day: 0.25    Years: 22.00    Pack years: 5.50    Types: Cigarettes  . Smokeless tobacco: Never Used  Substance and Sexual Activity  . Alcohol use: Yes    Alcohol/week: 1.2 - 1.8 oz    Types: 2 - 3 Standard drinks or equivalent per week  . Drug use: No  . Sexual activity: No    Partners: Male    Birth control/protection: None, Abstinence  Other Topics Concern  . Not on file  Social History Narrative  . Not on file    ROS:  Pertinent items are noted in HPI.  PHYSICAL EXAMINATION:    BP 110/68 (BP Location: Right Arm, Patient Position: Sitting, Cuff Size: Large)   Pulse 88   Resp 16   Wt 194 lb (88 kg)   LMP 04/04/2017   BMI 34.64 kg/m     General appearance: alert, cooperative and appears stated age   Pelvic ultrasound: 1.7 cm anterior intramural fibroid.  EMS 5.65 mm. Normal ovaries.  1.8 cm right CL cyst.  No free fluid.  ASSESSMENT  Lingering menses.  Dysmenorrhea.  Dyspareunia.  Decreased sexual response. Desire for fertility.   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 Status  post hysteroscopic polypectomy and laparoscopic salpingectomy.  AMA status. Hx rectal bleeding and rectal fluid leakage.  PLAN  We discussed fibroid, pelvic pain, abnormal uterine bleeding.  We discussed contraception as a way to control bleeding and pain, and patient declines this.  She may need a hysteroscopy and laparoscopy which I believe is best done through a reproductive endocrinologist/infertility specialist who can evaluate and treat her fertility issues and any potential endometriosis.   We discussed her advanced maternal age and effect on decreasing fertility, so doing an evaluation and treatment now makes sense. Will make  referral to Dr. Kerin Perna. ACOG HO on fibroids and abnormal uterine bleeding to patient.  I strongly recommended she follow up with the referral to see GI.   An After Visit Summary was printed and given to the patient.  __25____ minutes face to face time of which over 50% was spent in counseling.   Lamont Snowball, RN, Nurse Supervisor present for the entire visit.

## 2017-04-11 ENCOUNTER — Ambulatory Visit: Payer: BLUE CROSS/BLUE SHIELD | Admitting: Obstetrics and Gynecology

## 2017-04-13 ENCOUNTER — Other Ambulatory Visit: Payer: Self-pay | Admitting: *Deleted

## 2017-04-13 DIAGNOSIS — R102 Pelvic and perineal pain: Principal | ICD-10-CM

## 2017-04-13 DIAGNOSIS — N921 Excessive and frequent menstruation with irregular cycle: Secondary | ICD-10-CM

## 2017-04-13 DIAGNOSIS — G8929 Other chronic pain: Secondary | ICD-10-CM

## 2017-04-13 DIAGNOSIS — N946 Dysmenorrhea, unspecified: Secondary | ICD-10-CM

## 2017-04-13 DIAGNOSIS — N941 Unspecified dyspareunia: Secondary | ICD-10-CM

## 2017-05-03 ENCOUNTER — Encounter: Payer: Self-pay | Admitting: Obstetrics and Gynecology

## 2017-08-08 ENCOUNTER — Encounter (HOSPITAL_BASED_OUTPATIENT_CLINIC_OR_DEPARTMENT_OTHER): Payer: Self-pay

## 2017-08-08 ENCOUNTER — Other Ambulatory Visit: Payer: Self-pay

## 2017-08-08 NOTE — Progress Notes (Signed)
Spoke with:  Sweta NPO:  No food after midnight/Clear liquids until 8:15 AM DOS, No smoking after midnight Arrival time: 1215PM Labs: Orders pending AM medications: None Pre op orders: No orders at this time Ride home: [place] (cousin) [phone]

## 2017-08-14 ENCOUNTER — Ambulatory Visit (HOSPITAL_BASED_OUTPATIENT_CLINIC_OR_DEPARTMENT_OTHER): Payer: BLUE CROSS/BLUE SHIELD | Admitting: Anesthesiology

## 2017-08-14 ENCOUNTER — Ambulatory Visit (HOSPITAL_BASED_OUTPATIENT_CLINIC_OR_DEPARTMENT_OTHER)
Admission: RE | Admit: 2017-08-14 | Discharge: 2017-08-14 | Disposition: A | Discharge status: Home / Self Care | Payer: BLUE CROSS/BLUE SHIELD | Source: Ambulatory Visit | Attending: Obstetrics and Gynecology | Admitting: Obstetrics and Gynecology

## 2017-08-14 ENCOUNTER — Encounter (HOSPITAL_COMMUNITY)
Admission: RE | Disposition: A | Discharge status: Home / Self Care | Payer: Self-pay | Source: Ambulatory Visit | Attending: Obstetrics and Gynecology

## 2017-08-14 ENCOUNTER — Other Ambulatory Visit: Payer: Self-pay

## 2017-08-14 ENCOUNTER — Encounter (HOSPITAL_COMMUNITY): Payer: Self-pay | Admitting: *Deleted

## 2017-08-14 DIAGNOSIS — N802 Endometriosis of fallopian tube: Secondary | ICD-10-CM | POA: Diagnosis not present

## 2017-08-14 DIAGNOSIS — D252 Subserosal leiomyoma of uterus: Secondary | ICD-10-CM | POA: Insufficient documentation

## 2017-08-14 DIAGNOSIS — N7011 Chronic salpingitis: Secondary | ICD-10-CM | POA: Insufficient documentation

## 2017-08-14 DIAGNOSIS — R102 Pelvic and perineal pain: Secondary | ICD-10-CM | POA: Diagnosis present

## 2017-08-14 DIAGNOSIS — F17210 Nicotine dependence, cigarettes, uncomplicated: Secondary | ICD-10-CM | POA: Diagnosis not present

## 2017-08-14 DIAGNOSIS — G8929 Other chronic pain: Secondary | ICD-10-CM | POA: Diagnosis not present

## 2017-08-14 DIAGNOSIS — Z791 Long term (current) use of non-steroidal anti-inflammatories (NSAID): Secondary | ICD-10-CM | POA: Diagnosis not present

## 2017-08-14 DIAGNOSIS — N946 Dysmenorrhea, unspecified: Secondary | ICD-10-CM | POA: Insufficient documentation

## 2017-08-14 DIAGNOSIS — N736 Female pelvic peritoneal adhesions (postinfective): Secondary | ICD-10-CM | POA: Insufficient documentation

## 2017-08-14 DIAGNOSIS — F10930 Alcohol use, unspecified with withdrawal, uncomplicated: Secondary | ICD-10-CM

## 2017-08-14 DIAGNOSIS — F10230 Alcohol dependence with withdrawal, uncomplicated: Secondary | ICD-10-CM

## 2017-08-14 HISTORY — DX: Anemia, unspecified: D64.9

## 2017-08-14 HISTORY — DX: Other hemorrhoids: K64.8

## 2017-08-14 HISTORY — PX: CHROMOPERTUBATION: SHX6288

## 2017-08-14 HISTORY — PX: LAPAROSCOPY: SHX197

## 2017-08-14 LAB — COMPREHENSIVE METABOLIC PANEL
ALBUMIN: 4.1 g/dL (ref 3.5–5.0)
ALT: 14 U/L (ref 0–44)
ANION GAP: 8 (ref 5–15)
AST: 22 U/L (ref 15–41)
Alkaline Phosphatase: 49 U/L (ref 38–126)
BILIRUBIN TOTAL: 0.8 mg/dL (ref 0.3–1.2)
BUN: 10 mg/dL (ref 6–20)
CO2: 23 mmol/L (ref 22–32)
Calcium: 9.1 mg/dL (ref 8.9–10.3)
Chloride: 108 mmol/L (ref 98–111)
Creatinine, Ser: 0.73 mg/dL (ref 0.44–1.00)
GFR calc Af Amer: >60 mL/min (ref >60)
GFR calc non Af Amer: >60 mL/min (ref >60)
GLUCOSE: 84 mg/dL (ref 70–99)
POTASSIUM: 4 mmol/L (ref 3.5–5.1)
Sodium: 139 mmol/L (ref 135–145)
TOTAL PROTEIN: 7.5 g/dL (ref 6.5–8.1)

## 2017-08-14 LAB — CBC
HEMATOCRIT: 39.6 % (ref 36.0–46.0)
Hemoglobin: 12.5 g/dL (ref 12.0–15.0)
MCH: 26.4 pg (ref 26.0–34.0)
MCHC: 31.6 g/dL (ref 30.0–36.0)
MCV: 83.7 fL (ref 78.0–100.0)
PLATELETS: 305 10*3/uL (ref 150–400)
RBC: 4.73 MIL/uL (ref 3.87–5.11)
RDW: 17.3 % — AB (ref 11.5–15.5)
WBC: 4.5 10*3/uL (ref 4.0–10.5)

## 2017-08-14 LAB — TYPE AND SCREEN
ABO/RH(D): A POS
ANTIBODY SCREEN: NEGATIVE

## 2017-08-14 SURGERY — LAPAROSCOPY OPERATIVE
Anesthesia: General | Site: Vagina

## 2017-08-14 MED ORDER — FENTANYL CITRATE (PF) 100 MCG/2ML IJ SOLN
INTRAMUSCULAR | Status: DC | PRN
  Administered 2017-08-14: 50 ug via INTRAVENOUS
  Administered 2017-08-14 (×2): 100 ug via INTRAVENOUS

## 2017-08-14 MED ORDER — PROMETHAZINE HCL 25 MG/ML IJ SOLN: 6.2500 mg | INTRAMUSCULAR | Status: DC | PRN

## 2017-08-14 MED ORDER — CIPROFLOXACIN IN D5W 400 MG/200ML IV SOLN
400.0000 mg | INTRAVENOUS | Status: AC
  Administered 2017-08-14: 400 mg via INTRAVENOUS
  Filled 2017-08-14: qty 200

## 2017-08-14 MED ORDER — EPHEDRINE SULFATE-NACL 50-0.9 MG/10ML-% IV SOSY
PREFILLED_SYRINGE | INTRAVENOUS | Status: DC | PRN
  Administered 2017-08-14: 10 mg via INTRAVENOUS

## 2017-08-14 MED ORDER — DEXAMETHASONE SODIUM PHOSPHATE 10 MG/ML IJ SOLN
INTRAMUSCULAR | Status: DC | PRN
  Administered 2017-08-14: 5 mg via INTRAVENOUS

## 2017-08-14 MED ORDER — ROCURONIUM BROMIDE 50 MG/5ML IV SOSY
PREFILLED_SYRINGE | INTRAVENOUS | Status: DC | PRN
  Administered 2017-08-14: 50 mg via INTRAVENOUS

## 2017-08-14 MED ORDER — MEPERIDINE HCL 50 MG/ML IJ SOLN: 6.2500 mg | INTRAMUSCULAR | Status: DC | PRN

## 2017-08-14 MED ORDER — LIDOCAINE 2% (20 MG/ML) 5 ML SYRINGE
INTRAMUSCULAR | Status: DC | PRN
  Administered 2017-08-14: 60 mg via INTRAVENOUS

## 2017-08-14 MED ORDER — HYDROMORPHONE HCL 1 MG/ML IJ SOLN
0.2500 mg | INTRAMUSCULAR | Status: DC | PRN
  Administered 2017-08-14 (×4): 0.5 mg via INTRAVENOUS

## 2017-08-14 MED ORDER — LACTATED RINGERS IV SOLN
INTRAVENOUS | Status: DC
  Administered 2017-08-14 (×2): via INTRAVENOUS

## 2017-08-14 MED ORDER — METHYLENE BLUE 0.5 % INJ SOLN
INTRAVENOUS | Status: AC
  Filled 2017-08-14: qty 10

## 2017-08-14 MED ORDER — METHYLENE BLUE 0.5 % INJ SOLN
INTRAVENOUS | Status: DC | PRN
  Administered 2017-08-14: 5 mL

## 2017-08-14 MED ORDER — FENTANYL CITRATE (PF) 100 MCG/2ML IJ SOLN
INTRAMUSCULAR | Status: AC
  Filled 2017-08-14: qty 2

## 2017-08-14 MED ORDER — MIDAZOLAM HCL 2 MG/2ML IJ SOLN
INTRAMUSCULAR | Status: AC
  Filled 2017-08-14: qty 2

## 2017-08-14 MED ORDER — BUPIVACAINE-EPINEPHRINE 0.25% -1:200000 IJ SOLN
INTRAMUSCULAR | Status: DC | PRN
  Administered 2017-08-14: 30 mL

## 2017-08-14 MED ORDER — KETOROLAC TROMETHAMINE 30 MG/ML IJ SOLN
INTRAMUSCULAR | Status: DC | PRN
  Administered 2017-08-14: 15 mg via INTRAVENOUS

## 2017-08-14 MED ORDER — OXYCODONE HCL 5 MG/5ML PO SOLN
5.0000 mg | Freq: Once | ORAL | Status: DC | PRN
  Filled 2017-08-14: qty 5

## 2017-08-14 MED ORDER — OXYCODONE HCL 5 MG PO TABS: 5.0000 mg | ORAL_TABLET | Freq: Once | ORAL | Status: DC | PRN

## 2017-08-14 MED ORDER — HYDROMORPHONE HCL 1 MG/ML IJ SOLN
INTRAMUSCULAR | Status: AC
  Filled 2017-08-14: qty 1

## 2017-08-14 MED ORDER — EPHEDRINE 5 MG/ML INJ
INTRAVENOUS | Status: AC
  Filled 2017-08-14: qty 10

## 2017-08-14 MED ORDER — PROPOFOL 10 MG/ML IV BOLUS
INTRAVENOUS | Status: DC | PRN
  Administered 2017-08-14: 200 mg via INTRAVENOUS

## 2017-08-14 MED ORDER — PROPOFOL 10 MG/ML IV BOLUS
INTRAVENOUS | Status: AC
  Filled 2017-08-14: qty 20

## 2017-08-14 MED ORDER — MIDAZOLAM HCL 5 MG/5ML IJ SOLN
INTRAMUSCULAR | Status: DC | PRN
  Administered 2017-08-14: 2 mg via INTRAVENOUS

## 2017-08-14 MED ORDER — ONDANSETRON HCL 4 MG/2ML IJ SOLN
INTRAMUSCULAR | Status: DC | PRN
  Administered 2017-08-14: 4 mg via INTRAVENOUS

## 2017-08-14 MED ORDER — SODIUM CHLORIDE 0.9 % IJ SOLN
INTRAMUSCULAR | Status: DC | PRN
  Administered 2017-08-14: 500 mL via INTRAVENOUS

## 2017-08-14 MED ORDER — ONDANSETRON HCL 4 MG/2ML IJ SOLN
INTRAMUSCULAR | Status: AC
  Filled 2017-08-14: qty 2

## 2017-08-14 MED ORDER — BUPIVACAINE-EPINEPHRINE (PF) 0.25% -1:200000 IJ SOLN
INTRAMUSCULAR | Status: AC
  Filled 2017-08-14: qty 30

## 2017-08-14 MED ORDER — LIDOCAINE 2% (20 MG/ML) 5 ML SYRINGE
INTRAMUSCULAR | Status: AC
  Filled 2017-08-14: qty 5

## 2017-08-14 MED ORDER — SUGAMMADEX SODIUM 200 MG/2ML IV SOLN
INTRAVENOUS | Status: AC
  Filled 2017-08-14: qty 2

## 2017-08-14 MED ORDER — CLINDAMYCIN PHOSPHATE 900 MG/50ML IV SOLN
900.0000 mg | INTRAVENOUS | Status: AC
  Administered 2017-08-14: 900 mg via INTRAVENOUS
  Filled 2017-08-14: qty 50

## 2017-08-14 MED ORDER — SUGAMMADEX SODIUM 200 MG/2ML IV SOLN
INTRAVENOUS | Status: DC | PRN
  Administered 2017-08-14: 200 mg via INTRAVENOUS

## 2017-08-14 SURGICAL SUPPLY — 34 items
CABLE HIGH FREQUENCY MONO STRZ (ELECTRODE) ×3 IMPLANT
CATH ROBINSON RED A/P 18FR (CATHETERS) IMPLANT
CONT SPECI 4OZ STER CLIK (MISCELLANEOUS) IMPLANT
COVER MAYO STAND STRL (DRAPES) ×3 IMPLANT
DERMABOND ADVANCED (GAUZE/BANDAGES/DRESSINGS)
DERMABOND ADVANCED .7 DNX12 (GAUZE/BANDAGES/DRESSINGS) IMPLANT
DRSG COVADERM PLUS 2X2 (GAUZE/BANDAGES/DRESSINGS) IMPLANT
DRSG OPSITE POSTOP 3X4 (GAUZE/BANDAGES/DRESSINGS) IMPLANT
DURAPREP 26ML APPLICATOR (WOUND CARE) ×3 IMPLANT
ELECT REM PT RETURN 9FT ADLT (ELECTROSURGICAL) ×3
ELECTRODE REM PT RTRN 9FT ADLT (ELECTROSURGICAL) ×2 IMPLANT
GLOVE BIO SURGEON STRL SZ8 (GLOVE) ×6 IMPLANT
GOWN STRL REUS W/TWL LRG LVL3 (GOWN DISPOSABLE) ×3 IMPLANT
MANIPULATOR UTERINE 4.5 ZUMI (MISCELLANEOUS) ×3 IMPLANT
NEEDLE INSUFFLATION 120MM (ENDOMECHANICALS) ×3 IMPLANT
PACK LAPAROSCOPY BASIN (CUSTOM PROCEDURE TRAY) ×3 IMPLANT
PACK TRENDGUARD 450 HYBRID PRO (MISCELLANEOUS) ×2 IMPLANT
SCISSORS LAP 5X35 DISP (ENDOMECHANICALS) ×3 IMPLANT
SEPRAFILM MEMBRANE 5X6 (MISCELLANEOUS) IMPLANT
SET IRRIG TUBING LAPAROSCOPIC (IRRIGATION / IRRIGATOR) ×3 IMPLANT
SUT MNCRL AB 4-0 PS2 18 (SUTURE) ×3 IMPLANT
SYR 30ML LL (SYRINGE) ×3 IMPLANT
SYR 50ML LL SCALE MARK (SYRINGE) IMPLANT
SYR 5ML LL (SYRINGE) ×3 IMPLANT
SYR CONTROL 10ML LL (SYRINGE) ×3 IMPLANT
SYRINGE IRR TOOMEY STRL 70CC (SYRINGE) ×3 IMPLANT
SYS BAG RETRIEVAL 10MM (BASKET)
SYSTEM BAG RETRIEVAL 10MM (BASKET) IMPLANT
TOWEL OR 17X24 6PK STRL BLUE (TOWEL DISPOSABLE) ×6 IMPLANT
TRAY FOLEY BAG SILVER LF 14FR (CATHETERS) ×3 IMPLANT
TRENDGUARD 450 HYBRID PRO PACK (MISCELLANEOUS) ×3
TROCAR OPTI TIP 5M 100M (ENDOMECHANICALS) ×6 IMPLANT
TUBING INSUF HEATED (TUBING) ×3 IMPLANT
WARMER LAPAROSCOPE (MISCELLANEOUS) ×3 IMPLANT

## 2017-08-14 NOTE — Op Note (Signed)
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 endotracheal  Complications: None  Estimated blood loss: <10 cc  Specimens: Bilateral uterosacral ligament biopsy, right ovarian fossa biopsy and left tube to pathology.  Findings: On exam under anesthesia, external genitalia, Bartholin's, Skene's, urethra and vagina were normal.  The cervix appeared grossly normal.  The uterus sounded to 7.5 cm.  There was no posterior fornix nodularity palpable.  There was no adnexal masses palpable.  The uterus was normal size, anteverted, and mobile.  On laparoscopy, there were periumbilical omental adhesions surrounding the previous laparoscopy entry site these were lysed.  Upper abdomen, liver surface and diaphragm surfaces were normal. The appendix was visualized and appeared normal. The pelvic peritoneum looked mostly normal.  There were white streaks of fibrosis on the right ovarian fossa and this area was biopsied.   The right tube was surgically absent.  The right ovary was normal except for filmy adhesions on the lateral surface near the hilum, and these were lysed. Similarly the left ovary had filmy adhesions on the lateral surface near the hilum and these were lysed. The left tube distal half was surgically absent and the proximal half had formed the hydrosalpinx.  And this portion was removed. There was a 2 x 2 centimeter anterior lower uterine subserosal myoma. There was no other areas suspicious for nonpigmented endometriosis.  Description of the procedure: The patient was placed in dorsal supine position  and general endotracheal anesthesia was given. 2 g of cefazolin were given intravenously for prophylaxis. Patient was placed in lithotomy position. She was prepped and draped inside manner.  A Foley catheter was inserted into the bladder. A ZUMI catheter was placed into the uterine cavity.  This was connected to a syringe containing dilute methylene blue solution for chromotubation.  The surgeon was regloved and a surgical field was created on the abdomen.  After preemptive anesthesia of all surgical sites with 0.25% bupivacaine with 1 200,000 epinephrine, a 5 mm intraumbilical skin incision was made and a Verress needle was inserted. Its correct location was confirmed. A pneumoperitoneum was created with carbon dioxide.  5 mm laparoscope with a 30 lens was inserted and video laparoscopy was started . A left lower quadrant 5 mm and a right lower quadrant  5 mm incisions were made and ancillary trochars were placed under direct visualization. Above findings were noted.  The laparoscope was moved to the left lower quadrant port and the periumbilical omental adhesions were lysed with 55 W of cutting current on monopolar scissors.  Using a needle electrode on 19 W of cutting current, the area of fibrotic streaks in the right ovarian fossa peritoneum was excised in circumferential manner after hydrodissection was performed with the tip of the suction irrigator. Next the filmy adhesions near the hilum of both ovaries were sharply lysed. Next we found the insertion sites of the uterosacral ligaments onto the uterus somewhat fibrotic and this area was excised en bloc and submitted to pathology. Hemostasis was ensured.  The instrument and lap pad count were correct x2.  The gas was allowed to escape. The incisions were approximated with Dermabond.   The patient tolerated the procedure well and was transferred to recovery room in satisfactory condition.  Governor Specking,  MD

## 2017-08-14 NOTE — H&P (Signed)
Meghan Carter is a 37 y.o. female , originally referred to me by Dr. Glo Herring, for chronic pelvic pain, and suspected endometriosis/pelvic adhesions.  She was diagnosed with CPP and pelvic adhesions. She experienced infertility with her former partner with whom she broke up.  Patient would like to preserve her childbearing potential.  Pertinent Gynecological History: Menses: flow is excessive with use of 3 pads or tampons on heaviest days Bleeding: dysfunctional uterine bleeding Contraception: none DES exposure: denies Blood transfusions: none Sexually transmitted diseases: no past history Last pap: normal    Menstrual History: Menarche age: 87 No LMP recorded.    Past Medical History:  Diagnosis Date  . Anemia   . Anxiety   . Blood transfusion without reported diagnosis 2017   2 units, no reaction  . Depression   . Dysmenorrhea   . Endometrial polyp 02/11/2014  . Fecal occult blood test positive 01/27/2014  . History of abnormal cervical Pap smear 02/11/2014  . HSV (herpes simplex virus) infection   . Hydrosalpinx    Bilateral  . Internal hemorrhoids   . Menorrhagia with regular cycle 01/27/2014  . Pelvic pain in female 01/27/2014  . Rectal bleeding 01/27/2014  . Vaginal Pap smear, abnormal                     Past Surgical History:  Procedure Laterality Date  . COLONOSCOPY N/A 03/23/2014   SLF: 1. The examined terminal ileum appeared to be normal. 2. The left colon is redundant 3. Moderate sized internal hemorrhoids  . ECTOPIC PREGNANCY SURGERY Left 2005  . FLEXIBLE SIGMOIDOSCOPY N/A 04/01/2015   SLF: rectal bleeding due to large internal hemorrhoids 2. anemia due to rectal bleeding/memses/ poor iron intake.  Marland Kitchen FLEXIBLE SIGMOIDOSCOPY N/A 03/27/2015   Procedure: FLEXIBLE SIGMOIDOSCOPY;  Surgeon: Danie Binder, MD;  [facility];  Service: Endoscopy;  Laterality: N/A;  . HEMORRHOID BANDING N/A 03/23/2014   Procedure: HEMORRHOID BANDING;  Surgeon: Danie Binder, MD;  [facility];  Service: Endoscopy;  Laterality: N/A;  . HEMORRHOID BANDING  03/27/2015   Procedure: HEMORRHOID BANDING;  Surgeon: Danie Binder, MD;  [facility];  Service: Endoscopy;;  . HYSTEROSCOPY W/D&C N/A 06/16/2014   Procedure: DILATATION AND CURETTAGE [place];  Surgeon: Jonnie Kind, MD;  Location[facility];  Service: Gynecology;  Laterality: N/A;  . LAPAROSCOPIC UNILATERAL SALPINGECTOMY Right 06/16/2014   Procedure: LAPAROSCOPIC UNILATERAL SALPINGECTOMY;  Surgeon: Jonnie Kind, MD;  [facility];  Service: Gynecology;  Laterality: Right;  . POLYPECTOMY N/A 06/16/2014   Procedure: POLYPECTOMY (endometrial);  Surgeon: Jonnie Kind, MD;  Location: [facility];  Service: Gynecology;  Laterality: N/A;             Family History  Problem Relation Age of Onset  . Diabetes Father   . Diabetes Paternal Grandmother   . Diabetes Paternal Grandfather    No hereditary disease.  No cancer of breast, ovary, uterus. No cutaneous leiomyomatosis or renal cell carcinoma.  Social History   Socioeconomic History  . Marital status: Legally Separated    Spouse name: Not on file  . Number of children: Not on file  . Years of education: Not on file  . Highest education level: Not on file  Occupational History  . Not on file  Social Needs  . Financial resource strain: Not on file  . Food insecurity:    Worry: Not on file    Inability: Not on file  .  Transportation needs:    Medical: Not on file    Non-medical: Not on file  Tobacco Use  . Smoking status: Light Tobacco Smoker    Packs/day: 0.25    Years: 27.00    Pack years: 6.75    Types: Cigarettes  . Smokeless tobacco: Never Used  Substance and Sexual Activity  . Alcohol use: Yes    Alcohol/week: 1.2 - 1.8 oz    Types: 2 - 3 Standard drinks or equivalent per week  . Drug use: No  . Sexual activity: Never    Partners: Male    Birth control/protection: None  Lifestyle  . Physical  activity:    Days per week: Not on file    Minutes per session: Not on file  . Stress: Not on file  Relationships  . Social connections:    Talks on phone: Not on file    Gets together: Not on file    Attends religious service: Not on file    Active member of club or organization: Not on file    Attends meetings of clubs or organizations: Not on file    Relationship status: Not on file  . Intimate partner violence:    Fear of current or ex partner: Not on file    Emotionally abused: Not on file    Physically abused: Not on file    Forced sexual activity: Not on file  Other Topics Concern  . Not on file  Social History Narrative  . Not on file    Allergies  Allergen Reactions  . Other Rash    Unknown antibiotic    No current facility-administered medications on file prior to encounter.    Current Outpatient Medications on File Prior to Encounter  Medication Sig Dispense Refill  . ibuprofen (ADVIL,MOTRIN) 800 MG tablet Take 1 tablet (800 mg total) by mouth every 8 (eight) hours as needed. 30 tablet 1  . naproxen (NAPROSYN) 250 MG tablet Take by mouth 2 (two) times daily with a meal.    . UNABLE TO FIND hemclear       Review of Systems  Constitutional: Negative.   HENT: Negative.   Eyes: Negative.   Respiratory: Negative.   Cardiovascular: Negative.   Gastrointestinal: Negative.   Genitourinary: Negative.   Musculoskeletal: Negative.   Skin: Negative.   Neurological: Negative.   Endo/Heme/Allergies: Negative.   Psychiatric/Behavioral: Negative.      Physical Exam  BP 121/75   Pulse (!) 52   Temp 98.8 F (37.1 C) (Oral)   Resp 16   Ht 5' 3.5" (1.613 m)   Wt 90.7 kg (200 lb)   LMP 06/15/2017 (Exact Date) Comment: light spotting noted 08/07/2017  SpO2 100%   BMI 34.87 kg/m  Constitutional: She is oriented to person, place, and time. She appears well-developed and well-nourished.  HENT:  Head: Normocephalic and atraumatic.  Nose: Nose normal.   Mouth/Throat: Oropharynx is clear and moist. No oropharyngeal exudate.  Eyes: Conjunctivae normal and EOM are normal. Pupils are equal, round, and reactive to light. No scleral icterus.  Neck: Normal range of motion. Neck supple. No tracheal deviation present. No thyromegaly present.  Cardiovascular: Normal rate.   Respiratory: Effort normal and breath sounds normal.  GI: Soft. Bowel sounds are normal. She exhibits no distension and no mass. There is no tenderness.  Lymphadenopathy:    She has no cervical adenopathy.  Neurological: She is alert and oriented to person, place, and time. She has normal reflexes.  Skin: Skin  is warm.  Psychiatric: She has a normal mood and affect. Her behavior is normal. Judgment and thought content normal.    Assessment/Plan:  Chronic pelvic pain, pelvic adhesions, rule out endometriosis Preoperative for L/S LOa and excision of endometriosis Benefits and risks of the procedures were discussed with the patient and her family member again.  Bowel prep instructions were given.  All of patient's questions were answered.  She verbalized understanding.

## 2017-08-14 NOTE — Anesthesia Preprocedure Evaluation (Signed)
Anesthesia Evaluation  Patient identified by MRN, date of birth, ID band Patient awake    Reviewed: Allergy & Precautions, NPO status , Patient's Chart, lab work & pertinent test results  Airway Mallampati: II  TM Distance: >3 FB     Dental  (+) Teeth Intact, Dental Advisory Given   Pulmonary Current Smoker,    breath sounds clear to auscultation       Cardiovascular negative cardio ROS   Rhythm:Regular Rate:Normal     Neuro/Psych    GI/Hepatic negative GI ROS,   Endo/Other    Renal/GU      Musculoskeletal   Abdominal   Peds  Hematology   Anesthesia Other Findings   Reproductive/Obstetrics                             Anesthesia Physical  Anesthesia Plan  ASA: II  Anesthesia Plan: General   Post-op Pain Management:    Induction: Intravenous  PONV Risk Score and Plan: 2 and Ondansetron and Midazolam  Airway Management Planned: Oral ETT  Additional Equipment:   Intra-op Plan:   Post-operative Plan: Extubation in OR  Informed Consent: I have reviewed the patients History and Physical, chart, labs and discussed the procedure including the risks, benefits and alternatives for the proposed anesthesia with the patient or authorized representative who has indicated his/her understanding and acceptance.     Plan Discussed with:   Anesthesia Plan Comments:         Anesthesia Quick Evaluation

## 2017-08-14 NOTE — Discharge Instructions (Signed)
Dr Kerin Perna gave prescriptions to family  Patient has postop visit scheduled  With office  No restrictions with activity

## 2017-08-14 NOTE — Anesthesia Procedure Notes (Signed)
Procedure Name: Intubation °Performed by: Jozey Janco J, CRNA °Pre-anesthesia Checklist: Patient identified, Emergency Drugs available, Suction available, Patient being monitored and Timeout performed °Patient Re-evaluated:Patient Re-evaluated prior to induction °Oxygen Delivery Method: Circle system utilized °Preoxygenation: Pre-oxygenation with 100% oxygen °Induction Type: IV induction °Ventilation: Mask ventilation without difficulty °Laryngoscope Size: Mac and 3 °Grade View: Grade I °Tube type: Oral °Tube size: 7.0 mm °Number of attempts: 1 °Airway Equipment and Method: Stylet °Placement Confirmation: ETT inserted through vocal cords under direct vision,  positive ETCO2,  CO2 detector and breath sounds checked- equal and bilateral °Secured at: 21 cm °Tube secured with: Tape °Dental Injury: Teeth and Oropharynx as per pre-operative assessment  ° ° ° ° ° ° °

## 2017-08-14 NOTE — Transfer of Care (Signed)
Immediate Anesthesia Transfer of Care Note  Patient: Meghan Carter  Procedure(s) Performed: OPERATIVE LAPAROSCOPY ,LEFT SALPINGECTOMY, LYSIS OF ADHESIONS (N/A Abdomen) CHROMOPERTUBATION (Vagina )  Patient Location: PACU  Anesthesia Type:General  Level of Consciousness: sedated, patient cooperative and responds to stimulation  Airway & Oxygen Therapy: Patient Spontanous Breathing and Patient connected to face mask oxygen  Post-op Assessment: Report given to RN and Post -op Vital signs reviewed and stable  Post vital signs: Reviewed and stable  Last Vitals:  Vitals Value Taken Time  BP 148/87 08/14/2017  8:17 PM  Temp    Pulse 76 08/14/2017  8:18 PM  Resp 15 08/14/2017  8:18 PM  SpO2 100 % 08/14/2017  8:18 PM  Vitals shown include unvalidated device data.  Last Pain:  Vitals:   08/14/17 1354  TempSrc: Oral      Patients Stated Pain Goal: 5 (89/[id number])  Complications: No apparent anesthesia complications

## 2017-08-15 ENCOUNTER — Encounter (HOSPITAL_COMMUNITY): Payer: Self-pay | Admitting: Obstetrics and Gynecology

## 2017-08-15 LAB — ABO/RH: ABO/RH(D): A POS

## 2017-08-16 ENCOUNTER — Telehealth: Payer: Self-pay

## 2017-08-16 NOTE — Telephone Encounter (Signed)
Received a referral from [facility] for this pt to have a STAT appt for rectal bleeding. I have called Larene Pickett [phone] ext [phone]) and left VM for a return call from [place].

## 2017-08-17 NOTE — Telephone Encounter (Signed)
LMOM for pt to return call. 

## 2017-08-17 NOTE — Telephone Encounter (Signed)
I left another VM for a return call from [place].

## 2017-08-17 NOTE — Anesthesia Postprocedure Evaluation (Signed)
Anesthesia Post Note  Patient: Meghan Carter  Procedure(s) Performed: OPERATIVE LAPAROSCOPY ,LEFT SALPINGECTOMY, LYSIS OF ADHESIONS (N/A Abdomen) CHROMOPERTUBATION (Vagina )     Patient location during evaluation: PACU Anesthesia Type: General Level of consciousness: sedated and patient cooperative Pain management: pain level controlled Vital Signs Assessment: post-procedure vital signs reviewed and stable Respiratory status: spontaneous breathing Cardiovascular status: stable Anesthetic complications: no    Last Vitals:  Vitals:   08/14/17 2145 08/14/17 2155  BP: 118/74 119/76  Pulse: 66 66  Resp: 17 19  Temp: 36.6 C 36.7 C  SpO2: 99% 98%    Last Pain:  Vitals:   08/14/17 2155  TempSrc:   PainSc: [place]

## 2017-08-20 NOTE — Telephone Encounter (Signed)
I spoke to [place] at [facility]. She said she only knows the doctor said she needs STAT appt for rectal bleeding.  She has no idea what symptoms pt is having at this time. I told her the Office visit note is dated 06/13/2017. Her complaint in the visit note was pain in abdomen, back and legs, but nothing about rectal bleeding.  CBC included that showed Hemoglobin of 10.3, dated 07/25/2017. No other labs to compare. Marcie Bal is aware I have left a couple of Vm for pt to return call to find out her current situation.

## 2017-08-20 NOTE — Telephone Encounter (Signed)
LMOM to call.

## 2017-08-21 NOTE — Telephone Encounter (Signed)
Letter mailed to pt to call for apt.

## 2017-08-27 ENCOUNTER — Emergency Department (HOSPITAL_COMMUNITY)
Admission: EM | Admit: 2017-08-27 | Discharge: 2017-08-27 | Disposition: A | Discharge status: Home / Self Care | Payer: BLUE CROSS/BLUE SHIELD | Attending: Emergency Medicine | Admitting: Emergency Medicine

## 2017-08-27 ENCOUNTER — Encounter: Payer: Self-pay | Admitting: Gastroenterology

## 2017-08-27 ENCOUNTER — Emergency Department (HOSPITAL_COMMUNITY): Payer: BLUE CROSS/BLUE SHIELD

## 2017-08-27 ENCOUNTER — Other Ambulatory Visit: Payer: Self-pay

## 2017-08-27 ENCOUNTER — Encounter (HOSPITAL_COMMUNITY): Payer: Self-pay | Admitting: Emergency Medicine

## 2017-08-27 DIAGNOSIS — Z79899 Other long term (current) drug therapy: Secondary | ICD-10-CM | POA: Diagnosis not present

## 2017-08-27 DIAGNOSIS — G8918 Other acute postprocedural pain: Secondary | ICD-10-CM | POA: Diagnosis not present

## 2017-08-27 DIAGNOSIS — Z87891 Personal history of nicotine dependence: Secondary | ICD-10-CM | POA: Insufficient documentation

## 2017-08-27 DIAGNOSIS — N760 Acute vaginitis: Secondary | ICD-10-CM | POA: Insufficient documentation

## 2017-08-27 DIAGNOSIS — B373 Candidiasis of vulva and vagina: Secondary | ICD-10-CM | POA: Diagnosis not present

## 2017-08-27 DIAGNOSIS — R109 Unspecified abdominal pain: Secondary | ICD-10-CM | POA: Insufficient documentation

## 2017-08-27 DIAGNOSIS — B9689 Other specified bacterial agents as the cause of diseases classified elsewhere: Secondary | ICD-10-CM

## 2017-08-27 DIAGNOSIS — B3731 Acute candidiasis of vulva and vagina: Secondary | ICD-10-CM

## 2017-08-27 LAB — BASIC METABOLIC PANEL
ANION GAP: 8 (ref 5–15)
BUN: 12 mg/dL (ref 6–20)
CO2: 22 mmol/L (ref 22–32)
Calcium: 8.8 mg/dL — ABNORMAL LOW (ref 8.9–10.3)
Chloride: 106 mmol/L (ref 98–111)
Creatinine, Ser: 0.63 mg/dL (ref 0.44–1.00)
GLUCOSE: 92 mg/dL (ref 70–99)
POTASSIUM: 4.2 mmol/L (ref 3.5–5.1)
Sodium: 136 mmol/L (ref 135–145)

## 2017-08-27 LAB — URINALYSIS, ROUTINE W REFLEX MICROSCOPIC
Bilirubin Urine: NEGATIVE
GLUCOSE, UA: NEGATIVE mg/dL
Hgb urine dipstick: NEGATIVE
Ketones, ur: NEGATIVE mg/dL
LEUKOCYTES UA: NEGATIVE
NITRITE: NEGATIVE
PH: 7 (ref 5.0–8.0)
Protein, ur: NEGATIVE mg/dL
Specific Gravity, Urine: 1.013 (ref 1.005–1.030)

## 2017-08-27 LAB — CBC WITH DIFFERENTIAL/PLATELET
BASOS ABS: 0 10*3/uL (ref 0.0–0.1)
Basophils Relative: 1 %
Eosinophils Absolute: 0.3 10*3/uL (ref 0.0–0.7)
Eosinophils Relative: 8 %
HEMATOCRIT: 35.7 % — AB (ref 36.0–46.0)
Hemoglobin: 11.2 g/dL — ABNORMAL LOW (ref 12.0–15.0)
LYMPHS PCT: 48 %
Lymphs Abs: 2 10*3/uL (ref 0.7–4.0)
MCH: 25.5 pg — ABNORMAL LOW (ref 26.0–34.0)
MCHC: 31.4 g/dL (ref 30.0–36.0)
MCV: 81.3 fL (ref 78.0–100.0)
Monocytes Absolute: 0.4 10*3/uL (ref 0.1–1.0)
Monocytes Relative: 11 %
NEUTROS ABS: 1.3 10*3/uL — AB (ref 1.7–7.7)
Neutrophils Relative %: 32 %
Platelets: 278 10*3/uL (ref 150–400)
RBC: 4.39 MIL/uL (ref 3.87–5.11)
RDW: 16.2 % — ABNORMAL HIGH (ref 11.5–15.5)
WBC: 4.1 10*3/uL (ref 4.0–10.5)

## 2017-08-27 LAB — WET PREP, GENITAL
SPERM: NONE SEEN
TRICH WET PREP: NONE SEEN

## 2017-08-27 LAB — PREGNANCY, URINE: Preg Test, Ur: NEGATIVE

## 2017-08-27 IMAGING — CT CT ABD-PELV W/ CM
2 of 5 series · 16 of 46 positions shown, 18 images · IV contrast (Isovue)
Comparison: Ultrasound [DATE].  CT [DATE].

CLINICAL DATA: Laparoscopic surgery for endometriosis on [DATE].
Now with vaginal discharge and drainage from surgical incision.

EXAM:
CT ABDOMEN AND PELVIS WITH CONTRAST
TECHNIQUE: Multidetector CT imaging of the abdomen and pelvis was performed
using the standard protocol following bolus administration of
intravenous contrast.
CONTRAST:  100mL [2W] IOPAMIDOL ([2W]) INJECTION 61%

[Series 2: axial st · axial · 0.71mm/px · z∈[+1040,+1410]mm · 13 of 88 slices shown, 15 images]
[im 7/88  soft-tissue]
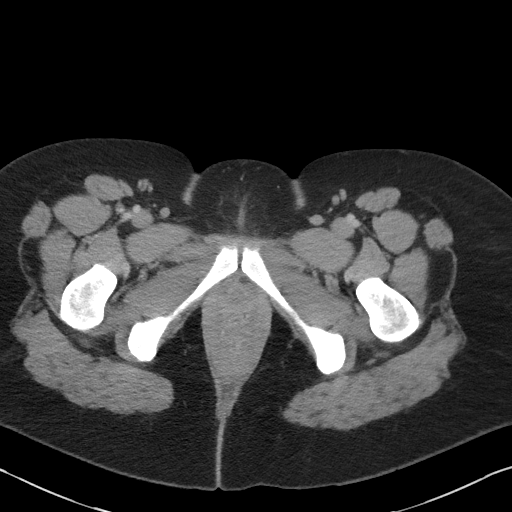
[im 7/88  bone]
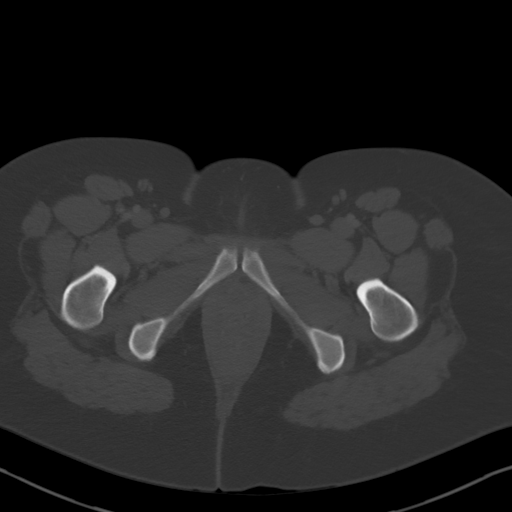
[im 13/88  soft-tissue]
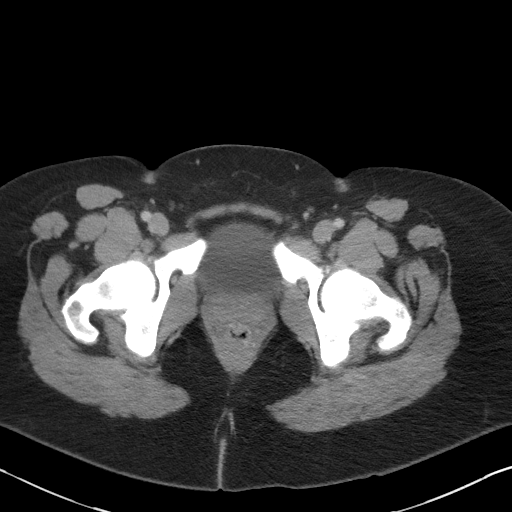
[im 19/88  soft-tissue]
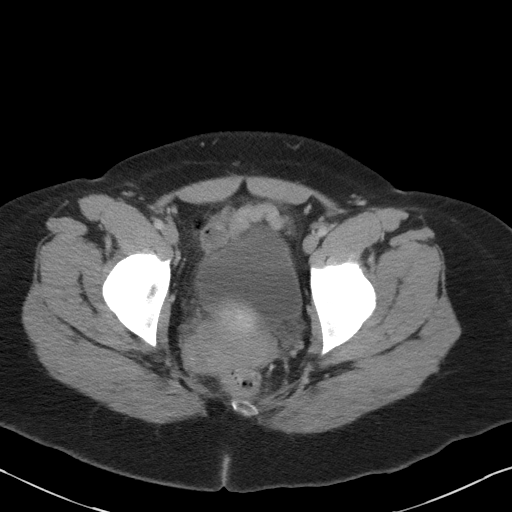
[im 25/88  soft-tissue]
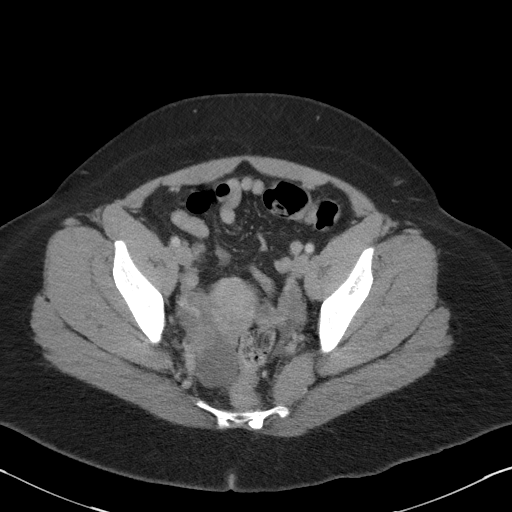
[im 32/88  soft-tissue]
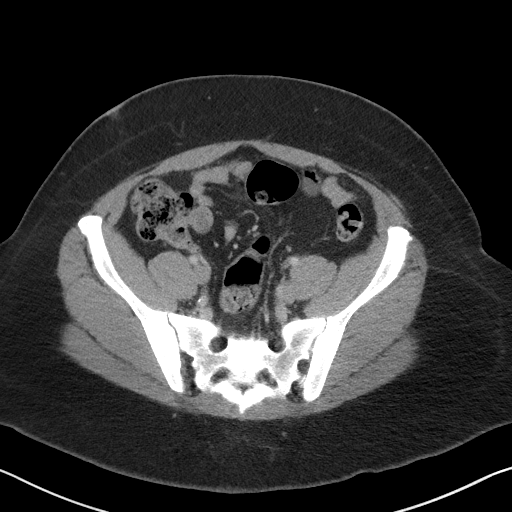
[im 38/88  soft-tissue]
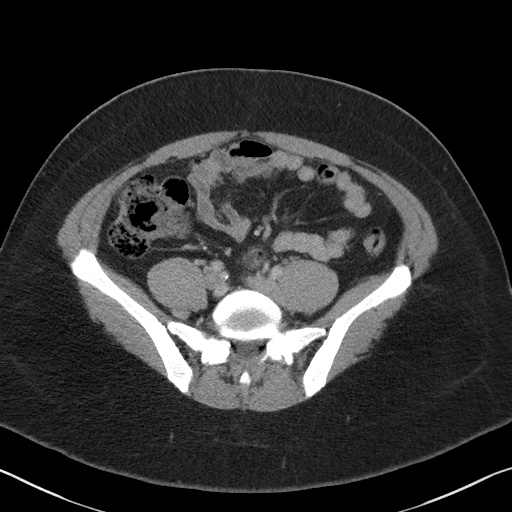
[im 44/88  soft-tissue]
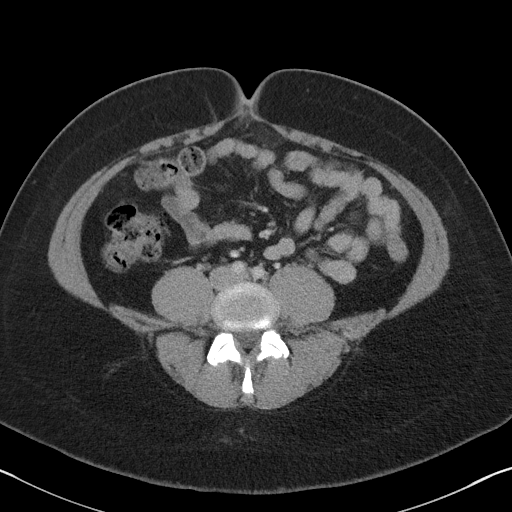
[im 50/88  soft-tissue]
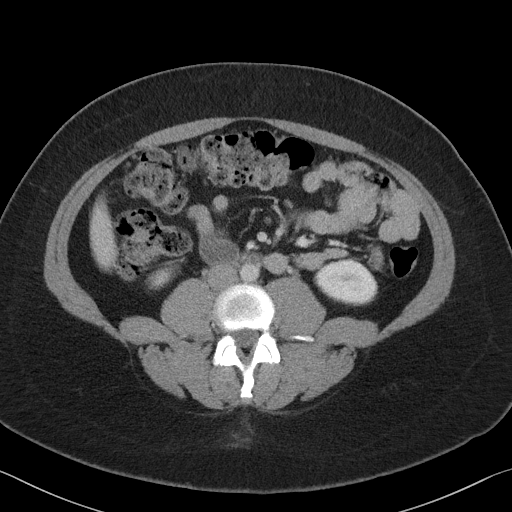
[im 56/88  soft-tissue]
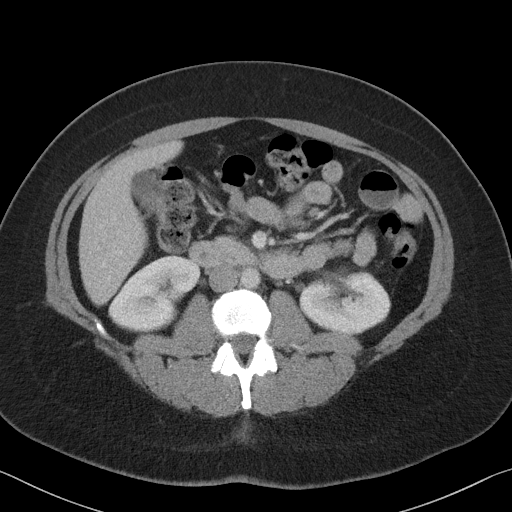
[im 56/88  bone]
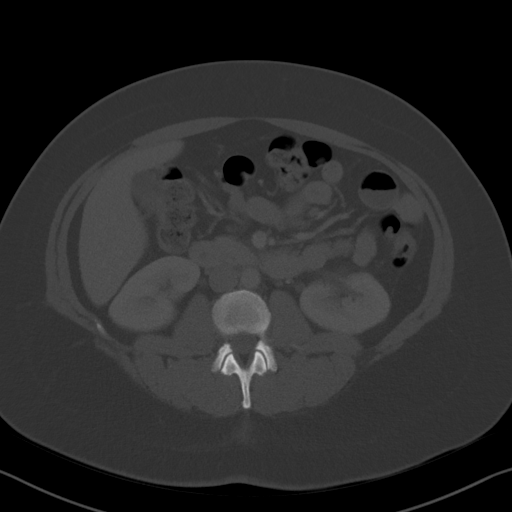
[im 63/88  soft-tissue]
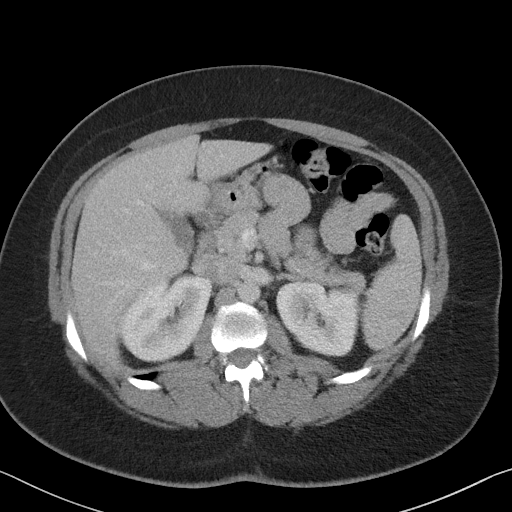
[im 69/88  soft-tissue]
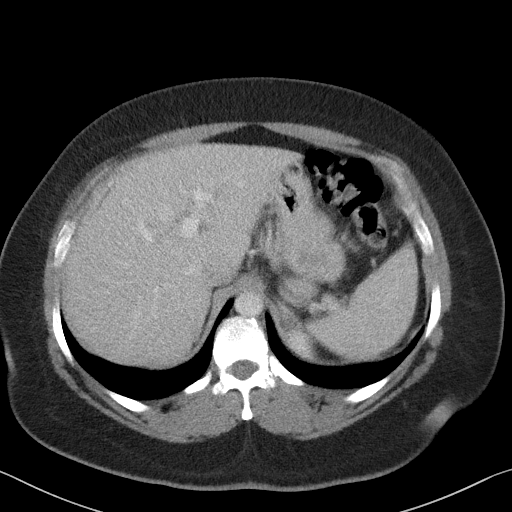
[im 75/88  soft-tissue]
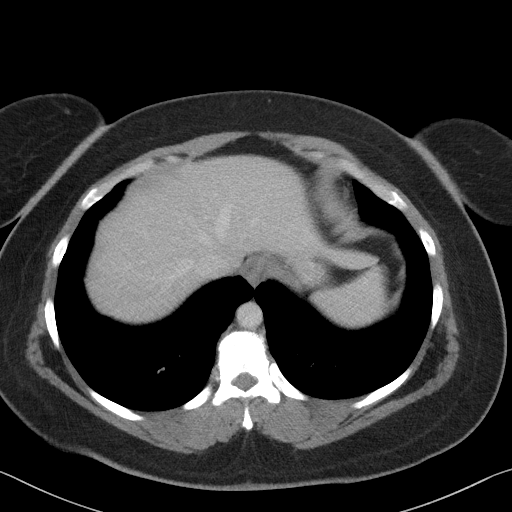
[im 81/88  soft-tissue]
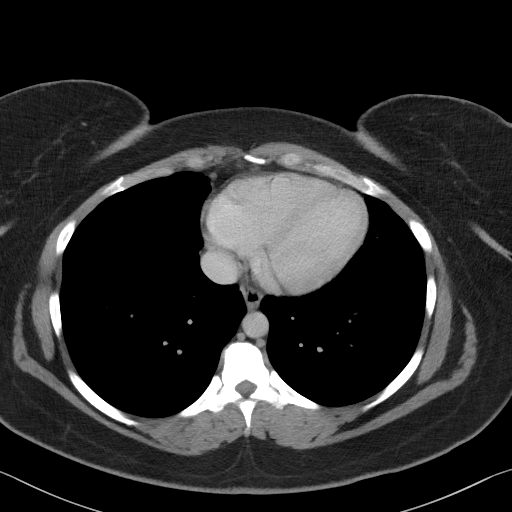

[Series 6: coronal st · coronal · 0.65mm/px · 3 of 95 slices shown]
[im 32/95  soft-tissue]
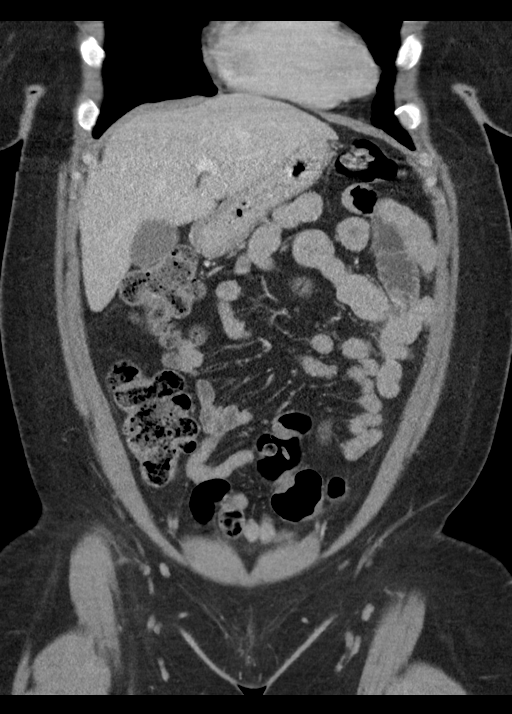
[im 42/95  soft-tissue]
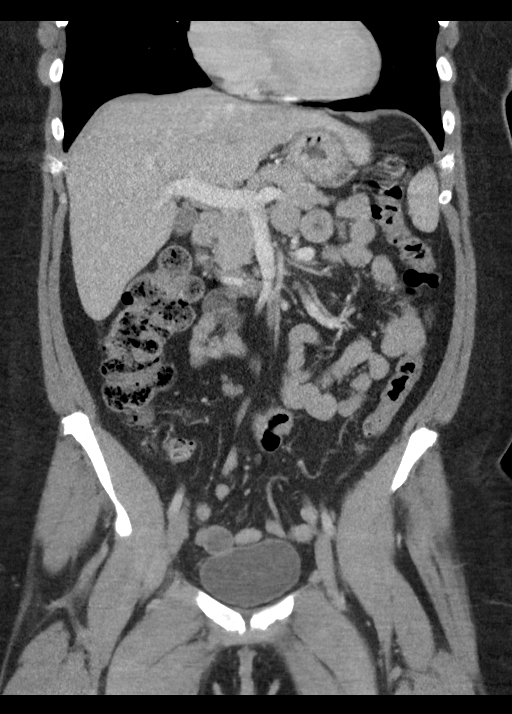
[im 53/95  soft-tissue]
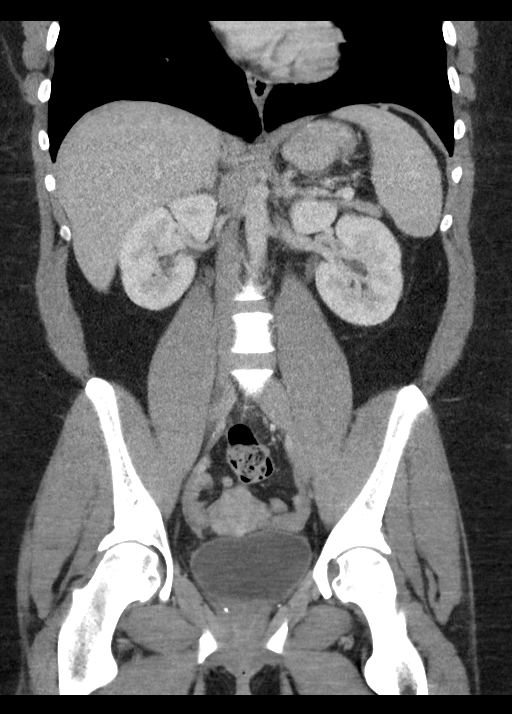

[16 of 46 positions shown; findings below may reference images not displayed]

FINDINGS: Lower chest: Lung bases are clear. No effusions. Heart is normal
size.

Hepatobiliary: No focal hepatic abnormality. Gallbladder
unremarkable.

Pancreas: No focal abnormality or ductal dilatation.

Spleen: No focal abnormality.  Normal size.

Adrenals/Urinary Tract: No adrenal abnormality. No focal renal
abnormality. No stones or hydronephrosis. Urinary bladder is
unremarkable.

Stomach/Bowel: Normal appendix. Stomach, large and small bowel
grossly unremarkable.

Vascular/Lymphatic: No evidence of aneurysm or adenopathy.

Reproductive: Small amount of free fluid within the pelvis. Small
collapsing corpus luteal cyst in the right ovary measures 1.9 cm. No
drainable abnormal fluid collection within the pelvis or
subcutaneous soft tissues. Small enhancing fibroid within the uterus
measures 2.3 cm.

Other: As above

Musculoskeletal: No acute bony abnormality.
IMPRESSION: Small amount of free fluid in the pelvis without well-defined or
walled-off fluid collection to suggest abscess. No fluid collection
in the subcutaneous soft tissues at the presumed port insertion
sites.

Small uterine fibroid.

## 2017-08-27 MED ORDER — FLUCONAZOLE 150 MG PO TABS: 150.0000 mg | ORAL_TABLET | ORAL | 0 refills | Status: DC

## 2017-08-27 MED ORDER — AZITHROMYCIN 250 MG PO TABS
1000.0000 mg | ORAL_TABLET | Freq: Once | ORAL | Status: AC
  Administered 2017-08-27: 1000 mg via ORAL
  Filled 2017-08-27: qty 4

## 2017-08-27 MED ORDER — IOPAMIDOL (ISOVUE-300) INJECTION 61%
100.0000 mL | Freq: Once | INTRAVENOUS | Status: AC | PRN
  Administered 2017-08-27: 100 mL via INTRAVENOUS

## 2017-08-27 MED ORDER — CEFTRIAXONE SODIUM 250 MG IJ SOLR
250.0000 mg | Freq: Once | INTRAMUSCULAR | Status: AC
  Administered 2017-08-27: 250 mg via INTRAMUSCULAR
  Filled 2017-08-27: qty 250

## 2017-08-27 MED ORDER — SODIUM CHLORIDE 0.9 % IV BOLUS
1000.0000 mL | Freq: Once | INTRAVENOUS | Status: AC
  Administered 2017-08-27: 1000 mL via INTRAVENOUS

## 2017-08-27 MED ORDER — METRONIDAZOLE 500 MG PO TABS: 500.0000 mg | ORAL_TABLET | Freq: Two times a day (BID) | ORAL | 0 refills | Status: DC

## 2017-08-27 MED ORDER — HYDROMORPHONE HCL 1 MG/ML IJ SOLN
1.0000 mg | Freq: Once | INTRAMUSCULAR | Status: AC
  Administered 2017-08-27: 1 mg via INTRAVENOUS
  Filled 2017-08-27: qty 1

## 2017-08-27 NOTE — ED Provider Notes (Signed)
[facility] EMERGENCY DEPARTMENT Provider Note   CSN: [id number] Arrival date & time: 08/27/17  1137     History   Chief Complaint Chief Complaint  Patient presents with  . Post-op Problem    HPI Meghan Carter is a 37 y.o. female.  HPI  37 year old female presents with concern for postop complications.  On 7/9 she had a laparoscopic lysis of adhesions, chromopertubation, and left salpingectomy.  The patient has had some abdominal discomfort since and feels like is taking a long time to heal but the abdominal pain is slightly improving.  She is concerned with the way the laparoscopy scars are healing with the glue.  However she has not seen any actual drainage.  She is been having bloody drainage since the surgery but now it is turning brown.  Since last night has had a fishy odor to her vaginal discharge.  When she first urinated this morning it hurt but when urinating just prior to me seeing her she states it was no longer painful.  She has not been having any fevers.  Over-the-counter medicines do not seem to be controlling her pain. She once had her partner perform oral sex since the surgery.   Past Medical History:  Diagnosis Date  . Anemia   . Anxiety   . Blood transfusion without reported diagnosis 2017   2 units, no reaction  . Depression   . Dysmenorrhea   . Endometrial polyp 02/11/2014  . Fecal occult blood test positive 01/27/2014  . History of abnormal cervical Pap smear 02/11/2014  . HSV (herpes simplex virus) infection   . Hydrosalpinx    Bilateral  . Internal hemorrhoids   . Menorrhagia with regular cycle 01/27/2014  . Pelvic pain in female 01/27/2014  . Rectal bleeding 01/27/2014  . Vaginal Pap smear, abnormal     Patient Active Problem List   Diagnosis Date Noted  . Acute blood loss anemia 03/26/2015  . Severe anemia   . Lower abdominal pain   . Weakness   . Other fatigue   . Rectal pain 03/19/2014  . 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 Endometrial  polyp 02/11/2014  . History of abnormal cervical Pap smear 02/11/2014  . Pelvic pain in female 01/27/2014  . Menorrhagia with regular cycle 01/27/2014  . Rectal bleeding 01/27/2014  . Fecal occult blood test positive 01/27/2014    Past Surgical History:  Procedure Laterality Date  . CHROMOPERTUBATION  08/14/2017   Procedure: CHROMOPERTUBATION;  Surgeon: Governor Specking, MD;  Location: [facility];  Service: Gynecology;;  . COLONOSCOPY N/A 03/23/2014   SLF: 1. The examined terminal ileum appeared to be normal. 2. The left colon is redundant 3. Moderate sized internal hemorrhoids  . ECTOPIC PREGNANCY SURGERY Left 2005  . FLEXIBLE SIGMOIDOSCOPY N/A 04/01/2015   SLF: rectal bleeding due to large internal hemorrhoids 2. anemia due to rectal bleeding/memses/ poor iron intake.  Marland Kitchen FLEXIBLE SIGMOIDOSCOPY N/A 03/27/2015   Procedure: FLEXIBLE SIGMOIDOSCOPY;  Surgeon: Danie Binder, MD;  [facility]: [facility];  Service: Endoscopy;  Laterality: N/A;  . HEMORRHOID BANDING N/A 03/23/2014   Procedure: HEMORRHOID BANDING;  Surgeon: Danie Binder, MD;  Location: [facility];  Service: Endoscopy;  Laterality: N/A;  . HEMORRHOID BANDING  03/27/2015   Procedure: HEMORRHOID BANDING;  Surgeon: Danie Binder, MD;  [facility]: [facility];  Service: Endoscopy;;  . HYSTEROSCOPY W/D&C N/A 06/16/2014   Procedure: DILATATION AND CURETTAGE Pollyann Glen;  Surgeon: Jonnie Kind, MD;  Location: [facility];  Service: Gynecology;  Laterality: N/A;  . LAPAROSCOPIC UNILATERAL SALPINGECTOMY Right 06/16/2014   Procedure: LAPAROSCOPIC UNILATERAL SALPINGECTOMY;  Surgeon: Jonnie Kind, MD;  Location: [facility];  Service: Gynecology;  Laterality: Right;  . LAPAROSCOPY N/A 08/14/2017   Procedure: OPERATIVE LAPAROSCOPY ,LEFT SALPINGECTOMY, LYSIS OF ADHESIONS;  Surgeon: Governor Specking, MD;  Location: [facility];  Service: Gynecology;  Laterality: N/A;  . POLYPECTOMY N/A 06/16/2014   Procedure: POLYPECTOMY (endometrial);  Surgeon: Jonnie Kind, MD;  Location: [facility];  Service: Gynecology;  Laterality: N/A;     OB History    Gravida  1   Para      Term      Preterm      AB  1   Living        SAB      TAB      Ectopic  1   Multiple      Live Births               Home Medications    Prior to Admission medications   Medication Sig Start Date End Date Taking? Authorizing Provider  CLOBETASOL PROPIONATE E 0.05 % emollient cream  07/10/17  Yes [provider]  docusate sodium (COLACE) 100 MG capsule Take 100 mg by mouth daily as needed for mild constipation.   Yes [provider]  doxepin (SINEQUAN) 10 MG capsule  08/27/17  Yes [provider]  letrozole (FEMARA) 2.5 MG tablet Take 1 tablet by mouth daily. Start at the beginning of next cycle 07/21/17  Yes [provider]  norethindrone (AYGESTIN) 5 MG tablet Take 2.5 mg by mouth daily.  07/21/17  Yes [provider]  sertraline (ZOLOFT) 100 MG tablet Take 50 mg by mouth every morning. 06/13/17  Yes [provider]  fluconazole (DIFLUCAN) 150 MG tablet Take 1 tablet (150 mg total) by mouth every 3 (three) days. 08/27/17   Sherwood Gambler, MD  ibuprofen (ADVIL,MOTRIN) 800 MG tablet Take 1 tablet (800 mg total) by mouth every 8 (eight) hours as needed. 03/02/17   Salvadore Dom, MD  metroNIDAZOLE (FLAGYL) 500 MG tablet Take 1 tablet (500 mg total) by mouth 2 (two) times daily. One po bid x 7 days 08/27/17   Sherwood Gambler, MD  UNABLE TO FIND hemclear    [provider]    Family History Family History  Problem Relation Age of Onset  . Diabetes Father   . Diabetes Paternal Grandmother   . Diabetes Paternal Grandfather     Social History Social History   Tobacco Use  . Smoking status: Light Tobacco Smoker    Packs/day: 0.25    Years: 27.00    Pack years: 6.75    Types: Cigarettes  . Smokeless tobacco: Never Used  Substance Use Topics  . Alcohol use: Yes    Alcohol/week: 1.2 -  1.8 oz    Types: 2 - 3 Standard drinks or equivalent per week  . Drug use: No     Allergies   Other   Review of Systems Review of Systems  Constitutional: Negative for fever.  Gastrointestinal: Positive for abdominal pain. Negative for vomiting.  Genitourinary: Positive for dysuria and vaginal discharge. Negative for vaginal bleeding.  All other systems reviewed and are negative.    Physical Exam Updated Vital Signs BP 115/75 (BP Location: Right Arm)   Pulse 80   Temp 98.1 F (36.7 C) (Temporal)   Resp 14   Ht 5\' 3"  (1.6 m)   Wt  90.7 kg (200 lb)   LMP 08/17/2017 (Approximate)   SpO2 100%   BMI 35.43 kg/m   Physical Exam  Constitutional: She is oriented to person, place, and time. She appears well-developed and well-nourished.  HENT:  Head: Normocephalic and atraumatic.  Right Ear: External ear normal.  Left Ear: External ear normal.  Nose: Nose normal.  Eyes: Right eye exhibits no discharge. Left eye exhibits no discharge.  Cardiovascular: Normal rate, regular rhythm and normal heart sounds.  Pulmonary/Chest: Effort normal and breath sounds normal.  Abdominal: Soft. There is generalized tenderness.    2 laparoscopy wounds appear well with glue still intact. No dehiscence or signs of local skin infection.  Genitourinary: No tenderness or bleeding in the vagina. Vaginal discharge found.  Neurological: She is alert and oriented to person, place, and time.  Skin: Skin is warm and dry.  Nursing note and vitals reviewed.    ED Treatments / Results  Labs (all labs ordered are listed, but only abnormal results are displayed) Labs Reviewed  WET PREP, GENITAL - Abnormal; Notable for the following components:      Result Value   Yeast Wet Prep HPF POC PRESENT (*)    Clue Cells Wet Prep HPF POC PRESENT (*)    WBC, Wet Prep HPF POC MODERATE (*)    All other components within normal limits  BASIC METABOLIC PANEL - Abnormal; Notable for the following components:    Calcium 8.8 (*)    All other components within normal limits  CBC WITH DIFFERENTIAL/PLATELET - Abnormal; Notable for the following components:   Hemoglobin 11.2 (*)    HCT 35.7 (*)    MCH 25.5 (*)    RDW 16.2 (*)    Neutro Abs 1.3 (*)    All other components within normal limits  URINALYSIS, ROUTINE W REFLEX MICROSCOPIC - Abnormal; Notable for the following components:   Color, Urine STRAW (*)    All other components within normal limits  PREGNANCY, URINE  GC/CHLAMYDIA PROBE AMP ([place]) NOT AT [facility]    EKG None  Radiology Ct Abdomen Pelvis W Contrast  Result Date: 08/27/2017 CLINICAL DATA:  Laparoscopic surgery for endometriosis on July 9. Now with vaginal discharge and drainage from surgical incision. EXAM: CT ABDOMEN AND PELVIS WITH CONTRAST TECHNIQUE: Multidetector CT imaging of the abdomen and pelvis was performed using the standard protocol following bolus administration of intravenous contrast. CONTRAST:  154mL ISOVUE-300 IOPAMIDOL (ISOVUE-300) INJECTION 61% COMPARISON:  Ultrasound 04/03/2017.  CT 04/30/2015. FINDINGS: Lower chest: Lung bases are clear. No effusions. Heart is normal size. Hepatobiliary: No focal hepatic abnormality. Gallbladder unremarkable. Pancreas: No focal abnormality or ductal dilatation. Spleen: No focal abnormality.  Normal size. Adrenals/Urinary Tract: No adrenal abnormality. No focal renal abnormality. No stones or hydronephrosis. Urinary bladder is unremarkable. Stomach/Bowel: Normal appendix. Stomach, large and small bowel grossly unremarkable. Vascular/Lymphatic: No evidence of aneurysm or adenopathy. Reproductive: Small amount of free fluid within the pelvis. Small collapsing corpus luteal cyst in the right ovary measures 1.9 cm. No drainable abnormal fluid collection within the pelvis or subcutaneous soft tissues. Small enhancing fibroid within the uterus measures 2.3 cm. Other: As above Musculoskeletal: No acute bony abnormality. IMPRESSION: Small  amount of free fluid in the pelvis without well-defined or walled-off fluid collection to suggest abscess. No fluid collection in the subcutaneous soft tissues at the presumed port insertion sites. Small uterine fibroid. Electronically Signed   By: Rolm Baptise M.D.   On: 08/27/2017 15:07    Procedures Procedures (  including critical care time)  Medications Ordered in ED Medications  HYDROmorphone (DILAUDID) injection 1 mg (1 mg Intravenous Given 08/27/17 1323)  sodium chloride 0.9 % bolus 1,000 mL (0 mLs Intravenous Stopped 08/27/17 1433)  iopamidol (ISOVUE-300) 61 % injection 100 mL (100 mLs Intravenous Contrast Given 08/27/17 1425)  cefTRIAXone (ROCEPHIN) injection 250 mg (250 mg Intramuscular Given 08/27/17 1544)  azithromycin (ZITHROMAX) tablet 1,000 mg (1,000 mg Oral Given 08/27/17 1543)     Initial Impression / Assessment and Plan / ED Course  I have reviewed the triage vital signs and the nursing notes.  Pertinent labs & imaging results that were available during my care of the patient were reviewed by me and considered in my medical decision making (see chart for details).     Has some general soreness in her abdomen but no focal tenderness otherwise.  Her CT scan shows no signs of an acute infection.  There is a small amount of pelvic free fluid but this is probably postsurgical and given no loculated collection or large amount of fluid with a normal WBC, no fever, no vomiting I think it is unlikely this is infectious.  As far as her vaginal discharge she has had this multiple times before and states she is always gotten an I am injection forward.  Given the yeast, clue cells, and WBCs on pelvic exam, I will treat for bacterial vaginitis, yeast, and STI.  She already has follow-up with her gynecologic surgeon in 2 days.  Discharge home with return precautions.  Final Clinical Impressions(s) / ED Diagnoses   Final diagnoses:  Post-operative pain  Bacterial vaginitis  Yeast vaginitis      ED Discharge Orders        Ordered    fluconazole (DIFLUCAN) 150 MG tablet  every 72 hours     08/27/17 1520    metroNIDAZOLE (FLAGYL) 500 MG tablet  2 times daily     08/27/17 1520       Sherwood Gambler, MD 08/27/17 1556

## 2017-08-27 NOTE — Discharge Instructions (Signed)
If your abdominal pain worsens or you develop fever, vomiting, or any other new/concerning symptoms and return to the ER for evaluation.  Otherwise follow-up with your gynecologic surgeon in 2 days as scheduled.

## 2017-08-27 NOTE — ED Triage Notes (Signed)
Pt reports having laparoscopic surgery for endometriosis on July 9th. States she is having malodorous vaginal discharge and pus pockets on her surgical incision areas.

## 2017-08-28 LAB — GC/CHLAMYDIA PROBE AMP (~~LOC~~) NOT AT ARMC
Chlamydia: NEGATIVE
NEISSERIA GONORRHEA: NEGATIVE

## 2017-09-27 ENCOUNTER — Telehealth: Payer: Self-pay

## 2017-09-27 NOTE — Telephone Encounter (Signed)
ADDED TO CANCELLATION LIST

## 2017-09-27 NOTE — Telephone Encounter (Signed)
Pt called requesting earlier appt than 11/15/2017. I told her we are scheduling out in November, so she said just leave the appt on 11/15/2017. She would like to be on a cancellation list if anything becomes available on a Monday. She goes to school on [place] and works Wed and Fri.  Forwarding to [employer] to put on cancellation list.

## 2017-11-15 ENCOUNTER — Other Ambulatory Visit: Payer: Self-pay | Admitting: *Deleted

## 2017-11-15 ENCOUNTER — Ambulatory Visit (INDEPENDENT_AMBULATORY_CARE_PROVIDER_SITE_OTHER): Payer: BLUE CROSS/BLUE SHIELD | Admitting: Gastroenterology

## 2017-11-15 ENCOUNTER — Telehealth: Payer: Self-pay | Admitting: *Deleted

## 2017-11-15 ENCOUNTER — Encounter

## 2017-11-15 ENCOUNTER — Encounter: Payer: Self-pay | Admitting: *Deleted

## 2017-11-15 ENCOUNTER — Encounter: Payer: Self-pay | Admitting: Gastroenterology

## 2017-11-15 DIAGNOSIS — K625 Hemorrhage of anus and rectum: Secondary | ICD-10-CM | POA: Diagnosis not present

## 2017-11-15 DIAGNOSIS — N939 Abnormal uterine and vaginal bleeding, unspecified: Secondary | ICD-10-CM | POA: Diagnosis not present

## 2017-11-15 MED ORDER — LIDOCAINE-HYDROCORTISONE ACE 3-2.5 % RE KIT: PACK | RECTAL | 0 refills | Status: DC

## 2017-11-15 NOTE — Assessment & Plan Note (Addendum)
SYMPTOMS NOT CONTROLLED AND LIKELY DUE TO HEMORRHOIDS. PT DID NOT SEE [facility]. 2016: TCS/HEMORRHOID BANDING 2017: FSIG W/ BANDING x2. REPORTS VAGINAL SECRETION COME THROUGH HER RECTUM.   RECENT CT ABD/PELVIS JUL 2019 REVIEWED. FOLLOW A HIGH FIBER DIET. AVOID ITEMS THAT CAUSE BLOATING & GAS.  HANDOUT GIVEN. Continue METAMUCIL TO AVOID CONSTIPATION. USE HEMORRHOID CREAM FOUR TIMES  A DAY FOR 2 WEEKS THEN WHEN NEEDED TO RELIEVE RECTAL PAIN/PRESSURE/BLEEDING. 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 DISCUSSED WITH DR Thornton Papas. SEE SURGERY TO FIX YOUR HEMORRHOIDS AND EVALUATE FOR FISTULA BETWEEN YOUR VAGINA AND RECTUM.  FOLLOW UP IN 6 MOS.

## 2017-11-15 NOTE — Telephone Encounter (Signed)
PA for CT Pelvis with contrast submitted via [employer]. PA approved. Order # [id number]. Dates 11/15/17-12/14/17

## 2017-11-15 NOTE — Progress Notes (Signed)
Subjective:    Patient ID: Meghan Carter, female    DOB: February 23, 1980, 37 y.o.   MRN: [id number]  Hiram Comber, PA-C  HPI Feels LIKE SEXUAL FLUIDS ARE COMING THROUGH HER RECTUM. WHEN SHE'S ON HER CYCLE. 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 HAVING RECTAL BLEEDING. RECTAL BLEEDING: AFTER EVERY BM, CAN BE HEAVY TO SLIGHT CLOT. BMs: EVERY DAY WITH METAMUCIL PILLS. RARE NAUSEA. GETS CONSTIPATED BUT BETTER WITH METAMUCIL FOR ABOUT A WEEK OR TWO. STOMACH DOES HURT FROM SURGERY/ENDOMETRIOSIS. Problems with sedation: REALLY COLD/SHIVERING AFTER EX LAP. MAY GET HEARTBURN: 1-2X/MO. STUDYING TO [employer].    PT DENIES FEVER, CHILLS, HEMATEMESIS,  vomiting, melena, diarrhea, CHEST PAIN, SHORTNESS OF BREATH,  CHANGE IN BOWEL IN HABITS, OR problems swallowing.  Past Medical History:  Diagnosis Date  . Anemia   . Anxiety   . Blood transfusion without reported diagnosis 2017   2 units, no reaction  . Depression   . Dysmenorrhea   . Endometrial polyp 02/11/2014  . Fecal occult blood test positive 01/27/2014  . History of abnormal cervical Pap smear 02/11/2014  . HSV (herpes simplex virus) infection   . Hydrosalpinx    Bilateral  . Internal hemorrhoids   . Menorrhagia with regular cycle 01/27/2014  . Pelvic pain in female 01/27/2014  . Rectal bleeding 01/27/2014  . Vaginal Pap smear, abnormal    Past Surgical History:  Procedure Laterality Date  . CHROMOPERTUBATION  08/14/2017   Procedure: CHROMOPERTUBATION;  Surgeon: Governor Specking, MD;  [facility]: [facility];  Service: Gynecology;;  . COLONOSCOPY N/A 03/23/2014   SLF: 1. The examined terminal ileum appeared to be normal. 2. The left colon is redundant 3. Moderate sized internal hemorrhoids  . ECTOPIC PREGNANCY SURGERY Left 2005  . FLEXIBLE SIGMOIDOSCOPY N/A 04/01/2015   SLF: rectal bleeding due to large internal hemorrhoids 2. anemia due to rectal bleeding/memses/ poor iron intake.  Marland Kitchen FLEXIBLE SIGMOIDOSCOPY N/A 03/27/2015   Procedure:  FLEXIBLE SIGMOIDOSCOPY;  Surgeon: Danie Binder, MD;  [facility];  Service: Endoscopy;  Laterality: N/A;  . HEMORRHOID BANDING N/A 03/23/2014   Procedure: HEMORRHOID BANDING;  Surgeon: Danie Binder, MD;  [facility];  Service: Endoscopy;  Laterality: N/A;  . HEMORRHOID BANDING  03/27/2015   Procedure: HEMORRHOID BANDING;  Surgeon: Danie Binder, MD;  [facility];  Service: Endoscopy;;  . HYSTEROSCOPY W/D&C N/A 06/16/2014   Procedure: DILATATION AND CURETTAGE Pollyann Glen;  Surgeon: Jonnie Kind, MD;  [facility];  Service: Gynecology;  Laterality: N/A;  . LAPAROSCOPIC UNILATERAL SALPINGECTOMY Right 06/16/2014   Procedure: LAPAROSCOPIC UNILATERAL SALPINGECTOMY;  Surgeon: Jonnie Kind, MD;  [facility]: [facility];  Service: Gynecology;  Laterality: Right;  . LAPAROSCOPY N/A 08/14/2017   Procedure: OPERATIVE LAPAROSCOPY ,LEFT SALPINGECTOMY, LYSIS OF ADHESIONS;  Surgeon: Governor Specking, MD;  [facility]: [facility];  Service: Gynecology;  Laterality: N/A;  . POLYPECTOMY N/A 06/16/2014   Procedure: POLYPECTOMY (endometrial);  Surgeon: Jonnie Kind, MD;  [facility];  Service: Gynecology;  Laterality: N/A;   Allergies  Allergen Reactions  . Other Rash    Doxycylcline    Current Outpatient Medications  Medication Sig    . NOT TAKING ANY MEDS     .      .      .      .      .      .      .      .      Marland Kitchen  Review of Systems PER HPI OTHERWISE ALL SYSTEMS ARE NEGATIVE.    Objective:   Physical Exam  Constitutional: She is oriented to person, place, and time. She appears well-developed and well-nourished. No distress.  HENT:  Head: Normocephalic and atraumatic.  Mouth/Throat: Oropharynx is clear and moist. No oropharyngeal exudate.  Eyes: Pupils are equal, round, and reactive to light. No scleral icterus.  Neck: Normal range of motion. Neck supple.  Cardiovascular: Normal rate, regular rhythm and normal heart sounds.  Pulmonary/Chest:  Effort normal and breath sounds normal. No respiratory distress.  Abdominal: Soft. Bowel sounds are normal. She exhibits no distension. There is no tenderness.  Musculoskeletal: She exhibits no edema.  Lymphadenopathy:    She has no cervical adenopathy.  Neurological: She is alert and oriented to person, place, and time.  Psychiatric: She has a normal mood and affect.  Vitals reviewed.         Assessment & Plan:

## 2017-11-15 NOTE — Patient Instructions (Signed)
DRINK WATER TO KEEP YOUR URINE LIGHT YELLOW.  FOLLOW A HIGH FIBER DIET. AVOID ITEMS THAT CAUSE BLOATING & GAS. SEE INFO BELOW.  Continue METAMUCIL TO AVOID CONSTIPATION.   USE HEMORRHOID CREAM FOUR TIMES  A DAY FOR 2 WEEKS THEN WHEN NEEDED TO RELIEVE RECTAL PAIN/PRESSURE/BLEEDING.   COMPLETE CT.  SEE SURGERY TO FIX YOUR HEMORRHOIDS AND EVALUATE FOR FISTULA BETWEEN YOUR VAGINA AND RECTUM.  FOLLOW UP IN 6 MOS.    Hemorrhoids Hemorrhoids are dilated (enlarged) veins around the rectum. Sometimes clots will form in the veins. This makes them swollen and painful. These are called thrombosed hemorrhoids.  Causes of hemorrhoids include:  Constipation.   Straining to have a bowel movement.   HEAVY LIFTING   HOME CARE INSTRUCTIONS  Eat a well balanced diet and drink 6 to 8 glasses of water every day to avoid constipation. You may also use a bulk laxative.   Avoid straining to have bowel movements.   Keep anal area dry and clean.   Do not use a donut shaped pillow or sit on the toilet for long periods. This increases blood pooling and pain.  Move your bowels when your body has the urge; this will require less straining and will decrease pain and pressure.   High-Fiber Diet A high-fiber diet changes your normal diet to include more whole grains, legumes, fruits, and vegetables. Changes in the diet involve replacing refined carbohydrates with unrefined foods. The calorie level of the diet is essentially unchanged. The Dietary Reference Intake (recommended amount) for adult males is 38 grams per day. For adult females, it is 25 grams per day. Pregnant and lactating women should consume 28 grams of fiber per day. Fiber is the intact part of a plant that is not broken down during digestion. Functional fiber is fiber that has been isolated from the plant to provide a beneficial effect in the body.  PURPOSE  Increase stool bulk.   Ease and regulate bowel movements.   Lower  cholesterol.   REDUCE RISK OF COLON CANCER  INDICATIONS THAT YOU NEED MORE FIBER  Constipation and hemorrhoids.   Uncomplicated diverticulosis (intestine condition) and irritable bowel syndrome.   Weight management.   As a protective measure against hardening of the arteries (atherosclerosis), diabetes, and cancer.   GUIDELINES FOR INCREASING FIBER IN THE DIET  Start adding fiber to the diet slowly. A gradual increase of about 5 more grams (2 slices of whole-wheat bread, 2 servings of most fruits or vegetables, or 1 bowl of high-fiber cereal) per day is best. Too rapid an increase in fiber may result in constipation, flatulence, and bloating.   Drink enough water and fluids to keep your urine clear or pale yellow. Water, juice, or caffeine-free drinks are recommended. Not drinking enough fluid may cause constipation.   Eat a variety of high-fiber foods rather than one type of fiber.   Try to increase your intake of fiber through using high-fiber foods rather than fiber pills or supplements that contain small amounts of fiber.   The goal is to change the types of food eaten. Do not supplement your present diet with high-fiber foods, but replace foods in your present diet.  INCLUDE A VARIETY OF FIBER SOURCES  Replace refined and processed grains with whole grains, canned fruits with fresh fruits, and incorporate other fiber sources. White rice, white breads, and most bakery goods contain little or no fiber.   Brown whole-grain rice, buckwheat oats, and many fruits and vegetables are all good  sources of fiber. These include: broccoli, Brussels sprouts, cabbage, cauliflower, beets, sweet potatoes, white potatoes (skin on), carrots, tomatoes, eggplant, squash, berries, fresh fruits, and dried fruits.   Cereals appear to be the richest source of fiber. Cereal fiber is found in whole grains and bran. Bran is the fiber-rich outer coat of cereal grain, which is largely removed in refining. In  whole-grain cereals, the bran remains. In breakfast cereals, the largest amount of fiber is found in those with "bran" in their names. The fiber content is sometimes indicated on the label.   You may need to include additional fruits and vegetables each day.   In baking, for 1 cup white flour, you may use the following substitutions:   1 cup whole-wheat flour minus 2 tablespoons.   1/2 cup white flour plus 1/2 cup whole-wheat flour.

## 2017-11-23 ENCOUNTER — Telehealth: Payer: Self-pay | Admitting: Gastroenterology

## 2017-11-23 NOTE — Telephone Encounter (Signed)
[employer] has faxed over some forms for the patient/student for SF to complete. I have placed them in [facility] office.

## 2017-11-23 NOTE — Telephone Encounter (Signed)
Noted  

## 2017-11-26 ENCOUNTER — Encounter: Payer: Self-pay | Admitting: Gastroenterology

## 2017-11-26 NOTE — Telephone Encounter (Signed)
FORMS COMPLETE. FAX TO [facility].

## 2017-11-26 NOTE — Telephone Encounter (Signed)
Paper work has been faxed

## 2017-11-27 NOTE — Telephone Encounter (Signed)
Faxed again and included the OV notes of 11/15/2017.

## 2017-11-29 NOTE — Progress Notes (Signed)
REVIEWED-NO ADDITIONAL RECOMMENDATIONS. 

## 2017-12-03 ENCOUNTER — Ambulatory Visit (HOSPITAL_COMMUNITY)
Admission: RE | Admit: 2017-12-03 | Discharge: 2017-12-03 | Disposition: A | Discharge status: Home / Self Care | Payer: BLUE CROSS/BLUE SHIELD | Source: Ambulatory Visit | Attending: Gastroenterology | Admitting: Gastroenterology

## 2017-12-03 DIAGNOSIS — N939 Abnormal uterine and vaginal bleeding, unspecified: Secondary | ICD-10-CM | POA: Diagnosis not present

## 2017-12-03 IMAGING — CT CT PELVIS W/ CM
2 of 4 series · 15 of 46 positions shown, 17 images · IV contrast (iopamidol)
Comparison: [DATE]

CLINICAL DATA: Laparoscopic surgery for endometriosis on [REDACTED].
Evaluate for vaginal rectal fistula.

EXAM:
CT PELVIS WITH CONTRAST
TECHNIQUE: Multidetector CT imaging of the pelvis was performed using the
standard protocol following the bolus administration of intravenous
contrast.
CONTRAST:  100mL [Y0] IOPAMIDOL ([Y0]) INJECTION 61%

[Series 2: axial st · axial · 0.81mm/px · z∈[-599,-389]mm · 12 of 50 slices shown, 14 images]
[im 4/50  soft-tissue]
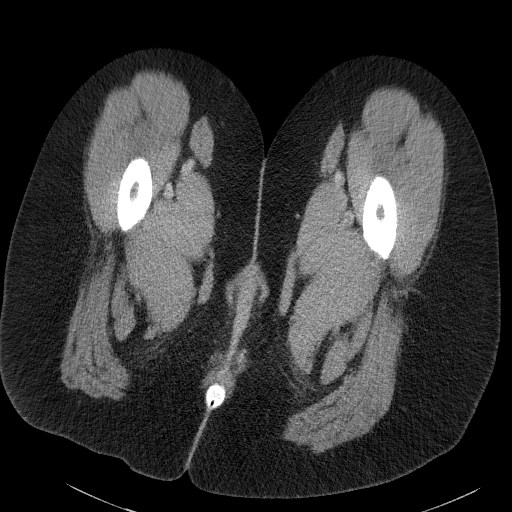
[im 4/50  bone]
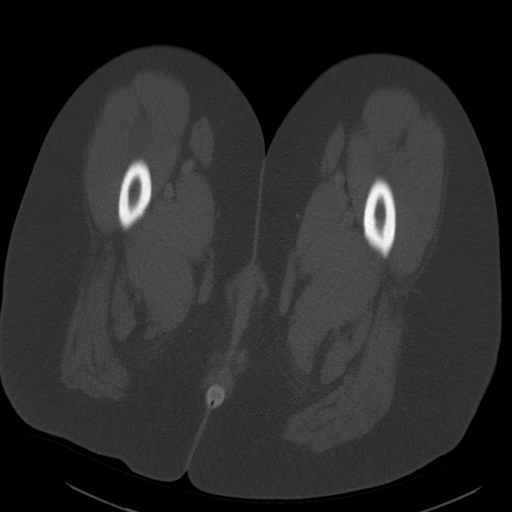
[im 7/50  soft-tissue]
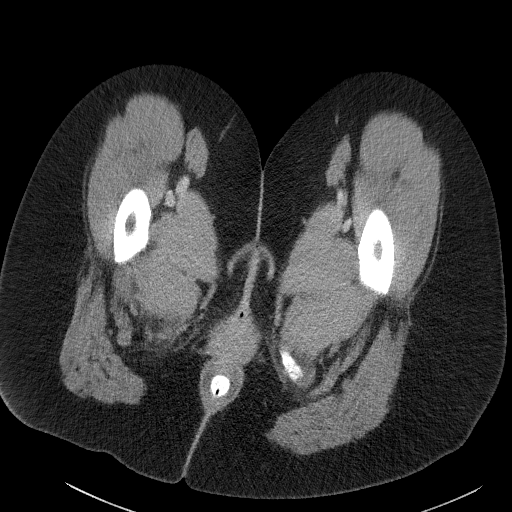
[im 10/50  soft-tissue]
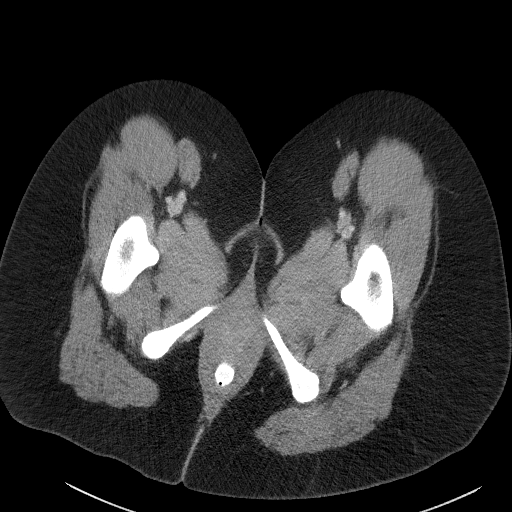
[im 17/50  soft-tissue]
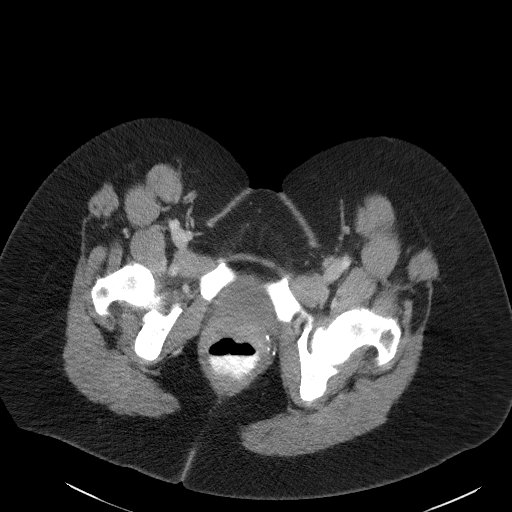
[im 20/50  soft-tissue]
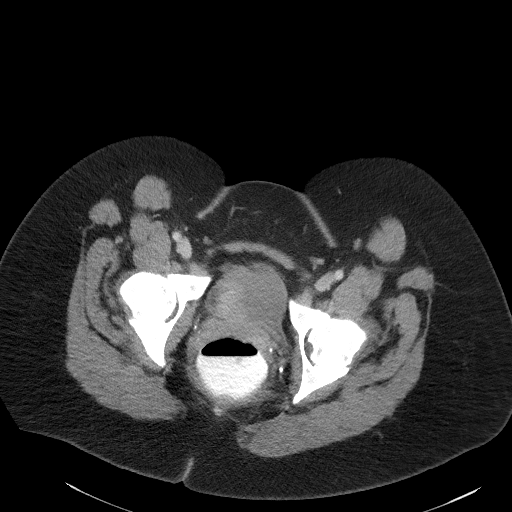
[im 23/50  soft-tissue]
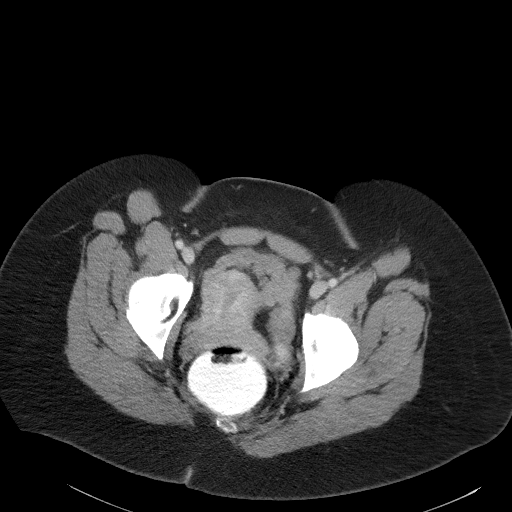
[im 27/50  soft-tissue]
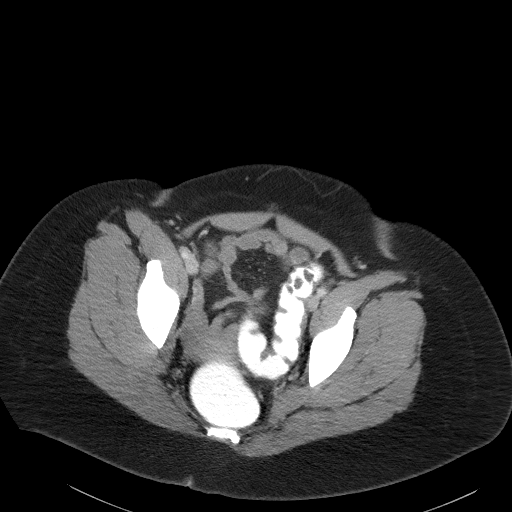
[im 30/50  soft-tissue]
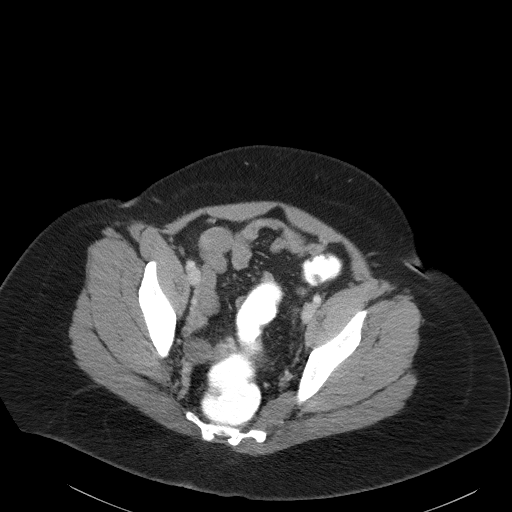
[im 33/50  soft-tissue]
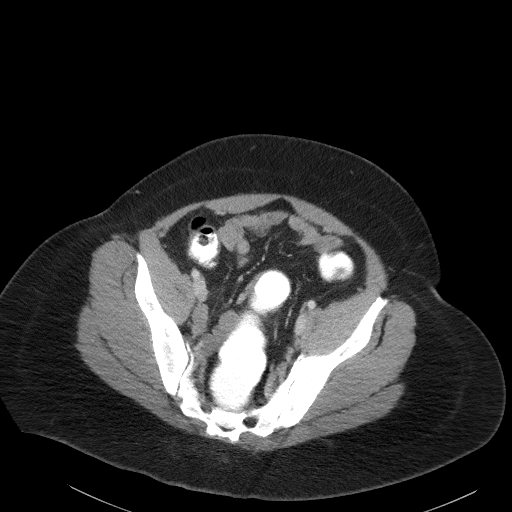
[im 33/50  bone]
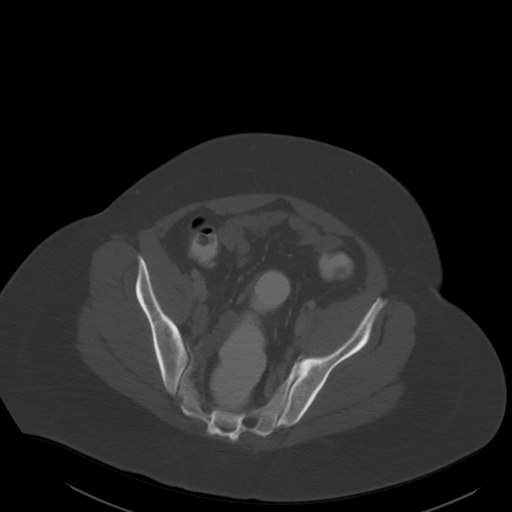
[im 40/50  soft-tissue]
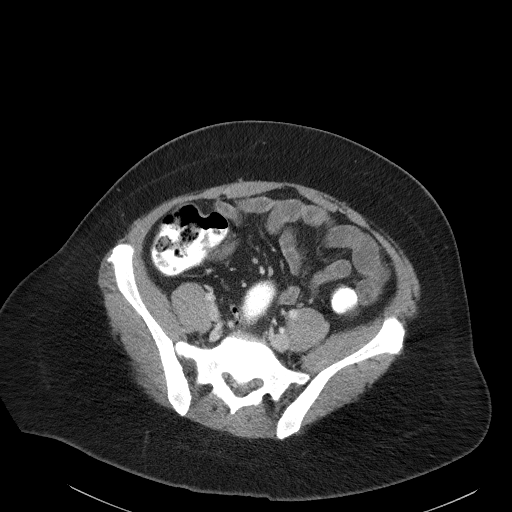
[im 43/50  soft-tissue]
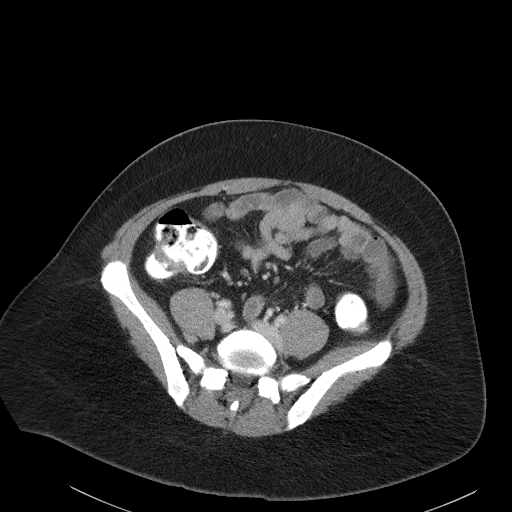
[im 46/50  soft-tissue]
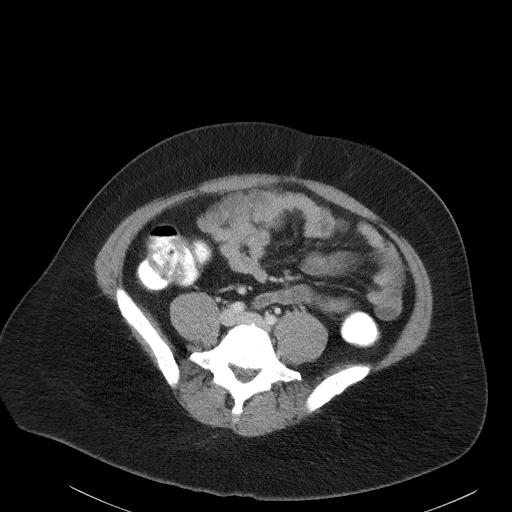

[Series 5: coronal st · coronal · 0.48mm/px · 3 of 102 slices shown]
[im 34/102  soft-tissue]
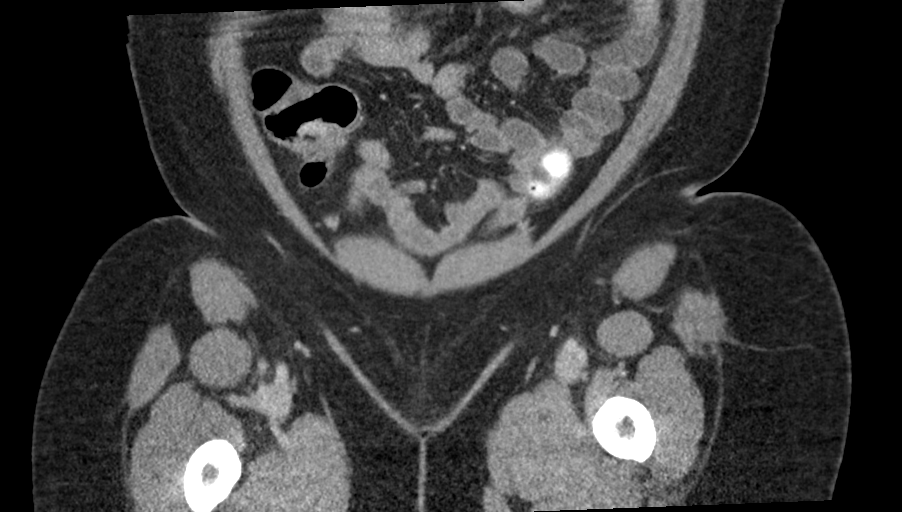
[im 45/102  soft-tissue]
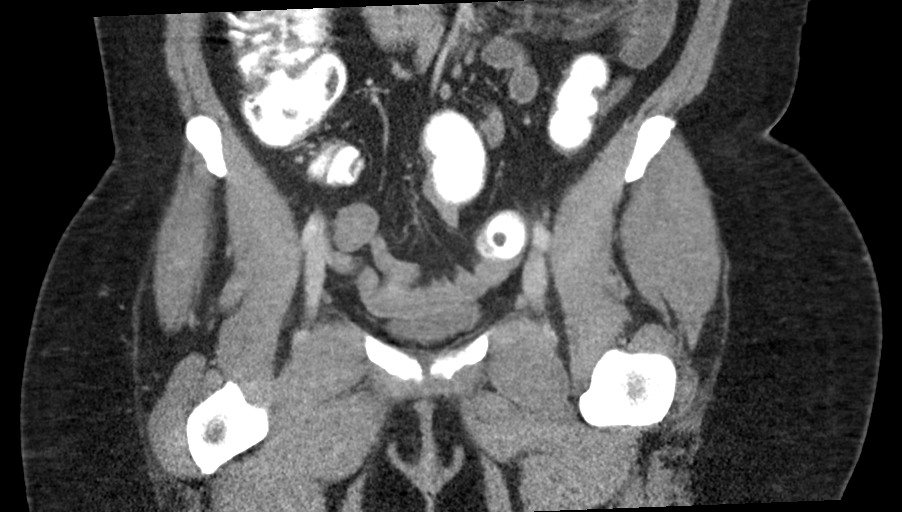
[im 57/102  soft-tissue]
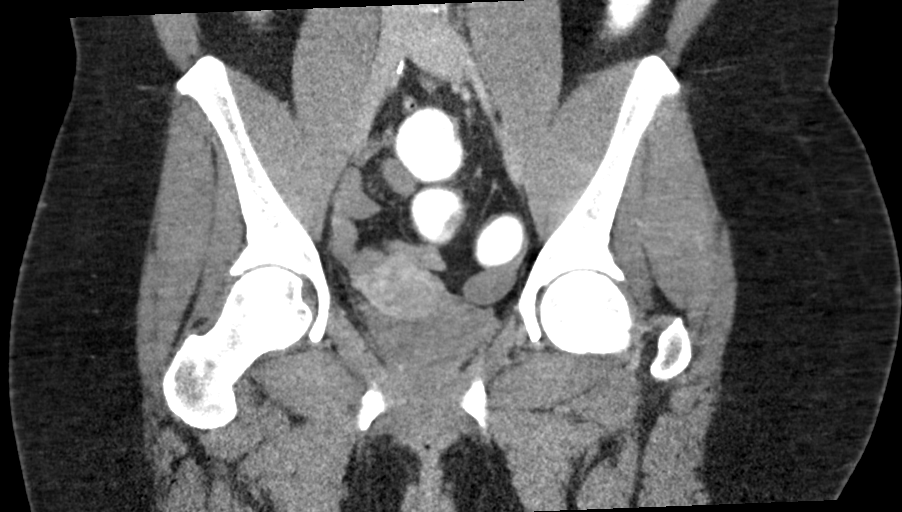

[15 of 46 positions shown; findings below may reference images not displayed]

FINDINGS: Urinary Tract: No distal hydroureter. Decompressed urinary bladder,
without gas within.

Bowel: Contrast opacifies the rectum and remainder of the imaged
colon. No evidence of fistulous communication to the vagina. No
pericolonic inflammation. Normal pelvic small bowel loops, terminal
ileum, and appendix.

Vascular/Lymphatic: No pelvic aneurysm or sidewall adenopathy. There
is right common and internal iliac calcified atherosclerosis.

Reproductive: Normal uterus. No gas or contrast identified within
the vagina. No adnexal mass.

Other:  No significant free fluid.  No free intraperitoneal air.

Musculoskeletal: No acute osseous abnormality.
IMPRESSION: 1. No evidence of rectovaginal fistula. No acute complication after
laparoscopic surgery.
2. Atherosclerosis within the pelvic vasculature, age advanced.

## 2017-12-03 MED ORDER — IOPAMIDOL (ISOVUE-300) INJECTION 61%
100.0000 mL | Freq: Once | INTRAVENOUS | Status: AC | PRN
  Administered 2017-12-03: 100 mL via INTRAVENOUS

## 2017-12-04 ENCOUNTER — Telehealth: Payer: Self-pay | Admitting: Gastroenterology

## 2017-12-04 NOTE — Telephone Encounter (Signed)
PLEASE CALL PT. HER CT DOES NOT SHOW A CONNECTION BETWEEN HER VAGINA AND RECTUM. SEE SURGERY AND HER GYN TO EVALUATE FOR FISTULA BETWEEN YOUR VAGINA AND RECTUM AND O DISCUSS HER VAGINAL COMPLAINTS. THEY ARE NOT RELATED TO HER GI TRACT.Marland Kitchen

## 2017-12-05 NOTE — Telephone Encounter (Signed)
PT is aware of results and plan. She is scheduled to see Dr. Arnoldo Morale 12/20/2017.  She will let us know if she needs a referral to GYN, said it has been awhile since she saw them. She wants to schedule an appt with Dr. Oneida Alar to come in and go over the results, said she needs to be showed. I told her I will have to check with Dr. Oneida Alar first.

## 2017-12-05 NOTE — Telephone Encounter (Signed)
LMOM to call.

## 2017-12-05 NOTE — Telephone Encounter (Signed)
SPOKE TO PT. EXPLAINED CT FINDINGS. NO FISTULA SEEN. SEE GYN OR GENERAL SURGERY IF HER COMPLAINT THAT VAGINAL SECRETIONS ARE COMING OUT OF HER RECTUM. SEE GENERAL SURGERY TO MANAGE HEMORRHOIDS.

## 2017-12-20 ENCOUNTER — Encounter: Payer: Self-pay | Admitting: General Surgery

## 2017-12-20 ENCOUNTER — Ambulatory Visit (INDEPENDENT_AMBULATORY_CARE_PROVIDER_SITE_OTHER): Payer: BLUE CROSS/BLUE SHIELD | Admitting: General Surgery

## 2017-12-20 ENCOUNTER — Encounter (INDEPENDENT_AMBULATORY_CARE_PROVIDER_SITE_OTHER): Payer: Self-pay

## 2017-12-20 VITALS — BP 147/67 | HR 90 | Temp 97.5°F | Resp 16 | Wt 203.8 lb

## 2017-12-20 DIAGNOSIS — K644 Residual hemorrhoidal skin tags: Secondary | ICD-10-CM | POA: Diagnosis not present

## 2017-12-20 DIAGNOSIS — K648 Other hemorrhoids: Secondary | ICD-10-CM

## 2017-12-20 NOTE — Progress Notes (Signed)
Meghan Carter; [id number]; August 08, 1980   HPI Patient is a 37 year old black female who was referred to my care by Dr. Oneida Alar for evaluation treatment of rectal bleeding as well as a rectal discharge.  Patient has had a long-standing history of pelvic pain.  She underwent a laparoscopy with lysis of adhesions, excision of peritoneal endometriosis, and left salpingectomy in July of this year.  She has since had multiple evaluations for various complaints of pelvic pain and incisional pain.  She states that she is also had a discharge of vaginal secretions through her rectum.  She did undergo a CT scan of the pelvis with rectal contrast which did not reveal any colovaginal fistula.  She also states that intermittently she does have blood per rectum due to hemorrhoids.  She has had hemorrhoidal banding in the past.  She has 6 out of 10 rectal pain.  She has not seen her gynecologist for follow-up concerning her complaint of vaginal discharge per rectum.  She denies passing stool or air through her vagina.  Her last flexible sigmoidoscopy was in 2017. Past Medical History:  Diagnosis Date  . Anemia   . Anxiety   . Blood transfusion without reported diagnosis 2017   2 units, no reaction  . Depression   . Dysmenorrhea   . Endometrial polyp 02/11/2014  . Fecal occult blood test positive 01/27/2014  . History of abnormal cervical Pap smear 02/11/2014  . HSV (herpes simplex virus) infection   . Hydrosalpinx    Bilateral  . Internal hemorrhoids   . Menorrhagia with regular cycle 01/27/2014  . Pelvic pain in female 01/27/2014  . Rectal bleeding 01/27/2014  . Vaginal Pap smear, abnormal     Past Surgical History:  Procedure Laterality Date  . CHROMOPERTUBATION  08/14/2017   Procedure: CHROMOPERTUBATION;  Surgeon: Governor Specking, MD;  Location: [facility];  Service: Gynecology;;  . COLONOSCOPY N/A 03/23/2014   SLF: 1. The examined terminal ileum appeared to be normal. 2. The left colon is redundant 3.  Moderate sized internal hemorrhoids  . ECTOPIC PREGNANCY SURGERY Left 2005  . FLEXIBLE SIGMOIDOSCOPY N/A 04/01/2015   SLF: rectal bleeding due to large internal hemorrhoids 2. anemia due to rectal bleeding/memses/ poor iron intake.  Marland Kitchen FLEXIBLE SIGMOIDOSCOPY N/A 03/27/2015   Procedure: FLEXIBLE SIGMOIDOSCOPY;  Surgeon: Danie Binder, MD;  Location: [facility];  Service: Endoscopy;  Laterality: N/A;  . HEMORRHOID BANDING N/A 03/23/2014   Procedure: HEMORRHOID BANDING;  Surgeon: Danie Binder, MD;  Location: [facility];  Service: Endoscopy;  Laterality: N/A;  . HEMORRHOID BANDING  03/27/2015   Procedure: HEMORRHOID BANDING;  Surgeon: Danie Binder, MD;  Location: [facility];  Service: Endoscopy;;  . HYSTEROSCOPY W/D&C N/A 06/16/2014   Procedure: DILATATION AND CURETTAGE Pollyann Glen;  Surgeon: Jonnie Kind, MD;  Location: [facility];  Service: Gynecology;  Laterality: N/A;  . LAPAROSCOPIC UNILATERAL SALPINGECTOMY Right 06/16/2014   Procedure: LAPAROSCOPIC UNILATERAL SALPINGECTOMY;  Surgeon: Jonnie Kind, MD;  Location: [facility];  Service: Gynecology;  Laterality: Right;  . LAPAROSCOPY N/A 08/14/2017   Procedure: OPERATIVE LAPAROSCOPY ,LEFT SALPINGECTOMY, LYSIS OF ADHESIONS;  Surgeon: Governor Specking, MD;  Location: [facility];  Service: Gynecology;  Laterality: N/A;  . POLYPECTOMY N/A 06/16/2014   Procedure: POLYPECTOMY (endometrial);  Surgeon: Jonnie Kind, MD;  Location: [facility];  Service: Gynecology;  Laterality: N/A;    Family History  Problem Relation Age of Onset  . Diabetes Father   . Diabetes Paternal Grandmother   .  Diabetes Paternal Grandfather     Current Outpatient Medications on File Prior to Visit  Medication Sig Dispense Refill  . CLOBETASOL PROPIONATE E 0.05 % emollient cream   0  . docusate sodium (COLACE) 100 MG capsule Take 100 mg by mouth daily as needed for mild constipation.    Marland Kitchen doxepin (SINEQUAN) 10 MG capsule   0  . letrozole (FEMARA) 2.5 MG tablet Take 1  tablet by mouth daily. Start at the beginning of next cycle  2  . Lidocaine-Hydrocortisone Ace 3-2.5 % KIT APPLY TO RECTUM QID FOR 2 WEEKS 1 each 0  . norethindrone (AYGESTIN) 5 MG tablet Take 2.5 mg by mouth daily.   2  . sertraline (ZOLOFT) 100 MG tablet Take 50 mg by mouth as needed.   2  . UNABLE TO FIND hemclear     No current facility-administered medications on file prior to visit.     Allergies  Allergen Reactions  . Other Rash    Doxycylcline     Social History   Substance and Sexual Activity  Alcohol Use Yes   Comment: "very little"    Social History   Tobacco Use  Smoking Status Light Tobacco Smoker  . Packs/day: 0.25  . Years: 27.00  . Pack years: 6.75  . Types: Cigarettes  Smokeless Tobacco Never Used    Review of Systems  Constitutional: Positive for chills, fever and malaise/fatigue.  HENT: Positive for sinus pain.   Eyes: Positive for blurred vision and pain.  Respiratory: Positive for cough.   Cardiovascular: Negative.   Gastrointestinal: Positive for abdominal pain and heartburn.  Genitourinary: Positive for frequency.  Musculoskeletal: Positive for back pain, joint pain and neck pain.  Skin: Negative.   Neurological: Negative.   Endo/Heme/Allergies: Negative.   Psychiatric/Behavioral: The patient is nervous/anxious.     Objective   Vitals:   12/20/17 1014  BP: (!) 147/67  Pulse: 90  Resp: 16  Temp: (!) 97.5 F (36.4 C)    Physical Exam  Constitutional: She is oriented to person, place, and time. She appears well-developed and well-nourished. No distress.  HENT:  Head: Normocephalic and atraumatic.  Cardiovascular: Normal rate, regular rhythm and normal heart sounds. Exam reveals no gallop and no friction rub.  No murmur heard. Pulmonary/Chest: Effort normal and breath sounds normal. No stridor. No respiratory distress. She has no wheezes. She has no rales.  Abdominal: Soft. Bowel sounds are normal. She exhibits no distension and no  mass. There is no tenderness. There is no rebound and no guarding. No hernia.  Genitourinary:  Genitourinary Comments: Rectal examination reveals normal sphincter tone, and internal hemorrhoid along the left side of the anus.  Mild prolapsing is noted.  No significant blood per rectum noted.  No other discharge noted.  Neurological: She is alert and oriented to person, place, and time.  Skin: Skin is warm and dry.  Vitals reviewed. Previous operative notes reviewed.  CT scan images personally reviewed.  Dr. Oneida Alar notes reviewed.  Assessment  Blood per rectum secondary to hemorrhoidal disease No colorectal vaginal fistula appreciated on examination Plan   I extensively reviewed the CT scan results with the patient.  I told her that there is no indication at this time for operative intervention concerning her complaint of vaginal discharge per rectum.  I told her that she would need to follow-up with her gynecologist for pelvic examination should this continue.  She is scheduled for hemorrhoidectomy on 01/11/2018.  The risks and benefits of the  procedure including bleeding, infection, and recurrence of the hemorrhoidal disease were fully explained to the patient, who gave informed consent.

## 2017-12-20 NOTE — H&P (Signed)
Meghan Carter; [id number]; May 20, 1980   HPI Patient is a 37 year old black female who was referred to my care by Dr. Oneida Alar for evaluation treatment of rectal bleeding as well as a rectal discharge.  Patient has had a long-standing history of pelvic pain.  She underwent a laparoscopy with lysis of adhesions, excision of peritoneal endometriosis, and left salpingectomy in July of this year.  She has since had multiple evaluations for various complaints of pelvic pain and incisional pain.  She states that she is also had a discharge of vaginal secretions through her rectum.  She did undergo a CT scan of the pelvis with rectal contrast which did not reveal any colovaginal fistula.  She also states that intermittently she does have blood per rectum due to hemorrhoids.  She has had hemorrhoidal banding in the past.  She has 6 out of 10 rectal pain.  She has not seen her gynecologist for follow-up concerning her complaint of vaginal discharge per rectum.  She denies passing stool or air through her vagina.  Her last flexible sigmoidoscopy was in 2017. Past Medical History:  Diagnosis Date  . Anemia   . Anxiety   . Blood transfusion without reported diagnosis 2017   2 units, no reaction  . Depression   . Dysmenorrhea   . Endometrial polyp 02/11/2014  . Fecal occult blood test positive 01/27/2014  . History of abnormal cervical Pap smear 02/11/2014  . HSV (herpes simplex virus) infection   . Hydrosalpinx    Bilateral  . Internal hemorrhoids   . Menorrhagia with regular cycle 01/27/2014  . Pelvic pain in female 01/27/2014  . Rectal bleeding 01/27/2014  . Vaginal Pap smear, abnormal     Past Surgical History:  Procedure Laterality Date  . CHROMOPERTUBATION  08/14/2017   Procedure: CHROMOPERTUBATION;  Surgeon: Governor Specking, MD;  Location: [facility];  Service: Gynecology;;  . COLONOSCOPY N/A 03/23/2014   SLF: 1. The examined terminal ileum appeared to be normal. 2. The left colon is redundant 3.  Moderate sized internal hemorrhoids  . ECTOPIC PREGNANCY SURGERY Left 2005  . FLEXIBLE SIGMOIDOSCOPY N/A 04/01/2015   SLF: rectal bleeding due to large internal hemorrhoids 2. anemia due to rectal bleeding/memses/ poor iron intake.  Marland Kitchen FLEXIBLE SIGMOIDOSCOPY N/A 03/27/2015   Procedure: FLEXIBLE SIGMOIDOSCOPY;  Surgeon: Danie Binder, MD;  [facility];  Service: Endoscopy;  Laterality: N/A;  . HEMORRHOID BANDING N/A 03/23/2014   Procedure: HEMORRHOID BANDING;  Surgeon: Danie Binder, MD;  [facility];  Service: Endoscopy;  Laterality: N/A;  . HEMORRHOID BANDING  03/27/2015   Procedure: HEMORRHOID BANDING;  Surgeon: Danie Binder, MD;  [facility];  Service: Endoscopy;;  . HYSTEROSCOPY W/D&C N/A 06/16/2014   Procedure: DILATATION AND CURETTAGE Pollyann Glen;  Surgeon: Jonnie Kind, MD;  Location: [facility];  Service: Gynecology;  Laterality: N/A;  . LAPAROSCOPIC UNILATERAL SALPINGECTOMY Right 06/16/2014   Procedure: LAPAROSCOPIC UNILATERAL SALPINGECTOMY;  Surgeon: Jonnie Kind, MD;  Location: [facility];  Service: Gynecology;  Laterality: Right;  . LAPAROSCOPY N/A 08/14/2017   Procedure: OPERATIVE LAPAROSCOPY ,LEFT SALPINGECTOMY, LYSIS OF ADHESIONS;  Surgeon: Governor Specking, MD;  Location: [facility];  Service: Gynecology;  Laterality: N/A;  . POLYPECTOMY N/A 06/16/2014   Procedure: POLYPECTOMY (endometrial);  Surgeon: Jonnie Kind, MD;  Location: [facility];  Service: Gynecology;  Laterality: N/A;    Family History  Problem Relation Age of Onset  . Diabetes Father   . Diabetes Paternal Grandmother   .  Diabetes Paternal Grandfather     Current Outpatient Medications on File Prior to Visit  Medication Sig Dispense Refill  . CLOBETASOL PROPIONATE E 0.05 % emollient cream   0  . docusate sodium (COLACE) 100 MG capsule Take 100 mg by mouth daily as needed for mild constipation.    Marland Kitchen doxepin (SINEQUAN) 10 MG capsule   0  . letrozole (FEMARA) 2.5 MG tablet Take 1  tablet by mouth daily. Start at the beginning of next cycle  2  . Lidocaine-Hydrocortisone Ace 3-2.5 % KIT APPLY TO RECTUM QID FOR 2 WEEKS 1 each 0  . norethindrone (AYGESTIN) 5 MG tablet Take 2.5 mg by mouth daily.   2  . sertraline (ZOLOFT) 100 MG tablet Take 50 mg by mouth as needed.   2  . UNABLE TO FIND hemclear     No current facility-administered medications on file prior to visit.     Allergies  Allergen Reactions  . Other Rash    Doxycylcline     Social History   Substance and Sexual Activity  Alcohol Use Yes   Comment: "very little"    Social History   Tobacco Use  Smoking Status Light Tobacco Smoker  . Packs/day: 0.25  . Years: 27.00  . Pack years: 6.75  . Types: Cigarettes  Smokeless Tobacco Never Used    Review of Systems  Constitutional: Positive for chills, fever and malaise/fatigue.  HENT: Positive for sinus pain.   Eyes: Positive for blurred vision and pain.  Respiratory: Positive for cough.   Cardiovascular: Negative.   Gastrointestinal: Positive for abdominal pain and heartburn.  Genitourinary: Positive for frequency.  Musculoskeletal: Positive for back pain, joint pain and neck pain.  Skin: Negative.   Neurological: Negative.   Endo/Heme/Allergies: Negative.   Psychiatric/Behavioral: The patient is nervous/anxious.     Objective   Vitals:   12/20/17 1014  BP: (!) 147/67  Pulse: 90  Resp: 16  Temp: (!) 97.5 F (36.4 C)    Physical Exam  Constitutional: She is oriented to person, place, and time. She appears well-developed and well-nourished. No distress.  HENT:  Head: Normocephalic and atraumatic.  Cardiovascular: Normal rate, regular rhythm and normal heart sounds. Exam reveals no gallop and no friction rub.  No murmur heard. Pulmonary/Chest: Effort normal and breath sounds normal. No stridor. No respiratory distress. She has no wheezes. She has no rales.  Abdominal: Soft. Bowel sounds are normal. She exhibits no distension and no  mass. There is no tenderness. There is no rebound and no guarding. No hernia.  Genitourinary:  Genitourinary Comments: Rectal examination reveals normal sphincter tone, and internal hemorrhoid along the left side of the anus.  Mild prolapsing is noted.  No significant blood per rectum noted.  No other discharge noted.  Neurological: She is alert and oriented to person, place, and time.  Skin: Skin is warm and dry.  Vitals reviewed. Previous operative notes reviewed.  CT scan images personally reviewed.  Dr. Oneida Alar notes reviewed.  Assessment  Blood per rectum secondary to hemorrhoidal disease No colorectal vaginal fistula appreciated on examination Plan   I extensively reviewed the CT scan results with the patient.  I told her that there is no indication at this time for operative intervention concerning her complaint of vaginal discharge per rectum.  I told her that she would need to follow-up with her gynecologist for pelvic examination should this continue.  She is scheduled for hemorrhoidectomy on 01/11/2018.  The risks and benefits of the  procedure including bleeding, infection, and recurrence of the hemorrhoidal disease were fully explained to the patient, who gave informed consent.

## 2017-12-20 NOTE — Patient Instructions (Signed)
Hemorrhoids Hemorrhoids are swollen veins in and around the rectum or anus. There are two types of hemorrhoids:  Internal hemorrhoids. These occur in the veins that are just inside the rectum. They may poke through to the outside and become irritated and painful.  External hemorrhoids. These occur in the veins that are outside of the anus and can be felt as a painful swelling or hard lump near the anus.  Most hemorrhoids do not cause serious problems, and they can be managed with home treatments such as diet and lifestyle changes. If home treatments do not help your symptoms, procedures can be done to shrink or remove the hemorrhoids. What are the causes? This condition is caused by increased pressure in the anal area. This pressure may result from various things, including:  Constipation.  Straining to have a bowel movement.  Diarrhea.  Pregnancy.  Obesity.  Sitting for long periods of time.  Heavy lifting or other activity that causes you to strain.  Anal sex.  What are the signs or symptoms? Symptoms of this condition include:  Pain.  Anal itching or irritation.  Rectal bleeding.  Leakage of stool (feces).  Anal swelling.  One or more lumps around the anus.  How is this diagnosed? This condition can often be diagnosed through a visual exam. Other exams or tests may also be done, such as:  Examination of the rectal area with a gloved hand (digital rectal exam).  Examination of the anal canal using a small tube (anoscope).  A blood test, if you have lost a significant amount of blood.  A test to look inside the colon (sigmoidoscopy or colonoscopy).  How is this treated? This condition can usually be treated at home. However, various procedures may be done if dietary changes, lifestyle changes, and other home treatments do not help your symptoms. These procedures can help make the hemorrhoids smaller or remove them completely. Some of these procedures involve  surgery, and others do not. Common procedures include:  Rubber band ligation. Rubber bands are placed at the base of the hemorrhoids to cut off the blood supply to them.  Sclerotherapy. Medicine is injected into the hemorrhoids to shrink them.  Infrared coagulation. A type of light energy is used to get rid of the hemorrhoids.  Hemorrhoidectomy surgery. The hemorrhoids are surgically removed, and the veins that supply them are tied off.  Stapled hemorrhoidopexy surgery. A circular stapling device is used to remove the hemorrhoids and use staples to cut off the blood supply to them.  Follow these instructions at home: Eating and drinking  Eat foods that have a lot of fiber in them, such as whole grains, beans, nuts, fruits, and vegetables. Ask your health care provider about taking products that have added fiber (fiber supplements).  Drink enough fluid to keep your urine clear or pale yellow. Managing pain and swelling  Take warm sitz baths for 20 minutes, 3-4 times a day to ease pain and discomfort.  If directed, apply ice to the affected area. Using ice packs between sitz baths may be helpful. ? Put ice in a plastic bag. ? Place a towel between your skin and the bag. ? Leave the ice on for 20 minutes, 2-3 times a day. General instructions  Take over-the-counter and prescription medicines only as told by your health care provider.  Use medicated creams or suppositories as told.  Exercise regularly.  Go to the bathroom when you have the urge to have a bowel movement. Do not wait.    Avoid straining to have bowel movements.  Keep the anal area dry and clean. Use wet toilet paper or moist towelettes after a bowel movement.  Do not sit on the toilet for long periods of time. This increases blood pooling and pain. Contact a health care provider if:  You have increasing pain and swelling that are not controlled by treatment or medicine.  You have uncontrolled bleeding.  You  have difficulty having a bowel movement, or you are unable to have a bowel movement.  You have pain or inflammation outside the area of the hemorrhoids. This information is not intended to replace advice given to you by your health care provider. Make sure you discuss any questions you have with your health care provider. Document Released: 01/21/2000 Document Revised: 06/23/2015 Document Reviewed: 10/07/2014 Elsevier Interactive Patient Education  2018 [employer]  

## 2017-12-24 ENCOUNTER — Ambulatory Visit: Payer: BLUE CROSS/BLUE SHIELD | Admitting: Obstetrics and Gynecology

## 2017-12-24 NOTE — Patient Instructions (Signed)
Meghan Carter  12/24/2017     @PREFPERIOPPHARMACY @   Your procedure is scheduled on  01/11/2018 .  Report to [facility] at  1115  A.M.  Call this number if you have problems the morning of surgery:  [phone]   Remember:  Do not eat or drink after midnight.                        Take these medicines the morning of surgery with A SIP OF WATER  Doxepin, zoloft.    Do not wear jewelry, make-up or nail polish.  Do not wear lotions, powders, or perfumes, or deodorant.  Do not shave 48 hours prior to surgery.  Men may shave face and neck.  Do not bring valuables to the hospital.  [facility] is not responsible for any belongings or valuables.  Contacts, dentures or bridgework may not be worn into surgery.  Leave your suitcase in the car.  After surgery it may be brought to your room.  For patients admitted to the hospital, discharge time will be determined by your treatment team.  Patients discharged the day of surgery will not be allowed to drive home.   Name and phone number of your driver:   family Special instructions:  none  Please read over the following fact sheets that you were given. Anesthesia Post-op Instructions and Care and Recovery After Surgery       Hemorrhoids Hemorrhoids are swollen veins in and around the rectum or anus. Hemorrhoids can cause pain, itching, or bleeding. Most of the time, they do not cause serious problems. They usually get better with diet changes, lifestyle changes, and other home treatments. Follow these instructions at home: Eating and drinking  Eat foods that have fiber, such as whole grains, beans, nuts, fruits, and vegetables. Ask your doctor about taking products that have added fiber (fibersupplements).  Drink enough fluid to keep your pee (urine) clear or pale yellow. For Pain and Swelling  Take a warm-water bath (sitz bath) for 20 minutes to ease pain. Do this 3-4 times a day.  If directed, put  ice on the painful area. It may be helpful to use ice between your warm baths. ? Put ice in a plastic bag. ? Place a towel between your skin and the bag. ? Leave the ice on for 20 minutes, 2-3 times a day. General instructions  Take over-the-counter and prescription medicines only as told by your doctor. ? Medicated creams and medicines that are inserted into the anus (suppositories) may be used or applied as told.  Exercise often.  Go to the bathroom when you have the urge to poop (to have a bowel movement). Do not wait.  Avoid pushing too hard (straining) when you poop.  Keep the butt area dry and clean. Use wet toilet paper or moist paper towels.  Do not sit on the toilet for a long time. Contact a doctor if:  You have any of these: ? Pain and swelling that do not get better with treatment or medicine. ? Bleeding that will not stop. ? Trouble pooping or you cannot poop. ? Pain or swelling outside the area of the hemorrhoids. This information is not intended to replace advice given to you by your health care provider. Make sure you discuss any questions you have with your health care provider. Document Released: 11/02/2007 Document Revised: 07/01/2015 Document Reviewed: 10/07/2014 Elsevier Interactive Patient  Education  2018 [place]. Surgical Procedures for Hemorrhoids Surgical procedures can be used to treat hemorrhoids. Hemorrhoids are swollen veins that are inside the rectum (internal hemorrhoids) or around the anus (external hemorrhoids). They are caused by increased pressure in the anal area. This pressure may result from straining to have a bowel movement (constipation), diarrhea, pregnancy, obesity, anal sex, or sitting for long periods of time. Hemorrhoids can cause symptoms such as pain and bleeding. Surgery may be needed if diet changes, lifestyle changes, and other treatments do not help your symptoms. Various surgical methods may be used. Three common methods  are:  Closed hemorrhoidectomy. The hemorrhoids are surgically removed, and the surgical cuts (incisions) are closed with stitches (sutures).  Open hemorrhoidectomy. The hemorrhoids are surgically removed, but the incisions are allowed to heal without sutures.  Stapled hemorrhoidopexy. The hemorrhoids are removed using a device that takes out a ring of excess tissue.  Tell a health care provider about:  Any allergies you have.  All medicines you are taking, including vitamins, herbs, eye drops, creams, and over-the-counter medicines.  Any problems you or family members have had with anesthetic medicines.  Any blood disorders you have.  Any surgeries you have had.  Any medical conditions you have.  Whether you are pregnant or may be pregnant. What are the risks? Generally, this is a safe procedure. However, problems may occur, including:  Infection.  Bleeding.  Allergic reactions to medicines.  Damage to other structures or organs.  Pain.  Constipation.  Difficulty passing urine.  Narrowing of the anal canal (stenosis).  Difficulty controlling bowel movements (incontinence).  What happens before the procedure?  Ask your health care provider about: ? Changing or stopping your regular medicines. This is especially important if you are taking diabetes medicines or blood thinners. ? Taking medicines such as aspirin and ibuprofen. These medicines can thin your blood. Do not take these medicines before your procedure if your health care provider instructs you not to.  You may need to have a procedure to examine the inside of your colon with a scope (colonoscopy). Your health care provider may do this to make sure that there are no other causes for your bleeding or pain.  Follow instructions from your health care provider about eating or drinking restrictions.  You may be instructed to take a laxative and an enema to clean out your colon before surgery (bowel prep).  Carefully follow instructions from your health care provider about bowel prep.  Ask your health care provider how your surgical site will be marked or identified.  You may be given antibiotic medicine to help prevent infection.  Plan to have someone take you home after the procedure. What happens during the procedure?  To reduce your risk of infection: ? Your health care team will wash or sanitize their hands. ? Your skin will be washed with soap.  An IV tube will be inserted into one of your veins.  You will be given one or more of the following: ? A medicine to help you relax (sedative). ? A medicine to numb the area (local anesthetic). ? A medicine to make you fall asleep (general anesthetic). ? A medicine that is injected into an area of your body to numb everything below the injection site (regional anesthetic).  A lubricating jelly may be placed into your rectum.  Your surgeon will insert a short scope (anoscope) into your rectum to examine the hemorrhoids.  One of the following hemorrhoid procedures will be  performed. Closed Hemorrhoidectomy  Your surgeon will use surgical instruments to open the tissue around the hemorrhoids.  The veins that supply the hemorrhoids will be tied off with a suture.  The hemorrhoids will be removed.  The tissue that surrounds the hemorrhoids will be closed with sutures that your body can absorb (absorbable sutures). Open Hemorrhoidectomy  The hemorrhoids will be removed with surgical instruments.  The incisions will be left open to heal without sutures. Stapled Hemorrhoidopexy  Your surgeon will use a circular stapling device to remove the hemorrhoids.  The device will be inserted into your anus. It will remove a circular ring of tissue that includes hemorrhoid tissue and some tissue above the hemorrhoids.  The staples in the device will close the edges of removed tissue. This will cut off the blood supply to the hemorrhoids and will  pull any remaining hemorrhoids back into place. Each of these procedures may vary among health care providers and hospitals. What happens after the procedure?  Your blood pressure, heart rate, breathing rate, and blood oxygen level will be monitored often until the medicines you were given have worn off.  You will be given pain medicine as needed. This information is not intended to replace advice given to you by your health care provider. Make sure you discuss any questions you have with your health care provider. Document Released: 11/20/2008 Document Revised: 07/01/2015 Document Reviewed: 04/20/2014 Elsevier Interactive Patient Education  2018 [place] Anesthesia, Adult General anesthesia is the use of medicines to make a person "go to sleep" (be unconscious) for a medical procedure. General anesthesia is often recommended when a procedure:  Is long.  Requires you to be still or in an unusual position.  Is major and can cause you to lose blood.  Is impossible to do without general anesthesia.  The medicines used for general anesthesia are called general anesthetics. In addition to making you sleep, the medicines:  Prevent pain.  Control your blood pressure.  Relax your muscles.  Tell a health care provider about:  Any allergies you have.  All medicines you are taking, including vitamins, herbs, eye drops, creams, and over-the-counter medicines.  Any problems you or family members have had with anesthetic medicines.  Types of anesthetics you have had in the past.  Any bleeding disorders you have.  Any surgeries you have had.  Any medical conditions you have.  Any history of heart or lung conditions, such as heart failure, sleep apnea, or chronic obstructive pulmonary disease (COPD).  Whether you are pregnant or may be pregnant.  Whether you use tobacco, alcohol, marijuana, or street drugs.  Any history of Armed forces logistics/support/administrative officer.  Any history of  depression or anxiety. What are the risks? Generally, this is a safe procedure. However, problems may occur, including:  Allergic reaction to anesthetics.  Lung and heart problems.  Inhaling food or liquids from your stomach into your lungs (aspiration).  Injury to nerves.  Waking up during your procedure and being unable to move (rare).  Extreme agitation or a state of mental confusion (delirium) when you wake up from the anesthetic.  Air in the bloodstream, which can lead to stroke.  These problems are more likely to develop if you are having a major surgery or if you have an advanced medical condition. You can prevent some of these complications by answering all of your health care provider's questions thoroughly and by following all pre-procedure instructions. General anesthesia can cause side effects, including:  Nausea  or vomiting  A sore throat from the breathing tube.  Feeling cold or shivery.  Feeling tired, washed out, or achy.  Sleepiness or drowsiness.  Confusion or agitation.  What happens before the procedure? Staying hydrated Follow instructions from your health care provider about hydration, which may include:  Up to 2 hours before the procedure - you may continue to drink clear liquids, such as water, clear fruit juice, black coffee, and plain tea.  Eating and drinking restrictions Follow instructions from your health care provider about eating and drinking, which may include:  8 hours before the procedure - stop eating heavy meals or foods such as meat, fried foods, or fatty foods.  6 hours before the procedure - stop eating light meals or foods, such as toast or cereal.  6 hours before the procedure - stop drinking milk or drinks that contain milk.  2 hours before the procedure - stop drinking clear liquids.  Medicines  Ask your health care provider about: ? Changing or stopping your regular medicines. This is especially important if you are  taking diabetes medicines or blood thinners. ? Taking medicines such as aspirin and ibuprofen. These medicines can thin your blood. Do not take these medicines before your procedure if your health care provider instructs you not to. ? Taking new dietary supplements or medicines. Do not take these during the week before your procedure unless your health care provider approves them.  If you are told to take a medicine or to continue taking a medicine on the day of the procedure, take the medicine with sips of water. General instructions   Ask if you will be going home the same day, the following day, or after a longer hospital stay. ? Plan to have someone take you home. ? Plan to have someone stay with you for the first 24 hours after you leave the hospital or clinic.  For 3-6 weeks before the procedure, try not to use any tobacco products, such as cigarettes, chewing tobacco, and e-cigarettes.  You may brush your teeth on the morning of the procedure, but make sure to spit out the toothpaste. What happens during the procedure?  You will be given anesthetics through a mask and through an IV tube in one of your veins.  You may receive medicine to help you relax (sedative).  As soon as you are asleep, a breathing tube may be used to help you breathe.  An anesthesia specialist will stay with you throughout the procedure. He or she will help keep you comfortable and safe by continuing to give you medicines and adjusting the amount of medicine that you get. He or she will also watch your blood pressure, pulse, and oxygen levels to make sure that the anesthetics do not cause any problems.  If a breathing tube was used to help you breathe, it will be removed before you wake up. The procedure may vary among health care providers and hospitals. What happens after the procedure?  You will wake up, often slowly, after the procedure is complete, usually in a recovery area.  Your blood pressure,  heart rate, breathing rate, and blood oxygen level will be monitored until the medicines you were given have worn off.  You may be given medicine to help you calm down if you feel anxious or agitated.  If you will be going home the same day, your health care provider may check to make sure you can stand, drink, and urinate.  Your health care providers  will treat your pain and side effects before you go home.  Do not drive for 24 hours if you received a sedative.  You may: ? Feel nauseous and vomit. ? Have a sore throat. ? Have mental slowness. ? Feel cold or shivery. ? Feel sleepy. ? Feel tired. ? Feel sore or achy, even in parts of your body where you did not have surgery. This information is not intended to replace advice given to you by your health care provider. Make sure you discuss any questions you have with your health care provider. Document Released: 05/02/2007 Document Revised: 07/06/2015 Document Reviewed: 01/07/2015 Elsevier Interactive Patient Education  2018 [place] Anesthesia, Adult, Care After These instructions provide you with information about caring for yourself after your procedure. Your health care provider may also give you more specific instructions. Your treatment has been planned according to current medical practices, but problems sometimes occur. Call your health care provider if you have any problems or questions after your procedure. What can I expect after the procedure? After the procedure, it is common to have:  Vomiting.  A sore throat.  Mental slowness.  It is common to feel:  Nauseous.  Cold or shivery.  Sleepy.  Tired.  Sore or achy, even in parts of your body where you did not have surgery.  Follow these instructions at home: For at least 24 hours after the procedure:  Do not: ? Participate in activities where you could fall or become injured. ? Drive. ? Use heavy machinery. ? Drink alcohol. ? Take sleeping  pills or medicines that cause drowsiness. ? Make important decisions or sign legal documents. ? Take care of children on your own.  Rest. Eating and drinking  If you vomit, drink water, juice, or soup when you can drink without vomiting.  Drink enough fluid to keep your urine clear or pale yellow.  Make sure you have little or no nausea before eating solid foods.  Follow the diet recommended by your health care provider. General instructions  Have a responsible adult stay with you until you are awake and alert.  Return to your normal activities as told by your health care provider. Ask your health care provider what activities are safe for you.  Take over-the-counter and prescription medicines only as told by your health care provider.  If you smoke, do not smoke without supervision.  Keep all follow-up visits as told by your health care provider. This is important. Contact a health care provider if:  You continue to have nausea or vomiting at home, and medicines are not helpful.  You cannot drink fluids or start eating again.  You cannot urinate after 8-12 hours.  You develop a skin rash.  You have fever.  You have increasing redness at the site of your procedure. Get help right away if:  You have difficulty breathing.  You have chest pain.  You have unexpected bleeding.  You feel that you are having a life-threatening or urgent problem. This information is not intended to replace advice given to you by your health care provider. Make sure you discuss any questions you have with your health care provider. Document Released: 05/01/2000 Document Revised: 06/28/2015 Document Reviewed: 01/07/2015 Elsevier Interactive Patient Education  [employer].

## 2017-12-24 NOTE — Progress Notes (Deleted)
GYNECOLOGY  VISIT   HPI: 37 y.o.   Legally Separated  African American  female   [place] with Patient's last menstrual period was 12/02/2017.   here for vaginal discharge.    GYNECOLOGIC HISTORY: Patient's last menstrual period was 12/02/2017. Contraception:  *** Menopausal hormone therapy:  none Last mammogram: n/a Last pap smear:  03/02/17 Pap and HR HPV negative        OB History    Gravida  1   Para      Term      Preterm      AB  1   Living        SAB      TAB      Ectopic  1   Multiple      Live Births                 Patient Active Problem List   Diagnosis Date Noted  . Acute blood loss anemia 03/26/2015  . Severe anemia   . Lower abdominal pain   . Weakness   . Other fatigue   . Rectal pain 03/19/2014  . 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 Endometrial polyp 02/11/2014  . History of abnormal cervical Pap smear 02/11/2014  . Pelvic pain in female 01/27/2014  . Menorrhagia with regular cycle 01/27/2014  . Hemorrhoids, internal 01/27/2014  . Fecal occult blood test positive 01/27/2014    Past Medical History:  Diagnosis Date  . Anemia   . Anxiety   . Blood transfusion without reported diagnosis 2017   2 units, no reaction  . Depression   . Dysmenorrhea   . Endometrial polyp 02/11/2014  . Fecal occult blood test positive 01/27/2014  . History of abnormal cervical Pap smear 02/11/2014  . HSV (herpes simplex virus) infection   . Hydrosalpinx    Bilateral  . Internal hemorrhoids   . Menorrhagia with regular cycle 01/27/2014  . Pelvic pain in female 01/27/2014  . Rectal bleeding 01/27/2014  . Vaginal Pap smear, abnormal     Past Surgical History:  Procedure Laterality Date  . CHROMOPERTUBATION  08/14/2017   Procedure: CHROMOPERTUBATION;  Surgeon: Governor Specking, MD;  Location: [facility];  Service: Gynecology;;  . COLONOSCOPY N/A 03/23/2014   SLF: 1. The examined terminal ileum appeared to be normal. 2. The left colon is redundant 3. Moderate sized  internal hemorrhoids  . ECTOPIC PREGNANCY SURGERY Left 2005  . FLEXIBLE SIGMOIDOSCOPY N/A 04/01/2015   SLF: rectal bleeding due to large internal hemorrhoids 2. anemia due to rectal bleeding/memses/ poor iron intake.  Marland Kitchen FLEXIBLE SIGMOIDOSCOPY N/A 03/27/2015   Procedure: FLEXIBLE SIGMOIDOSCOPY;  Surgeon: Danie Binder, MD;  Location: [facility];  Service: Endoscopy;  Laterality: N/A;  . HEMORRHOID BANDING N/A 03/23/2014   Procedure: HEMORRHOID BANDING;  Surgeon: Danie Binder, MD;  Location: [facility];  Service: Endoscopy;  Laterality: N/A;  . HEMORRHOID BANDING  03/27/2015   Procedure: HEMORRHOID BANDING;  Surgeon: Danie Binder, MD;  Location: [facility];  Service: Endoscopy;;  . HYSTEROSCOPY W/D&C N/A 06/16/2014   Procedure: DILATATION AND CURETTAGE Pollyann Glen;  Surgeon: Jonnie Kind, MD;  Location: [facility];  Service: Gynecology;  Laterality: N/A;  . LAPAROSCOPIC UNILATERAL SALPINGECTOMY Right 06/16/2014   Procedure: LAPAROSCOPIC UNILATERAL SALPINGECTOMY;  Surgeon: Jonnie Kind, MD;  Location: [facility];  Service: Gynecology;  Laterality: Right;  . LAPAROSCOPY N/A 08/14/2017   Procedure: OPERATIVE LAPAROSCOPY ,LEFT SALPINGECTOMY, LYSIS OF ADHESIONS;  Surgeon: Governor Specking, MD;  Location:  [facility];  Service: Gynecology;  Laterality: N/A;  . POLYPECTOMY N/A 06/16/2014   Procedure: POLYPECTOMY (endometrial);  Surgeon: Jonnie Kind, MD;  Location: [facility];  Service: Gynecology;  Laterality: N/A;    Current Outpatient Medications  Medication Sig Dispense Refill  . CLOBETASOL PROPIONATE E 0.05 % emollient cream   0  . docusate sodium (COLACE) 100 MG capsule Take 100 mg by mouth daily as needed for mild constipation.    Marland Kitchen doxepin (SINEQUAN) 10 MG capsule   0  . letrozole (FEMARA) 2.5 MG tablet Take 1 tablet by mouth daily. Start at the beginning of next cycle  2  . Lidocaine-Hydrocortisone Ace 3-2.5 % KIT APPLY TO RECTUM QID FOR 2 WEEKS 1 each 0  . norethindrone (AYGESTIN) 5 MG  tablet Take 2.5 mg by mouth daily.   2  . sertraline (ZOLOFT) 100 MG tablet Take 50 mg by mouth as needed.   2  . UNABLE TO FIND hemclear     No current facility-administered medications for this visit.      ALLERGIES: Other  Family History  Problem Relation Age of Onset  . Diabetes Father   . Diabetes Paternal Grandmother   . Diabetes Paternal Grandfather     Social History   Socioeconomic History  . Marital status: Legally Separated    Spouse name: Not on file  . Number of children: Not on file  . Years of education: Not on file  . Highest education level: Not on file  Occupational History  . Not on file  Social Needs  . Financial resource strain: Not on file  . Food insecurity:    Worry: Not on file    Inability: Not on file  . Transportation needs:    Medical: Not on file    Non-medical: Not on file  Tobacco Use  . Smoking status: Light Tobacco Smoker    Packs/day: 0.25    Years: 27.00    Pack years: 6.75    Types: Cigarettes  . Smokeless tobacco: Never Used  Substance and Sexual Activity  . Alcohol use: Yes    Comment: "very little"  . Drug use: No  . Sexual activity: Never    Partners: Male    Birth control/protection: None  Lifestyle  . Physical activity:    Days per week: Not on file    Minutes per session: Not on file  . Stress: Not on file  Relationships  . Social connections:    Talks on phone: Not on file    Gets together: Not on file    Attends religious service: Not on file    Active member of club or organization: Not on file    Attends meetings of clubs or organizations: Not on file    Relationship status: Not on file  . Intimate partner violence:    Fear of current or ex partner: Not on file    Emotionally abused: Not on file    Physically abused: Not on file    Forced sexual activity: Not on file  Other Topics Concern  . Not on file  Social History Narrative  . Not on file    Review of Systems  PHYSICAL EXAMINATION:    LMP  12/02/2017     General appearance: alert, cooperative and appears stated age Head: Normocephalic, without obvious abnormality, atraumatic Neck: no adenopathy, supple, symmetrical, trachea midline and thyroid normal to inspection and palpation Lungs: clear to auscultation bilaterally Breasts: normal appearance, no masses or tenderness,  No nipple retraction or dimpling, No nipple discharge or bleeding, No axillary or supraclavicular adenopathy Heart: regular rate and rhythm Abdomen: soft, non-tender, no masses,  no organomegaly Extremities: extremities normal, atraumatic, no cyanosis or edema Skin: Skin color, texture, turgor normal. No rashes or lesions Lymph nodes: Cervical, supraclavicular, and axillary nodes normal. No abnormal inguinal nodes palpated Neurologic: Grossly normal  Pelvic: External genitalia:  no lesions              Urethra:  normal appearing urethra with no masses, tenderness or lesions              Bartholins and Skenes: normal                 Vagina: normal appearing vagina with normal color and discharge, no lesions              Cervix: no lesions                Bimanual Exam:  Uterus:  normal size, contour, position, consistency, mobility, non-tender              Adnexa: no mass, fullness, tenderness              Rectal exam: {yes no:314532}.  Confirms.              Anus:  normal sphincter tone, no lesions  Chaperone was present for exam.  ASSESSMENT     PLAN     An After Visit Summary was printed and given to the patient.  ______ minutes face to face time of which over 50% was spent in counseling.

## 2017-12-31 ENCOUNTER — Encounter (HOSPITAL_COMMUNITY)
Admission: RE | Admit: 2017-12-31 | Discharge: 2017-12-31 | Disposition: A | Discharge status: Home / Self Care | Payer: BLUE CROSS/BLUE SHIELD | Source: Ambulatory Visit | Attending: General Surgery | Admitting: General Surgery

## 2017-12-31 ENCOUNTER — Other Ambulatory Visit: Payer: Self-pay

## 2017-12-31 ENCOUNTER — Encounter (HOSPITAL_COMMUNITY): Payer: Self-pay

## 2017-12-31 DIAGNOSIS — Z01818 Encounter for other preprocedural examination: Secondary | ICD-10-CM | POA: Insufficient documentation

## 2017-12-31 HISTORY — DX: Atherosclerosis of other arteries: I70.8

## 2017-12-31 HISTORY — DX: Unspecified atherosclerosis: I70.90

## 2017-12-31 LAB — BASIC METABOLIC PANEL
ANION GAP: 8 (ref 5–15)
BUN: 12 mg/dL (ref 6–20)
CHLORIDE: 106 mmol/L (ref 98–111)
CO2: 22 mmol/L (ref 22–32)
Calcium: 8.7 mg/dL — ABNORMAL LOW (ref 8.9–10.3)
Creatinine, Ser: 0.74 mg/dL (ref 0.44–1.00)
GFR calc non Af Amer: >60 mL/min (ref >60)
GLUCOSE: 108 mg/dL — AB (ref 70–99)
Potassium: 3.8 mmol/L (ref 3.5–5.1)
Sodium: 136 mmol/L (ref 135–145)

## 2017-12-31 LAB — CBC WITH DIFFERENTIAL/PLATELET
ABS IMMATURE GRANULOCYTES: 0 10*3/uL (ref 0.00–0.07)
BASOS ABS: 0 10*3/uL (ref 0.0–0.1)
Basophils Relative: 1 %
Eosinophils Absolute: 0.3 10*3/uL (ref 0.0–0.5)
Eosinophils Relative: 8 %
HEMATOCRIT: 31.8 % — AB (ref 36.0–46.0)
HEMOGLOBIN: 9.8 g/dL — AB (ref 12.0–15.0)
IMMATURE GRANULOCYTES: 0 %
LYMPHS PCT: 39 %
Lymphs Abs: 1.4 10*3/uL (ref 0.7–4.0)
MCH: 25.7 pg — ABNORMAL LOW (ref 26.0–34.0)
MCHC: 30.8 g/dL (ref 30.0–36.0)
MCV: 83.5 fL (ref 80.0–100.0)
MONOS PCT: 11 %
Monocytes Absolute: 0.4 10*3/uL (ref 0.1–1.0)
NEUTROS ABS: 1.5 10*3/uL — AB (ref 1.7–7.7)
NEUTROS PCT: 41 %
NRBC: 0 % (ref 0.0–0.2)
Platelets: 294 10*3/uL (ref 150–400)
RBC: 3.81 MIL/uL — ABNORMAL LOW (ref 3.87–5.11)
RDW: 16 % — ABNORMAL HIGH (ref 11.5–15.5)
WBC: 3.6 10*3/uL — ABNORMAL LOW (ref 4.0–10.5)

## 2017-12-31 LAB — HCG, SERUM, QUALITATIVE: Preg, Serum: NEGATIVE

## 2018-01-02 ENCOUNTER — Ambulatory Visit (INDEPENDENT_AMBULATORY_CARE_PROVIDER_SITE_OTHER): Payer: BLUE CROSS/BLUE SHIELD | Admitting: Obstetrics and Gynecology

## 2018-01-02 ENCOUNTER — Other Ambulatory Visit: Payer: Self-pay

## 2018-01-02 ENCOUNTER — Encounter: Payer: Self-pay | Admitting: Obstetrics and Gynecology

## 2018-01-02 ENCOUNTER — Other Ambulatory Visit (HOSPITAL_COMMUNITY)
Admission: RE | Admit: 2018-01-02 | Discharge: 2018-01-02 | Disposition: A | Discharge status: Home / Self Care | Payer: BLUE CROSS/BLUE SHIELD | Source: Ambulatory Visit | Attending: Obstetrics and Gynecology | Admitting: Obstetrics and Gynecology

## 2018-01-02 VITALS — BP 110/64 | HR 84 | Ht 62.75 in | Wt 201.4 lb

## 2018-01-02 DIAGNOSIS — Z113 Encounter for screening for infections with a predominantly sexual mode of transmission: Secondary | ICD-10-CM | POA: Insufficient documentation

## 2018-01-02 DIAGNOSIS — N898 Other specified noninflammatory disorders of vagina: Secondary | ICD-10-CM | POA: Diagnosis not present

## 2018-01-02 DIAGNOSIS — F17200 Nicotine dependence, unspecified, uncomplicated: Secondary | ICD-10-CM

## 2018-01-02 DIAGNOSIS — F439 Reaction to severe stress, unspecified: Secondary | ICD-10-CM

## 2018-01-02 NOTE — Progress Notes (Signed)
GYNECOLOGY  VISIT   HPI: 37 y.o.   Legally Separated  African American  female   [place] with Patient's last menstrual period was 12/22/2017.   here for vaginal discharge with odor. Patient would also like to discuss recent CT abd/pelvis.  Wants STD testing.  Reunited with her husband temporarily but then divorced.  States her vaginal dryness was part of the reason for this.   States that sexual fluids and bleeding comes outside of her rectum.  She had a CT scan showing no rectovaginal fistula.  Prior CT scan showed atherosclerosis of her pelvic vessels.  Had laparoscopy with lysis of adhesions, excision of peritoneal lesions, left salpingectomy and chromopertubation with Dr. Kerin Perna for dysmenorrhea, chronic pelvic pain on 08/14/17. Pathology showed endometriosis.   Feels her incision when she bends over.   Having hemorrhoid surgery on 01/11/18 with Dr. Aviva Signs at [facility].  Has a lot of pain from it which even affects the ability to void.   Stressed and taking Zoloft sporadically.  Has difficulty concentrating with she takes it.  This is prescribed through her PCP.   GYNECOLOGIC HISTORY: Patient's last menstrual period was 12/22/2017. Contraception: Abstinence  Menopausal hormone therapy:  n/a Last mammogram:  n/a Last pap smear:  03/02/17 Pap and HR HPV negative        OB History    Gravida  1   Para      Term      Preterm      AB  1   Living        SAB      TAB      Ectopic  1   Multiple      Live Births                 Patient Active Problem List   Diagnosis Date Noted  . Acute blood loss anemia 03/26/2015  . Severe anemia   . Lower abdominal pain   . Weakness   . Other fatigue   . Rectal pain 03/19/2014  . 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 Endometrial polyp 02/11/2014  . History of abnormal cervical Pap smear 02/11/2014  . Pelvic pain in female 01/27/2014  . Menorrhagia with regular cycle 01/27/2014  . Hemorrhoids, internal 01/27/2014   . Fecal occult blood test positive 01/27/2014    Past Medical History:  Diagnosis Date  . Anemia   . Anxiety   . Atherosclerosis of arteries   . Blood transfusion without reported diagnosis 2017   2 units, no reaction  . Depression   . Dysmenorrhea   . Endometrial polyp 02/11/2014  . Fecal occult blood test positive 01/27/2014  . Fibroid   . History of abnormal cervical Pap smear 02/11/2014  . HSV (herpes simplex virus) infection   . Hydrosalpinx    Bilateral  . Internal hemorrhoids   . Menorrhagia with regular cycle 01/27/2014  . Pelvic pain in female 01/27/2014  . Rectal bleeding 01/27/2014  . Vaginal Pap smear, abnormal     Past Surgical History:  Procedure Laterality Date  . CHROMOPERTUBATION  08/14/2017   Procedure: CHROMOPERTUBATION;  Surgeon: Governor Specking, MD;  Location: [facility];  Service: Gynecology;;  . COLONOSCOPY N/A 03/23/2014   SLF: 1. The examined terminal ileum appeared to be normal. 2. The left colon is redundant 3. Moderate sized internal hemorrhoids  . ECTOPIC PREGNANCY SURGERY Left 2005  . FLEXIBLE SIGMOIDOSCOPY N/A 04/01/2015   SLF: rectal bleeding due to large internal hemorrhoids 2. anemia due  to rectal bleeding/memses/ poor iron intake.  Marland Kitchen FLEXIBLE SIGMOIDOSCOPY N/A 03/27/2015   Procedure: FLEXIBLE SIGMOIDOSCOPY;  Surgeon: Danie Binder, MD;  [facility];  Service: Endoscopy;  Laterality: N/A;  . HEMORRHOID BANDING N/A 03/23/2014   Procedure: HEMORRHOID BANDING;  Surgeon: Danie Binder, MD;  [facility];  Service: Endoscopy;  Laterality: N/A;  . HEMORRHOID BANDING  03/27/2015   Procedure: HEMORRHOID BANDING;  Surgeon: Danie Binder, MD;  [facility];  Service: Endoscopy;;  . HYSTEROSCOPY W/D&C N/A 06/16/2014   Procedure: DILATATION AND CURETTAGE Pollyann Glen;  Surgeon: Jonnie Kind, MD;  Location: [facility];  Service: Gynecology;  Laterality: N/A;  . LAPAROSCOPIC UNILATERAL SALPINGECTOMY Right 06/16/2014   Procedure:  LAPAROSCOPIC UNILATERAL SALPINGECTOMY;  Surgeon: Jonnie Kind, MD;  Location: [facility];  Service: Gynecology;  Laterality: Right;  . LAPAROSCOPY N/A 08/14/2017   Procedure: OPERATIVE LAPAROSCOPY ,LEFT SALPINGECTOMY, LYSIS OF ADHESIONS;  Surgeon: Governor Specking, MD;  Location: [facility];  Service: Gynecology;  Laterality: N/A;  . POLYPECTOMY N/A 06/16/2014   Procedure: POLYPECTOMY (endometrial);  Surgeon: Jonnie Kind, MD;  Location: [facility];  Service: Gynecology;  Laterality: N/A;    Current Outpatient Medications  Medication Sig Dispense Refill  . Lidocaine-Hydrocortisone Ace 3-2.5 % KIT APPLY TO RECTUM QID FOR 2 WEEKS (Patient taking differently: Place 1 application rectally daily as needed (hemorrhoid). APPLY TO RECTUM QID FOR 2 WEEKS) 1 each 0  . psyllium (REGULOID) 0.52 g capsule Take 0.52 g by mouth 2 (two) times daily.    . sertraline (ZOLOFT) 100 MG tablet Take 50 mg by mouth daily as needed (stress).   2   No current facility-administered medications for this visit.      ALLERGIES: Doxycycline hyclate  Family History  Problem Relation Age of Onset  . Diabetes Father   . Diabetes Paternal Grandmother   . Diabetes Paternal Grandfather     Social History   Socioeconomic History  . Marital status: Legally Separated    Spouse name: Not on file  . Number of children: Not on file  . Years of education: Not on file  . Highest education level: Not on file  Occupational History  . Not on file  Social Needs  . Financial resource strain: Not on file  . Food insecurity:    Worry: Not on file    Inability: Not on file  . Transportation needs:    Medical: Not on file    Non-medical: Not on file  Tobacco Use  . Smoking status: Light Tobacco Smoker    Packs/day: 0.25    Years: 27.00    Pack years: 6.75    Types: Cigarettes  . Smokeless tobacco: Never Used  Substance and Sexual Activity  . Alcohol use: Yes    Comment: "very little"  . Drug use: No  . Sexual activity:  Never    Partners: Male    Birth control/protection: None  Lifestyle  . Physical activity:    Days per week: Not on file    Minutes per session: Not on file  . Stress: Not on file  Relationships  . Social connections:    Talks on phone: Not on file    Gets together: Not on file    Attends religious service: Not on file    Active member of club or organization: Not on file    Attends meetings of clubs or organizations: Not on file    Relationship status: Not on file  .  Intimate partner violence:    Fear of current or ex partner: Not on file    Emotionally abused: Not on file    Physically abused: Not on file    Forced sexual activity: Not on file  Other Topics Concern  . Not on file  Social History Narrative  . Not on file    Review of Systems  Gastrointestinal: Positive for constipation.       Bloating  Endocrine: Positive for cold intolerance, heat intolerance and polydipsia.  Genitourinary: Positive for frequency and urgency.       Loss of urine cough or sneeze  Skin:       Itching on abdomen, head, arms  Neurological: Positive for headaches.       Lack of coordination, difficulty with memory  Psychiatric/Behavioral: The patient is nervous/anxious.        Depression, excessive crying  All other systems reviewed and are negative.   PHYSICAL EXAMINATION:    BP 110/64 (BP Location: Right Arm, Patient Position: Sitting, Cuff Size: Large)   Pulse 84   Ht 5' 2.75" (1.594 m)   Wt 201 lb 6.4 oz (91.4 kg)   LMP 12/22/2017   BMI 35.96 kg/m     General appearance: alert, cooperative and appears stated age  Pelvic: External genitalia:  no lesions              Urethra:  normal appearing urethra with no masses, tenderness or lesions              Bartholins and Skenes: normal                 Vagina: normal appearing vagina with normal color and moderate white discharge, no lesions              Cervix:  Difficult to visualize due to patient discomfort with exam.                  Bimanual Exam:  Uterus:  normal size, contour, position, consistency, mobility, tender.              Adnexa: no mass, fullness, has generalized tenderness.              Exam limited.              Chaperone was present for exam.  ASSESSMENT  Vaginitis.  STD screening.  Chronic pelvic pain.  Stress.  Tobacco use disorder. Atherosclerosis.   PLAN  GC/CT/BV/trich/yeast testing.  STD screening in January at annual exam.  She will see her PCP about her atherosclerosis, smoking cessation and her Zoloft.  I suggested Wellbutrin XL. Written information given.  Annual exam in Jan. 2020.  An After Visit Summary was printed and given to the patient.  ___25___ minutes face to face time of which over 50% was spent in counseling.

## 2018-01-02 NOTE — Patient Instructions (Addendum)
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  - smoking cessation.   - your Zoloft medication   You may benefit from taking Wellbutrin instead of Zoloft.  It can treat depression, anxiety, stress and worry, and help you to stop smoking. Your primary care provider will determine if this is appropriate for you.   Bupropion extended-release tablets (Depression/Mood Disorders) What is this medicine? BUPROPION (byoo PROE pee on) is used to treat depression. This medicine may be used for other purposes; ask your health care provider or pharmacist if you have questions. COMMON BRAND NAME(S): Aplenzin, Budeprion XL, Forfivo XL, Wellbutrin XL What should I tell my health care provider before I take this medicine? They need to know if you have any of these conditions: -an eating disorder, such as anorexia or bulimia -bipolar disorder or psychosis -diabetes or high blood sugar, treated with medication -glaucoma -head injury or brain tumor -heart disease, previous heart attack, or irregular heart beat -high blood pressure -kidney or liver disease -seizures (convulsions) -suicidal thoughts or a previous suicide attempt -Tourette's syndrome -weight loss -an unusual or allergic reaction to bupropion, other medicines, foods, dyes, or preservatives -breast-feeding -pregnant or trying to become pregnant How should I use this medicine? Take this medicine by mouth with a glass of water. Follow the directions on the prescription label. You can take it with or without food. If it upsets your stomach, take it with food. Do not crush, chew, or cut these tablets. This medicine is taken once daily at the same time each day. Do not take your medicine more often than directed. Do not stop taking this medicine suddenly except upon the advice of your doctor. Stopping this medicine too quickly may cause serious side effects or your  condition may worsen. A special MedGuide will be given to you by the pharmacist with each prescription and refill. Be sure to read this information carefully each time. Talk to your pediatrician regarding the use of this medicine in children. Special care may be needed. Overdosage: If you think you have taken too much of this medicine contact a poison control center or emergency room at once. NOTE: This medicine is only for you. Do not share this medicine with others. What if I miss a dose? If you miss a dose, skip the missed dose and take your next tablet at the regular time. Do not take double or extra doses. What may interact with this medicine? Do not take this medicine with any of the following medications: -linezolid -MAOIs like Azilect, Carbex, Eldepryl, Marplan, Nardil, and Parnate -methylene blue (injected into a vein) -other medicines that contain bupropion like Zyban This medicine may also interact with the following medications: -alcohol -certain medicines for anxiety or sleep -certain medicines for blood pressure like metoprolol, propranolol -certain medicines for depression or psychotic disturbances -certain medicines for HIV or AIDS like efavirenz, lopinavir, nelfinavir, ritonavir -certain medicines for irregular heart beat like propafenone, flecainide -certain medicines for Parkinson's disease like amantadine, levodopa -certain medicines for seizures like carbamazepine, phenytoin, phenobarbital -cimetidine -clopidogrel -cyclophosphamide -digoxin -furazolidone -isoniazid -nicotine -orphenadrine -procarbazine -steroid medicines like prednisone or cortisone -stimulant medicines for attention disorders, weight loss, or to stay awake -tamoxifen -theophylline -thiotepa -ticlopidine -tramadol -warfarin This list may not describe all possible interactions. Give your health care provider a list of all the medicines, herbs, non-prescription drugs, or dietary supplements  you use. Also tell them if you smoke,  drink alcohol, or use illegal drugs. Some items may interact with your medicine. What should I watch for while using this medicine? Tell your doctor if your symptoms do not get better or if they get worse. Visit your doctor or health care professional for regular checks on your progress. Because it may take several weeks to see the full effects of this medicine, it is important to continue your treatment as prescribed by your doctor. Patients and their families should watch out for new or worsening thoughts of suicide or depression. Also watch out for sudden changes in feelings such as feeling anxious, agitated, panicky, irritable, hostile, aggressive, impulsive, severely restless, overly excited and hyperactive, or not being able to sleep. If this happens, especially at the beginning of treatment or after a change in dose, call your health care professional. Avoid alcoholic drinks while taking this medicine. Drinking large amounts of alcoholic beverages, using sleeping or anxiety medicines, or quickly stopping the use of these agents while taking this medicine may increase your risk for a seizure. Do not drive or use heavy machinery until you know how this medicine affects you. This medicine can impair your ability to perform these tasks. Do not take this medicine close to bedtime. It may prevent you from sleeping. Your mouth may get dry. Chewing sugarless gum or sucking hard candy, and drinking plenty of water may help. Contact your doctor if the problem does not go away or is severe. The tablet shell for some brands of this medicine does not dissolve. This is normal. The tablet shell may appear whole in the stool. This is not a cause for concern. What side effects may I notice from receiving this medicine? Side effects that you should report to your doctor or health care professional as soon as possible: -allergic reactions like skin rash, itching or hives,  swelling of the face, lips, or tongue -breathing problems -changes in vision -confusion -elevated mood, decreased need for sleep, racing thoughts, impulsive behavior -fast or irregular heartbeat -hallucinations, loss of contact with reality -increased blood pressure -redness, blistering, peeling or loosening of the skin, including inside the mouth -seizures -suicidal thoughts or other mood changes -unusually weak or tired -vomiting Side effects that usually do not require medical attention (report to your doctor or health care professional if they continue or are bothersome): -constipation -headache -loss of appetite -nausea -tremors -weight loss This list may not describe all possible side effects. Call your doctor for medical advice about side effects. You may report side effects to FDA at 1-800-FDA-1088. Where should I keep my medicine? Keep out of the reach of children. Store at room temperature between 15 and 30 degrees C (59 and 86 degrees F). Throw away any unused medicine after the expiration date. NOTE: This sheet is a summary. It may not cover all possible information. If you have questions about this medicine, talk to your doctor, pharmacist, or health care provider.  2018 Elsevier/Gold Standard (2015-07-16 13:55:13)   Coping with Quitting Smoking Quitting smoking is a physical and mental challenge. You will face cravings, withdrawal symptoms, and temptation. Before quitting, work with your health care provider to make a plan that can help you cope. Preparation can help you quit and keep you from giving in. How can I cope with cravings? Cravings usually last for 5-10 minutes. If you get through it, the craving will pass. Consider taking the following actions to help you cope with cravings:  Keep your mouth busy: ? Chew sugar-free gum. ?  Suck on hard candies or a straw. ? Brush your teeth.  Keep your hands and body busy: ? Immediately change to a different activity  when you feel a craving. ? Squeeze or play with a ball. ? Do an activity or a hobby, like making bead jewelry, practicing needlepoint, or working with wood. ? Mix up your normal routine. ? Take a short exercise break. Go for a quick walk or run up and down stairs. ? Spend time in public places where smoking is not allowed.  Focus on doing something kind or helpful for someone else.  Call a friend or family member to talk during a craving.  Join a support group.  Call a quit line, such as 1-800-QUIT-NOW.  Talk with your health care provider about medicines that might help you cope with cravings and make quitting easier for you.  How can I deal with withdrawal symptoms? Your body may experience negative effects as it tries to get used to not having nicotine in the system. These effects are called withdrawal symptoms. They may include:  Feeling hungrier than normal.  Trouble concentrating.  Irritability.  Trouble sleeping.  Feeling depressed.  Restlessness and agitation.  Craving a cigarette.  To manage withdrawal symptoms:  Avoid places, people, and activities that trigger your cravings.  Remember why you want to quit.  Get plenty of sleep.  Avoid coffee and other caffeinated drinks. These may worsen some of your symptoms.  How can I handle social situations? Social situations can be difficult when you are quitting smoking, especially in the first few weeks. To manage this, you can:  Avoid parties, bars, and other social situations where people might be smoking.  Avoid alcohol.  Leave right away if you have the urge to smoke.  Explain to your family and friends that you are quitting smoking. Ask for understanding and support.  Plan activities with friends or family where smoking is not an option.  What are some ways I can cope with stress? Wanting to smoke may cause stress, and stress can make you want to smoke. Find ways to manage your stress. Relaxation  techniques can help. For example:  Breathe slowly and deeply, in through your nose and out through your mouth.  Listen to soothing, relaxing music.  Talk with a family member or friend about your stress.  Light a candle.  Soak in a bath or take a shower.  Think about a peaceful place.  What are some ways I can prevent weight gain? Be aware that many people gain weight after they quit smoking. However, not everyone does. To keep from gaining weight, have a plan in place before you quit and stick to the plan after you quit. Your plan should include:  Having healthy snacks. When you have a craving, it may help to: ? Eat plain popcorn, crunchy carrots, celery, or other cut vegetables. ? Chew sugar-free gum.  Changing how you eat: ? Eat small portion sizes at meals. ? Eat 4-6 small meals throughout the day instead of 1-2 large meals a day. ? Be mindful when you eat. Do not watch television or do other things that might distract you as you eat.  Exercising regularly: ? Make time to exercise each day. If you do not have time for a long workout, do short bouts of exercise for 5-10 minutes several times a day. ? Do some form of strengthening exercise, like weight lifting, and some form of aerobic exercise, like running or swimming.  Drinking  plenty of water or other low-calorie or no-calorie drinks. Drink 6-8 glasses of water daily, or as much as instructed by your health care provider.  Summary  Quitting smoking is a physical and mental challenge. You will face cravings, withdrawal symptoms, and temptation to smoke again. Preparation can help you as you go through these challenges.  You can cope with cravings by keeping your mouth busy (such as by chewing gum), keeping your body and hands busy, and making calls to family, friends, or a helpline for people who want to quit smoking.  You can cope with withdrawal symptoms by avoiding places where people smoke, avoiding drinks with  caffeine, and getting plenty of rest.  Ask your health care provider about the different ways to prevent weight gain, avoid stress, and handle social situations. This information is not intended to replace advice given to you by your health care provider. Make sure you discuss any questions you have with your health care provider. Document Released: 01/21/2016 Document Revised: 01/21/2016 Document Reviewed: 01/21/2016 Elsevier Interactive Patient Education  [employer].

## 2018-01-04 LAB — CERVICOVAGINAL ANCILLARY ONLY
Bacterial vaginitis: POSITIVE — AB
Candida vaginitis: NEGATIVE
Chlamydia: NEGATIVE
NEISSERIA GONORRHEA: NEGATIVE
Trichomonas: NEGATIVE

## 2018-01-08 ENCOUNTER — Telehealth: Payer: Self-pay | Admitting: *Deleted

## 2018-01-08 MED ORDER — METRONIDAZOLE 0.75 % VA GEL: 1.0000 | Freq: Every day | VAGINAL | 0 refills | Status: AC

## 2018-01-08 NOTE — Telephone Encounter (Signed)
Notes recorded by Burnice Logan, RN on 01/08/2018 at 12:10 PM EST Left message to call Sharee Pimple, RN at [place].

## 2018-01-08 NOTE — Telephone Encounter (Signed)
Spoke with patient, advised as seen below per Dr. Quincy Simmonds. Rx for Metrogel to [facility], per patient request. Patient is scheduled for hemorrhoidectomy on 01/11/18, asking if ok to start metrogel?   Advised patient to f/u with surgeon to advise if ok to start metrogel or wait until after surgery.  Patient verbalizes understanding.   Routing to provider for final review. Patient is agreeable to disposition. Will close encounter.

## 2018-01-08 NOTE — Telephone Encounter (Signed)
Patient returning [place] call. States detailed message can be left on voicemail [phone].

## 2018-01-08 NOTE — Telephone Encounter (Signed)
-----   Message from Nunzio Cobbs, MD sent at 01/08/2018  5:18 AM EST ----- Please report results to patient.  She has bacterial vaginosis. She may treat with Flagyl 500 mg po bid for 7 days or Metrogel pv at hs for 5 nights.  Please send Rx to pharmacy of choice. ETOH precautions.   She tested negative for yeast, trichomonas, chlamydia, and gonorrhea.

## 2018-01-11 ENCOUNTER — Ambulatory Visit (HOSPITAL_COMMUNITY): Payer: BLUE CROSS/BLUE SHIELD | Admitting: Anesthesiology

## 2018-01-11 ENCOUNTER — Encounter (HOSPITAL_COMMUNITY)
Admission: RE | Disposition: A | Discharge status: Home / Self Care | Payer: Self-pay | Source: Ambulatory Visit | Attending: General Surgery

## 2018-01-11 ENCOUNTER — Encounter (HOSPITAL_COMMUNITY): Payer: Self-pay | Admitting: Anesthesiology

## 2018-01-11 ENCOUNTER — Ambulatory Visit (HOSPITAL_COMMUNITY)
Admission: RE | Admit: 2018-01-11 | Discharge: 2018-01-11 | Disposition: A | Discharge status: Home / Self Care | Payer: BLUE CROSS/BLUE SHIELD | Source: Ambulatory Visit | Attending: General Surgery | Admitting: General Surgery

## 2018-01-11 DIAGNOSIS — Z8719 Personal history of other diseases of the digestive system: Secondary | ICD-10-CM | POA: Insufficient documentation

## 2018-01-11 DIAGNOSIS — K625 Hemorrhage of anus and rectum: Secondary | ICD-10-CM | POA: Insufficient documentation

## 2018-01-11 DIAGNOSIS — K648 Other hemorrhoids: Secondary | ICD-10-CM | POA: Diagnosis not present

## 2018-01-11 DIAGNOSIS — F329 Major depressive disorder, single episode, unspecified: Secondary | ICD-10-CM | POA: Insufficient documentation

## 2018-01-11 DIAGNOSIS — F419 Anxiety disorder, unspecified: Secondary | ICD-10-CM | POA: Diagnosis not present

## 2018-01-11 DIAGNOSIS — K644 Residual hemorrhoidal skin tags: Secondary | ICD-10-CM

## 2018-01-11 DIAGNOSIS — Z79899 Other long term (current) drug therapy: Secondary | ICD-10-CM | POA: Diagnosis not present

## 2018-01-11 DIAGNOSIS — F17210 Nicotine dependence, cigarettes, uncomplicated: Secondary | ICD-10-CM | POA: Insufficient documentation

## 2018-01-11 HISTORY — PX: HEMORRHOID SURGERY: SHX153

## 2018-01-11 SURGERY — HEMORRHOIDECTOMY
Anesthesia: General | Site: Rectum

## 2018-01-11 MED ORDER — KETOROLAC TROMETHAMINE 30 MG/ML IJ SOLN
30.0000 mg | Freq: Once | INTRAMUSCULAR | Status: AC
  Administered 2018-01-11: 30 mg via INTRAVENOUS

## 2018-01-11 MED ORDER — HYDROMORPHONE HCL 1 MG/ML IJ SOLN
0.2500 mg | INTRAMUSCULAR | Status: DC | PRN
  Administered 2018-01-11: 0.5 mg via INTRAVENOUS
  Filled 2018-01-11: qty 0.5

## 2018-01-11 MED ORDER — CEFOTETAN DISODIUM 2 G IJ SOLR
INTRAMUSCULAR | Status: AC
  Filled 2018-01-11: qty 2

## 2018-01-11 MED ORDER — SODIUM CHLORIDE 0.9 % IV SOLN
2.0000 g | INTRAVENOUS | Status: AC
  Administered 2018-01-11: 2 g via INTRAVENOUS

## 2018-01-11 MED ORDER — BUPIVACAINE LIPOSOME 1.3 % IJ SUSP
INTRAMUSCULAR | Status: DC | PRN
  Administered 2018-01-11: 10 mL

## 2018-01-11 MED ORDER — PROMETHAZINE HCL 25 MG/ML IJ SOLN: 6.2500 mg | INTRAMUSCULAR | Status: DC | PRN

## 2018-01-11 MED ORDER — MIDAZOLAM HCL 5 MG/5ML IJ SOLN
INTRAMUSCULAR | Status: DC | PRN
  Administered 2018-01-11: 2 mg via INTRAVENOUS

## 2018-01-11 MED ORDER — KETOROLAC TROMETHAMINE 30 MG/ML IJ SOLN
INTRAMUSCULAR | Status: AC
  Filled 2018-01-11: qty 1

## 2018-01-11 MED ORDER — MEPERIDINE HCL 50 MG/ML IJ SOLN: 6.2500 mg | INTRAMUSCULAR | Status: DC | PRN

## 2018-01-11 MED ORDER — HYDROCODONE-ACETAMINOPHEN 7.5-325 MG PO TABS: 1.0000 | ORAL_TABLET | Freq: Once | ORAL | Status: DC | PRN

## 2018-01-11 MED ORDER — 0.9 % SODIUM CHLORIDE (POUR BTL) OPTIME
TOPICAL | Status: DC | PRN
  Administered 2018-01-11: 1000 mL

## 2018-01-11 MED ORDER — FENTANYL CITRATE (PF) 250 MCG/5ML IJ SOLN
INTRAMUSCULAR | Status: AC
  Filled 2018-01-11: qty 5

## 2018-01-11 MED ORDER — LACTATED RINGERS IV SOLN
INTRAVENOUS | Status: DC
  Administered 2018-01-11: 10:00:00 via INTRAVENOUS

## 2018-01-11 MED ORDER — ONDANSETRON HCL 4 MG/2ML IJ SOLN
INTRAMUSCULAR | Status: AC
  Filled 2018-01-11: qty 2

## 2018-01-11 MED ORDER — MIDAZOLAM HCL 2 MG/2ML IJ SOLN
INTRAMUSCULAR | Status: AC
  Filled 2018-01-11: qty 2

## 2018-01-11 MED ORDER — PROPOFOL 10 MG/ML IV BOLUS
INTRAVENOUS | Status: DC | PRN
  Administered 2018-01-11: 150 mg via INTRAVENOUS
  Administered 2018-01-11: 25 mg via INTRAVENOUS

## 2018-01-11 MED ORDER — BUPIVACAINE LIPOSOME 1.3 % IJ SUSP
INTRAMUSCULAR | Status: AC
  Filled 2018-01-11: qty 20

## 2018-01-11 MED ORDER — LIDOCAINE VISCOUS HCL 2 % MT SOLN
OROMUCOSAL | Status: DC | PRN
  Administered 2018-01-11: 1

## 2018-01-11 MED ORDER — PHENYLEPHRINE HCL 10 MG/ML IJ SOLN
INTRAMUSCULAR | Status: DC | PRN
  Administered 2018-01-11: 40 ug via INTRAVENOUS

## 2018-01-11 MED ORDER — CHLORHEXIDINE GLUCONATE CLOTH 2 % EX PADS: 6.0000 | MEDICATED_PAD | Freq: Once | CUTANEOUS | Status: DC

## 2018-01-11 MED ORDER — ONDANSETRON HCL 4 MG/2ML IJ SOLN
INTRAMUSCULAR | Status: DC | PRN
  Administered 2018-01-11: 4 mg via INTRAVENOUS

## 2018-01-11 MED ORDER — OXYCODONE-ACETAMINOPHEN 7.5-325 MG PO TABS: 1.0000 | ORAL_TABLET | Freq: Four times a day (QID) | ORAL | 0 refills | Status: DC | PRN

## 2018-01-11 MED ORDER — LACTATED RINGERS IV SOLN: INTRAVENOUS | Status: DC

## 2018-01-11 MED ORDER — LIDOCAINE VISCOUS HCL 2 % MT SOLN
OROMUCOSAL | Status: AC
  Filled 2018-01-11: qty 15

## 2018-01-11 MED ORDER — LIDOCAINE HCL (CARDIAC) PF 50 MG/5ML IV SOSY
PREFILLED_SYRINGE | INTRAVENOUS | Status: DC | PRN
  Administered 2018-01-11: 60 mg via INTRAVENOUS

## 2018-01-11 MED ORDER — FENTANYL CITRATE (PF) 100 MCG/2ML IJ SOLN
INTRAMUSCULAR | Status: DC | PRN
  Administered 2018-01-11: 25 ug via INTRAVENOUS
  Administered 2018-01-11: 50 ug via INTRAVENOUS
  Administered 2018-01-11: 25 ug via INTRAVENOUS
  Administered 2018-01-11: 50 ug via INTRAVENOUS

## 2018-01-11 MED ORDER — LIDOCAINE 2% (20 MG/ML) 5 ML SYRINGE
INTRAMUSCULAR | Status: AC
  Filled 2018-01-11: qty 5

## 2018-01-11 MED ORDER — PHENYLEPHRINE 40 MCG/ML (10ML) SYRINGE FOR IV PUSH (FOR BLOOD PRESSURE SUPPORT)
PREFILLED_SYRINGE | INTRAVENOUS | Status: AC
  Filled 2018-01-11: qty 10

## 2018-01-11 SURGICAL SUPPLY — 25 items
CLOTH BEACON ORANGE TIMEOUT ST (SAFETY) ×2 IMPLANT
COVER LIGHT HANDLE STERIS (MISCELLANEOUS) ×4 IMPLANT
DRAPE HALF SHEET 40X57 (DRAPES) ×2 IMPLANT
DRAPE PROXIMA HALF (DRAPES) ×2 IMPLANT
ELECT REM PT RETURN 9FT ADLT (ELECTROSURGICAL) ×2
ELECTRODE REM PT RTRN 9FT ADLT (ELECTROSURGICAL) ×1 IMPLANT
GAUZE SPONGE 4X4 12PLY STRL (GAUZE/BANDAGES/DRESSINGS) ×2 IMPLANT
GLOVE BIOGEL PI IND STRL 7.0 (GLOVE) ×2 IMPLANT
GLOVE BIOGEL PI INDICATOR 7.0 (GLOVE) ×2
GLOVE SURG SS PI 7.5 STRL IVOR (GLOVE) ×2 IMPLANT
GOWN STRL REUS W/TWL LRG LVL3 (GOWN DISPOSABLE) ×4 IMPLANT
HEMOSTAT SURGICEL 4X8 (HEMOSTASIS) ×2 IMPLANT
KIT TURNOVER CYSTO (KITS) ×2 IMPLANT
LIGASURE IMPACT 36 18CM CVD LR (INSTRUMENTS) ×2 IMPLANT
MANIFOLD NEPTUNE II (INSTRUMENTS) ×2 IMPLANT
NEEDLE HYPO 18GX1.5 BLUNT FILL (NEEDLE) ×2 IMPLANT
NEEDLE HYPO 22GX1.5 SAFETY (NEEDLE) ×2 IMPLANT
NS IRRIG 1000ML POUR BTL (IV SOLUTION) ×2 IMPLANT
PACK PERI GYN (CUSTOM PROCEDURE TRAY) ×2 IMPLANT
PAD ARMBOARD 7.5X6 YLW CONV (MISCELLANEOUS) ×2 IMPLANT
SET BASIN LINEN APH (SET/KITS/TRAYS/PACK) ×2 IMPLANT
SURGILUBE 3G PEEL PACK STRL (MISCELLANEOUS) ×2 IMPLANT
SUT SILK 0 FSL (SUTURE) ×2 IMPLANT
SUT VIC AB 2-0 CT2 27 (SUTURE) ×2 IMPLANT
SYR 20CC LL (SYRINGE) ×4 IMPLANT

## 2018-01-11 NOTE — Interval H&P Note (Signed)
History and Physical Interval Note:  01/11/2018 9:41 AM  Meghan Carter  has presented today for surgery, with the diagnosis of bleeding hemorrhoids  The various methods of treatment have been discussed with the patient and family. After consideration of risks, benefits and other options for treatment, the patient has consented to  Procedure(s): EXTENSIVE HEMORRHOIDECTOMY (N/A) as a surgical intervention .  The patient's history has been reviewed, patient examined, no change in status, stable for surgery.  I have reviewed the patient's chart and labs.  Questions were answered to the patient's satisfaction.     Aviva Signs

## 2018-01-11 NOTE — Anesthesia Preprocedure Evaluation (Signed)
Anesthesia Evaluation    Airway Mallampati: II       Dental  (+) Teeth Intact   Pulmonary Current Smoker,    breath sounds clear to auscultation       Cardiovascular  Rhythm:regular     Neuro/Psych    GI/Hepatic   Endo/Other    Renal/GU      Musculoskeletal   Abdominal   Peds  Hematology  (+) Blood dyscrasia, anemia ,   Anesthesia Other Findings Obesity Denies any sig CV, pulm, endo, neuro hx States NPO today  Reproductive/Obstetrics                             Anesthesia Physical Anesthesia Plan  ASA: II  Anesthesia Plan: General   Post-op Pain Management:    Induction:   PONV Risk Score and Plan:   Airway Management Planned:   Additional Equipment:   Intra-op Plan:   Post-operative Plan:   Informed Consent:   Plan Discussed with: Anesthesiologist  Anesthesia Plan Comments:         Anesthesia Quick Evaluation

## 2018-01-11 NOTE — Anesthesia Postprocedure Evaluation (Signed)
Anesthesia Post Note  Patient: Meghan Carter  Procedure(s) Performed: EXTENSIVE HEMORRHOIDECTOMY (N/A Rectum)  Patient location during evaluation: Short Stay Anesthesia Type: General Level of consciousness: awake and alert and oriented Pain management: pain level controlled Vital Signs Assessment: post-procedure vital signs reviewed and stable Respiratory status: spontaneous breathing Cardiovascular status: blood pressure returned to baseline Postop Assessment: no apparent nausea or vomiting Anesthetic complications: no     Last Vitals:  Vitals:   01/11/18 1151 01/11/18 1207  BP: (!) 159/78 109/79  Pulse: 64 82  Resp:  16  Temp: 36.7 C 36.8 C  SpO2: 94% 100%    Last Pain:  Vitals:   01/11/18 1207  TempSrc: Oral  PainSc: 6                  Marris Frontera

## 2018-01-11 NOTE — Op Note (Signed)
Patient:  Meghan Carter  DOB:  [birth year]  MRN:  [id number]   Preop Diagnosis: Prolapsing hemorrhoidal disease  Postop Diagnosis: Same, bleeding internal and external hemorrhoids  Procedure: Extensive hemorrhoidectomy, internal and external  Surgeon: Aviva Signs, MD  Anes: General  Indications: Patient is a 37 year old black female with a long-standing history of bleeding hemorrhoidal disease who presents for extensive hemorrhoidectomy.  The risks and benefits of the procedure including bleeding, infection, pain, and recurrence of the hemorrhoidal disease were fully explained to the patient, who gave informed consent.  Procedure note: The patient was placed in the lithotomy position after general anesthesia was administered.  The perineum was prepped and draped using usual sterile technique with Betadine.  Surgical site confirmation was performed.  On rectal examination, the patient had a large internal and external hemorrhoid which would prolapse at the 8 o'clock position.  There was a smaller internal hemorrhoid in the same region associated with this.  At the 3 o'clock position, and internal and external prolapsing hemorrhoid was also present.  2 smaller external hemorrhoidal skin tags were noted at the 12:00 and 6 o'clock position.  These were excised using the harmonic scalpel.  Other small columns of internal hemorrhoids were noted, but they were not prominent at this time.  The other hemorrhoids were removed in a column-like fashion using the LigaSure.  No abnormal bleeding was noted at the end of the procedure.  The sphincter was noted to be intact.  Exparel was instilled into the surrounding perineum.  Surgicel and Viscous Xylocaine rectal packing was placed.  All tape and needle counts were correct at the end of the procedure.  The patient was awakened in the operating room and transferred to the PACU in stable condition.  Complications: None  EBL: Minimal  Specimen:  Hemorrhoids

## 2018-01-11 NOTE — Anesthesia Procedure Notes (Signed)
Procedure Name: LMA Insertion Date/Time: 01/11/2018 10:33 AM Performed by: Ollen Bowl, CRNA Pre-anesthesia Checklist: Patient identified, Patient being monitored, Emergency Drugs available, Timeout performed and Suction available Patient Re-evaluated:Patient Re-evaluated prior to induction Oxygen Delivery Method: Circle System Utilized Preoxygenation: Pre-oxygenation with 100% oxygen Induction Type: IV induction Ventilation: Mask ventilation without difficulty LMA: LMA inserted LMA Size: 3.0 Number of attempts: 1 Placement Confirmation: positive ETCO2 and breath sounds checked- equal and bilateral

## 2018-01-11 NOTE — Discharge Instructions (Signed)
Surgical Procedures for Hemorrhoids, Care After °Refer to this sheet in the next few weeks. These instructions provide you with information about caring for yourself after your procedure. Your health care provider may also give you more specific instructions. Your treatment has been planned according to current medical practices, but problems sometimes occur. Call your health care provider if you have any problems or questions after your procedure. °What can I expect after the procedure? °After the procedure, it is common to have: °· Rectal pain. °· Pain when you are having a bowel movement. °· Slight rectal bleeding. ° °Follow these instructions at home: °Medicines °· Take over-the-counter and prescription medicines only as told by your health care provider. °· Do not drive or operate heavy machinery while taking prescription pain medicine. °· Use a stool softener or a bulk laxative as told by your health care provider. °Activity °· Rest at home. Return to your normal activities as told by your health care provider. °· Do not lift anything that is heavier than 10 lb (4.5 kg). °· Do not sit for long periods of time. Take a walk every day or as told by your health care provider. °· Do not strain to have a bowel movement. Do not spend a long time sitting on the toilet. °Eating and drinking °· Eat foods that contain fiber, such as whole grains, beans, nuts, fruits, and vegetables. °· Drink enough fluid to keep your urine clear or pale yellow. °General instructions °· Sit in a warm bath 2-3 times per day to relieve soreness or itching. °· Keep all follow-up visits as told by your health care provider. This is important. °Contact a health care provider if: °· Your pain medicine is not helping. °· You have a fever or chills. °· You become constipated. °· You have trouble passing urine. °Get help right away if: °· You have very bad rectal pain. °· You have heavy bleeding from your rectum. °This information is not intended  to replace advice given to you by your health care provider. Make sure you discuss any questions you have with your health care provider. °Document Released: 04/15/2003 Document Revised: 07/01/2015 Document Reviewed: 04/20/2014 °Elsevier Interactive Patient Education © 2018 Elsevier Inc. ° ° ° °PATIENT INSTRUCTIONS °POST-ANESTHESIA ° °IMMEDIATELY FOLLOWING SURGERY:  Do not drive or operate machinery for the first twenty four hours after surgery.  Do not make any important decisions for twenty four hours after surgery or while taking narcotic pain medications or sedatives.  If you develop intractable nausea and vomiting or a severe headache please notify your doctor immediately. ° °FOLLOW-UP:  Please make an appointment with your surgeon as instructed. You do not need to follow up with anesthesia unless specifically instructed to do so. ° °WOUND CARE INSTRUCTIONS (if applicable):  Keep a dry clean dressing on the anesthesia/puncture wound site if there is drainage.  Once the wound has quit draining you may leave it open to air.  Generally you should leave the bandage intact for twenty four hours unless there is drainage.  If the epidural site drains for more than 36-48 hours please call the anesthesia department. ° °QUESTIONS?:  Please feel free to call your physician or the hospital operator if you have any questions, and they will be happy to assist you.    ° ° ° ° °

## 2018-01-11 NOTE — Transfer of Care (Signed)
Immediate Anesthesia Transfer of Care Note  Patient: Meghan Carter  Procedure(s) Performed: EXTENSIVE HEMORRHOIDECTOMY (N/A Rectum)  Patient Location: PACU  Anesthesia Type:General  Level of Consciousness: awake  Airway & Oxygen Therapy: Patient Spontanous Breathing  Post-op Assessment: Report given to RN  Post vital signs: Reviewed and stable  Last Vitals:  Vitals Value Taken Time  BP 152/97 01/11/2018 11:21 AM  Temp 37 C 01/11/2018 11:17 AM  Pulse 82 01/11/2018 11:25 AM  Resp 17 01/11/2018 11:25 AM  SpO2 100 % 01/11/2018 11:25 AM  Vitals shown include unvalidated device data.  Last Pain:  Vitals:   01/11/18 0957  TempSrc: Oral  PainSc: 0-No pain      Patients Stated Pain Goal: 5 (12/78/71 8367)  Complications: No apparent anesthesia complications

## 2018-01-14 ENCOUNTER — Encounter (HOSPITAL_COMMUNITY): Payer: Self-pay | Admitting: General Surgery

## 2018-01-17 ENCOUNTER — Ambulatory Visit (INDEPENDENT_AMBULATORY_CARE_PROVIDER_SITE_OTHER): Payer: Self-pay | Admitting: General Surgery

## 2018-01-17 ENCOUNTER — Encounter: Payer: Self-pay | Admitting: General Surgery

## 2018-01-17 VITALS — BP 124/68 | HR 78 | Temp 97.3°F | Resp 20 | Wt 200.0 lb

## 2018-01-17 DIAGNOSIS — Z09 Encounter for follow-up examination after completed treatment for conditions other than malignant neoplasm: Secondary | ICD-10-CM

## 2018-01-17 MED ORDER — OXYCODONE-ACETAMINOPHEN 10-325 MG PO TABS: 1.0000 | ORAL_TABLET | Freq: Four times a day (QID) | ORAL | 0 refills | Status: DC | PRN

## 2018-01-17 NOTE — Progress Notes (Signed)
Subjective:     Meghan Carter  Status post extensive hemorrhoidectomy.  Patient having a significant amount of pain and stinging in the rectum.  She is moving her bowels.  Minimal blood per rectum has been noted. Objective:    BP 124/68 (BP Location: Left Arm, Patient Position: Sitting, Cuff Size: Normal)   Pulse 78   Temp (!) 97.3 F (36.3 C)   Resp 20   Wt 200 lb (90.7 kg)   LMP 12/22/2017   BMI 35.71 kg/m   General:  alert, cooperative and no distress  External rectal examination reveals no active bleeding Final pathology consistent with diagnosis     Assessment:    Doing well postoperatively.    Plan:   Will try percocet 10mg .  I told the patient that her pain was normal.  Rectiv samples given.  Follow up here in one week.

## 2018-01-24 ENCOUNTER — Encounter: Payer: Self-pay | Admitting: Emergency Medicine

## 2018-01-24 ENCOUNTER — Ambulatory Visit (INDEPENDENT_AMBULATORY_CARE_PROVIDER_SITE_OTHER): Payer: Self-pay | Admitting: General Surgery

## 2018-01-24 ENCOUNTER — Encounter: Payer: Self-pay | Admitting: General Surgery

## 2018-01-24 VITALS — BP 130/85 | HR 98 | Temp 96.2°F | Resp 18 | Wt 199.4 lb

## 2018-01-24 DIAGNOSIS — Z09 Encounter for follow-up examination after completed treatment for conditions other than malignant neoplasm: Secondary | ICD-10-CM

## 2018-01-24 DIAGNOSIS — B009 Herpesviral infection, unspecified: Secondary | ICD-10-CM | POA: Insufficient documentation

## 2018-01-24 MED ORDER — GABAPENTIN 400 MG PO CAPS: 400.0000 mg | ORAL_CAPSULE | Freq: Three times a day (TID) | ORAL | 0 refills | Status: DC

## 2018-01-24 NOTE — Progress Notes (Signed)
Subjective:     Meghan Carter  Here for postoperative visit.  Still having significant rectal pain.  The Rectiv was not helpful.  It caused a burning.  She notices a little bit of bright red blood on the stool when she has a bowel movement.  She is still very anxious about her rectal pain. Objective:    BP 130/85 (BP Location: Left Arm, Patient Position: Sitting, Cuff Size: Normal)   Pulse 98   Temp (!) 96.2 F (35.7 C) (Temporal)   Resp 18   Wt 199 lb 6.4 oz (90.4 kg)   BMI 35.60 kg/m   General:  alert, cooperative and no distress  External rectal examination reveals good healing.  No purulent drainage noted.  No active bleeding noted.     Assessment:    Postoperative incisional pain in rectum    Plan:   I told the patient that I cannot continue to prescribe narcotics.  Will try gabapentin 400 mg p.o. 3 times daily.  She did receive a compounded cream from [facility] and she may try that to help relieve the pain.  I told her that she had tried to break the cycle of her anxiety concerning her rectal pain.  I will see her again in 2 weeks for follow-up.

## 2018-02-05 ENCOUNTER — Telehealth: Payer: Self-pay | Admitting: Emergency Medicine

## 2018-02-05 NOTE — Telephone Encounter (Signed)
Patient called and stated she is still in a lot of pain. She said her pcp wrote her for 600mg  motrin but it is not helping. She wanted to know if she could be seen any sooner than 02/07/18. After speaking to doctor jenkins he stated for her to keep her appt on 02/07/18 and he will address her concerns then, I notified the patient and she stated she will be here on 02/07/18 @ 9:15am.

## 2018-02-07 ENCOUNTER — Ambulatory Visit: Payer: BLUE CROSS/BLUE SHIELD | Admitting: General Surgery

## 2018-02-07 ENCOUNTER — Ambulatory Visit: Payer: Self-pay | Admitting: General Surgery

## 2018-02-12 ENCOUNTER — Ambulatory Visit: Payer: Self-pay | Admitting: General Surgery

## 2018-02-13 ENCOUNTER — Telehealth: Payer: Self-pay | Admitting: Emergency Medicine
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
## 2018-02-14 ENCOUNTER — Telehealth: Payer: Self-pay | Admitting: General Surgery

## 2018-02-14 NOTE — Telephone Encounter (Signed)
Tried to return patient's call.  No answer.  No message left.

## 2018-02-28 ENCOUNTER — Ambulatory Visit: Payer: Self-pay | Admitting: General Surgery

## 2018-05-30 ENCOUNTER — Telehealth: Payer: Self-pay | Admitting: Obstetrics & Gynecology

## 2018-05-30 MED ORDER — METRONIDAZOLE 0.75 % VA GEL: VAGINAL | 0 refills | Status: DC
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
## 2018-06-04 ENCOUNTER — Telehealth: Payer: Self-pay | Admitting: Obstetrics & Gynecology

## 2018-06-04 MED ORDER — METRONIDAZOLE 500 MG PO TABS: 500.0000 mg | ORAL_TABLET | Freq: Two times a day (BID) | ORAL | 0 refills | Status: DC

## 2018-06-04 NOTE — Telephone Encounter (Signed)
Pt checking status on hearing back from Dr. Elonda Husky for an appt.

## 2018-06-04 NOTE — Telephone Encounter (Signed)
Per chart review Dr. Elonda Husky sent in metrogel. Patient prefers to use Flagyl 500mg  tablets instead. Advised I will send this to Dr. Elonda Husky and that she can check with pharmacy later today.

## 2018-06-04 NOTE — Telephone Encounter (Signed)
Changed per patient request.

## 2018-07-15 ENCOUNTER — Other Ambulatory Visit: Payer: Self-pay

## 2018-07-15 ENCOUNTER — Encounter: Payer: Self-pay | Admitting: Emergency Medicine

## 2018-07-15 ENCOUNTER — Ambulatory Visit
Admission: EM | Admit: 2018-07-15 | Discharge: 2018-07-15 | Disposition: A | Discharge status: Home / Self Care | Payer: PRIVATE HEALTH INSURANCE | Attending: Emergency Medicine | Admitting: Emergency Medicine

## 2018-07-15 DIAGNOSIS — F411 Generalized anxiety disorder: Secondary | ICD-10-CM

## 2018-07-15 MED ORDER — HYDROXYZINE HCL 25 MG PO TABS: 25.0000 mg | ORAL_TABLET | Freq: Four times a day (QID) | ORAL | 0 refills | Status: DC | PRN

## 2018-07-15 NOTE — ED Provider Notes (Signed)
[place]   [id number] 07/15/18 Arrival Time: 4132  CC: Anxiety and depression  SUBJECTIVE:  Meghan Carter is a 38 y.o. female who complains of anxiety and depression for the past 15 years.  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  Describes anxiety as intermittent and worse at night.  Currently taking zoloft. Reports improvement with zoloft, however, complains of associated difficulty concentrating while taking medication. Has tried exercising, and retail therapy with temporary relief in symptoms.   Denies HI or SI.  Complains of associated anhedonia, and difficulty sleeping.  Patient denies fever, chills, nausea, vomiting, chest pain, SOB, abdominal pain, changes in bowel or bladder habits.    ROS: As per HPI.  Past Medical History:  Diagnosis Date  . Anemia   . Anxiety   . Atherosclerosis of arteries   . Blood transfusion without reported diagnosis 2017   2 units, no reaction  . Depression   . Dysmenorrhea   . Endometrial polyp 02/11/2014  . Fecal occult blood test positive 01/27/2014  . Fibroid   . History of abnormal cervical Pap smear 02/11/2014  . HSV (herpes simplex virus) infection   . Hydrosalpinx    Bilateral  . Internal hemorrhoids   . Menorrhagia with regular cycle 01/27/2014  . Pelvic pain in female 01/27/2014  . Rectal bleeding 01/27/2014  . Vaginal Pap smear, abnormal    Past Surgical History:  Procedure Laterality Date  . CHROMOPERTUBATION  08/14/2017   Procedure: CHROMOPERTUBATION;  Surgeon: Governor Specking, MD;  Location: [facility];  Service: Gynecology;;  . COLONOSCOPY N/A 03/23/2014   SLF: 1. The examined terminal ileum appeared to be normal. 2. The left colon is redundant 3. Moderate sized internal hemorrhoids  . ECTOPIC PREGNANCY SURGERY Left 2005  . FLEXIBLE SIGMOIDOSCOPY N/A 04/01/2015   SLF: rectal bleeding due to large internal  hemorrhoids 2. anemia due to rectal bleeding/memses/ poor iron intake.  Marland Kitchen FLEXIBLE SIGMOIDOSCOPY N/A 03/27/2015   Procedure: FLEXIBLE SIGMOIDOSCOPY;  Surgeon: Danie Binder, MD;  [facility];  Service: Endoscopy;  Laterality: N/A;  . HEMORRHOID BANDING N/A 03/23/2014   Procedure: HEMORRHOID BANDING;  Surgeon: Danie Binder, MD;  [facility];  Service: Endoscopy;  Laterality: N/A;  . HEMORRHOID BANDING  03/27/2015   Procedure: HEMORRHOID BANDING;  Surgeon: Danie Binder, MD;  [facility];  Service: Endoscopy;;  . HEMORRHOID SURGERY N/A 01/11/2018   Procedure: EXTENSIVE HEMORRHOIDECTOMY;  Surgeon: Aviva Signs, MD;  Location: [facility];  Service: General;  Laterality: N/A;  . HYSTEROSCOPY W/D&C N/A 06/16/2014   Procedure: DILATATION AND CURETTAGE /HYSTEROSCOPY;  Surgeon: Jonnie Kind, MD;  Location: [facility];  Service: Gynecology;  Laterality: N/A;  . LAPAROSCOPIC UNILATERAL SALPINGECTOMY Right 06/16/2014   Procedure: LAPAROSCOPIC UNILATERAL SALPINGECTOMY;  Surgeon: Jonnie Kind, MD;  Location: [facility];  Service: Gynecology;  Laterality: Right;  . LAPAROSCOPY N/A 08/14/2017   Procedure: OPERATIVE LAPAROSCOPY ,LEFT SALPINGECTOMY, LYSIS OF ADHESIONS;  Surgeon: Governor Specking, MD;  Location: [facility];  Service: Gynecology;  Laterality: N/A;  . POLYPECTOMY N/A 06/16/2014   Procedure: POLYPECTOMY (endometrial);  Surgeon: Jonnie Kind, MD;  Location: [facility];  Service: Gynecology;  Laterality: N/A;   Allergies  Allergen Reactions  . Doxycycline Hyclate Rash   No current facility-administered medications on file prior to encounter.    Current Outpatient Medications on File Prior to Encounter  Medication Sig  Dispense Refill  . gabapentin (NEURONTIN) 400 MG capsule Take 1 capsule (400 mg total) by mouth 3 (three) times daily. 42 capsule 0  . Lidocaine-Hydrocortisone Ace 3-2.5 % KIT APPLY TO RECTUM QID FOR 2 WEEKS (Patient taking differently: Place 1 application  rectally daily as needed (hemorrhoid). APPLY TO RECTUM QID FOR 2 WEEKS) 1 each 0  . metroNIDAZOLE (FLAGYL) 500 MG tablet Take 1 tablet (500 mg total) by mouth 2 (two) times daily. (Patient not taking: Reported on 07/15/2018) 14 tablet 0  . metroNIDAZOLE (METROGEL VAGINAL) 0.75 % vaginal gel Nightly x 5 nights 70 g 0  . oxyCODONE-acetaminophen (PERCOCET) 10-325 MG tablet Take 1 tablet by mouth every 6 (six) hours as needed for pain. (Patient not taking: Reported on 07/15/2018) 25 tablet 0  . psyllium (REGULOID) 0.52 g capsule Take 0.52 g by mouth 2 (two) times daily.    . sertraline (ZOLOFT) 100 MG tablet Take 50 mg by mouth daily as needed (stress).   2   Social History   Socioeconomic History  . Marital status: Legally Separated    Spouse name: Not on file  . Number of children: Not on file  . Years of education: Not on file  . Highest education level: Not on file  Occupational History  . Not on file  Social Needs  . Financial resource strain: Not on file  . Food insecurity:    Worry: Not on file    Inability: Not on file  . Transportation needs:    Medical: Not on file    Non-medical: Not on file  Tobacco Use  . Smoking status: Light Tobacco Smoker    Packs/day: 0.25    Years: 27.00    Pack years: 6.75    Types: Cigarettes  . Smokeless tobacco: Never Used  Substance and Sexual Activity  . Alcohol use: Yes    Comment: "very little"  . Drug use: No  . Sexual activity: Never    Partners: Male    Birth control/protection: None  Lifestyle  . Physical activity:    Days per week: Not on file    Minutes per session: Not on file  . Stress: Not on file  Relationships  . Social connections:    Talks on phone: Not on file    Gets together: Not on file    Attends religious service: Not on file    Active member of club or organization: Not on file    Attends meetings of clubs or organizations: Not on file    Relationship status: Not on file  . Intimate partner violence:    Fear  of current or ex partner: Not on file    Emotionally abused: Not on file    Physically abused: Not on file    Forced sexual activity: Not on file  Other Topics Concern  . Not on file  Social History Narrative  . Not on file   Family History  Problem Relation Age of Onset  . Diabetes Father   . Diabetes Paternal Grandmother   . Diabetes Paternal Grandfather     OBJECTIVE:  Vitals:   07/15/18 1733  BP: 126/81  Pulse: (!) 125  Resp: 18  Temp: 98.2 F (36.8 C)  TempSrc: Oral  SpO2: 98%    General appearance: alert; well-dressed; no distress Eyes: conjunctiva not injected; EOMI grossly HENT: normocephalic; atraumatic; dentition intact Neck: supple with FROM Lungs: Normal respiratory effort Extremities: no edema; symmetrical with no gross deformities Skin: warm and dry Neurologic:  normal gait Psychological: alert and cooperative; anxious mood and affect   ASSESSMENT & PLAN:  1. Anxiety state     Meds ordered this encounter  Medications  . hydrOXYzine (ATARAX/VISTARIL) 25 MG tablet    Sig: Take 1 tablet (25 mg total) by mouth every 6 (six) hours as needed for anxiety.    Dispense:  20 tablet    Refill:  0    Order Specific Question:   Supervising Provider    Answer:   Raylene Everts [[id number]]   Rest and drink fluids Eat a well-balanced diet, and avoid excessive caffeine intake Continue with zoloft as prescribed.   Hydroxyzine prescribed.  Take as directed for anxiety.  DO NOT TAKE PRIOR TO DRIVING OR OPERATING HEAVY MACHINERY AS THIS MEDICATION MAY MAKE YOU DROWSY Some things you may try doing to help alleviate your symptoms include: keeping a journal, exercise, talking to a friend or relative, listening to music, going for a walk or hike outside, or other activities that you may find enjoyable Recommending further evaluation and management with PCP.  I have attached a list of local PCPs in the area that you may follow up Return or go to ER if you have any new  or worsening symptoms such as fever, chills, fatigue, worsening shortness of breath, wheezing, chest pain, nausea, vomiting, abdominal pain, changes in bowel or bladder habits, etc...   Reviewed expectations re: course of current medical issues. Questions answered. Outlined signs and symptoms indicating need for more acute intervention. Patient verbalized understanding. After Visit Summary given.   Lestine Box, PA-C 07/15/18 0097

## 2018-07-15 NOTE — ED Triage Notes (Signed)
Pt here for increased anxiety and depression; pt sts not taking zoloft as prescribed due to side effects; pt denies SI/HI

## 2018-07-15 NOTE — Discharge Instructions (Addendum)
Rest and drink fluids Eat a well-balanced diet, and avoid excessive caffeine intake Continue with zoloft as prescribed.   Hydroxyzine prescribed.  Take as directed for anxiety.  DO NOT TAKE PRIOR TO DRIVING OR OPERATING HEAVY MACHINERY AS THIS MEDICATION MAY MAKE YOU DROWSY Some things you may try doing to help alleviate your symptoms include: keeping a journal, exercise, talking to a friend or relative, listening to music, going for a walk or hike outside, or other activities that you may find enjoyable Recommending further evaluation and management with PCP.  I have attached a list of local PCPs in the area that you may follow up Return or go to ER if you have any new or worsening symptoms such as fever, chills, fatigue, worsening shortness of breath, wheezing, chest pain, nausea, vomiting, abdominal pain, changes in bowel or bladder habits, etc..Marland Kitchen

## 2018-07-18 ENCOUNTER — Ambulatory Visit (INDEPENDENT_AMBULATORY_CARE_PROVIDER_SITE_OTHER): Payer: PRIVATE HEALTH INSURANCE | Admitting: Family Medicine

## 2018-07-18 ENCOUNTER — Other Ambulatory Visit: Payer: Self-pay

## 2018-07-18 ENCOUNTER — Telehealth (INDEPENDENT_AMBULATORY_CARE_PROVIDER_SITE_OTHER): Payer: PRIVATE HEALTH INSURANCE

## 2018-07-18 ENCOUNTER — Encounter: Payer: Self-pay | Admitting: Family Medicine

## 2018-07-18 VITALS — BP 108/64 | HR 100 | Temp 99.1°F | Resp 12 | Ht 63.0 in | Wt 202.0 lb

## 2018-07-18 DIAGNOSIS — F419 Anxiety disorder, unspecified: Secondary | ICD-10-CM

## 2018-07-18 DIAGNOSIS — F322 Major depressive disorder, single episode, severe without psychotic features: Secondary | ICD-10-CM | POA: Diagnosis not present

## 2018-07-18 DIAGNOSIS — F321 Major depressive disorder, single episode, moderate: Secondary | ICD-10-CM

## 2018-07-18 NOTE — Progress Notes (Signed)
Subjective:     Patient ID: Meghan Carter, female   DOB: 12-15-1980, 38 y.o.   MRN: [id number]  Meghan Carter presents for New Patient (Initial Visit) (establish care)  Meghan Carter is a 38 y.o. female who presents today to establish care.  Was recently seen On Monday of this month in June 8 at urgent care secondary to having excessive anxiety and depression.   Patient has extensive history which includes anxiety, depression, miscarriage, removal of fallopian tubes, hemorrhoids, and pelvic pain among others.  Was placed on Zoloft, reports that she is having a hard time focusing on it so her dose was reduced to 25 from 50 mg.  Additionally she has been taking Atarax at night to help her sleep.  Has not really been taking it during the day as she is currently in school right now to get a political science degree at [employer] so that she can go to law school.  She reports that she has had anxiety and depression for the last 15 years.  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  Additionally she reports that she goes through intermittent anxiety and panic attacks secondary to her divorce.  This seems to be most of her driving factors with stress.  She has tried to implement better self-care factors such as exercising and retail therapy.  But this only resulted in temporary relief of symptoms.  She denies homicidal or suicidal ideations.    Complains of associated anhedonia, and difficulty sleeping.  Patient denies fever, chills, nausea, vomiting, chest pain, SOB, abdominal pain, changes in bowel or bladder habits.    Denies having fevers, chills, cough, shortness of breath, any other signs or symptoms of infection.  Past Medical, Surgical, Social History, Allergies, and Medications have been Reviewed.   Past Medical History:  Diagnosis Date  . Anemia   . Anxiety   . Atherosclerosis  of arteries   . Blood transfusion without reported diagnosis 2017   2 units, no reaction  . Depression   . Dysmenorrhea   . Endometrial polyp 02/11/2014  . Fecal occult blood test positive 01/27/2014  . Fibroid   . History of abnormal cervical Pap smear 02/11/2014  . HSV (herpes simplex virus) infection   . Hydrosalpinx    Bilateral  . Internal hemorrhoids   . Menorrhagia with regular cycle 01/27/2014  . Pelvic pain in female 01/27/2014  . Rectal bleeding 01/27/2014  . Vaginal Pap smear, abnormal    Past Surgical History:  Procedure Laterality Date  . CHROMOPERTUBATION  08/14/2017   Procedure: CHROMOPERTUBATION;  Surgeon: Governor Specking, MD;  Location: [facility];  Service: Gynecology;;  . COLONOSCOPY N/A 03/23/2014   SLF: 1. The examined terminal ileum appeared to be normal. 2. The left colon is redundant 3. Moderate sized internal hemorrhoids  . ECTOPIC PREGNANCY SURGERY Left 2005  . FLEXIBLE SIGMOIDOSCOPY N/A 04/01/2015   SLF: rectal bleeding due to large internal hemorrhoids 2. anemia due to rectal bleeding/memses/ poor iron intake.  Marland Kitchen FLEXIBLE SIGMOIDOSCOPY N/A 03/27/2015   Procedure: FLEXIBLE SIGMOIDOSCOPY;  Surgeon: Danie Binder, MD;  [facility];  Service: Endoscopy;  Laterality: N/A;  . HEMORRHOID BANDING N/A 03/23/2014   Procedure: HEMORRHOID BANDING;  Surgeon: Danie Binder, MD;  [facility];  Service: Endoscopy;  Laterality: N/A;  . HEMORRHOID BANDING  03/27/2015   Procedure: HEMORRHOID BANDING;  Surgeon: Danie Binder, MD;  [facility];  Service: Endoscopy;;  . HEMORRHOID SURGERY N/A 01/11/2018   Procedure: EXTENSIVE HEMORRHOIDECTOMY;  Surgeon: Aviva Signs, MD;  [facility]: [facility];  Service: General;  Laterality: N/A;  . HYSTEROSCOPY W/D&C N/A 06/16/2014   Procedure: DILATATION AND CURETTAGE /HYSTEROSCOPY;  Surgeon: Jonnie Kind, MD;  Location: [facility];  Service: Gynecology;  Laterality: N/A;  . LAPAROSCOPIC UNILATERAL SALPINGECTOMY Right  06/16/2014   Procedure: LAPAROSCOPIC UNILATERAL SALPINGECTOMY;  Surgeon: Jonnie Kind, MD;  [facility]: [facility];  Service: Gynecology;  Laterality: Right;  . LAPAROSCOPY N/A 08/14/2017   Procedure: OPERATIVE LAPAROSCOPY ,LEFT SALPINGECTOMY, LYSIS OF ADHESIONS;  Surgeon: Governor Specking, MD;  [facility]: [facility];  Service: Gynecology;  Laterality: N/A;  . POLYPECTOMY N/A 06/16/2014   Procedure: POLYPECTOMY (endometrial);  Surgeon: Jonnie Kind, MD;  [facility];  Service: Gynecology;  Laterality: N/A;   Social History   Socioeconomic History  . Marital status: Legally Separated    Spouse name: Not on file  . Number of children: Not on file  . Years of education: Not on file  . Highest education level: Not on file  Occupational History  . Not on file  Social Needs  . Financial resource strain: Not on file  . Food insecurity    Worry: Not on file    Inability: Not on file  . Transportation needs    Medical: Not on file    Non-medical: Not on file  Tobacco Use  . Smoking status: Light Tobacco Smoker    Packs/day: 0.25    Years: 27.00    Pack years: 6.75    Types: Cigarettes  . Smokeless tobacco: Never Used  Substance and Sexual Activity  . Alcohol use: Yes    Comment: "very little"  . Drug use: No  . Sexual activity: Never    Partners: Male    Birth control/protection: None  Lifestyle  . Physical activity    Days per week: Not on file    Minutes per session: Not on file  . Stress: Not on file  Relationships  . Social Herbalist on phone: Not on file    Gets together: Not on file    Attends religious service: Not on file    Active member of club or organization: Not on file    Attends meetings of clubs or organizations: Not on file    Relationship status: Not on file  . Intimate partner violence    Fear of current or ex partner: Not on file    Emotionally abused: Not on file    Physically abused: Not on file    Forced sexual activity: Not on file   Other Topics Concern  . Not on file  Social History Narrative  . Not on file    Outpatient Encounter Medications as of 07/18/2018  Medication Sig  . hydrOXYzine (ATARAX/VISTARIL) 25 MG tablet Take 1 tablet (25 mg total) by mouth every 6 (six) hours as needed for anxiety.  . sertraline (ZOLOFT) 100 MG tablet Take 25 mg by mouth daily.   . [DISCONTINUED] gabapentin (NEURONTIN) 400 MG capsule Take 1 capsule (400 mg total) by mouth 3 (three) times daily. (Patient not taking: Reported on 07/18/2018)  . [DISCONTINUED] Lidocaine-Hydrocortisone Ace 3-2.5 % KIT APPLY TO RECTUM QID FOR 2 WEEKS (Patient not taking: Reported on 07/18/2018)  . [DISCONTINUED] metroNIDAZOLE (FLAGYL) 500 MG tablet Take 1 tablet (500 mg total) by mouth 2 (two) times daily. (  Patient not taking: Reported on 07/15/2018)  . [DISCONTINUED] metroNIDAZOLE (METROGEL VAGINAL) 0.75 % vaginal gel Nightly x 5 nights (Patient not taking: Reported on 07/18/2018)  . [DISCONTINUED] oxyCODONE-acetaminophen (PERCOCET) 10-325 MG tablet Take 1 tablet by mouth every 6 (six) hours as needed for pain. (Patient not taking: Reported on 07/15/2018)  . [DISCONTINUED] psyllium (REGULOID) 0.52 g capsule Take 0.52 g by mouth 2 (two) times daily.   No facility-administered encounter medications on file as of 07/18/2018.    Allergies  Allergen Reactions  . Doxycycline Hyclate Rash    Review of Systems  Constitutional: Positive for activity change and appetite change. Negative for chills and fever.  HENT: Negative.   Eyes: Negative.  Negative for visual disturbance.  Respiratory: Negative.  Negative for cough and shortness of breath.   Cardiovascular: Negative.  Negative for chest pain, palpitations and leg swelling.  Gastrointestinal: Negative.   Endocrine: Negative.  Negative for polydipsia, polyphagia and polyuria.  Genitourinary: Negative.   Musculoskeletal: Negative.   Skin: Negative.   Allergic/Immunologic: Negative.   Neurological: Negative.   Negative for dizziness, weakness and headaches.  Hematological: Negative.   Psychiatric/Behavioral: Positive for sleep disturbance.       Extreme depression, anxiety  All other systems reviewed and are negative.      Objective:     BP 108/64   Pulse 100   Temp 99.1 F (37.3 C) (Oral)   Resp 12   Ht 5' 3" (1.6 m)   Wt 202 lb (91.6 kg)   SpO2 98%   BMI 35.78 kg/m   Physical Exam Vitals signs and nursing note reviewed.  Constitutional:      Appearance: Normal appearance. She is obese.  HENT:     Head: Normocephalic and atraumatic.     Right Ear: External ear normal.     Left Ear: External ear normal.     Nose: Nose normal.  Eyes:     General:        Right eye: No discharge.     Conjunctiva/sclera: Conjunctivae normal.  Neck:     Musculoskeletal: Normal range of motion.  Cardiovascular:     Rate and Rhythm: Normal rate.     Pulses: Normal pulses.     Heart sounds: Normal heart sounds.  Pulmonary:     Effort: Pulmonary effort is normal.     Breath sounds: Normal breath sounds.  Abdominal:     General: Bowel sounds are normal.  Musculoskeletal: Normal range of motion.  Skin:    General: Skin is warm.  Neurological:     Mental Status: She is alert and oriented to person, place, and time.  Psychiatric:        Mood and Affect: Mood is anxious and depressed. Affect is tearful.        Speech: Speech normal.        Behavior: Behavior normal. Behavior is cooperative.        Thought Content: Thought content is not paranoid or delusional. Thought content does not include homicidal or suicidal ideation. Thought content does not include homicidal or suicidal plan.        Cognition and Memory: Cognition normal.        Judgment: Judgment is not impulsive.     Comments: Patient is anxious and depressed and tearful during conversation and visit today.        Assessment and Plan       1. Depression, major, single episode, severe ([place]) Was seen today by [facility]. Changed  medicine to see if that can provide some relief without concentration issues.  Reviewed side effects, risks and benefits of medication.  Patient acknowledged agreement and understanding of the plan.    - escitalopram (LEXAPRO) 10 MG tablet; Take 1 tablet (10 mg total) by mouth daily.  Dispense: 30 tablet; Refill: 3  2. Anxiety Combination of anxiety with depression. Will change medication to see if she can find some relief. I also have referred her to [facility]. Hoping therapy can help her as well.  - escitalopram (LEXAPRO) 10 MG tablet; Take 1 tablet (10 mg total) by mouth daily.  Dispense: 30 tablet; Refill: 3   Follow Up: 1 month        Perlie Mayo, DNP, AGNP-BC [place], [place], [place] [address] Office Hours: Mon-Thurs 8 am-5 pm; Fri 8 am-12 pm Office Phone:  [phone]  Office Fax: [phone]

## 2018-07-18 NOTE — Patient Instructions (Addendum)
    Thank you for coming into the office today. I appreciate the opportunity to provide you with the care for your health and wellness. Today we discussed: depression and school  Follow Up in 1 month  Please start taking Lexapro 10mg  daily. You can stop take the Zoloft. Lets see if Lexapro can help with emotions and not interfere with schooling. And continue to take hydroxyzine at night and as needed during the day (but not if going to be driving)  Continue with the virtual therapy sessions.   [place] YOUR HANDS WELL AND FREQUENTLY. AVOID TOUCHING YOUR FACE, UNLESS YOUR HANDS ARE FRESHLY WASHED.  GET FRESH AIR DAILY. STAY HYDRATED WITH WATER.   It was a pleasure to see you and I look forward to continuing to work together on your health and well-being. Please do not hesitate to call the office if you need care or have questions about your care.  Have a wonderful day and week. With Gratitude, Cherly Beach, DNP, AGNP-BC
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
## 2018-07-20 ENCOUNTER — Encounter: Payer: Self-pay | Admitting: Family Medicine

## 2018-07-20 MED ORDER — ESCITALOPRAM OXALATE 10 MG PO TABS: 10.0000 mg | ORAL_TABLET | Freq: Every day | ORAL | 3 refills | Status: DC

## 2018-07-23 ENCOUNTER — Telehealth: Payer: Self-pay | Admitting: *Deleted

## 2018-07-23 NOTE — Telephone Encounter (Signed)
Pt called back said you could return her call as she was available now

## 2018-07-23 NOTE — Telephone Encounter (Signed)
Pt called stating she saw Jarrett Soho on 07-18-18. She said she was supposed to continue to talk to the counsler every two weeks but she had no idea how to set it up and how to get in touch with the counsler. Wanted someone to call her back with this information as she had not received her paperwork at the moment.

## 2018-07-23 NOTE — Telephone Encounter (Signed)
Returned patients call. No answer. Left generic message requesting call back.  

## 2018-07-24 NOTE — Telephone Encounter (Signed)
Returned patients call. No answer. Left generic message requesting call back.  
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
## 2018-07-24 NOTE — Telephone Encounter (Signed)
Spoke with patient and let her know that counselor should call her within 2 weeks and if she doesn't she can call us back and we would see about where to go from there.

## 2018-08-07 ENCOUNTER — Telehealth: Payer: Self-pay

## 2018-08-07 NOTE — Telephone Encounter (Signed)
[facility] - Left Message  

## 2018-08-14 ENCOUNTER — Telehealth: Payer: Self-pay

## 2018-08-14 NOTE — Telephone Encounter (Signed)
2nd attempt - [facility]   

## 2018-08-15 ENCOUNTER — Ambulatory Visit: Payer: PRIVATE HEALTH INSURANCE | Admitting: Family Medicine

## 2018-08-15 ENCOUNTER — Encounter (INDEPENDENT_AMBULATORY_CARE_PROVIDER_SITE_OTHER): Payer: Self-pay

## 2018-08-15 ENCOUNTER — Other Ambulatory Visit: Payer: Self-pay

## 2018-08-19 ENCOUNTER — Telehealth: Payer: Self-pay

## 2018-08-19 DIAGNOSIS — F322 Major depressive disorder, single episode, severe without psychotic features: Secondary | ICD-10-CM

## 2018-08-19 DIAGNOSIS — F419 Anxiety disorder, unspecified: Secondary | ICD-10-CM

## 2018-08-19 NOTE — BH Specialist Note (Signed)
[place] Telephone Follow-up  MRN: [id number] NAME: Meghan Carter Date: 08/20/2018  Start time: Start Time: 1900 End time: Stop Time: 1930 Total time: Total Time in Minutes (Visit): 30 Call number: Visit Number: 2- Second Visit   PHQ-9 Scores:  Depression screen [facility] 2/9 2018/08/20 07/18/2018  Decreased Interest 2 0  Down, Depressed, Hopeless 2 3  PHQ - 2 Score 4 3  Altered sleeping 1 3  Tired, decreased energy 1 3  Change in appetite 1 1  Feeling bad or failure about yourself  3 3  Trouble concentrating 1 3  Moving slowly or fidgety/restless 0 0  Suicidal thoughts 0 0  PHQ-9 Score 11 16  Difficult doing work/chores Somewhat difficult Very difficult    GAD-7 Scores:  GAD 7 : Generalized Anxiety Score 08/20/2018 07/18/2018  Nervous, Anxious, on Edge 2 3  Control/stop worrying 2 3  Worry too much - different things 2 2  Trouble relaxing 1 2  Restless 1 1  Easily annoyed or irritable 1 2  Afraid - awful might happen 1 3  Total GAD 7 Score 10 16  Anxiety Difficulty Somewhat difficult -    Stress Current stressors: Current Stressors: Other (Comment)(Depression due to divorce in March 2020.  Overwhelmed due to taking college classes.) Sleep: Sleep: No problems Appetite: Appetite: No problems Coping ability: Coping ability: Normal  Patient taking medications as prescribed: Patient taking medications as prescribed: Yes    Current medications:  Outpatient Encounter Medications as of Aug 20, 2018  Medication Sig  . escitalopram (LEXAPRO) 10 MG tablet Take 1 tablet (10 mg total) by mouth daily.  . hydrOXYzine (ATARAX/VISTARIL) 25 MG tablet Take 1 tablet (25 mg total) by mouth every 6 (six) hours as needed for anxiety.  . sertraline (ZOLOFT) 100 MG tablet Take 25 mg by mouth daily.    No facility-administered encounter medications on file as of Aug 20, 2018.      Self-harm Behaviors Risk Assessment Self-harm risk factors: Self-harm risk factors: (None  Reported) Patient endorses recent thoughts of harming self: Have you recently had any thoughts about harming yourself?: No    [place] Suicide Severity Rating Scale: No flowsheet data found. C-SRSS 07/18/2018 20-Aug-2018  1. Wish to be Dead No No  2. Suicidal Thoughts No No  6. Suicide Behavior Question No No     Danger to Others Risk Assessment Danger to others risk factors: Danger to Others Risk Factors: No risk factors noted Patient endorses recent thoughts of harming others: Notification required: No need or identified person      Substance Use Assessment Patient recently consumed alcohol:  None Reported   Alcohol Use Disorder Identification Test (AUDIT):  Alcohol Use Disorder Test (AUDIT) 07/18/2018  1. How often do you have a drink containing alcohol? 0  2. How many drinks containing alcohol do you have on a typical day when you are drinking? 0  3. How often do you have six or more drinks on one occasion? 0  AUDIT-C Score 0  Alcohol Brief Interventions/Follow-up AUDIT Score <7 follow-up not indicated   Patient recently used drugs:  None Reported    Goals, Interventions and Follow-up Plan Goals: Increase healthy adjustment to current life circumstances Interventions: Behavioral Activation and Supportive Counseling Follow-up Plan: [facility] Phone Follow UP   Summary:   Patient is a 38 year old female.  Patient has a decrease in her PHQ and GAD score.   Stressor:  Patient reports that her depression has decreased due to being able to spend  more time with her friends.  Patient reports that she recently went on a vacation trip with friends to the mountains.  Patient reports that she does not feel ready to begin dating.    Patient reports that she is still experiencing feeling of loneliness associated with the divorce from her husband.  Patient reports that if she begins to date again then she may lose herself. Writer discussed mindfulness.  Writer discussed the possibility of  participating in the IOP program.  Writer processed patents feelings about having a miscarriage several years ago.   Patient reports that being busy at school help to decrease her level of depression.  Patient will graduate from [employer].  Patient plan on attending [employer] in order to attend law school.   Patient reports improved sleep.   Patient reports utilizing positive coping mechanisms when she feels depressed or anxious.  Patient reports that she has begun to exercise.    Medication:  Meghan Carter reports that she takes sertraline 25mg .and Zoloft.  Patent denies any negative side effects.  Patient reports compliance with taking the medication.      During the next session:  Writer down what she is feeling before she is experiencing an anxiety attack.  Patient reports that she got an elliptical machine       Meghan Carter, Meghan Carter, LCAS-A

## 2018-08-22 ENCOUNTER — Ambulatory Visit: Payer: PRIVATE HEALTH INSURANCE | Admitting: Family Medicine

## 2018-08-27 ENCOUNTER — Encounter: Payer: Self-pay | Admitting: Emergency Medicine

## 2018-08-27 ENCOUNTER — Ambulatory Visit
Admission: EM | Admit: 2018-08-27 | Discharge: 2018-08-27 | Disposition: A | Discharge status: Home / Self Care | Payer: PRIVATE HEALTH INSURANCE | Attending: Emergency Medicine | Admitting: Emergency Medicine

## 2018-08-27 ENCOUNTER — Other Ambulatory Visit: Payer: Self-pay

## 2018-08-27 DIAGNOSIS — N898 Other specified noninflammatory disorders of vagina: Secondary | ICD-10-CM | POA: Diagnosis not present

## 2018-08-27 DIAGNOSIS — Z113 Encounter for screening for infections with a predominantly sexual mode of transmission: Secondary | ICD-10-CM | POA: Diagnosis not present

## 2018-08-27 DIAGNOSIS — Z711 Person with feared health complaint in whom no diagnosis is made: Secondary | ICD-10-CM | POA: Diagnosis present

## 2018-08-27 DIAGNOSIS — Z202 Contact with and (suspected) exposure to infections with a predominantly sexual mode of transmission: Secondary | ICD-10-CM | POA: Diagnosis not present

## 2018-08-27 DIAGNOSIS — R300 Dysuria: Secondary | ICD-10-CM | POA: Insufficient documentation

## 2018-08-27 LAB — POCT URINALYSIS DIP (MANUAL ENTRY)
Bilirubin, UA: NEGATIVE
Blood, UA: NEGATIVE
Glucose, UA: NEGATIVE mg/dL
Ketones, POC UA: NEGATIVE mg/dL
Leukocytes, UA: NEGATIVE
Nitrite, UA: NEGATIVE
Protein Ur, POC: NEGATIVE mg/dL
Spec Grav, UA: >=1.03 — AB (ref 1.010–1.025)
Urobilinogen, UA: 0.2 E.U./dL
pH, UA: 6 (ref 5.0–8.0)

## 2018-08-27 LAB — POCT URINE PREGNANCY: Preg Test, Ur: NEGATIVE

## 2018-08-27 MED ORDER — AZITHROMYCIN 500 MG PO TABS
1000.0000 mg | ORAL_TABLET | Freq: Once | ORAL | Status: AC
  Administered 2018-08-27: 1000 mg via ORAL

## 2018-08-27 MED ORDER — METRONIDAZOLE 500 MG PO TABS: 500.0000 mg | ORAL_TABLET | Freq: Two times a day (BID) | ORAL | 0 refills | Status: DC

## 2018-08-27 MED ORDER — CEFTRIAXONE SODIUM 250 MG IJ SOLR
250.0000 mg | Freq: Once | INTRAMUSCULAR | Status: AC
  Administered 2018-08-27: 250 mg via INTRAMUSCULAR

## 2018-08-27 MED ORDER — FLUCONAZOLE 200 MG PO TABS: ORAL_TABLET | ORAL | 0 refills | Status: DC

## 2018-08-27 NOTE — Discharge Instructions (Addendum)
Urine did not show sign of infection Urine pregnancy negative Vaginal self-swab obtained.  We will follow up with you regarding abnormal results Given rocephin 250mg  injection and azithromycin 1g in office HIV/ syphilis testing today Prescribed metronidazole 500 mg twice daily for 7 days (do not take while consuming alcohol and/or if breastfeeding) Prescribed diflucan 200 mg once daily and then second dose 72 hours later Take medications as prescribed and to completion If tests results are positive, please abstain from sexual activity until you and your partner(s) have been treated Follow up with PCP or [facility] if symptoms persists Return here or go to ER if you have any new or worsening symptoms fever, chills, nausea, vomiting, abdominal or pelvic pain, painful intercourse, vaginal discharge, vaginal bleeding, persistent symptoms despite treatment, etc..Marland Kitchen

## 2018-08-27 NOTE — ED Triage Notes (Signed)
Patient states she is having burning and dark vaginal discharge since Saturday night.

## 2018-08-27 NOTE — ED Provider Notes (Signed)
[place]   [id number] 08/27/18 Arrival Time: 1629   BB:[id number] DISCHARGE  SUBJECTIVE:  Meghan Carter is a 38 y.o. female who presents with complaints of vaginal/ urinary burning, vaginal itching, vaginal odor, and vaginal discharge x 3 days.  Admits to recent sexual activity prior to symptoms.  Last sex prior to that was in April of 2020.  Patient has been sexually active with 2 female partners over the past 6 months.  Describes discharge as thin and brown.  She has tried OTC yeast medication without relief.  She reports worsening symptoms with urination.  She reports similar symptoms in the past and was diagnosed with BV.  Complains of urinary frequency and urgency as well.  She denies fever, chills, nausea, vomiting, abdominal or pelvic pain, vaginal bleeding, dyspareunia, vaginal rashes or lesions.   Patient's last menstrual period was 08/13/2018 (approximate). Current birth control method: None, tubal ligation  ROS: As per HPI.  All other pertinent ROS negative.     Past Medical History:  Diagnosis Date  . Anemia   . Anxiety   . Atherosclerosis of arteries   . Blood transfusion without reported diagnosis 2017   2 units, no reaction  . Depression   . Dysmenorrhea   . Endometrial polyp 02/11/2014  . Fecal occult blood test positive 01/27/2014  . Fibroid   . History of abnormal cervical Pap smear 02/11/2014  . HSV (herpes simplex virus) infection   . Hydrosalpinx    Bilateral  . Internal hemorrhoids   . Menorrhagia with regular cycle 01/27/2014  . Pelvic pain in female 01/27/2014  . Rectal bleeding 01/27/2014  . Vaginal Pap smear, abnormal    Past Surgical History:  Procedure Laterality Date  . CHROMOPERTUBATION  08/14/2017   Procedure: CHROMOPERTUBATION;  Surgeon: Governor Specking, MD;  Location: [facility];  Service: Gynecology;;  . COLONOSCOPY N/A 03/23/2014   SLF: 1. The examined terminal ileum appeared to be normal. 2. The left colon is redundant 3. Moderate  sized internal hemorrhoids  . ECTOPIC PREGNANCY SURGERY Left 2005  . FLEXIBLE SIGMOIDOSCOPY N/A 04/01/2015   SLF: rectal bleeding due to large internal hemorrhoids 2. anemia due to rectal bleeding/memses/ poor iron intake.  Marland Kitchen FLEXIBLE SIGMOIDOSCOPY N/A 03/27/2015   Procedure: FLEXIBLE SIGMOIDOSCOPY;  Surgeon: Danie Binder, MD;  Location: [facility];  Service: Endoscopy;  Laterality: N/A;  . HEMORRHOID BANDING N/A 03/23/2014   Procedure: HEMORRHOID BANDING;  Surgeon: Danie Binder, MD;  Location: [facility];  Service: Endoscopy;  Laterality: N/A;  . HEMORRHOID BANDING  03/27/2015   Procedure: HEMORRHOID BANDING;  Surgeon: Danie Binder, MD;  Location: [facility];  Service: Endoscopy;;  . HEMORRHOID SURGERY N/A 01/11/2018   Procedure: EXTENSIVE HEMORRHOIDECTOMY;  Surgeon: Aviva Signs, MD;  Location: [facility];  Service: General;  Laterality: N/A;  . HYSTEROSCOPY W/D&C N/A 06/16/2014   Procedure: DILATATION AND CURETTAGE /HYSTEROSCOPY;  Surgeon: Jonnie Kind, MD;  Location: [facility];  Service: Gynecology;  Laterality: N/A;  . LAPAROSCOPIC UNILATERAL SALPINGECTOMY Right 06/16/2014   Procedure: LAPAROSCOPIC UNILATERAL SALPINGECTOMY;  Surgeon: Jonnie Kind, MD;  Location: [facility];  Service: Gynecology;  Laterality: Right;  . LAPAROSCOPY N/A 08/14/2017   Procedure: OPERATIVE LAPAROSCOPY ,LEFT SALPINGECTOMY, LYSIS OF ADHESIONS;  Surgeon: Governor Specking, MD;  Location: [facility];  Service: Gynecology;  Laterality: N/A;  . POLYPECTOMY N/A 06/16/2014   Procedure: POLYPECTOMY (endometrial);  Surgeon: Jonnie Kind, MD;  Location: [facility];  Service: Gynecology;  Laterality: N/A;  Allergies  Allergen Reactions  . Doxycycline Hyclate Rash   No current facility-administered medications on file prior to encounter.    Current Outpatient Medications on File Prior to Encounter  Medication Sig Dispense Refill  . escitalopram (LEXAPRO) 10 MG tablet Take 1 tablet (10 mg total) by mouth daily. 30 tablet  3  . sertraline (ZOLOFT) 100 MG tablet Take 25 mg by mouth daily.   2    Social History   Socioeconomic History  . Marital status: Legally Separated    Spouse name: Not on file  . Number of children: Not on file  . Years of education: Not on file  . Highest education level: Not on file  Occupational History  . Not on file  Social Needs  . Financial resource strain: Not on file  . Food insecurity    Worry: Not on file    Inability: Not on file  . Transportation needs    Medical: Not on file    Non-medical: Not on file  Tobacco Use  . Smoking status: Light Tobacco Smoker    Packs/day: 0.25    Years: 27.00    Pack years: 6.75    Types: Cigarettes  . Smokeless tobacco: Never Used  Substance and Sexual Activity  . Alcohol use: Yes    Comment: "very little"  . Drug use: No  . Sexual activity: Yes    Partners: Male    Birth control/protection: None  Lifestyle  . Physical activity    Days per week: Not on file    Minutes per session: Not on file  . Stress: Not on file  Relationships  . Social Herbalist on phone: Not on file    Gets together: Not on file    Attends religious service: Not on file    Active member of club or organization: Not on file    Attends meetings of clubs or organizations: Not on file    Relationship status: Not on file  . Intimate partner violence    Fear of current or ex partner: Not on file    Emotionally abused: Not on file    Physically abused: Not on file    Forced sexual activity: Not on file  Other Topics Concern  . Not on file  Social History Narrative  . Not on file   Family History  Problem Relation Age of Onset  . Diabetes Father   . Diabetes Paternal Grandmother   . Diabetes Paternal Grandfather     OBJECTIVE:  Vitals:   08/27/18 1640 08/27/18 1643  BP:  117/79  Pulse:  89  Resp:  16  Temp:  98.3 F (36.8 C)  TempSrc:  Oral  SpO2:  95%  Weight: 201 lb (91.2 kg)   Height: 5\' 3"  (1.6 m)      General  appearance: Alert, NAD, appears stated age Head: NCAT Eye: PERRL; EOM I grossly Throat: lips intact, tolerating own secretions without difficulty Lungs: CTA bilaterally without adventitious breath sounds Heart: regular rate and rhythm.   Back: no CVA tenderness Abdomen: soft, non-tender; bowel sounds normal; no guarding GU: deferred Skin: warm and dry Psychological:  Alert and cooperative. Normal mood and affect.  LABS:  Results for orders placed or performed during the hospital encounter of 08/27/18 (from the past 24 hour(s))  POCT urine pregnancy     Status: None   Collection Time: 08/27/18  5:15 PM  Result Value Ref Range   Preg Test, Ur Negative Negative  POCT urinalysis dipstick     Status: Abnormal   Collection Time: 08/27/18  5:17 PM  Result Value Ref Range   Color, UA yellow yellow   Clarity, UA clear clear   Glucose, UA negative negative mg/dL   Bilirubin, UA negative negative   Ketones, POC UA negative negative mg/dL   Spec Grav, UA >=1.030 (A) 1.010 - 1.025   Blood, UA negative negative   pH, UA 6.0 5.0 - 8.0   Protein Ur, POC negative negative mg/dL   Urobilinogen, UA 0.2 0.2 or 1.0 E.U./dL   Nitrite, UA Negative Negative   Leukocytes, UA Negative Negative     ASSESSMENT & PLAN:  1. Vaginal discharge   2. Vaginal odor   3. Burning with urination   4. Concern about STD in female without diagnosis     Meds ordered this encounter  Medications  . cefTRIAXone (ROCEPHIN) injection 250 mg    Order Specific Question:   Antibiotic Indication:    Answer:   STD  . azithromycin (ZITHROMAX) tablet 1,000 mg  . metroNIDAZOLE (FLAGYL) 500 MG tablet    Sig: Take 1 tablet (500 mg total) by mouth 2 (two) times daily.    Dispense:  14 tablet    Refill:  0    Order Specific Question:   Supervising Provider    Answer:   Raylene Everts [[id number]]  . fluconazole (DIFLUCAN) 200 MG tablet    Sig: Take one dose by mouth, wait 72 hours, and then take second dose by mouth     Dispense:  2 tablet    Refill:  0    Order Specific Question:   Supervising Provider    Answer:   Raylene Everts [[id number]]    Pending: Labs Reviewed  RPR  HIV ANTIBODY (ROUTINE TESTING W REFLEX)  POCT URINE PREGNANCY  CERVICOVAGINAL ANCILLARY ONLY   Urine did not show sign of infection Urine pregnancy negative Vaginal self-swab obtained.  We will follow up with you regarding abnormal results Given rocephin 250mg  injection and azithromycin 1g in office HIV/ syphilis testing today Prescribed metronidazole 500 mg twice daily for 7 days (do not take while consuming alcohol and/or if breastfeeding) Prescribed diflucan 200 mg once daily and then second dose 72 hours later Take medications as prescribed and to completion If tests results are positive, please abstain from sexual activity until you and your partner(s) have been treated Follow up with PCP or [facility] if symptoms persists Return here or go to ER if you have any new or worsening symptoms fever, chills, nausea, vomiting, abdominal or pelvic pain, painful intercourse, vaginal discharge, vaginal bleeding, persistent symptoms despite treatment, etc...  Reviewed expectations re: course of current medical issues. Questions answered. Outlined signs and symptoms indicating need for more acute intervention. Patient verbalized understanding. After Visit Summary given.       Lestine Box, PA-C 08/27/18 1733

## 2018-08-28 LAB — HIV ANTIBODY (ROUTINE TESTING W REFLEX): HIV Screen 4th Generation wRfx: NONREACTIVE

## 2018-08-28 LAB — RPR: RPR Ser Ql: NONREACTIVE

## 2018-08-30 LAB — CERVICOVAGINAL ANCILLARY ONLY
Bacterial vaginitis: NEGATIVE
Candida vaginitis: POSITIVE — AB
Chlamydia: NEGATIVE
Neisseria Gonorrhea: NEGATIVE
Trichomonas: NEGATIVE

## 2018-09-03 ENCOUNTER — Telehealth: Payer: Self-pay

## 2018-09-03 NOTE — Telephone Encounter (Signed)
[facility] - Patient reports that a family member died and she is on her way to a funeral in [place] with her family.  Patient  reports that she will call me back tomorrow.

## 2018-09-10 ENCOUNTER — Telehealth: Payer: Self-pay

## 2018-09-10 DIAGNOSIS — F322 Major depressive disorder, single episode, severe without psychotic features: Secondary | ICD-10-CM

## 2018-09-10 DIAGNOSIS — F419 Anxiety disorder, unspecified: Secondary | ICD-10-CM

## 2018-09-10 NOTE — BH Specialist Note (Signed)
[place] Telephone Follow-up  MRN: [id number] NAME: Meghan Carter Date: October 08, 2018  Start time:   End time:  1500 Total time:  37 Call number: Visit Number: 3- Third Visit  Reason for call today: Reason for Contact: PHQ9-4 weeks  PHQ-9 Scores:  Depression screen [facility] 2/9 2018/10/08 09-16-2018 07/18/2018  Decreased Interest 2 2 0  Down, Depressed, Hopeless 2 2 3   PHQ - 2 Score 4 4 3   Altered sleeping 1 1 3   Tired, decreased energy 1 1 3   Change in appetite 0 1 1  Feeling bad or failure about yourself  2 3 3   Trouble concentrating 2 1 3   Moving slowly or fidgety/restless 1 0 0  Suicidal thoughts 0 0 0  PHQ-9 Score 11 11 16   Difficult doing work/chores Somewhat difficult Somewhat difficult Very difficult    GAD-7 Scores:  GAD 7 : Generalized Anxiety Score 10/08/18 Sep 16, 2018 07/18/2018  Nervous, Anxious, on Edge 1 2 3   Control/stop worrying 1 2 3   Worry too much - different things 1 2 2   Trouble relaxing 1 1 2   Restless 1 1 1   Easily annoyed or irritable 1 1 2   Afraid - awful might happen 1 1 3   Total GAD 7 Score 7 10 16   Anxiety Difficulty Somewhat difficult Somewhat difficult -    Stress Current stressors: Current Stressors: Family death Sleep: Sleep: Decreased Appetite: Appetite: Decreased Coping ability: Coping ability: Normal Patient taking medications as prescribed: Patient taking medications as prescribed: Yes    Current medications:  Outpatient Encounter Medications as of 10-08-2018  Medication Sig  . escitalopram (LEXAPRO) 10 MG tablet Take 1 tablet (10 mg total) by mouth daily.  . fluconazole (DIFLUCAN) 200 MG tablet Take one dose by mouth, wait 72 hours, and then take second dose by mouth  . metroNIDAZOLE (FLAGYL) 500 MG tablet Take 1 tablet (500 mg total) by mouth 2 (two) times daily.  . sertraline (ZOLOFT) 100 MG tablet Take 25 mg by mouth daily.    No facility-administered encounter medications on file as of 2018-10-08.      Self-harm  Behaviors Risk Assessment Self-harm risk factors: Self-harm risk factors: (None Reported) Patient endorses recent thoughts of harming self: Have you recently had any thoughts about harming yourself?: No  [place] Suicide Severity Rating Scale: No flowsheet data found. C-SRSS 07/18/2018 Sep 16, 2018 2018/10/08  1. Wish to be Dead No No No  2. Suicidal Thoughts No No No  6. Suicide Behavior Question No No No     Danger to Others Risk Assessment Danger to others risk factors: Danger to Others Risk Factors: No risk factors noted Patient endorses recent thoughts of harming others: Notification required: No need or identified person  Dynamic Appraisal of Situational Aggression (DASA): No flowsheet data found.   Substance Use Assessment Patient recently consumed alcohol:  None Repoted  Alcohol Use Disorder Identification Test (AUDIT):  Alcohol Use Disorder Test (AUDIT) 07/18/2018  1. How often do you have a drink containing alcohol? 0  2. How many drinks containing alcohol do you have on a typical day when you are drinking? 0  3. How often do you have six or more drinks on one occasion? 0  AUDIT-C Score 0  Alcohol Brief Interventions/Follow-up AUDIT Score <7 follow-up not indicated   Patient recently used drugs:  None Reported     Goals, Interventions and Follow-up Plan Goals: Increase healthy adjustment to current life circumstances Interventions: Behavioral Activation and Supportive Counseling Follow-up Plan: [facility] Phone Follow  UP   Summary:   Patient is a 38 year old female.  Patient PHQ score remains the same at 11 and her GAD score decreased to 7.     Supportive Counseling and Behavior Activation:  Patient reports that her 2 year old cousin died, and they do not know how he died.  Patient reports that his death may have been associated with his asthma.  Patient reports that she has been spending time with her cousins' mother.   Writer discussed the stages of grief.  Patient reports  that her family pulled their financial resources together in order to help pay for the funeral.  Patient reports that her anxiety level has declined because was able to assist with her cousin's funeral.    Patient reports that she will be taking classes on line for the upcoming semester.  Patient praised patient for allowing herself to being to date again.  Patient reports that she feels as if she is able to put her feeling aside that she had for her ex-husband and move on.     Medication:  Aimar denies any negative side effects with her psychiatric medication.  Amunique has been compliant with her psychiatric medication    During the next session:  Patient will continue to exercise on her exercise machine.    Graciella Freer [place], LCAS-A

## 2018-09-24 ENCOUNTER — Telehealth: Payer: Self-pay

## 2018-09-24 NOTE — Telephone Encounter (Signed)
[facility] - Left Message  

## 2018-10-01 ENCOUNTER — Telehealth: Payer: PRIVATE HEALTH INSURANCE | Admitting: Nurse Practitioner

## 2018-10-01 DIAGNOSIS — B009 Herpesviral infection, unspecified: Secondary | ICD-10-CM | POA: Diagnosis not present

## 2018-10-01 MED ORDER — VALACYCLOVIR HCL 1 G PO TABS: ORAL_TABLET | ORAL | 0 refills | Status: DC

## 2018-10-01 NOTE — Progress Notes (Signed)
We are sorry that you are not feeling well.  Here is how we plan to help!  Based on what you have shared with me it does look like you have a viral infection.    Most cold sores or fever blisters are small fluid filled blisters around the mouth caused by herpes simplex virus.  The most common strain of the virus causing cold sores is herpes simplex virus 1.  It can be spread by skin contact, sharing eating utensils, or even sharing towels.  Cold sores are contagious to other people until dry. (Approximately 5-7 days).  Wash your hands. You can spread the virus to your eyes through handling your contact lenses after touching the lesions.  Most people experience pain at the sight or tingling sensations in their lips that may begin before the ulcers erupt.  Herpes simplex is treatable but not curable.  It may lie dormant for a long time and then reappear due to stress or prolonged sun exposure.  Many patients have success in treating their cold sores with an over the counter topical called Abreva.  You may apply the cream up to 5 times daily (maximum 10 days) until healing occurs.  If you would like to use an oral antiviral medication to speed the healing of your cold sore, I have sent a prescription to your local pharmacy Valacyclovir 2 gm twice daily for 1 day    We do not treat reoccurring problems in an e visit. I have rx you meds to take care of your currently outbreak. You will need  To get refills or prophylactic meds from your PCP.  HOME CARE:   Wash your hands frequently.  Do not pick at or rub the sore.  Don't open the blisters.  Avoid kissing other people during this time.  Avoid sharing drinking glasses, eating utensils, or razors.  Do not handle contact lenses unless you have thoroughly washed your hands with soap and warm water!  Avoid oral sex during this time.  Herpes from sores on your mouth can spread to your partner's genital area.  Avoid contact with anyone who has  eczema or a weakened immune system.  Cold sores are often triggered by exposure to intense sunlight, use a lip balm containing a sunscreen (SPF 30 or higher).  GET HELP RIGHT AWAY IF:   Blisters look infected.  Blisters occur near or in the eye.  Symptoms last longer than 10 days.  Your symptoms become worse.  MAKE SURE YOU:   Understand these instructions.  Will watch your condition.  Will get help right away if you are not doing well or get worse.    Your e-visit answers were reviewed by a board certified advanced clinical practitioner to complete your personal care plan.  Depending upon the condition, your plan could have  Included both over the counter or prescription medications.    Please review your pharmacy choice.  Be sure that the pharmacy you have chosen is open so that you can pick up your prescription now.  If there is a problem you can message your provider in [place] to have the prescription routed to another pharmacy.    Your safety is important to [place].  If you have drug allergies check our prescription carefully.  For the next 24 hours you can use MyChart to ask questions about today's visit, request a non-urgent call back, or ask for a work or school excuse from your e-visit provider.  You will get an email  in the next two days asking about your experience.  I hope that your e-visit has been valuable and will speed your recovery.  5-10 minutes spent reviewing and documenting in chart.

## 2018-10-23 ENCOUNTER — Telehealth: Payer: Self-pay

## 2018-10-23 NOTE — Telephone Encounter (Signed)
2nd attempt - [facility]   

## 2018-10-29 ENCOUNTER — Telehealth: Payer: Self-pay

## 2018-10-29 DIAGNOSIS — F321 Major depressive disorder, single episode, moderate: Secondary | ICD-10-CM

## 2018-10-29 DIAGNOSIS — F419 Anxiety disorder, unspecified: Secondary | ICD-10-CM

## 2018-10-29 NOTE — BH Specialist Note (Signed)
[place] Telephone Follow-up  MRN: [id number] NAME: Meghan Carter Date: 11-22-2018  Start time: Start Time: 1400 End time: Stop Time: 1435 Total time: Total Time in Minutes (Visit): 35 Call number: Visit Number: 3- Third Visit  Reason for call today: Reason for Contact: PHQ9-4 weeks    PHQ-9 Scores:  Depression screen [facility] 2/9 22-Nov-2018 09/10/2018 08/19/2018 07/18/2018  Decreased Interest 1 2 2  0  Down, Depressed, Hopeless 1 2 2 3   PHQ - 2 Score 2 4 4 3   Altered sleeping 0 1 1 3   Tired, decreased energy 1 1 1 3   Change in appetite 0 0 1 1  Feeling bad or failure about yourself  2 2 3 3   Trouble concentrating 1 2 1 3   Moving slowly or fidgety/restless 0 1 0 0  Suicidal thoughts 0 0 0 0  PHQ-9 Score 6 11 11 16   Difficult doing work/chores Not difficult at all Somewhat difficult Somewhat difficult Very difficult    GAD-7 Scores:  GAD 7 : Generalized Anxiety Score 11/22/18 09/10/2018 08/19/2018 07/18/2018  Nervous, Anxious, on Edge 1 1 2 3   Control/stop worrying 1 1 2 3   Worry too much - different things 1 1 2 2   Trouble relaxing 1 1 1 2   Restless 0 1 1 1   Easily annoyed or irritable 0 1 1 2   Afraid - awful might happen 1 1 1 3   Total GAD 7 Score 5 7 10 16   Anxiety Difficulty Not difficult at all Somewhat difficult Somewhat difficult -    Stress Current stressors: Current Stressors: Other (Comment)(Her ex-husband is in jail and still calling her.  School has started and her classes are challanging.) Sleep: Sleep: No problems Appetite: Appetite: No problems Coping ability: Coping ability: Resilient Patient taking medications as prescribed: Patient taking medications as prescribed: No    Current medications:  Outpatient Encounter Medications as of 22-Nov-2018  Medication Sig  . escitalopram (LEXAPRO) 10 MG tablet Take 1 tablet (10 mg total) by mouth daily.  . fluconazole (DIFLUCAN) 200 MG tablet Take one dose by mouth, wait 72 hours, and then take second dose by  mouth  . metroNIDAZOLE (FLAGYL) 500 MG tablet Take 1 tablet (500 mg total) by mouth 2 (two) times daily.  . sertraline (ZOLOFT) 100 MG tablet Take 25 mg by mouth daily.   . valACYclovir (VALTREX) 1000 MG tablet 2 po bid for 1 day   No facility-administered encounter medications on file as of 2018-11-22.      Self-harm Behaviors Risk Assessment Self-harm risk factors: Self-harm risk factors: (none reported) Patient endorses recent thoughts of harming self: Have you recently had any thoughts about harming yourself?: No  [place] Suicide Severity Rating Scale: No flowsheet data found. C-SRSS 07/18/2018 08/19/2018 09/10/2018 11-22-18  1. Wish to be Dead No No No No  2. Suicidal Thoughts No No No No  6. Suicide Behavior Question No No No No     Danger to Others Risk Assessment Danger to others risk factors: Danger to Others Risk Factors: No risk factors noted Patient endorses recent thoughts of harming others:      Substance Use Assessment Patient recently consumed alcohol:  None Reported  Alcohol Use Disorder Identification Test (AUDIT):  Alcohol Use Disorder Test (AUDIT) 07/18/2018  1. How often do you have a drink containing alcohol? 0  2. How many drinks containing alcohol do you have on a typical day when you are drinking? 0  3. How often do you have six  or more drinks on one occasion? 0  AUDIT-C Score 0  Alcohol Brief Interventions/Follow-up AUDIT Score <7 follow-up not indicated   Patient recently used drugs:  None Reported   Goals, Interventions and Follow-up Plan Goals: Increase healthy adjustment to current life circumstances Interventions: Behavioral Activation and Supportive Counseling Follow-up Plan: [facility] Phone Follow UP   Summary:   Patient is a 38 year old female.  Patient has a decrease in her PHQ and GAD score.    Patient reports that incorporating self-compassion has been helpful for her.  Patient reports that she has been putting herself first and allowing other  to kind to her.   Patient reports that she is still dating the same man but she is taking things very slow and he has been, "a kind gentleman to her".     Writer praised patient for prioritizing her own needs.  Writer challenged patient statement that she has to be helpful to her ex-husband that is in jail and continues to call and write her letter from jail.  Patient report that she is only being a friend to her ex-husband but he wants more and will often tell her that he wants to have his wife back when he gets out of jail.    Writer discussed the importance of being honest with her ex-husband that she does not have feeling for him in that manner.  Patient acknowledged that when he calls it breaks her concentration to study and it is, "emotionally draining to her".     Medication:  Meghan Carter denies any negative side effects with her psychiatric medication.  Meghan Carter has been compliant with her psychiatric medication.  Meghan Carter reports that she only takes her anxiety medication when her legs and hand start to shake and she is not able to calm herself down.  Writer reviewed deep breathing exercises when she feels anxious.  Meghan Carter reports that when she takes the medication daily she feels as though she is not herself.  Patient denies SI/HI/Psychosis when taking the medication daily.  Patient reports that she,  "just does not want to become dependent and she does not like the way it makes her feel when she takes it ever day".    During the next session:  Writer will email patient deep breathing and guided imagry exercise.  Patient will write a letter to her ex-husband explain that she wants him to strengthen his relationship with God.  Patient reports that she will tell her ex-husband that      Meghan Carter, Meghan Carter

## 2018-10-30 NOTE — Progress Notes (Signed)
According to the intake by [facility] specialist, the patient takes lexapro prn as it makes her "not herself." May consider switching to other antidepressant if the patient is unable to tolerate lexapro. [place] specialist to continue to coach self compassion.

## 2018-11-12 ENCOUNTER — Telehealth: Payer: Self-pay

## 2018-11-12 NOTE — Telephone Encounter (Signed)
[facility] - Left Message  

## 2018-11-13 ENCOUNTER — Telehealth: Payer: Self-pay | Admitting: Family Medicine

## 2018-11-13 NOTE — Telephone Encounter (Signed)
Chart updated

## 2018-11-13 NOTE — Telephone Encounter (Signed)
NO to the FLU SHOT

## 2018-11-19 ENCOUNTER — Ambulatory Visit (INDEPENDENT_AMBULATORY_CARE_PROVIDER_SITE_OTHER): Payer: PRIVATE HEALTH INSURANCE | Admitting: Family Medicine

## 2018-11-19 ENCOUNTER — Other Ambulatory Visit: Payer: Self-pay

## 2018-11-19 ENCOUNTER — Encounter: Payer: Self-pay | Admitting: Family Medicine

## 2018-11-19 VITALS — Ht 64.0 in | Wt 205.0 lb

## 2018-11-19 DIAGNOSIS — B009 Herpesviral infection, unspecified: Secondary | ICD-10-CM

## 2018-11-19 DIAGNOSIS — F419 Anxiety disorder, unspecified: Secondary | ICD-10-CM | POA: Diagnosis not present

## 2018-11-19 DIAGNOSIS — F321 Major depressive disorder, single episode, moderate: Secondary | ICD-10-CM | POA: Diagnosis not present

## 2018-11-19 MED ORDER — ESCITALOPRAM OXALATE 10 MG PO TABS: 10.0000 mg | ORAL_TABLET | Freq: Every day | ORAL | 3 refills | Status: DC

## 2018-11-19 MED ORDER — BUSPIRONE HCL 5 MG PO TABS: 5.0000 mg | ORAL_TABLET | Freq: Three times a day (TID) | ORAL | 1 refills | Status: DC | PRN

## 2018-11-19 NOTE — Progress Notes (Signed)
Virtual Visit via Telephone Note   This visit type was conducted due to national recommendations for restrictions regarding the COVID-19 Pandemic (e.g. social distancing) in an effort to limit this patient's exposure and mitigate transmission in our community.  Due to her co-morbid illnesses, this patient is at least at moderate risk for complications without adequate follow up.  This format is felt to be most appropriate for this patient at this time.  The patient did not have access to video technology/had technical difficulties with video requiring transitioning to audio format only (telephone).  All issues noted in this document were discussed and addressed.  No physical exam could be performed with this format.   Evaluation Performed:  Follow-up visit  Date:  11/19/2018   ID:  Meghan Carter, DOB [birth year], MRN [id number]  Patient Location: Home Provider Location: Office  Location of Patient: Home Location of Provider: Telehealth Consent was obtain for visit to be over via telehealth. I verified that I am speaking with the correct person using two identifiers.  PCP:  Perlie Mayo, NP   Chief Complaint:  Anxiety   History of Present Illness:    Meghan Carter is a 38 y.o. female with significant history of anxiety and depression.  Also reports possible increased recent breakout of herpes simplex infection.  With multiple blisters around the vaginal and mouth area.  She reports a recent treatment for herpes back in August.  Reports that she felt like her blister she discontinued.  She did not follow-up after that.  Today she reports that she has had increase in anxiety over the last couple weeks.  It appears that she has been taking Zoloft and Lexapro.  She reports she only takes Zoloft as needed, which is not how it was ordered.  She reports increasing headaches.  Not sleeping that well.  Very stressed out and overwhelmed.  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  Really concerned that she is not going to be on track.  She reports feeling anxious, down, depressed and hopeless at times.  The patient does not have symptoms concerning for COVID-19 infection (fever, chills, cough, or new shortness of breath).   Past Medical, Surgical, Social History, Allergies, and Medications have been Reviewed.  Past Medical History:  Diagnosis Date  . Anemia   . Anxiety   . Atherosclerosis of arteries   . Blood transfusion without reported diagnosis 2017   2 units, no reaction  . Depression   . Dysmenorrhea   . Endometrial polyp 02/11/2014  . Fecal occult blood test positive 01/27/2014  . Fibroid   . History of abnormal cervical Pap smear 02/11/2014  . HSV (herpes simplex virus) infection   . Hydrosalpinx    Bilateral  . Internal hemorrhoids   . Menorrhagia with regular cycle 01/27/2014  . Pelvic pain in female 01/27/2014  . Rectal bleeding 01/27/2014  . Vaginal Pap smear, abnormal    Past Surgical History:  Procedure Laterality Date  . CHROMOPERTUBATION  08/14/2017   Procedure: CHROMOPERTUBATION;  Surgeon: Governor Specking, MD;  Location: [facility];  Service: Gynecology;;  . COLONOSCOPY N/A 03/23/2014   SLF: 1. The examined terminal ileum appeared to be normal. 2. The left colon is redundant 3. Moderate sized internal hemorrhoids  . ECTOPIC PREGNANCY SURGERY Left 2005  . FLEXIBLE SIGMOIDOSCOPY N/A 04/01/2015   SLF: rectal bleeding due to large internal hemorrhoids 2. anemia due to rectal bleeding/memses/  poor iron intake.  Marland Kitchen FLEXIBLE SIGMOIDOSCOPY N/A 03/27/2015   Procedure: FLEXIBLE SIGMOIDOSCOPY;  Surgeon: Danie Binder, MD;  [facility];  Service: Endoscopy;  Laterality: N/A;  . HEMORRHOID BANDING N/A 03/23/2014   Procedure: HEMORRHOID BANDING;  Surgeon: Danie Binder, MD;  [facility];  Service: Endoscopy;  Laterality: N/A;  . HEMORRHOID BANDING  03/27/2015   Procedure: HEMORRHOID BANDING;  Surgeon: Danie Binder, MD;  [facility];  Service: Endoscopy;;  . HEMORRHOID SURGERY N/A 01/11/2018   Procedure: EXTENSIVE HEMORRHOIDECTOMY;  Surgeon: Aviva Signs, MD;  Location: [facility];  Service: General;  Laterality: N/A;  . HYSTEROSCOPY W/D&C N/A 06/16/2014   Procedure: DILATATION AND CURETTAGE /HYSTEROSCOPY;  Surgeon: Jonnie Kind, MD;  Location: [facility];  Service: Gynecology;  Laterality: N/A;  . LAPAROSCOPIC UNILATERAL SALPINGECTOMY Right 06/16/2014   Procedure: LAPAROSCOPIC UNILATERAL SALPINGECTOMY;  Surgeon: Jonnie Kind, MD;  [facility]: [facility];  Service: Gynecology;  Laterality: Right;  . LAPAROSCOPY N/A 08/14/2017   Procedure: OPERATIVE LAPAROSCOPY ,LEFT SALPINGECTOMY, LYSIS OF ADHESIONS;  Surgeon: Governor Specking, MD;  [facility]: [facility];  Service: Gynecology;  Laterality: N/A;  . POLYPECTOMY N/A 06/16/2014   Procedure: POLYPECTOMY (endometrial);  Surgeon: Jonnie Kind, MD;  [facility]: [facility];  Service: Gynecology;  Laterality: N/A;     Current Meds  Medication Sig  . escitalopram (LEXAPRO) 10 MG tablet Take 1 tablet (10 mg total) by mouth daily.  . [DISCONTINUED] escitalopram (LEXAPRO) 10 MG tablet Take 1 tablet (10 mg total) by mouth daily.  . [DISCONTINUED] sertraline (ZOLOFT) 100 MG tablet Take 25 mg by mouth daily.      Allergies:   Doxycycline hyclate   Social History   Tobacco Use  . Smoking status: Light Tobacco Smoker    Packs/day: 0.25    Years: 27.00    Pack years: 6.75    Types: Cigarettes  . Smokeless tobacco: Never Used  Substance Use Topics  . Alcohol use: Yes    Comment: "very little"  . Drug use: No     Family Hx: The patient's family history includes Diabetes in her father, paternal grandfather, and paternal grandmother.  ROS:   Please see the history of present illness.    All other systems reviewed and are negative.   Labs/Other Tests and Data Reviewed:    Recent Labs: 12/31/2017: BUN 12; Creatinine, Ser 0.74; Hemoglobin 9.8; Platelets  294; Potassium 3.8; Sodium 136   Recent Lipid Panel No results found for: CHOL, TRIG, HDL, CHOLHDL, LDLCALC, LDLDIRECT  Wt Readings from Last 3 Encounters:  11/19/18 205 lb (93 kg)  08/27/18 201 lb (91.2 kg)  07/18/18 202 lb (91.6 kg)     Objective:    Vital Signs:  Ht 5\' 4"  (1.626 m)   Wt 205 lb (93 kg)   BMI 35.19 kg/m    VITAL SIGNS:  reviewed GEN:  alert and oriented RESPIRATORY:  no shortness of breath in conversation PSYCH:  anxious and depressed in conversation, tearful  Depression screen [facility] 2/9 11/19/2018 10/29/2018 09/10/2018  Decreased Interest 0 1 2  Down, Depressed, Hopeless 3 1 2   PHQ - 2 Score 3 2 4   Altered sleeping 1 0 1  Tired, decreased energy 1 1 1   Change in appetite 1 0 0  Feeling bad or failure about yourself  1 2 2   Trouble concentrating 3 1 2   Moving slowly or fidgety/restless 1 0 1  Suicidal thoughts 0 0 0  PHQ-9 Score  11 6 11   Difficult doing work/chores Somewhat difficult Not difficult at all Somewhat difficult     GAD 7 : Generalized Anxiety Score 11/19/2018 10/29/2018 09/10/2018 08/19/2018  Nervous, Anxious, on Edge 2 1 1 2   Control/stop worrying 2 1 1 2   Worry too much - different things 2 1 1 2   Trouble relaxing 1 1 1 1   Restless 1 0 1 1  Easily annoyed or irritable 2 0 1 1  Afraid - awful might happen 2 1 1 1   Total GAD 7 Score 12 5 7 10   Anxiety Difficulty Somewhat difficult Not difficult at all Somewhat difficult Somewhat difficult       ASSESSMENT & PLAN:     1. Depression, major, single episode, moderate ([place]) She is encouraged to make sure that she maintains her therapy with Ava.  She is advised to stop her Zoloft as she is not taking this in the correct fashion currently at this time.  She is advised to continue her Lexapro we will increase her Lexapro to 20 mg on a trial basis keeping her on 10 mg that she will take 2 a day the next 2 weeks to see if that is helpful for her.  If it is we will increase her dose fully to 20 mg a  day.  If it is not we will maintain at the 10 mg or adjust medications and go with something different altogether.  Also have provided her with BuSpar to see if that would help with some of her anxiety and short-term management.  Additionally provided with several things that she could implement like to help make sure that she is feeling more balanced and not as depressed or anxious.  Reviewed side effects, risks and benefits of medication.   Patient acknowledged agreement and understanding of the plan.    - escitalopram (LEXAPRO) 10 MG tablet; Take 1 tablet (10 mg total) by mouth daily.  Dispense: 30 tablet; Refill: 3  2. Anxiety Secondary to her increase in anxiety we will start her on BuSpar.  Stop the Zoloft as it did not realize that she was taking this when we first talked and I placed her on Lexapro.  Advised that these are not good to have combination of medications.  They are in the same family should only take 1.  She is aware of this and is not against stopping the Zoloft at this time.  And continuing the Lexapro.  Additionally advised that she needs to take Lexapro daily this is not an as needed medication.  And started on BuSpar which is more of an as needed medication and provided extensive education with this. Additionally provided with several things that she could implement like to help make sure that she is feeling more balanced and not as depressed or anxious.  Reviewed side effects, risks and benefits of medication.   Patient acknowledged agreement and understanding of the plan.    - escitalopram (LEXAPRO) 10 MG tablet; Take 1 tablet (10 mg total) by mouth daily.  Dispense: 30 tablet; Refill: 3 - busPIRone (BUSPAR) 5 MG tablet; Take 1 tablet (5 mg total) by mouth 3 (three) times daily as needed.  Dispense: 90 tablet; Refill: 1  3. Herpes simplex type 2 infection Reports an increase in blisters.  Reports that she took her medicine back in August but does not feel like it did  much for her.  Reports that she just keeps having more blisters breakout specially recently.  Does report that  she has had a lot of stress she was educated that this can cause more the breakouts and blistering.  She would like to see the GYN again and make sure that what she is experiencing is the herpes virus.  - Ambulatory referral to Obstetrics / Gynecology   Time:   Today, I have spent 15 minutes with the patient with telehealth technology discussing the above problems.     Medication Adjustments/Labs and Tests Ordered: Current medicines are reviewed at length with the patient today.  Concerns regarding medicines are outlined above.   Tests Ordered: Orders Placed This Encounter  Procedures  . Ambulatory referral to Obstetrics / Gynecology    Medication Changes: Meds ordered this encounter  Medications  . escitalopram (LEXAPRO) 10 MG tablet    Sig: Take 1 tablet (10 mg total) by mouth daily.    Dispense:  30 tablet    Refill:  3    Order Specific Question:   Supervising Provider    Answer:   SIMPSON, MARGARET E [id number]  . busPIRone (BUSPAR) 5 MG tablet    Sig: Take 1 tablet (5 mg total) by mouth 3 (three) times daily as needed.    Dispense:  90 tablet    Refill:  1    Order Specific Question:   Supervising Provider    Answer:   Fayrene Helper [id number]    Disposition:  Follow up 2 weeks phone visit  Signed, Perlie Mayo, NP  11/19/2018 11:06 AM     [place]

## 2018-11-19 NOTE — Patient Instructions (Addendum)
    I appreciate the opportunity to provide you with the care for your health and wellness. Today we discussed: Anxiety, depression, herpes simplex outbreak  Follow up: 2 weeks by phone if doing better can send a message via my chart  No labs  GYN referral was sent today to make sure that you are going to be treated for the appropriate diagnosis with the blisters and the descriptions of what you have detailed over the phone.  They can see you in person and assess this better.  Please stop taking Zoloft.  Please start taking 20 mg of Lexapro.  You have 10 mg tablets currently you can take 2 of those at one time in the morning.  For the next 2 weeks.  If you are feeling better on the 20 mg of Lexapro we will send you in a 20 mg prescription for that.  Please continue to follow-up with [facility]va as you can.    Please make sure that you are mindful and patient with yourself when things do not go the way that you would like implant or if you have increased stress.  Make sure that you are getting at least 7 to 9 hours asleep if you can.  Make sure that you are staying hydrated eating fruits and vegetables to keep your immune system strong.  Make sure that you are taking time for self-care which includes making sure that you do stretching, walking, yoga or meditation.  Make sure that you are spending time doing things that you enjoy just as much is doing things that you know that you need to get done. Just a reminder:    Please continue to practice social distancing to keep you, your family, and our community safe.  If you must go out, please wear a mask and practice good handwashing.  It was a pleasure to see you and I look forward to continuing to work together on your health and well-being. Please do not hesitate to call the office if you need care or have questions about your care.  Have a wonderful day and week. With Gratitude, Cherly Beach, DNP, AGNP-BC

## 2018-11-26 ENCOUNTER — Telehealth: Payer: Self-pay

## 2018-11-26 NOTE — Telephone Encounter (Signed)
[facility] - Left Message  

## 2018-12-03 ENCOUNTER — Ambulatory Visit (INDEPENDENT_AMBULATORY_CARE_PROVIDER_SITE_OTHER): Payer: PRIVATE HEALTH INSURANCE | Admitting: Family Medicine

## 2018-12-03 ENCOUNTER — Encounter: Payer: Self-pay | Admitting: Family Medicine

## 2018-12-03 ENCOUNTER — Other Ambulatory Visit: Payer: Self-pay

## 2018-12-03 DIAGNOSIS — F419 Anxiety disorder, unspecified: Secondary | ICD-10-CM

## 2018-12-03 DIAGNOSIS — F3341 Major depressive disorder, recurrent, in partial remission: Secondary | ICD-10-CM

## 2018-12-03 NOTE — Progress Notes (Signed)
Virtual Visit via Telephone Note   This visit type was conducted due to national recommendations for restrictions regarding the COVID-19 Pandemic (e.g. social distancing) in an effort to limit this patient's exposure and mitigate transmission in our community.  Due to her co-morbid illnesses, this patient is at least at moderate risk for complications without adequate follow up.  This format is felt to be most appropriate for this patient at this time.  The patient did not have access to video technology/had technical difficulties with video requiring transitioning to audio format only (telephone).  All issues noted in this document were discussed and addressed.  No physical exam could be performed with this format.    Evaluation Performed:  Follow-up visit  Date:  12/03/2018   ID:  Meghan Carter, DOB March 17, 1980, MRN [id number]  Patient Location: Home Provider Location: Office  Location of Patient: Home Location of Provider: Telehealth Consent was obtain for visit to be over via telehealth. I verified that I am speaking with the correct person using two identifiers.  PCP:  Perlie Mayo, NP   Chief Complaint:  Anxiety and depression  History of Present Illness:    Meghan Carter is a 38 y.o. female with history of anxiety and depression.  Reports that she is doing much better now that she is taking Lexapro on a regular basis.  She was taking Zoloft as needed I had to instruct her at her last visit on October 13 that is not how supposed be taking.  She shall be taking 1 SSRI.  Still reports that she is not sleeping as well.  Denies having any increase in headaches or changes in headaches.  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  She reports that her overall feelings of anxiety, feeling down and depressed have improved tremendously.   Especially since talking to her school.  She reports that she has had some contact with [facility] she did call the office but did leave a message.  The patient does not have symptoms concerning for COVID-19 infection (fever, chills, cough, or new shortness of breath).   Past Medical, Surgical, Social History, Allergies, and Medications have been Reviewed.  Past Medical History:  Diagnosis Date  . Anemia   . Anxiety   . Atherosclerosis of arteries   . Blood transfusion without reported diagnosis 2017   2 units, no reaction  . Depression   . Dysmenorrhea   . Endometrial polyp 02/11/2014  . Fecal occult blood test positive 01/27/2014  . Fibroid   . History of abnormal cervical Pap smear 02/11/2014  . HSV (herpes simplex virus) infection   . Hydrosalpinx    Bilateral  . Internal hemorrhoids   . Menorrhagia with regular cycle 01/27/2014  . Pelvic pain in female 01/27/2014  . Rectal bleeding 01/27/2014  . Vaginal Pap smear, abnormal    Past Surgical History:  Procedure Laterality Date  . CHROMOPERTUBATION  08/14/2017   Procedure: CHROMOPERTUBATION;  Surgeon: Governor Specking, MD;  Location: [facility];  Service: Gynecology;;  . COLONOSCOPY N/A 03/23/2014   SLF: 1. The examined terminal ileum appeared to be normal. 2. The left colon is redundant 3. Moderate sized internal hemorrhoids  . ECTOPIC PREGNANCY SURGERY Left 2005  . FLEXIBLE SIGMOIDOSCOPY N/A 04/01/2015   SLF: rectal bleeding due to large  internal hemorrhoids 2. anemia due to rectal bleeding/memses/ poor iron intake.  Marland Kitchen FLEXIBLE SIGMOIDOSCOPY N/A 03/27/2015   Procedure: FLEXIBLE SIGMOIDOSCOPY;  Surgeon: Danie Binder, MD;  [facility];  Service: Endoscopy;  Laterality: N/A;  . HEMORRHOID BANDING N/A 03/23/2014   Procedure: HEMORRHOID BANDING;  Surgeon: Danie Binder, MD;  [facility];  Service: Endoscopy;  Laterality: N/A;  . HEMORRHOID BANDING  03/27/2015   Procedure: HEMORRHOID BANDING;  Surgeon: Danie Binder,  MD;  [facility];  Service: Endoscopy;;  . HEMORRHOID SURGERY N/A 01/11/2018   Procedure: EXTENSIVE HEMORRHOIDECTOMY;  Surgeon: Aviva Signs, MD;  Location[facility];  Service: General;  Laterality: N/A;  . HYSTEROSCOPY W/D&C N/A 06/16/2014   Procedure: DILATATION AND CURETTAGE /HYSTEROSCOPY;  Surgeon: Jonnie Kind, MD;  [facility];  Service: Gynecology;  Laterality: N/A;  . LAPAROSCOPIC UNILATERAL SALPINGECTOMY Right 06/16/2014   Procedure: LAPAROSCOPIC UNILATERAL SALPINGECTOMY;  Surgeon: Jonnie Kind, MD;  [facility];  Service: Gynecology;  Laterality: Right;  . LAPAROSCOPY N/A 08/14/2017   Procedure: OPERATIVE LAPAROSCOPY ,LEFT SALPINGECTOMY, LYSIS OF ADHESIONS;  Surgeon: Governor Specking, MD;  [facility];  Service: Gynecology;  Laterality: N/A;  . POLYPECTOMY N/A 06/16/2014   Procedure: POLYPECTOMY (endometrial);  Surgeon: Jonnie Kind, MD;  [facility];  Service: Gynecology;  Laterality: N/A;     Current Meds  Medication Sig  . busPIRone (BUSPAR) 5 MG tablet Take 1 tablet (5 mg total) by mouth 3 (three) times daily as needed.  Marland Kitchen escitalopram (LEXAPRO) 10 MG tablet Take 1 tablet (10 mg total) by mouth daily.     Allergies:   Doxycycline hyclate   Social History   Tobacco Use  . Smoking status: Light Tobacco Smoker    Packs/day: 0.25    Years: 27.00    Pack years: 6.75    Types: Cigarettes  . Smokeless tobacco: Never Used  Substance Use Topics  . Alcohol use: Yes    Comment: "very little"  . Drug use: No     Family Hx: The patient's family history includes Diabetes in her father, paternal grandfather, and paternal grandmother.  ROS:   Please see the history of present illness.    All other systems reviewed and are negative.   Labs/Other Tests and Data Reviewed:     Recent Labs: 12/31/2017: BUN 12; Creatinine, Ser 0.74; Hemoglobin 9.8; Platelets 294; Potassium 3.8; Sodium 136   Recent Lipid Panel No results found for: CHOL,  TRIG, HDL, CHOLHDL, LDLCALC, LDLDIRECT  Wt Readings from Last 3 Encounters:  11/19/18 205 lb (93 kg)  08/27/18 201 lb (91.2 kg)  07/18/18 202 lb (91.6 kg)     Objective:    Vital Signs:  There were no vitals taken for this visit.   GEN:  Alert and oriented RESPIRATORY:  No shortness of breath during conversation PSYCH:  Affect and mood good communication pleasant not tearful where she was  GAD 7 : Generalized Anxiety Score 12/03/2018 11/19/2018 10/29/2018 09/10/2018  Nervous, Anxious, on Edge 1 2 1 1   Control/stop worrying 0 2 1 1   Worry too much - different things 0 2 1 1   Trouble relaxing 1 1 1 1   Restless 0 1 0 1  Easily annoyed or irritable 1 2 0 1  Afraid - awful might happen 1 2 1 1   Total GAD 7 Score 4 12 5 7   Anxiety Difficulty Somewhat difficult Somewhat difficult Not difficult at all Somewhat difficult  Depression screen [facility] 2/9 12/03/2018 11/19/2018 10/29/2018  Decreased Interest 1 0 1  Down, Depressed, Hopeless 2 3 1   PHQ - 2 Score 3 3 2   Altered sleeping 2 1 0  Tired, decreased energy 0 1 1  Change in appetite 0 1 0  Feeling bad or failure about yourself  0 1 2  Trouble concentrating 1 3 1   Moving slowly or fidgety/restless 0 1 0  Suicidal thoughts 0 0 0  PHQ-9 Score 6 11 6   Difficult doing work/chores Somewhat difficult Somewhat difficult Not difficult at all      ASSESSMENT & PLAN:    1. Recurrent major depressive disorder, in partial remission (HCC) Partial remission of depression.  Reports that she is doing so much better on the Lexapro.  Reports that she is also a lot happier now that she stopped her school about situation and trying to get things process.  She reports that she has had some phone tag with [facility] with therapy.  Overall her scores on her GAD and her PHQ are improved.  We will follow-up with her in 3 months continue Lexapro as ordered.   2. Anxiety Improved, not fully resolved but she says that she is handling things better.  Reports  Lexapro has helped her a lot.  And doing much better now that she has been more consistent in taking it.  Continue taking Lexapro and BuSpar as needed.  Additionally advised for her to contact Ava or myself if she needs [place].  As talking can help with mental wellness    Time:   Today, I have spent 10 minutes with the patient with telehealth technology discussing the above problems.     Medication Adjustments/Labs and Tests Ordered: Current medicines are reviewed at length with the patient today.  Concerns regarding medicines are outlined above.   Tests Ordered: No orders of the defined types were placed in this encounter.   Medication Changes: No orders of the defined types were placed in this encounter.   Disposition:  Follow up 3 month  Signed, Perlie Mayo, NP  12/03/2018 11:28 AM     [facility]

## 2018-12-03 NOTE — Patient Instructions (Signed)
  I appreciate the opportunity to provide you with the care for your health and wellness. Today we discussed: Anxiety, depression  Follow up: 3 months via my chart or in person  No labs  So glad that you are feeling much better!! :)  So glad that you are able to reach out to school and get some stuff situated. Continue taking your Lexapro as ordered.  Use BuSpar as needed.    Please continue to follow-up with [facility]va as you can.    Please make sure that you are mindful and patient with yourself when things do not go the way that you would like implant or if you have increased stress.  Make sure that you are getting at least 7 to 9 hours asleep if you can.  Make sure that you are staying hydrated eating fruits and vegetables to keep your immune system strong.  Make sure that you are taking time for self-care which includes making sure that you do stretching, walking, yoga or meditation.  Make sure that you are spending time doing things that you enjoy just as much is doing things that you know that you need to get done.  It was a pleasure to see you and I look forward to continuing to work together on your health and well-being. Please do not hesitate to call the office if you need care or have questions about your care.  Have a wonderful day and week. With Gratitude, Cherly Beach, DNP, AGNP-BC

## 2018-12-06 ENCOUNTER — Ambulatory Visit (INDEPENDENT_AMBULATORY_CARE_PROVIDER_SITE_OTHER): Payer: PRIVATE HEALTH INSURANCE | Admitting: Obstetrics & Gynecology

## 2018-12-06 ENCOUNTER — Other Ambulatory Visit: Payer: Self-pay

## 2018-12-06 ENCOUNTER — Encounter: Payer: Self-pay | Admitting: Obstetrics & Gynecology

## 2018-12-06 VITALS — BP 105/70 | HR 99 | Ht 64.0 in | Wt 208.0 lb

## 2018-12-06 DIAGNOSIS — L739 Follicular disorder, unspecified: Secondary | ICD-10-CM | POA: Diagnosis not present

## 2018-12-06 DIAGNOSIS — N946 Dysmenorrhea, unspecified: Secondary | ICD-10-CM

## 2018-12-06 DIAGNOSIS — B009 Herpesviral infection, unspecified: Secondary | ICD-10-CM

## 2018-12-06 MED ORDER — CLINDAMYCIN HCL 300 MG PO CAPS: 300.0000 mg | ORAL_CAPSULE | Freq: Three times a day (TID) | ORAL | 0 refills | Status: DC

## 2018-12-06 MED ORDER — VALACYCLOVIR HCL 1 G PO TABS: 1000.0000 mg | ORAL_TABLET | Freq: Every day | ORAL | 11 refills | Status: DC

## 2018-12-06 MED ORDER — SILVER SULFADIAZINE 1 % EX CREA: TOPICAL_CREAM | CUTANEOUS | 11 refills | Status: DC

## 2018-12-06 MED ORDER — DESOGESTREL-ETHINYL ESTRADIOL 0.15-30 MG-MCG PO TABS: 1.0000 | ORAL_TABLET | Freq: Every day | ORAL | 11 refills | Status: DC

## 2018-12-06 NOTE — Progress Notes (Signed)
Chief Complaint  Patient presents with  . Gynecologic Exam    has herpes outbreak, and having pain.       38 y.o. G1P0010 Patient's last menstrual period was 12/03/2018. The current method of family planning is salpingectomies.  Outpatient Encounter Medications as of 12/06/2018  Medication Sig  . busPIRone (BUSPAR) 5 MG tablet Take 1 tablet (5 mg total) by mouth 3 (three) times daily as needed.  Marland Kitchen escitalopram (LEXAPRO) 10 MG tablet Take 1 tablet (10 mg total) by mouth daily.  . clindamycin (CLEOCIN) 300 MG capsule Take 1 capsule (300 mg total) by mouth 3 (three) times daily.  Marland Kitchen desogestrel-ethinyl estradiol (APRI) 0.15-30 MG-MCG tablet Take 1 tablet by mouth daily.  . silver sulfADIAZINE (SILVADENE) 1 % cream Use to area 3 times per day  . valACYclovir (VALTREX) 1000 MG tablet Take 1 tablet (1,000 mg total) by mouth daily.   No facility-administered encounter medications on file as of 12/06/2018.     Subjective Pt with areas on vulva recurretn  Present currently Also co cystic acne Dysmenorrhea Ulcers occasionally for the last several months Past Medical History:  Diagnosis Date  . Anemia   . Anxiety   . Atherosclerosis of arteries   . Blood transfusion without reported diagnosis 2017   2 units, no reaction  . Depression   . Dysmenorrhea   . Endometrial polyp 02/11/2014  . Fecal occult blood test positive 01/27/2014  . Fibroid   . History of abnormal cervical Pap smear 02/11/2014  . HSV (herpes simplex virus) infection   . Hydrosalpinx    Bilateral  . Internal hemorrhoids   . Menorrhagia with regular cycle 01/27/2014  . Pelvic pain in female 01/27/2014  . Rectal bleeding 01/27/2014  . Vaginal Pap smear, abnormal     Past Surgical History:  Procedure Laterality Date  . CHROMOPERTUBATION  08/14/2017   Procedure: CHROMOPERTUBATION;  Surgeon: Governor Specking, MD;  Location: [facility];  Service: Gynecology;;  . COLONOSCOPY N/A 03/23/2014   SLF: 1. The examined  terminal ileum appeared to be normal. 2. The left colon is redundant 3. Moderate sized internal hemorrhoids  . ECTOPIC PREGNANCY SURGERY Left 2005  . FLEXIBLE SIGMOIDOSCOPY N/A 04/01/2015   SLF: rectal bleeding due to large internal hemorrhoids 2. anemia due to rectal bleeding/memses/ poor iron intake.  Marland Kitchen FLEXIBLE SIGMOIDOSCOPY N/A 03/27/2015   Procedure: FLEXIBLE SIGMOIDOSCOPY;  Surgeon: Danie Binder, MD;  Location: [facility];  Service: Endoscopy;  Laterality: N/A;  . HEMORRHOID BANDING N/A 03/23/2014   Procedure: HEMORRHOID BANDING;  Surgeon: Danie Binder, MD;  Location: [facility];  Service: Endoscopy;  Laterality: N/A;  . HEMORRHOID BANDING  03/27/2015   Procedure: HEMORRHOID BANDING;  Surgeon: Danie Binder, MD;  Location: [facility];  Service: Endoscopy;;  . HEMORRHOID SURGERY N/A 01/11/2018   Procedure: EXTENSIVE HEMORRHOIDECTOMY;  Surgeon: Aviva Signs, MD;  Location: [facility];  Service: General;  Laterality: N/A;  . HYSTEROSCOPY W/D&C N/A 06/16/2014   Procedure: DILATATION AND CURETTAGE /HYSTEROSCOPY;  Surgeon: Jonnie Kind, MD;  Location: [facility];  Service: Gynecology;  Laterality: N/A;  . LAPAROSCOPIC UNILATERAL SALPINGECTOMY Right 06/16/2014   Procedure: LAPAROSCOPIC UNILATERAL SALPINGECTOMY;  Surgeon: Jonnie Kind, MD;  Location: [facility];  Service: Gynecology;  Laterality: Right;  . LAPAROSCOPY N/A 08/14/2017   Procedure: OPERATIVE LAPAROSCOPY ,LEFT SALPINGECTOMY, LYSIS OF ADHESIONS;  Surgeon: Governor Specking, MD;  Location: [facility];  Service: Gynecology;  Laterality: N/A;  . POLYPECTOMY N/A 06/16/2014  Procedure: POLYPECTOMY (endometrial);  Surgeon: Jonnie Kind, MD;  Location: [facility];  Service: Gynecology;  Laterality: N/A;    OB History    Gravida  1   Para      Term      Preterm      AB  1   Living        SAB      TAB      Ectopic  1   Multiple      Live Births              Allergies  Allergen Reactions  . Doxycycline Hyclate Rash     Social History   Socioeconomic History  . Marital status: Legally Separated    Spouse name: Not on file  . Number of children: Not on file  . Years of education: Not on file  . Highest education level: Not on file  Occupational History  . Not on file  Social Needs  . Financial resource strain: Not on file  . Food insecurity    Worry: Not on file    Inability: Not on file  . Transportation needs    Medical: Not on file    Non-medical: Not on file  Tobacco Use  . Smoking status: Light Tobacco Smoker    Packs/day: 0.25    Years: 27.00    Pack years: 6.75    Types: Cigarettes  . Smokeless tobacco: Never Used  Substance and Sexual Activity  . Alcohol use: Yes    Comment: "very little"  . Drug use: No  . Sexual activity: Yes    Partners: Male    Birth control/protection: None  Lifestyle  . Physical activity    Days per week: Not on file    Minutes per session: Not on file  . Stress: Not on file  Relationships  . Social Herbalist on phone: Not on file    Gets together: Not on file    Attends religious service: Not on file    Active member of club or organization: Not on file    Attends meetings of clubs or organizations: Not on file    Relationship status: Not on file  Other Topics Concern  . Not on file  Social History Narrative  . Not on file    Family History  Problem Relation Age of Onset  . Diabetes Father   . Diabetes Paternal Grandmother   . Diabetes Paternal Grandfather     Medications:       Current Outpatient Medications:  .  busPIRone (BUSPAR) 5 MG tablet, Take 1 tablet (5 mg total) by mouth 3 (three) times daily as needed., Disp: 90 tablet, Rfl: 1 .  escitalopram (LEXAPRO) 10 MG tablet, Take 1 tablet (10 mg total) by mouth daily., Disp: 30 tablet, Rfl: 3 .  clindamycin (CLEOCIN) 300 MG capsule, Take 1 capsule (300 mg total) by mouth 3 (three) times daily., Disp: 30 capsule, Rfl: 0 .  desogestrel-ethinyl estradiol (APRI) 0.15-30 MG-MCG  tablet, Take 1 tablet by mouth daily., Disp: 1 Package, Rfl: 11 .  silver sulfADIAZINE (SILVADENE) 1 % cream, Use to area 3 times per day, Disp: 50 g, Rfl: 11 .  valACYclovir (VALTREX) 1000 MG tablet, Take 1 tablet (1,000 mg total) by mouth daily., Disp: 30 tablet, Rfl: 11  Objective Blood pressure 105/70, pulse 99, height 5\' 4"  (1.626 m), weight 208 lb (94.3 kg), last menstrual period 12/03/2018.  No herpetic lesions are seen  2 areas of folliculitis are present   Pertinent ROS No burning with urination, frequency or urgency No nausea, vomiting or diarrhea Nor fever chills or other constitutional symptoms   Labs or studies Non new    Impression Diagnoses this Encounter::   ICD-10-CM   1. Folliculitis  123XX123    Cleocin x 10 days, silvadene chronically  2. Herpes simplex type 2 infection  B00.9    Valtrex therapy  3. Dysmenorrhea  N94.6    OCP    Established relevant diagnosis(es):   Plan/Recommendations: Meds ordered this encounter  Medications  . valACYclovir (VALTREX) 1000 MG tablet    Sig: Take 1 tablet (1,000 mg total) by mouth daily.    Dispense:  30 tablet    Refill:  11  . silver sulfADIAZINE (SILVADENE) 1 % cream    Sig: Use to area 3 times per day    Dispense:  50 g    Refill:  11  . clindamycin (CLEOCIN) 300 MG capsule    Sig: Take 1 capsule (300 mg total) by mouth 3 (three) times daily.    Dispense:  30 capsule    Refill:  0  . desogestrel-ethinyl estradiol (APRI) 0.15-30 MG-MCG tablet    Sig: Take 1 tablet by mouth daily.    Dispense:  1 Package    Refill:  11    Labs or Scans Ordered: No orders of the defined types were placed in this encounter.   Management::   Follow up No follow-ups on file.       All questions were answered.

## 2018-12-18 ENCOUNTER — Telehealth: Payer: Self-pay | Admitting: Licensed Clinical Social Worker

## 2018-12-18 NOTE — Telephone Encounter (Signed)
Left message encouraging contact 

## 2018-12-25 ENCOUNTER — Encounter (HOSPITAL_COMMUNITY): Payer: Self-pay | Admitting: Emergency Medicine

## 2018-12-25 ENCOUNTER — Other Ambulatory Visit: Payer: Self-pay

## 2018-12-25 ENCOUNTER — Emergency Department (HOSPITAL_COMMUNITY)
Admission: EM | Admit: 2018-12-25 | Discharge: 2018-12-25 | Disposition: A | Discharge status: Home / Self Care | Payer: PRIVATE HEALTH INSURANCE | Attending: Emergency Medicine | Admitting: Emergency Medicine

## 2018-12-25 DIAGNOSIS — T50905A Adverse effect of unspecified drugs, medicaments and biological substances, initial encounter: Secondary | ICD-10-CM | POA: Diagnosis not present

## 2018-12-25 DIAGNOSIS — T7840XA Allergy, unspecified, initial encounter: Secondary | ICD-10-CM

## 2018-12-25 DIAGNOSIS — R21 Rash and other nonspecific skin eruption: Secondary | ICD-10-CM | POA: Insufficient documentation

## 2018-12-25 DIAGNOSIS — Z79899 Other long term (current) drug therapy: Secondary | ICD-10-CM | POA: Diagnosis not present

## 2018-12-25 DIAGNOSIS — L298 Other pruritus: Secondary | ICD-10-CM | POA: Insufficient documentation

## 2018-12-25 DIAGNOSIS — R109 Unspecified abdominal pain: Secondary | ICD-10-CM | POA: Diagnosis present

## 2018-12-25 DIAGNOSIS — F17210 Nicotine dependence, cigarettes, uncomplicated: Secondary | ICD-10-CM | POA: Diagnosis not present

## 2018-12-25 LAB — URINALYSIS, ROUTINE W REFLEX MICROSCOPIC
Bilirubin Urine: NEGATIVE
Glucose, UA: NEGATIVE mg/dL
Ketones, ur: NEGATIVE mg/dL
Leukocytes,Ua: NEGATIVE
Nitrite: NEGATIVE
Protein, ur: NEGATIVE mg/dL
Specific Gravity, Urine: 1.021 (ref 1.005–1.030)
pH: 5 (ref 5.0–8.0)

## 2018-12-25 LAB — CBC
HCT: 43.3 % (ref 36.0–46.0)
Hemoglobin: 13.5 g/dL (ref 12.0–15.0)
MCH: 27.7 pg (ref 26.0–34.0)
MCHC: 31.2 g/dL (ref 30.0–36.0)
MCV: 88.7 fL (ref 80.0–100.0)
Platelets: 302 10*3/uL (ref 150–400)
RBC: 4.88 MIL/uL (ref 3.87–5.11)
RDW: 13.2 % (ref 11.5–15.5)
WBC: 5.4 10*3/uL (ref 4.0–10.5)
nRBC: 0 % (ref 0.0–0.2)

## 2018-12-25 LAB — COMPREHENSIVE METABOLIC PANEL
ALT: 12 U/L (ref 0–44)
AST: 17 U/L (ref 15–41)
Albumin: 3.9 g/dL (ref 3.5–5.0)
Alkaline Phosphatase: 61 U/L (ref 38–126)
Anion gap: 8 (ref 5–15)
BUN: 9 mg/dL (ref 6–20)
CO2: 24 mmol/L (ref 22–32)
Calcium: 8.9 mg/dL (ref 8.9–10.3)
Chloride: 106 mmol/L (ref 98–111)
Creatinine, Ser: 0.77 mg/dL (ref 0.44–1.00)
GFR calc Af Amer: >60 mL/min (ref >60)
GFR calc non Af Amer: >60 mL/min (ref >60)
Glucose, Bld: 105 mg/dL — ABNORMAL HIGH (ref 70–99)
Potassium: 4 mmol/L (ref 3.5–5.1)
Sodium: 138 mmol/L (ref 135–145)
Total Bilirubin: 0.6 mg/dL (ref 0.3–1.2)
Total Protein: 7.4 g/dL (ref 6.5–8.1)

## 2018-12-25 LAB — LIPASE, BLOOD: Lipase: 82 U/L — ABNORMAL HIGH (ref 11–51)

## 2018-12-25 LAB — POC URINE PREG, ED: Preg Test, Ur: NEGATIVE

## 2018-12-25 MED ORDER — HYDROXYZINE HCL 25 MG PO TABS
25.0000 mg | ORAL_TABLET | Freq: Once | ORAL | Status: AC
  Administered 2018-12-25: 25 mg via ORAL
  Filled 2018-12-25: qty 1

## 2018-12-25 MED ORDER — LORAZEPAM 2 MG/ML IJ SOLN
1.0000 mg | Freq: Once | INTRAMUSCULAR | Status: AC
  Administered 2018-12-25: 1 mg via INTRAVENOUS
  Filled 2018-12-25: qty 1

## 2018-12-25 MED ORDER — DIPHENHYDRAMINE HCL 50 MG/ML IJ SOLN
12.5000 mg | Freq: Once | INTRAMUSCULAR | Status: AC
  Administered 2018-12-25: 12.5 mg via INTRAVENOUS
  Filled 2018-12-25: qty 1

## 2018-12-25 MED ORDER — PREDNISONE 10 MG PO TABS: ORAL_TABLET | ORAL | 0 refills | Status: DC

## 2018-12-25 MED ORDER — FAMOTIDINE IN NACL 20-0.9 MG/50ML-% IV SOLN
20.0000 mg | Freq: Once | INTRAVENOUS | Status: AC
  Administered 2018-12-25: 20 mg via INTRAVENOUS
  Filled 2018-12-25: qty 50

## 2018-12-25 MED ORDER — DEXAMETHASONE SODIUM PHOSPHATE 10 MG/ML IJ SOLN
10.0000 mg | Freq: Once | INTRAMUSCULAR | Status: AC
  Administered 2018-12-25: 10 mg via INTRAVENOUS
  Filled 2018-12-25: qty 1

## 2018-12-25 MED ORDER — DIPHENHYDRAMINE HCL 50 MG/ML IJ SOLN
25.0000 mg | Freq: Once | INTRAMUSCULAR | Status: AC
  Administered 2018-12-25: 25 mg via INTRAVENOUS
  Filled 2018-12-25: qty 1

## 2018-12-25 MED ORDER — HYDROXYZINE HCL 25 MG PO TABS: 25.0000 mg | ORAL_TABLET | Freq: Four times a day (QID) | ORAL | 0 refills | Status: DC | PRN

## 2018-12-25 MED ORDER — SODIUM CHLORIDE 0.9% FLUSH
3.0000 mL | Freq: Once | INTRAVENOUS | Status: AC
  Administered 2018-12-25: 3 mL via INTRAVENOUS

## 2018-12-25 NOTE — ED Notes (Signed)
Patient declines benadryl at this time. Full dinner tray provided.

## 2018-12-25 NOTE — Discharge Instructions (Addendum)
Stop taking the recently prescribed medication.  Start the prednisone prescription tomorrow.  The hydroxyzine cause drowsiness.  Follow-up with your primary doctor for recheck

## 2018-12-25 NOTE — ED Triage Notes (Signed)
Patient c/o lower abdominal pain and generalized body rash. Patient states the rash has been present for the past 3 weeks. Patient also reports bilateral nipple pain. Patient was recently treated with clindamycin.

## 2018-12-25 NOTE — ED Provider Notes (Signed)
[facility] EMERGENCY DEPARTMENT Provider Note   CSN: [id number] Arrival date & time: 12/25/18  1421     History   Chief Complaint Chief Complaint  Patient presents with   Abdominal Pain    HPI Meghan Carter is a 38 y.o. female.     HPI   Meghan Carter is a 38 y.o. female with past medical history of anxiety, anemia, and chronic abdominal pain, and previous episode of acute pancreatitis who presents to the Emergency Department complaining of generalized itching and rash.  Symptoms have been present for 3 weeks.  She describes constant itching all over her body, especially her back, abdomen, arms and vagina.  States symptoms began after taking clindamycin and starting a new birth control medication.  She states itching is gradually worsening in severity.  She states she is having difficulty sleeping due to itching during the night.  She has been taking Benadryl with minimal to no relief.  She reports having some lower abdominal pain, but states this is not the reason she presented to the emergency room and the abdominal pain has been chronic since having hemorrhoid surgery nearly 1 year ago.  She denies change in appetite, nausea, vomiting, fever, chills, facial swelling, swelling of her lips or tongue, and chest pain or shortness of breath.  Past Medical History:  Diagnosis Date   Anemia    Anxiety    Atherosclerosis of arteries    Blood transfusion without reported diagnosis 2017   2 units, no reaction   Depression    Dysmenorrhea    Endometrial polyp 02/11/2014   Fecal occult blood test positive 01/27/2014   Fibroid    History of abnormal cervical Pap smear 02/11/2014   HSV (herpes simplex virus) infection    Hydrosalpinx    Bilateral   Internal hemorrhoids    Menorrhagia with regular cycle 01/27/2014   Pelvic pain in female 01/27/2014   Rectal bleeding 01/27/2014   Vaginal Pap smear, abnormal     Patient Active Problem List   Diagnosis Date Noted     Depression, major, single episode, moderate ([place]) 11/19/2018   Anxiety 11/19/2018   Herpes simplex type 2 infection 01/24/2018   Internal and external bleeding hemorrhoids    Acute blood loss anemia 03/26/2015   Severe anemia    Lower abdominal pain    Weakness    Other fatigue    Dyspareunia due to medical condition in female 03/17/2015   High-tone pelvic floor dysfunction 03/17/2015   Rectal pain 03/19/2014   Hydrosalpinx 02/11/2014   Endometrial polyp 02/11/2014   History of abnormal cervical Pap smear 02/11/2014   Pelvic pain in female 01/27/2014   Menorrhagia with regular cycle 01/27/2014   Hemorrhoids, internal 01/27/2014   Fecal occult blood test positive 01/27/2014   History of acute pancreatitis 03/31/2011   Nondependent alcohol abuse, episodic drinking behavior 03/31/2011   Callus of foot 02/08/2011   Plantar fasciitis 0000000   Lichenification and lichen simplex chronicus 11/07/2010    Past Surgical History:  Procedure Laterality Date   CHROMOPERTUBATION  08/14/2017   Procedure: CHROMOPERTUBATION;  Surgeon: Governor Specking, MD;  Location: [facility];  Service: Gynecology;;   COLONOSCOPY N/A 03/23/2014   SLF: 1. The examined terminal ileum appeared to be normal. 2. The left colon is redundant 3. Moderate sized internal hemorrhoids   ECTOPIC PREGNANCY SURGERY Left 2005   FLEXIBLE SIGMOIDOSCOPY N/A 04/01/2015   SLF: rectal bleeding due to large internal hemorrhoids 2. anemia due to rectal bleeding/memses/  poor iron intake.   FLEXIBLE SIGMOIDOSCOPY N/A 03/27/2015   Procedure: FLEXIBLE SIGMOIDOSCOPY;  Surgeon: Danie Binder, MD;  Location: [facility];  Service: Endoscopy;  Laterality: N/A;   HEMORRHOID BANDING N/A 03/23/2014   Procedure: HEMORRHOID BANDING;  Surgeon: Danie Binder, MD;  [facility]: [facility];  Service: Endoscopy;  Laterality: N/A;   HEMORRHOID BANDING  03/27/2015   Procedure: HEMORRHOID BANDING;  Surgeon: Danie Binder, MD;  [facility];  Service: Endoscopy;;   HEMORRHOID SURGERY N/A 01/11/2018   Procedure: EXTENSIVE HEMORRHOIDECTOMY;  Surgeon: Aviva Signs, MD;  Location: [facility];  Service: General;  Laterality: N/A;   HYSTEROSCOPY W/D&C N/A 06/16/2014   Procedure: DILATATION AND CURETTAGE /HYSTEROSCOPY;  Surgeon: Jonnie Kind, MD;  Location: [facility];  Service: Gynecology;  Laterality: N/A;   LAPAROSCOPIC UNILATERAL SALPINGECTOMY Right 06/16/2014   Procedure: LAPAROSCOPIC UNILATERAL SALPINGECTOMY;  Surgeon: Jonnie Kind, MD;  Location: [facility];  Service: Gynecology;  Laterality: Right;   LAPAROSCOPY N/A 08/14/2017   Procedure: OPERATIVE LAPAROSCOPY ,LEFT SALPINGECTOMY, LYSIS OF ADHESIONS;  Surgeon: Governor Specking, MD;  Location: [facility];  Service: Gynecology;  Laterality: N/A;   POLYPECTOMY N/A 06/16/2014   Procedure: POLYPECTOMY (endometrial);  Surgeon: Jonnie Kind, MD;  Location: [facility];  Service: Gynecology;  Laterality: N/A;     OB History    Gravida  1   Para      Term      Preterm      AB  1   Living        SAB      TAB      Ectopic  1   Multiple      Live Births               Home Medications    Prior to Admission medications   Medication Sig Start Date End Date Taking? Authorizing Provider  busPIRone (BUSPAR) 5 MG tablet Take 1 tablet (5 mg total) by mouth 3 (three) times daily as needed. 11/19/18   Perlie Mayo, NP  clindamycin (CLEOCIN) 300 MG capsule Take 1 capsule (300 mg total) by mouth 3 (three) times daily. 12/06/18   Florian Buff, MD  desogestrel-ethinyl estradiol (APRI) 0.15-30 MG-MCG tablet Take 1 tablet by mouth daily. 12/06/18   Florian Buff, MD  escitalopram (LEXAPRO) 10 MG tablet Take 1 tablet (10 mg total) by mouth daily. 11/19/18   Perlie Mayo, NP  silver sulfADIAZINE (SILVADENE) 1 % cream Use to area 3 times per day 12/06/18   Florian Buff, MD  valACYclovir (VALTREX) 1000 MG tablet Take 1 tablet (1,000 mg total)  by mouth daily. 12/06/18   Florian Buff, MD    Family History Family History  Problem Relation Age of Onset   Diabetes Father    Diabetes Paternal Grandmother    Diabetes Paternal Grandfather     Social History Social History   Tobacco Use   Smoking status: Light Tobacco Smoker    Packs/day: 0.25    Years: 27.00    Pack years: 6.75    Types: Cigarettes   Smokeless tobacco: Never Used  Substance Use Topics   Alcohol use: Yes    Comment: "very little"   Drug use: No     Allergies   Doxycycline hyclate   Review of Systems Review of Systems  Constitutional: Negative for activity change, appetite change, chills and fever.  HENT: Negative for facial swelling, sore throat, trouble swallowing  and voice change.   Respiratory: Negative for chest tightness, shortness of breath and wheezing.   Gastrointestinal: Positive for abdominal pain. Negative for nausea and vomiting.  Genitourinary: Negative for dysuria, flank pain, vaginal bleeding and vaginal discharge.  Musculoskeletal: Negative for arthralgias, neck pain and neck stiffness.  Skin: Positive for rash. Negative for wound.       Generalized itching and rash  Neurological: Negative for dizziness, weakness, numbness and headaches.     Physical Exam Updated Vital Signs BP 115/80 (BP Location: Right Arm)    Pulse (!) 102    Temp 98.8 F (37.1 C) (Oral)    Resp 16    Ht 5\' 3"  (1.6 m)    Wt 93 kg    LMP 12/20/2018    SpO2 100%    BMI 36.31 kg/m   Physical Exam Vitals signs and nursing note reviewed.  Constitutional:      General: She is not in acute distress.    Appearance: She is well-developed. She is not ill-appearing.  HENT:     Head: Normocephalic.     Nose: Nose normal.     Mouth/Throat:     Mouth: Mucous membranes are moist.     Pharynx: Oropharynx is clear. Uvula midline. No pharyngeal swelling, oropharyngeal exudate or uvula swelling.     Comments: No edema of the face, lips or tongue.  Uvula is  midline and also nonedematous. Neck:     Musculoskeletal: Normal range of motion and neck supple.  Cardiovascular:     Rate and Rhythm: Normal rate and regular rhythm.     Pulses: Normal pulses.  Pulmonary:     Effort: Pulmonary effort is normal. No respiratory distress.     Breath sounds: Normal breath sounds.  Abdominal:     General: There is no distension.     Palpations: Abdomen is soft. There is no mass.     Tenderness: There is no abdominal tenderness.  Musculoskeletal: Normal range of motion.        General: No tenderness.     Right lower leg: No edema.     Left lower leg: No edema.  Lymphadenopathy:     Cervical: No cervical adenopathy.  Skin:    General: Skin is warm.     Findings: Rash present. No erythema.     Comments: Focal areas of excoriation to the bilateral upper arms, groin.  Few scattered slightly raised papules.  No welts or frank urticaria.  Neurological:     General: No focal deficit present.     Mental Status: She is alert and oriented to person, place, and time.     Sensory: No sensory deficit.     Motor: No abnormal muscle tone.      ED Treatments / Results  Labs (all labs ordered are listed, but only abnormal results are displayed) Labs Reviewed  LIPASE, BLOOD - Abnormal; Notable for the following components:      Result Value   Lipase 82 (*)    All other components within normal limits  COMPREHENSIVE METABOLIC PANEL - Abnormal; Notable for the following components:   Glucose, Bld 105 (*)    All other components within normal limits  URINALYSIS, ROUTINE W REFLEX MICROSCOPIC - Abnormal; Notable for the following components:   APPearance HAZY (*)    Hgb urine dipstick MODERATE (*)    Bacteria, UA RARE (*)    All other components within normal limits  CBC  POC URINE PREG, ED  EKG None  Radiology No results found.  Procedures Procedures (including critical care time)  Medications Ordered in ED Medications  sodium chloride flush  (NS) 0.9 % injection 3 mL (has no administration in time range)     Initial Impression / Assessment and Plan / ED Course  I have reviewed the triage vital signs and the nursing notes.  Pertinent labs & imaging results that were available during my care of the patient were reviewed by me and considered in my medical decision making (see chart for details).       Pt with generalized pruritis, slightly raised papules noted to the bilateral upper arms with excoriations present.  No frank urticaria.  Airway is patent without edema.  No swelling of the tongue, lips, or face.  Patient is anxious appearing.  On recheck, patient reports feeling better after Benadryl, steroid, Pepcid and Ativan.  No abdominal tenderness on exam.  She is requesting meal tray.  Patient has ate meal tray and drink fluids without difficulty.  No fever or vomiting.  She has been observed without complications of urticaria or angioedema.  Itching has improved.  Of note, lipase is slightly elevated.  On further history, patient does admit to history of pancreatitis and continues to drink alcohol several times per week.  She does not have abdominal tenderness on my exam or vomiting to suggest acute pancreatitis.  I have counseled her on maintaining proper diet and alcohol avoidance.  She is followed by GI and agrees to arrange follow-up appointment if needed.  Rash has improved here.  I feel she is appropriate for discharge home.  Will write prescriptions for Vistaril and prednisone taper.  Rash felt to be related to medications.  Advised her to stop the clindamycin.  She will follow-up with PCP.  Return precautions also discussed.    Final Clinical Impressions(s) / ED Diagnoses   Final diagnoses:  Allergic reaction to drug, initial encounter    ED Discharge Orders    None       Bufford Lope 12/25/18 2136    Ezequiel Essex, MD 12/25/18 2345

## 2019-01-09 ENCOUNTER — Encounter (HOSPITAL_COMMUNITY): Payer: Self-pay | Admitting: *Deleted

## 2019-01-09 ENCOUNTER — Emergency Department (HOSPITAL_COMMUNITY)
Admission: EM | Admit: 2019-01-09 | Discharge: 2019-01-09 | Disposition: A | Discharge status: Home / Self Care | Payer: PRIVATE HEALTH INSURANCE | Attending: Emergency Medicine | Admitting: Emergency Medicine

## 2019-01-09 ENCOUNTER — Other Ambulatory Visit: Payer: Self-pay

## 2019-01-09 DIAGNOSIS — R21 Rash and other nonspecific skin eruption: Secondary | ICD-10-CM | POA: Diagnosis not present

## 2019-01-09 DIAGNOSIS — N739 Female pelvic inflammatory disease, unspecified: Secondary | ICD-10-CM | POA: Insufficient documentation

## 2019-01-09 DIAGNOSIS — Z79899 Other long term (current) drug therapy: Secondary | ICD-10-CM | POA: Diagnosis not present

## 2019-01-09 DIAGNOSIS — F17210 Nicotine dependence, cigarettes, uncomplicated: Secondary | ICD-10-CM | POA: Insufficient documentation

## 2019-01-09 DIAGNOSIS — R1030 Lower abdominal pain, unspecified: Secondary | ICD-10-CM | POA: Diagnosis present

## 2019-01-09 DIAGNOSIS — N730 Acute parametritis and pelvic cellulitis: Secondary | ICD-10-CM

## 2019-01-09 LAB — CBC WITH DIFFERENTIAL/PLATELET
Abs Immature Granulocytes: 0.03 10*3/uL (ref 0.00–0.07)
Basophils Absolute: 0 10*3/uL (ref 0.0–0.1)
Basophils Relative: 0 %
Eosinophils Absolute: 1.6 10*3/uL — ABNORMAL HIGH (ref 0.0–0.5)
Eosinophils Relative: 20 %
HCT: 43.4 % (ref 36.0–46.0)
Hemoglobin: 13.9 g/dL (ref 12.0–15.0)
Immature Granulocytes: 0 %
Lymphocytes Relative: 36 %
Lymphs Abs: 3 10*3/uL (ref 0.7–4.0)
MCH: 28 pg (ref 26.0–34.0)
MCHC: 32 g/dL (ref 30.0–36.0)
MCV: 87.5 fL (ref 80.0–100.0)
Monocytes Absolute: 0.7 10*3/uL (ref 0.1–1.0)
Monocytes Relative: 8 %
Neutro Abs: 3 10*3/uL (ref 1.7–7.7)
Neutrophils Relative %: 36 %
Platelets: 248 10*3/uL (ref 150–400)
RBC: 4.96 MIL/uL (ref 3.87–5.11)
RDW: 13.3 % (ref 11.5–15.5)
WBC: 8.3 10*3/uL (ref 4.0–10.5)
nRBC: 0 % (ref 0.0–0.2)

## 2019-01-09 LAB — COMPREHENSIVE METABOLIC PANEL
ALT: 13 U/L (ref 0–44)
AST: 15 U/L (ref 15–41)
Albumin: 3.9 g/dL (ref 3.5–5.0)
Alkaline Phosphatase: 63 U/L (ref 38–126)
Anion gap: 6 (ref 5–15)
BUN: 12 mg/dL (ref 6–20)
CO2: 22 mmol/L (ref 22–32)
Calcium: 9 mg/dL (ref 8.9–10.3)
Chloride: 105 mmol/L (ref 98–111)
Creatinine, Ser: 0.73 mg/dL (ref 0.44–1.00)
GFR calc Af Amer: >60 mL/min (ref >60)
GFR calc non Af Amer: >60 mL/min (ref >60)
Glucose, Bld: 97 mg/dL (ref 70–99)
Potassium: 3.7 mmol/L (ref 3.5–5.1)
Sodium: 133 mmol/L — ABNORMAL LOW (ref 135–145)
Total Bilirubin: 0.5 mg/dL (ref 0.3–1.2)
Total Protein: 7.3 g/dL (ref 6.5–8.1)

## 2019-01-09 LAB — URINALYSIS, ROUTINE W REFLEX MICROSCOPIC
Bilirubin Urine: NEGATIVE
Glucose, UA: NEGATIVE mg/dL
Hgb urine dipstick: NEGATIVE
Ketones, ur: NEGATIVE mg/dL
Leukocytes,Ua: NEGATIVE
Nitrite: NEGATIVE
Protein, ur: NEGATIVE mg/dL
Specific Gravity, Urine: 1.024 (ref 1.005–1.030)
pH: 5 (ref 5.0–8.0)

## 2019-01-09 LAB — HIV ANTIBODY (ROUTINE TESTING W REFLEX): HIV Screen 4th Generation wRfx: NONREACTIVE

## 2019-01-09 LAB — LIPASE, BLOOD: Lipase: 42 U/L (ref 11–51)

## 2019-01-09 LAB — WET PREP, GENITAL
Clue Cells Wet Prep HPF POC: NONE SEEN
Sperm: NONE SEEN
Trich, Wet Prep: NONE SEEN
Yeast Wet Prep HPF POC: NONE SEEN

## 2019-01-09 LAB — RPR: RPR Ser Ql: NONREACTIVE

## 2019-01-09 MED ORDER — METRONIDAZOLE 500 MG PO TABS
500.0000 mg | ORAL_TABLET | Freq: Once | ORAL | Status: AC
  Administered 2019-01-09: 500 mg via ORAL
  Filled 2019-01-09: qty 1

## 2019-01-09 MED ORDER — PREDNISONE 20 MG PO TABS
20.0000 mg | ORAL_TABLET | Freq: Once | ORAL | Status: AC
  Administered 2019-01-09: 20 mg via ORAL
  Filled 2019-01-09: qty 1

## 2019-01-09 MED ORDER — DOXEPIN HCL 10 MG PO CAPS: 10.0000 mg | ORAL_CAPSULE | Freq: Three times a day (TID) | ORAL | 0 refills | Status: DC | PRN

## 2019-01-09 MED ORDER — DIPHENHYDRAMINE HCL 50 MG/ML IJ SOLN
25.0000 mg | Freq: Once | INTRAMUSCULAR | Status: AC
  Administered 2019-01-09: 25 mg via INTRAMUSCULAR

## 2019-01-09 MED ORDER — PREDNISONE 20 MG PO TABS: 20.0000 mg | ORAL_TABLET | Freq: Every day | ORAL | 0 refills | Status: DC

## 2019-01-09 MED ORDER — CEFTRIAXONE SODIUM 250 MG IJ SOLR
250.0000 mg | Freq: Once | INTRAMUSCULAR | Status: AC
  Administered 2019-01-09: 250 mg via INTRAMUSCULAR
  Filled 2019-01-09: qty 250

## 2019-01-09 MED ORDER — AZITHROMYCIN 250 MG PO TABS
1000.0000 mg | ORAL_TABLET | Freq: Once | ORAL | Status: AC
  Administered 2019-01-09: 1000 mg via ORAL
  Filled 2019-01-09: qty 4

## 2019-01-09 MED ORDER — METRONIDAZOLE 500 MG PO TABS: 500.0000 mg | ORAL_TABLET | Freq: Two times a day (BID) | ORAL | 0 refills | Status: DC

## 2019-01-09 MED ORDER — FAMOTIDINE 20 MG PO TABS
20.0000 mg | ORAL_TABLET | Freq: Once | ORAL | Status: AC
  Administered 2019-01-09: 20 mg via ORAL
  Filled 2019-01-09: qty 1

## 2019-01-09 MED ORDER — STERILE WATER FOR INJECTION IJ SOLN
INTRAMUSCULAR | Status: AC
  Filled 2019-01-09: qty 10

## 2019-01-09 MED ORDER — DIPHENHYDRAMINE HCL 50 MG/ML IJ SOLN
25.0000 mg | Freq: Once | INTRAMUSCULAR | Status: DC
  Filled 2019-01-09: qty 1

## 2019-01-09 MED ORDER — AZITHROMYCIN 250 MG PO TABS: 250.0000 mg | ORAL_TABLET | Freq: Every day | ORAL | 0 refills | Status: DC

## 2019-01-09 NOTE — ED Provider Notes (Signed)
[facility] EMERGENCY DEPARTMENT Provider Note   CSN: [id number] Arrival date & time: 01/09/19  0023    History   Chief Complaint Chief Complaint  Patient presents with  . Abdominal Pain    HPI Meghan Carter is a 38 y.o. female.   The history is provided by the patient.  Abdominal Pain She has history of anxiety, depression, HSV infection and comes in complaining of generalized itching, lower abdominal pain, intermittent breast pain.  Symptoms have been present for over a month.  She was initially seen by her gynecologist for pelvic issues who prescribed clindamycin, valacyclovir.  She was seen on November 18 because of having developed a rash and itching.  She was prescribed hydroxyzine and a prednisone taper and symptoms improved only to recur 3 days ago.  She is now complaining of generalized itching and faint rash diffusely.  She continues to have bilateral suprapubic pain.  She has intermittent breast pain with sensation like her nipples are freezing and become hard but in between episodes, there is no breast pain in her nipples are normal consistency.  She denies any actual vaginal discharge but does state there has been a vaginal odor.  She had stopped taking the valacyclovir and clindamycin as of the prior ED visit.  He states hydroxyzine had not been helping, so she stopped taking that as well.  She denies fever or chills and denies nausea or vomiting.  She is status post tubal ligation and states that she has not been sexually active for 3 months.  Past Medical History:  Diagnosis Date  . Anemia   . Anxiety   . Atherosclerosis of arteries   . Blood transfusion without reported diagnosis 2017   2 units, no reaction  . Depression   . Dysmenorrhea   . Endometrial polyp 02/11/2014  . Fecal occult blood test positive 01/27/2014  . Fibroid   . History of abnormal cervical Pap smear 02/11/2014  . HSV (herpes simplex virus) infection   . Hydrosalpinx    Bilateral  . Internal  hemorrhoids   . Menorrhagia with regular cycle 01/27/2014  . Pelvic pain in female 01/27/2014  . Rectal bleeding 01/27/2014  . Vaginal Pap smear, abnormal     Patient Active Problem List   Diagnosis Date Noted  . Depression, major, single episode, moderate ([place]) 11/19/2018  . Anxiety 11/19/2018  . 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 Internal and external bleeding hemorrhoids   . Acute blood loss anemia 03/26/2015  . Severe anemia   . Lower abdominal pain   . Weakness   . Other fatigue   . Dyspareunia due to medical condition in female 03/17/2015  . High-tone pelvic floor dysfunction 03/17/2015  . Rectal pain 03/19/2014  . 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 Endometrial polyp 02/11/2014  . History of abnormal cervical Pap smear 02/11/2014  . Pelvic pain in female 01/27/2014  . Menorrhagia with regular cycle 01/27/2014  . Hemorrhoids, internal 01/27/2014  . Fecal occult blood test positive 01/27/2014  . History of acute pancreatitis 03/31/2011  . Nondependent alcohol abuse, episodic drinking behavior 03/31/2011  . Callus of foot 02/08/2011  . Plantar fasciitis 02/08/2011  . Lichenification and lichen simplex chronicus 11/07/2010    Past Surgical History:  Procedure Laterality Date  . CHROMOPERTUBATION  08/14/2017   Procedure: CHROMOPERTUBATION;  Surgeon: Governor Specking, MD;  Location: [facility];  Service: Gynecology;;  . COLONOSCOPY N/A 03/23/2014   SLF: 1. The examined terminal ileum appeared to be normal. 2. The  left colon is redundant 3. Moderate sized internal hemorrhoids  . ECTOPIC PREGNANCY SURGERY Left 2005  . FLEXIBLE SIGMOIDOSCOPY N/A 04/01/2015   SLF: rectal bleeding due to large internal hemorrhoids 2. anemia due to rectal bleeding/memses/ poor iron intake.  Marland Kitchen FLEXIBLE SIGMOIDOSCOPY N/A 03/27/2015   Procedure: FLEXIBLE SIGMOIDOSCOPY;  Surgeon: Danie Binder, MD;  Location: [facility];  Service: Endoscopy;  Laterality: N/A;  . HEMORRHOID BANDING N/A 03/23/2014    Procedure: HEMORRHOID BANDING;  Surgeon: Danie Binder, MD;  Location: [facility];  Service: Endoscopy;  Laterality: N/A;  . HEMORRHOID BANDING  03/27/2015   Procedure: HEMORRHOID BANDING;  Surgeon: Danie Binder, MD;  Location: [facility];  Service: Endoscopy;;  . HEMORRHOID SURGERY N/A 01/11/2018   Procedure: EXTENSIVE HEMORRHOIDECTOMY;  Surgeon: Aviva Signs, MD;  Location: [facility];  Service: General;  Laterality: N/A;  . HYSTEROSCOPY W/D&C N/A 06/16/2014   Procedure: DILATATION AND CURETTAGE /HYSTEROSCOPY;  Surgeon: Jonnie Kind, MD;  Location: [facility];  Service: Gynecology;  Laterality: N/A;  . LAPAROSCOPIC UNILATERAL SALPINGECTOMY Right 06/16/2014   Procedure: LAPAROSCOPIC UNILATERAL SALPINGECTOMY;  Surgeon: Jonnie Kind, MD;  Location: [facility];  Service: Gynecology;  Laterality: Right;  . LAPAROSCOPY N/A 08/14/2017   Procedure: OPERATIVE LAPAROSCOPY ,LEFT SALPINGECTOMY, LYSIS OF ADHESIONS;  Surgeon: Governor Specking, MD;  Location: [facility];  Service: Gynecology;  Laterality: N/A;  . POLYPECTOMY N/A 06/16/2014   Procedure: POLYPECTOMY (endometrial);  Surgeon: Jonnie Kind, MD;  Location: [facility];  Service: Gynecology;  Laterality: N/A;     OB History    Gravida  1   Para      Term      Preterm      AB  1   Living        SAB      TAB      Ectopic  1   Multiple      Live Births               Home Medications    Prior to Admission medications   Medication Sig Start Date End Date Taking? Authorizing Provider  busPIRone (BUSPAR) 5 MG tablet Take 1 tablet (5 mg total) by mouth 3 (three) times daily as needed. Patient taking differently: Take 5 mg by mouth 3 (three) times daily as needed (for anxiety).  11/19/18   Perlie Mayo, NP  clindamycin (CLEOCIN) 300 MG capsule Take 1 capsule (300 mg total) by mouth 3 (three) times daily. 12/06/18   Florian Buff, MD  desogestrel-ethinyl estradiol (APRI) 0.15-30 MG-MCG tablet Take 1 tablet by mouth daily. 12/06/18    Florian Buff, MD  diphenhydrAMINE (BENADRYL) 25 MG tablet Take 25 mg by mouth every 6 (six) hours as needed for itching.    [provider]  escitalopram (LEXAPRO) 10 MG tablet Take 1 tablet (10 mg total) by mouth daily. 11/19/18   Perlie Mayo, NP  hydrOXYzine (ATARAX/VISTARIL) 25 MG tablet Take 1 tablet (25 mg total) by mouth every 6 (six) hours as needed for itching. 12/25/18   Triplett, Tammy, PA-C  predniSONE (DELTASONE) 10 MG tablet Take 6 tablets day one, 5 tablets day two, 4 tablets day three, 3 tablets day four, 2 tablets day five, then 1 tablet day six 12/25/18   Triplett, Tammy, PA-C  silver sulfADIAZINE (SILVADENE) 1 % cream Use to area 3 times per day 12/06/18   Florian Buff, MD  valACYclovir (VALTREX) 1000 MG tablet Take 1 tablet (  1,000 mg total) by mouth daily. 12/06/18   Florian Buff, MD    Family History Family History  Problem Relation Age of Onset  . Diabetes Father   . Diabetes Paternal Grandmother   . Diabetes Paternal Grandfather     Social History Social History   Tobacco Use  . Smoking status: Light Tobacco Smoker    Packs/day: 0.25    Years: 27.00    Pack years: 6.75    Types: Cigarettes  . Smokeless tobacco: Never Used  Substance Use Topics  . Alcohol use: Yes    Comment: "very little"  . Drug use: No     Allergies   Doxycycline hyclate   Review of Systems Review of Systems  Gastrointestinal: Positive for abdominal pain.  All other systems reviewed and are negative.    Physical Exam Updated Vital Signs BP 123/77 (BP Location: Right Arm)   Pulse 98   Temp 99.6 F (37.6 C) (Oral)   Resp 18   Ht 5\' 3"  (1.6 m)   Wt 93 kg   LMP 12/20/2018   SpO2 100%   BMI 36.31 kg/m   Physical Exam Vitals signs and nursing note reviewed.    38 year old female, resting comfortably and in no acute distress. Vital signs are normal. Oxygen saturation is 100%, which is normal. Head is normocephalic and atraumatic. PERRLA, EOMI.  Oropharynx is clear. Neck is nontender and supple without adenopathy or JVD. Back is nontender and there is no CVA tenderness. Lungs are clear without rales, wheezes, or rhonchi. Chest is nontender. Heart has regular rate and rhythm without murmur. Abdomen is soft, flat, with mild bilateral suprapubic tenderness.  There is no rebound or guarding.  There are no masses or hepatosplenomegaly and peristalsis is normoactive. Pelvic: Normal external female genitalia.  Moderate amount of thick white discharge present.  Cervix is closed.  Moderate cervical motion tenderness.  Moderate bilateral adnexal tenderness which is worse on the right.  No adnexal masses.  Fundus normal size and position. Extremities have no cyanosis or edema, full range of motion is present. Skin is warm and dry.  There is very faint raised rash rather diffusely which is nonspecific in appearance but does not appear typically urticarial. Neurologic: Mental status is normal, cranial nerves are intact, there are no motor or sensory deficits.  ED Treatments / Results  Labs (all labs ordered are listed, but only abnormal results are displayed) Labs Reviewed - No data to display  EKG None  Radiology No results found.  Procedures Procedures   Medications Ordered in ED Medications - No data to display   Initial Impression / Assessment and Plan / ED Course  I have reviewed the triage vital signs and the nursing notes.  Pertinent labs & imaging results that were available during my care of the patient were reviewed by me and considered in my medical decision making (see chart for details).  Multiple complaints including rash with itching, suprapubic pain, intermittent breast and nipple pain.  Exam is concerning for pelvic inflammatory disease.  Will check screening labs.  Old records reviewed confirming office visit on October 30 at which time she was prescribed valacyclovir and clindamycin for folliculitis, ED visit on  November 18 with prescription for prednisone taper and hydroxyzine.  Wet prep is unremarkable.  No evidence of yeast or clue cells.  CBC and metabolic panel are remarkable for eosinophilia.  Patient is concerned about her lipase, so that is checked and it is normal.  She is given an injection of ceftriaxone and a dose of oral azithromycin and metronidazole.  Unfortunately, she is allergic to doxycycline, so alternate regimen for treatment of PID is used of daily azithromycin and metronidazole.  She is also given a 2-week course of moderate dose prednisone.  Given prescription for doxepin for itching.  She is to follow-up with her PCP in 2 weeks to make sure she had good response from the antibiotics, advised to follow-up with dermatology if rash and itching persist.  Final Clinical Impressions(s) / ED Diagnoses   Final diagnoses:  PID (acute pelvic inflammatory disease)  Generalized rash    ED Discharge Orders         Ordered    azithromycin (ZITHROMAX) 250 MG tablet  Daily     01/09/19 0549    metroNIDAZOLE (FLAGYL) 500 MG tablet  2 times daily     01/09/19 0549    predniSONE (DELTASONE) 20 MG tablet  Daily     01/09/19 0549    doxepin (SINEQUAN) 10 MG capsule  3 times daily PRN     01/09/19 0000000           Delora Fuel, MD [id number] [phone]

## 2019-01-09 NOTE — ED Triage Notes (Signed)
Pt c/o abdominal pain with vaginal itching and breast and nipple pain; pt states she was seen here a few weeks ago and prescribed clindamycin; pt states she has hives all over her body x 3 days

## 2019-01-09 NOTE — Discharge Instructions (Signed)
Continue taking diphenhydramine (Benadryl) as needed for itching. You may take famotidine (Pepcid AC) twice a day to get better control of itching.  Doxepin is an antidepressant, but it helps a lot with itching. Start taking it three times a day, then if you are doing well after five days, only take it as needed.  If the itching persists, follow up with the dermatologist.

## 2019-01-09 NOTE — ED Notes (Signed)
PT refused NT to update v/s. Designer, multimedia notified

## 2019-01-10 LAB — GC/CHLAMYDIA PROBE AMP (~~LOC~~) NOT AT ARMC
Chlamydia: NEGATIVE
Neisseria Gonorrhea: NEGATIVE

## 2019-01-14 ENCOUNTER — Telehealth (INDEPENDENT_AMBULATORY_CARE_PROVIDER_SITE_OTHER): Payer: PRIVATE HEALTH INSURANCE | Admitting: Family Medicine

## 2019-01-14 ENCOUNTER — Other Ambulatory Visit: Payer: Self-pay

## 2019-01-14 ENCOUNTER — Encounter: Payer: Self-pay | Admitting: Family Medicine

## 2019-01-14 DIAGNOSIS — Z09 Encounter for follow-up examination after completed treatment for conditions other than malignant neoplasm: Secondary | ICD-10-CM

## 2019-01-14 DIAGNOSIS — F3341 Major depressive disorder, recurrent, in partial remission: Secondary | ICD-10-CM | POA: Diagnosis not present

## 2019-01-14 DIAGNOSIS — L299 Pruritus, unspecified: Secondary | ICD-10-CM

## 2019-01-14 DIAGNOSIS — T7840XA Allergy, unspecified, initial encounter: Secondary | ICD-10-CM | POA: Diagnosis not present

## 2019-01-14 DIAGNOSIS — Z79899 Other long term (current) drug therapy: Secondary | ICD-10-CM

## 2019-01-14 DIAGNOSIS — F419 Anxiety disorder, unspecified: Secondary | ICD-10-CM

## 2019-01-14 DIAGNOSIS — L298 Other pruritus: Secondary | ICD-10-CM

## 2019-01-14 MED ORDER — CETIRIZINE HCL 10 MG PO TABS: 10.0000 mg | ORAL_TABLET | Freq: Every day | ORAL | 3 refills | Status: DC

## 2019-01-14 NOTE — Patient Instructions (Signed)
  I appreciate the opportunity to provide you with care for your health and wellness. Today we discussed: on going itching and allergies  Follow up: 01/17/2019 at 11 am by phone  No labs or referrals today  Please take your new medication: zyrtec daily.  Please take all medications we reviewed as directed. Please hydrate well with water daily.  Please only take plain baths, no add ins.  Avoid over dry skin-apply plain unscented baby lotion or aveeno lotion for sensitive skin daily after shower or bath. Avoid this in private areas.  Take time daily for yourself-even if just 5 minutes, you must care for you to avoid being overstressed, but makes your body stressed.  Please do not stop taking your medications without calling me. You anxiety and depression medication are very helpful in making sure you are doing okay daily.   Give yourself patience and grace to handle the stresses you have going on. It is normal to feel stress, but do not let it run your daily life.   I hope you have a wonderful, happy, safe, and healthy Holiday Season!  Please continue to practice social distancing to keep you, your family, and our community safe. If you must go out, please wear a mask and practice good handwashing.  It was a pleasure to see you and I look forward to continuing to work together on your health and well-being. Please do not hesitate to call the office if you need care or have questions about your care.  Have a wonderful day and week. With Gratitude, Cherly Beach, DNP, AGNP-BC

## 2019-01-14 NOTE — Progress Notes (Signed)
Virtual Visit via Telephone Note   This visit type was conducted due to national recommendations for restrictions regarding the COVID-19 Pandemic (e.g. social distancing) in an effort to limit this patient's exposure and mitigate transmission in our community.  Due to her co-morbid illnesses, this patient is at least at moderate risk for complications without adequate follow up.  This format is felt to be most appropriate for this patient at this time.  The patient did not have access to video technology/had technical difficulties with video requiring transitioning to audio format only (telephone).  All issues noted in this document were discussed and addressed.  No physical exam could be performed with this format.     Evaluation Performed:  Follow-up visit  Date:  01/14/2019   ID:  Meghan Carter, DOB [birth year], MRN [id number]  Patient Location: Home Provider Location: Office  Location of Patient: Home Location of Provider: Telehealth Consent was obtain for visit to be over via telehealth. I verified that I am speaking with the correct person using two identifiers.  PCP:  Perlie Mayo, NP   Chief Complaint:  Follow up from ED visits -on going allergy  History of Present Illness:    Meghan Carter is a 38 y.o. female with history of anxiety, depression, HSV infection and comes in today for follow-up from emergency room visit with ongoing allergy.  She presented to the emergency room on December 3.  She had complaints of generalized itching, lower abdominal pain, intermittent breast pain.  Her symptoms have been present for a month and when she was originally seen on November 18 because of a developing rash and itch secondary to medication started by her gynecologist for pelvic issues she was prescribed clindamycin and valacyclovir at that time.  She was prescribed hydroxyzine and prednisone taper when she went to the emergency room back on November 18.  Symptoms improved only to  recur 3 days within stopping the medications.  Presentation back to the emergency room demonstrated generalized itching and a faint rash diffused she also continued to have bilateral suprapubic pain.  She denied any vaginal discharge but reported an odor.  She is stopped taking her valacyclovir and clindamycin prior to going to the emergency room on December 3.  She stated hydroxyzine had not been helping so she stopped taking that as well.  She denied fevers chills nausea vomiting at that visit.  She is status post tubal ligation and stated that she had not been sexually active for at least 3 months.  Her PE in the emergency room was concerning for pelvic inflammatory disease.  They did check screening labs.  She was prescribed the valacyclovir and clindamycin for folliculitis.  The ED visit on November 18 she was prescribed a prednisone taper and hydroxyzine.  Her wet prep in the emergency room was unremarkable no evidence of yeast or clue cells.  CBC and metabolic panel are remarkable for eosinophilia.  Patient was concerned about her lipase so that was checked and it was normal.  She was given an injection of ceftriaxone and a dose of oral azithromycin and metronidazole prescriptions.  Unfortunately she is allergic to doxycycline so alternative regimen for PID was used.  She was given a 2-week course and moderate dose prednisone.  Given prescription for Doxy pen for itching.  And advised to follow-up with PCP in 2 weeks to make sure she had a good response from antibiotics and follow-up with dermatology if rash and itching persisted.  Today she is highly upset and frustrated with the whole situation.  Reports that she is not getting much sleep.  Reports that she stopped taking her BuSpar, Lexapro and most previous medications because she thought she was taking too many medications and they were causing a reaction.  Her anxiety level is very high at this time.  She reports that she is not sleeping well she  is not getting the work done that she needs for school overall she is very frustrated and concerned about the whole issue and does not understand why this is not getting under control.  She reports ongoing itching being the #1 symptom and discomfort.  She reports high anxiety related to a recent family death and other concerns in addition to school and work stress.  The patient does not have symptoms concerning for COVID-19 infection (fever, chills, cough, or new shortness of breath).   Past Medical, Surgical, Social History, Allergies, and Medications have been Reviewed.  Past Medical History:  Diagnosis Date   Anemia    Anxiety    Atherosclerosis of arteries    Blood transfusion without reported diagnosis 2017   2 units, no reaction   Depression    Dysmenorrhea    Endometrial polyp 02/11/2014   Fecal occult blood test positive 01/27/2014   Fibroid    History of abnormal cervical Pap smear 02/11/2014   HSV (herpes simplex virus) infection    Hydrosalpinx    Bilateral   Internal hemorrhoids    Menorrhagia with regular cycle 01/27/2014   Pelvic pain in female 01/27/2014   Rectal bleeding 01/27/2014   Vaginal Pap smear, abnormal    Past Surgical History:  Procedure Laterality Date   CHROMOPERTUBATION  08/14/2017   Procedure: CHROMOPERTUBATION;  Surgeon: Governor Specking, MD;  Location: [facility];  Service: Gynecology;;   COLONOSCOPY N/A 03/23/2014   SLF: 1. The examined terminal ileum appeared to be normal. 2. The left colon is redundant 3. Moderate sized internal hemorrhoids   ECTOPIC PREGNANCY SURGERY Left 2005   FLEXIBLE SIGMOIDOSCOPY N/A 04/01/2015   SLF: rectal bleeding due to large internal hemorrhoids 2. anemia due to rectal bleeding/memses/ poor iron intake.   FLEXIBLE SIGMOIDOSCOPY N/A 03/27/2015   Procedure: FLEXIBLE SIGMOIDOSCOPY;  Surgeon: Danie Binder, MD;  [facility]: [facility];  Service: Endoscopy;  Laterality: N/A;   HEMORRHOID BANDING N/A  03/23/2014   Procedure: HEMORRHOID BANDING;  Surgeon: Danie Binder, MD;  [facility];  Service: Endoscopy;  Laterality: N/A;   HEMORRHOID BANDING  03/27/2015   Procedure: HEMORRHOID BANDING;  Surgeon: Danie Binder, MD;  [facility];  Service: Endoscopy;;   HEMORRHOID SURGERY N/A 01/11/2018   Procedure: EXTENSIVE HEMORRHOIDECTOMY;  Surgeon: Aviva Signs, MD;  Location: [facility];  Service: General;  Laterality: N/A;   HYSTEROSCOPY W/D&C N/A 06/16/2014   Procedure: DILATATION AND CURETTAGE /HYSTEROSCOPY;  Surgeon: Jonnie Kind, MD;  Location: [facility];  Service: Gynecology;  Laterality: N/A;   LAPAROSCOPIC UNILATERAL SALPINGECTOMY Right 06/16/2014   Procedure: LAPAROSCOPIC UNILATERAL SALPINGECTOMY;  Surgeon: Jonnie Kind, MD;  Location: [facility];  Service: Gynecology;  Laterality: Right;   LAPAROSCOPY N/A 08/14/2017   Procedure: OPERATIVE LAPAROSCOPY ,LEFT SALPINGECTOMY, LYSIS OF ADHESIONS;  Surgeon: Governor Specking, MD;  Location: [facility];  Service: Gynecology;  Laterality: N/A;   POLYPECTOMY N/A 06/16/2014   Procedure: POLYPECTOMY (endometrial);  Surgeon: Jonnie Kind, MD;  Location: [facility];  Service: Gynecology;  Laterality: N/A;     Current Meds  Medication Sig   azithromycin (ZITHROMAX) 250 MG tablet Take 1 tablet (250 mg total) by mouth daily.   busPIRone (BUSPAR) 5 MG tablet Take 1 tablet (5 mg total) by mouth 3 (three) times daily as needed. (Patient taking differently: Take 5 mg by mouth 3 (three) times daily as needed (for anxiety). )   diphenhydrAMINE (BENADRYL) 25 MG tablet Take 25 mg by mouth every 6 (six) hours as needed for itching.   doxepin (SINEQUAN) 10 MG capsule Take 1 capsule (10 mg total) by mouth 3 (three) times daily as needed (itching).   escitalopram (LEXAPRO) 10 MG tablet Take 1 tablet (10 mg total) by mouth daily.   metroNIDAZOLE (FLAGYL) 500 MG tablet Take 1 tablet (500 mg total) by mouth 2 (two) times daily.   predniSONE  (DELTASONE) 20 MG tablet Take 1 tablet (20 mg total) by mouth daily.   silver sulfADIAZINE (SILVADENE) 1 % cream Use to area 3 times per day   valACYclovir (VALTREX) 1000 MG tablet Take 1 tablet (1,000 mg total) by mouth daily.   [DISCONTINUED] clindamycin (CLEOCIN) 300 MG capsule Take 1 capsule (300 mg total) by mouth 3 (three) times daily.   [DISCONTINUED] desogestrel-ethinyl estradiol (APRI) 0.15-30 MG-MCG tablet Take 1 tablet by mouth daily.   [DISCONTINUED] hydrOXYzine (ATARAX/VISTARIL) 25 MG tablet Take 1 tablet (25 mg total) by mouth every 6 (six) hours as needed for itching.     Allergies:   Clindamycin/lincomycin and Doxycycline hyclate   Social History   Tobacco Use   Smoking status: Light Tobacco Smoker    Packs/day: 0.25    Years: 27.00    Pack years: 6.75    Types: Cigarettes   Smokeless tobacco: Never Used  Substance Use Topics   Alcohol use: Yes    Comment: "very little"   Drug use: No     Family Hx: The patient's family history includes Diabetes in her father, paternal grandfather, and paternal grandmother.  ROS:   Please see the history of present illness.    All other systems reviewed and are negative.   Labs/Other Tests and Data Reviewed:     Recent Labs: 01/09/2019: ALT 13; BUN 12; Creatinine, Ser 0.73; Hemoglobin 13.9; Platelets 248; Potassium 3.7; Sodium 133   Recent Lipid Panel No results found for: CHOL, TRIG, HDL, CHOLHDL, LDLCALC, LDLDIRECT  Wt Readings from Last 3 Encounters:  01/09/19 205 lb (93 kg)  12/25/18 205 lb (93 kg)  12/06/18 208 lb (94.3 kg)     Objective:    Vital Signs:  LMP 12/20/2018    GEN:  alert and oriented RESPIRATORY:  no shortness of breath in conversation  PSYCH:  anxious and depressed, good communication    ASSESSMENT & PLAN:    1. Allergy, initial encounter Signs and symptoms of questionable allergy either related to antibiotics/medications started on several weeks back.  And or exposure to ongoing  allergen that she is not currently aware of.  We will be starting Zyrtec daily.  Encouraged to continue prednisone at this time.  Encouraged to take all the medications that she is recently been prescribed.  Clindamycin was added to her allergy list to avoid any other possible future reactions.  Educated on possible causes of an allergy and the contributory events of stress as well as certain foods and or products that could be causing her to have ongoing rash and itching.  Reviewed side effects, risks and benefits of medication.   Patient acknowledged agreement and understanding of the plan.    -  cetirizine (ZYRTEC) 10 MG tablet; Take 1 tablet (10 mg total) by mouth daily.  Dispense: 30 tablet; Refill: 3  2. Recurrent major depressive disorder, in partial remission ([place]) Reported she stopped taking her Lexapro and BuSpar because she was on some any medications from the emergency room.  I was not aware of this until today.  Strongly advised her to start taking her Lexapro again.  Does not need to take BuSpar at this time as she is taking doxepin which has a similar effect advised for her to hold the BuSpar back until we get her allergies under control and we would transfer her back from doxepin to BuSpar.  She is advised to try to get back a hold of the therapist as well she reports that she did try to call but has not received a call back.  Additionally she was encouraged not to stop her medications prior to discussing the need for tapering or adjustment of medications with me.  Reviewed side effects, risks and benefits of medication.   Patient acknowledged agreement and understanding of the plan.    3. Anxiety Anxiety appears to be uncontrolled at this time.  Extremely stressed out and unsure of why she is having this ongoing rash, itching.  As stated above had stopped her medications without making me aware therefore amplifying her stress related to the events as well.  Also has lost a family member  here recently.  That is made her very stressed out and upset.  Has a lot of schoolwork due for the end of the semester.  Reports not doing anything self-care wise at this time.  Educated on things that she could do to promote self-care.  Provided extensive education on medication   Reviewed side effects, risks and benefits of medication.   Patient acknowledged agreement and understanding of the plan.   4. Itching Itching is either related to some possible allergy, as she did have an increase in sensing of feels on her lab work.  Unsure of if this was a tapering and it was higher at one point time as there was not a differential drawn at previous visit to the emergency room.  Given the fact that she does have this elevation I have started her on Zyrtec to see if she has an allergy baseline that is going on.  Provided education on the impact of depression and anxiety in its role of hypersensitivities in itching and rash if not addressed.  Additionally advised for her to not take baths with vinegar or other bubble baths at this time.  As this might be drying out her skin and causing irritation.  Also provided with education on maintaining hydration and stress management.   Reviewed side effects, risks and benefits of medication.   Patient acknowledged agreement and understanding of the plan.    5. Medication management Extensive education provided in regards to all medications that were on the medication list.  In addition clindamycin was added to her allergy list.  As she believes this is what caused her allergic reaction in the first place.  She was encouraged to maintain her antibiotics given to her from the emergency room until completed.  In addition to the prednisone.  Additionally I have added Zyrtec to help with a baseline possible allergy.  Also encouraged her to restart her Lexapro as well.  I have removed several medications from her medication list as we discussed.  In addition to what was  stated above.   6. Encounter  for examination following treatment at hospital As stated above in HPI and in diagnosis and plan.  Extensive review of medications.  Extensive review of both emergency room visits recently.  Extensive review of notes and labs.  Development of personalized plan discussed with patient.  Follow-up closely for further evaluation and treatment as needed.   Time:   Today, I have spent 20 minutes with the patient with telehealth technology discussing the above problems.     Medication Adjustments/Labs and Tests Ordered: Current medicines are reviewed at length with the patient today.  Concerns regarding medicines are outlined above.   Tests Ordered: No orders of the defined types were placed in this encounter.   Medication Changes: No orders of the defined types were placed in this encounter.   Disposition:  Follow up Friday 01/17/2019  Signed, Perlie Mayo, NP  01/14/2019 9:41 AM     [facility]

## 2019-01-16 ENCOUNTER — Encounter: Payer: Self-pay | Admitting: Family Medicine

## 2019-01-17 ENCOUNTER — Other Ambulatory Visit: Payer: Self-pay

## 2019-01-17 ENCOUNTER — Ambulatory Visit: Payer: PRIVATE HEALTH INSURANCE | Admitting: Family Medicine

## 2019-01-23 ENCOUNTER — Encounter: Payer: Self-pay | Admitting: Family Medicine

## 2019-01-23 ENCOUNTER — Other Ambulatory Visit: Payer: Self-pay

## 2019-01-23 ENCOUNTER — Ambulatory Visit (INDEPENDENT_AMBULATORY_CARE_PROVIDER_SITE_OTHER): Payer: PRIVATE HEALTH INSURANCE | Admitting: Family Medicine

## 2019-01-23 DIAGNOSIS — F419 Anxiety disorder, unspecified: Secondary | ICD-10-CM

## 2019-01-23 DIAGNOSIS — L299 Pruritus, unspecified: Secondary | ICD-10-CM

## 2019-01-23 DIAGNOSIS — F3341 Major depressive disorder, recurrent, in partial remission: Secondary | ICD-10-CM | POA: Diagnosis not present

## 2019-01-23 DIAGNOSIS — Z79899 Other long term (current) drug therapy: Secondary | ICD-10-CM | POA: Insufficient documentation

## 2019-01-23 DIAGNOSIS — Z0000 Encounter for general adult medical examination without abnormal findings: Secondary | ICD-10-CM | POA: Insufficient documentation

## 2019-01-23 NOTE — Patient Instructions (Addendum)
  I appreciate the opportunity to provide you with care for your health and wellness. Today we discussed: medication management and anxiety and itching  Follow up: By Jan 19th by phone   No labs or referrals today  Please continue to take zyrtec daily-you can take this at night.  For possible sleepy side effects. In addition you can take your Lexapro at night as well to make this easier. Please start taking the BuSpar again as needed for anxiety and feelings of anxiousness.  Please only take the doxepin as needed for itching. Please hydrate well with water daily.  Again in review only take plain baths, no add ins.  Avoid over dry skin-apply plain unscented baby lotion or aveeno lotion for sensitive skin daily after shower or bath. Avoid this in private areas.  Homework over break: Figure out a plan to help identify and handle stress once the school semester starts back. This can include working out, self-care, journaling, music, dancing anything that helps you feel better and get your mind in a better place.  I hope you have a wonderful, happy, safe, and healthy Holiday Season! See you in the New Year :)  Please continue to practice social distancing to keep you, your family, and our community safe.  If you must go out, please wear a mask and practice good handwashing.  It was a pleasure to see you and I look forward to continuing to work together on your health and well-being. Please do not hesitate to call the office if you need care or have questions about your care.  Have a wonderful day and week. With Gratitude, Cherly Beach, DNP, AGNP-BC

## 2019-01-23 NOTE — Progress Notes (Signed)
Virtual Visit via Telephone Note   This visit type was conducted due to national recommendations for restrictions regarding the COVID-19 Pandemic (e.g. social distancing) in an effort to limit this patient's exposure and mitigate transmission in our community.  Due to her co-morbid illnesses, this patient is at least at moderate risk for complications without adequate follow up.  This format is felt to be most appropriate for this patient at this time.  The patient did not have access to video technology/had technical difficulties with video requiring transitioning to audio format only (telephone).  All issues noted in this document were discussed and addressed.  No physical exam could be performed with this format.    Evaluation Performed:  Follow-up visit  Date:  01/23/2019   ID:  [name], DOB 1980/10/16, MRN [id number]  Patient Location: Home Provider Location: Office  Location of Patient: Home Location of Provider: Telehealth Consent was obtain for visit to be over via telehealth. I verified that I am speaking with the correct person using two identifiers.  PCP:  Perlie Mayo, NP   Chief Complaint:    History of Present Illness:    Meghan Carter is a 38 y.o. female with history of anxiety, depression, fibroids, HSV, recent allergy/itching/uncontrolled anxiety.  Reports back today for medical management and follow-up on her itching.  Reports that she is finished her semester with school had very good grades with a high GPA.  Still aiming to go to law school and is very excited for this.  Reports that she has not had any itching but to start her prednisone that she was previously on from the emergency room visit.  Reports that she has been taking her doxepin and Zyrtec during the day and is wondering why she is so sleepy most likely related to those but she was not sure and wanted to ask.  Reports that she has some anxiety and is overall doing much better now that school is  over.  But she is still very upset about her stepson who was killed as previously mentioned in the last visit.  She reports that she is going to start working out and taking better care of herself and get a better plan on board for next semester when stress starts happening again.  Overall she is much happier in a better place today and is very thankful for feeling much better and having relief from the itching.  The patient does not have symptoms concerning for COVID-19 infection (fever, chills, cough, or new shortness of breath).   Past Medical, Surgical, Social History, Allergies, and Medications have been Reviewed.  Past Medical History:  Diagnosis Date  . Anemia   . Anxiety   . Atherosclerosis of arteries   . Blood transfusion without reported diagnosis 2017   2 units, no reaction  . Depression   . Dysmenorrhea   . Endometrial polyp 02/11/2014  . Fecal occult blood test positive 01/27/2014  . Fibroid   . History of abnormal cervical Pap smear 02/11/2014  . HSV (herpes simplex virus) infection   . Hydrosalpinx    Bilateral  . Internal hemorrhoids   . Menorrhagia with regular cycle 01/27/2014  . Pelvic pain in female 01/27/2014  . Rectal bleeding 01/27/2014  . Vaginal Pap smear, abnormal    Past Surgical History:  Procedure Laterality Date  . CHROMOPERTUBATION  08/14/2017   Procedure: CHROMOPERTUBATION;  Surgeon: Governor Specking, MD;  Location: [facility];  Service: Gynecology;;  . COLONOSCOPY  N/A 03/23/2014   SLF: 1. The examined terminal ileum appeared to be normal. 2. The left colon is redundant 3. Moderate sized internal hemorrhoids  . ECTOPIC PREGNANCY SURGERY Left 2005  . FLEXIBLE SIGMOIDOSCOPY N/A 04/01/2015   SLF: rectal bleeding due to large internal hemorrhoids 2. anemia due to rectal bleeding/memses/ poor iron intake.  Marland Kitchen FLEXIBLE SIGMOIDOSCOPY N/A 03/27/2015   Procedure: FLEXIBLE SIGMOIDOSCOPY;  Surgeon: Danie Binder, MD;  [facility];  Service: Endoscopy;   Laterality: N/A;  . HEMORRHOID BANDING N/A 03/23/2014   Procedure: HEMORRHOID BANDING;  Surgeon: Danie Binder, MD;  [facility];  Service: Endoscopy;  Laterality: N/A;  . HEMORRHOID BANDING  03/27/2015   Procedure: HEMORRHOID BANDING;  Surgeon: Danie Binder, MD;  [facility];  Service: Endoscopy;;  . HEMORRHOID SURGERY N/A 01/11/2018   Procedure: EXTENSIVE HEMORRHOIDECTOMY;  Surgeon: Aviva Signs, MD;  Location: [facility];  Service: General;  Laterality: N/A;  . HYSTEROSCOPY WITH D & C N/A 06/16/2014   Procedure: DILATATION AND CURETTAGE /HYSTEROSCOPY;  Surgeon: Jonnie Kind, MD;  Location: [facility];  Service: Gynecology;  Laterality: N/A;  . LAPAROSCOPIC UNILATERAL SALPINGECTOMY Right 06/16/2014   Procedure: LAPAROSCOPIC UNILATERAL SALPINGECTOMY;  Surgeon: Jonnie Kind, MD;  [facility]: [facility];  Service: Gynecology;  Laterality: Right;  . LAPAROSCOPY N/A 08/14/2017   Procedure: OPERATIVE LAPAROSCOPY ,LEFT SALPINGECTOMY, LYSIS OF ADHESIONS;  Surgeon: Governor Specking, MD;  Location: [facility];  Service: Gynecology;  Laterality: N/A;  . POLYPECTOMY N/A 06/16/2014   Procedure: POLYPECTOMY (endometrial);  Surgeon: Jonnie Kind, MD;  Location: [facility];  Service: Gynecology;  Laterality: N/A;     Current Meds  Medication Sig  . azithromycin (ZITHROMAX) 250 MG tablet Take 1 tablet (250 mg total) by mouth daily.  . busPIRone (BUSPAR) 5 MG tablet Take 1 tablet (5 mg total) by mouth 3 (three) times daily as needed. (Patient taking differently: Take 5 mg by mouth 3 (three) times daily as needed (for anxiety). )  . cetirizine (ZYRTEC) 10 MG tablet Take 1 tablet (10 mg total) by mouth daily.  Marland Kitchen doxepin (SINEQUAN) 10 MG capsule Take 1 capsule (10 mg total) by mouth 3 (three) times daily as needed (itching).  Marland Kitchen escitalopram (LEXAPRO) 10 MG tablet Take 1 tablet (10 mg total) by mouth daily.  . metroNIDAZOLE (FLAGYL) 500 MG tablet Take 1 tablet (500 mg total) by mouth 2 (two) times daily.   . valACYclovir (VALTREX) 1000 MG tablet Take 1 tablet (1,000 mg total) by mouth daily.     Allergies:   Clindamycin/lincomycin and Doxycycline hyclate   ROS:   Please see the history of present illness.    All other systems reviewed and are negative.   Labs/Other Tests and Data Reviewed:    Recent Labs: 01/09/2019: ALT 13; BUN 12; Creatinine, Ser 0.73; Hemoglobin 13.9; Platelets 248; Potassium 3.7; Sodium 133   Recent Lipid Panel No results found for: CHOL, TRIG, HDL, CHOLHDL, LDLCALC, LDLDIRECT  Wt Readings from Last 3 Encounters:  01/09/19 205 lb (93 kg)  12/25/18 205 lb (93 kg)  12/06/18 208 lb (94.3 kg)     Objective:    Vital Signs:  There were no vitals taken for this visit.   GEN:  Alert and oriented RESPIRATORY:  No shortness of breath noted in conversation PSYCH:  Normal mood, affect good communication  ASSESSMENT & PLAN:   Please see problem list for assessment and plan of the following diagnoses.  1. Anxiety  2. Itching  3. Recurrent major depressive disorder, in partial remission ([place])   Time:   Today, I have spent 10 minutes with the patient with telehealth technology discussing the above problems.     Medication Adjustments/Labs and Tests Ordered: Current medicines are reviewed at length with the patient today.  Concerns regarding medicines are outlined above.   Tests Ordered: No orders of the defined types were placed in this encounter.   Medication Changes: No orders of the defined types were placed in this encounter.   Disposition:  Follow up 03/05/2019  Signed, Perlie Mayo, NP  01/23/2019 3:46 PM     [facility]

## 2019-01-23 NOTE — Assessment & Plan Note (Signed)
Anxiety appears to be better controlled at this time.  Educated on things that she could do to promote self-care and need to get a plan in place before the next school semester starts. Provided extensive education on medication  Reviewed side effects, risks and benefits of medication.  Patient acknowledged agreement and understanding of the plan.  Lexapro daily, buspar as needed

## 2019-01-23 NOTE — Assessment & Plan Note (Signed)
Has been taking medication as directed. She is in a better place this visit. School semester ended.  Encouraged to get a plan for coping with stress once the new school year started.  Continue Lexapro and BuSpar as needed. Reviewed side effects, risks and benefits of medication.   Patient acknowledged agreement and understanding of the plan.

## 2019-01-23 NOTE — Assessment & Plan Note (Signed)
Resolved, denies having any itching recently.  Has just after prednisone is concerned that it will happen again.  Provided with extensive education on the causes of possible itching as directed last time.  Possible stress related factor.  Now that she is in a better place this might not happen.  But it could happen again if it is stress related when the school year starts.  She is receptive and understanding of this.  And agrees to getting a plan in place to help with anxiety

## 2019-02-21 ENCOUNTER — Telehealth: Payer: PRIVATE HEALTH INSURANCE | Admitting: Family Medicine

## 2019-02-27 ENCOUNTER — Other Ambulatory Visit: Payer: Self-pay

## 2019-02-27 ENCOUNTER — Telehealth: Payer: Self-pay | Admitting: Family Medicine

## 2019-03-05 ENCOUNTER — Telehealth: Payer: PRIVATE HEALTH INSURANCE | Admitting: Family Medicine

## 2019-07-02 ENCOUNTER — Ambulatory Visit
Admission: EM | Admit: 2019-07-02 | Discharge: 2019-07-02 | Disposition: A | Discharge status: Home / Self Care | Payer: 59 | Attending: Emergency Medicine | Admitting: Emergency Medicine

## 2019-07-02 ENCOUNTER — Other Ambulatory Visit: Payer: Self-pay

## 2019-07-02 ENCOUNTER — Encounter: Payer: Self-pay | Admitting: Emergency Medicine

## 2019-07-02 DIAGNOSIS — N760 Acute vaginitis: Secondary | ICD-10-CM | POA: Diagnosis present

## 2019-07-02 DIAGNOSIS — B9689 Other specified bacterial agents as the cause of diseases classified elsewhere: Secondary | ICD-10-CM | POA: Diagnosis present

## 2019-07-02 DIAGNOSIS — J302 Other seasonal allergic rhinitis: Secondary | ICD-10-CM

## 2019-07-02 DIAGNOSIS — Z113 Encounter for screening for infections with a predominantly sexual mode of transmission: Secondary | ICD-10-CM

## 2019-07-02 MED ORDER — PREDNISONE 10 MG PO TABS
20.0000 mg | ORAL_TABLET | Freq: Every day | ORAL | 0 refills | Status: DC
Start: 2019-07-02 — End: 2019-07-21

## 2019-07-02 MED ORDER — BENZONATATE 100 MG PO CAPS
100.0000 mg | ORAL_CAPSULE | Freq: Three times a day (TID) | ORAL | 0 refills | Status: DC
Start: 2019-07-02 — End: 2019-07-21

## 2019-07-02 MED ORDER — CETIRIZINE HCL 10 MG PO TABS: 10.0000 mg | ORAL_TABLET | Freq: Every day | ORAL | 0 refills | Status: DC

## 2019-07-02 MED ORDER — METRONIDAZOLE 500 MG PO TABS: 500.0000 mg | ORAL_TABLET | Freq: Two times a day (BID) | ORAL | 0 refills | Status: DC

## 2019-07-02 NOTE — ED Provider Notes (Signed)
[place]   [id number] 07/02/19 Arrival Time: 0829   [id number] DISCHARGE/ COUGH  SUBJECTIVE:  Meghan Carter is a 39 y.o. female who presents with complaints of vaginal discharge and odor for the past few days.  She denies a precipitating event, recent sexual encounter or recent antibiotic use.  Reported her last sexual encounter was in October.  Describes discharge as thin white.  Has not tried any OTC medication.  Denies alleviating or worsening factors.  She reports similar symptoms in the past and was diagnosed with bacterial vaginosis and treated accordingly.  She denies fever, chills, nausea, vomiting, abdominal or pelvic pain, urinary symptoms, vaginal itching, vaginal bleeding, dyspareunia, vaginal rashes or lesions.   She is also complaining of cough, nasal congestion and runny nose for the past 1 week.  Denies any precipitating event or exposure to flu or strep.  Has tried OTC medication without relief.  Symptom is worse by being outdoor.  Denies similar symptoms in the past.  Denies chills, fever, shortness of breath.  Compliant with BC:  ROS: As per HPI.  All other pertinent ROS negative.     Past Medical History:  Diagnosis Date  . Acute blood loss anemia 03/26/2015  . Anemia   . Anxiety   . Atherosclerosis of arteries   . Blood transfusion without reported diagnosis 2017   2 units, no reaction  . Callus of foot 02/08/2011  . Depression   . Dysmenorrhea   . Endometrial polyp 02/11/2014  . Fecal occult blood test positive 01/27/2014  . Fecal occult blood test positive 01/27/2014  . Fibroid   . High-tone pelvic floor dysfunction 03/17/2015  . History of abnormal cervical Pap smear 02/11/2014  . History of acute pancreatitis 03/31/2011  . HSV (herpes simplex virus) infection   . Hydrosalpinx    Bilateral  . Internal hemorrhoids   . 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 Lower abdominal pain   . Menorrhagia with regular cycle 01/27/2014  .  Menorrhagia with regular cycle 01/27/2014  . Other fatigue   . Pelvic pain in female 01/27/2014  . Plantar fasciitis 02/08/2011  . Rectal bleeding 01/27/2014  . Rectal pain 03/19/2014  . Severe anemia   . Vaginal Pap smear, abnormal    Past Surgical History:  Procedure Laterality Date  . CHROMOPERTUBATION  08/14/2017   Procedure: CHROMOPERTUBATION;  Surgeon: Governor Specking, MD;  Location: [facility];  Service: Gynecology;;  . COLONOSCOPY N/A 03/23/2014   SLF: 1. The examined terminal ileum appeared to be normal. 2. The left colon is redundant 3. Moderate sized internal hemorrhoids  . ECTOPIC PREGNANCY SURGERY Left 2005  . FLEXIBLE SIGMOIDOSCOPY N/A 04/01/2015   SLF: rectal bleeding due to large internal hemorrhoids 2. anemia due to rectal bleeding/memses/ poor iron intake.  Marland Kitchen FLEXIBLE SIGMOIDOSCOPY N/A 03/27/2015   Procedure: FLEXIBLE SIGMOIDOSCOPY;  Surgeon: Danie Binder, MD;  Location: [facility];  Service: Endoscopy;  Laterality: N/A;  . HEMORRHOID BANDING N/A 03/23/2014   Procedure: HEMORRHOID BANDING;  Surgeon: Danie Binder, MD;  Location: [facility];  Service: Endoscopy;  Laterality: N/A;  . HEMORRHOID BANDING  03/27/2015   Procedure: HEMORRHOID BANDING;  Surgeon: Danie Binder, MD;  Location: [facility];  Service: Endoscopy;;  . HEMORRHOID SURGERY N/A 01/11/2018   Procedure: EXTENSIVE HEMORRHOIDECTOMY;  Surgeon: Aviva Signs, MD;  Location: [facility];  Service: General;  Laterality: N/A;  . HYSTEROSCOPY WITH D & C N/A 06/16/2014   Procedure: DILATATION AND CURETTAGE /HYSTEROSCOPY;  Surgeon: Jonnie Kind, MD;  Location: [facility];  Service: Gynecology;  Laterality: N/A;  . LAPAROSCOPIC UNILATERAL SALPINGECTOMY Right 06/16/2014   Procedure: LAPAROSCOPIC UNILATERAL SALPINGECTOMY;  Surgeon: Jonnie Kind, MD;  Location: [facility];  Service: Gynecology;  Laterality: Right;  . LAPAROSCOPY N/A 08/14/2017   Procedure: OPERATIVE LAPAROSCOPY ,LEFT SALPINGECTOMY, LYSIS OF ADHESIONS;  Surgeon:  Governor Specking, MD;  Location: [facility];  Service: Gynecology;  Laterality: N/A;  . POLYPECTOMY N/A 06/16/2014   Procedure: POLYPECTOMY (endometrial);  Surgeon: Jonnie Kind, MD;  Location: [facility];  Service: Gynecology;  Laterality: N/A;   Allergies  Allergen Reactions  . Clindamycin/Lincomycin Itching  . Doxycycline Hyclate Rash   No current facility-administered medications on file prior to encounter.   Current Outpatient Medications on File Prior to Encounter  Medication Sig Dispense Refill  . busPIRone (BUSPAR) 5 MG tablet Take 1 tablet (5 mg total) by mouth 3 (three) times daily as needed. (Patient taking differently: Take 5 mg by mouth 3 (three) times daily as needed (for anxiety). ) 90 tablet 1  . doxepin (SINEQUAN) 10 MG capsule Take 1 capsule (10 mg total) by mouth 3 (three) times daily as needed (itching). 30 capsule 0  . escitalopram (LEXAPRO) 10 MG tablet Take 1 tablet (10 mg total) by mouth daily. 30 tablet 3  . valACYclovir (VALTREX) 1000 MG tablet Take 1 tablet (1,000 mg total) by mouth daily. 30 tablet 11    Social History   Socioeconomic History  . Marital status: Legally Separated    Spouse name: Not on file  . Number of children: Not on file  . Years of education: Not on file  . Highest education level: Not on file  Occupational History  . Not on file  Tobacco Use  . Smoking status: Light Tobacco Smoker    Packs/day: 0.25    Years: 27.00    Pack years: 6.75    Types: Cigarettes  . Smokeless tobacco: Never Used  Substance and Sexual Activity  . Alcohol use: Yes    Comment: "very little"  . Drug use: No  . Sexual activity: Yes    Partners: Male    Birth control/protection: None  Other Topics Concern  . Not on file  Social History Narrative  . Not on file   Social Determinants of Health   Financial Resource Strain:   . Difficulty of Paying Living Expenses:   Food Insecurity:   . Worried About Charity fundraiser in the Last Year:   . Academic librarian in the Last Year:   Transportation Needs:   . Film/video editor (Medical):   Marland Kitchen Lack of Transportation (Non-Medical):   Physical Activity:   . Days of Exercise per Week:   . Minutes of Exercise per Session:   Stress:   . Feeling of Stress :   Social Connections:   . Frequency of Communication with Friends and Family:   . Frequency of Social Gatherings with Friends and Family:   . Attends Religious Services:   . Active Member of Clubs or Organizations:   . Attends Archivist Meetings:   Marland Kitchen Marital Status:   Intimate Partner Violence:   . Fear of Current or Ex-Partner:   . Emotionally Abused:   Marland Kitchen Physically Abused:   . Sexually Abused:    Family History  Problem Relation Age of Onset  . Diabetes Father   . Diabetes Paternal Grandmother   . Diabetes Paternal Grandfather  OBJECTIVE:  Vitals:   07/02/19 0852  BP: (!) 114/56  Pulse: 98  Resp: 18  Temp: 98.8 F (37.1 C)  TempSrc: Oral  SpO2: 97%     General appearance: Alert, NAD, appears stated age Head: NCAT Throat: lips, mucosa, and tongue normal; teeth and gums normal Lungs: CTA bilaterally without adventitious breath sounds Heart: regular rate and rhythm.  Radial pulses 2+ symmetrical bilaterally Back: no CVA tenderness Abdomen: soft, non-tender; bowel sounds normal; no masses or organomegaly; no guarding or rebound tenderness GU: Vaginal self swab was obtained  skin: warm and dry Psychological:  Alert and cooperative. Normal mood and affect.  LABS:  Results for orders placed or performed during the hospital encounter of 01/09/19  Wet prep, genital  Result Value Ref Range   Yeast Wet Prep HPF POC NONE SEEN NONE SEEN   Trich, Wet Prep NONE SEEN NONE SEEN   Clue Cells Wet Prep HPF POC NONE SEEN NONE SEEN   WBC, Wet Prep HPF POC MODERATE (A) NONE SEEN   Sperm NONE SEEN   Comprehensive metabolic panel  Result Value Ref Range   Sodium 133 (L) 135 - 145 mmol/L   Potassium 3.7 3.5 - 5.1  mmol/L   Chloride 105 98 - 111 mmol/L   CO2 22 22 - 32 mmol/L   Glucose, Bld 97 70 - 99 mg/dL   BUN 12 6 - 20 mg/dL   Creatinine, Ser 0.73 0.44 - 1.00 mg/dL   Calcium 9.0 8.9 - 10.3 mg/dL   Total Protein 7.3 6.5 - 8.1 g/dL   Albumin 3.9 3.5 - 5.0 g/dL   AST 15 15 - 41 U/L   ALT 13 0 - 44 U/L   Alkaline Phosphatase 63 38 - 126 U/L   Total Bilirubin 0.5 0.3 - 1.2 mg/dL   GFR calc non Af Amer >60 >60 mL/min   GFR calc Af Amer >60 >60 mL/min   Anion gap 6 5 - 15  CBC with Differential  Result Value Ref Range   WBC 8.3 4.0 - 10.5 K/uL   RBC 4.96 3.87 - 5.11 MIL/uL   Hemoglobin 13.9 12.0 - 15.0 g/dL   HCT 43.4 36.0 - 46.0 %   MCV 87.5 80.0 - 100.0 fL   MCH 28.0 26.0 - 34.0 pg   MCHC 32.0 30.0 - 36.0 g/dL   RDW 13.3 11.5 - 15.5 %   Platelets 248 150 - 400 K/uL   nRBC 0.0 0.0 - 0.2 %   Neutrophils Relative % 36 %   Neutro Abs 3.0 1.7 - 7.7 K/uL   Lymphocytes Relative 36 %   Lymphs Abs 3.0 0.7 - 4.0 K/uL   Monocytes Relative 8 %   Monocytes Absolute 0.7 0.1 - 1.0 K/uL   Eosinophils Relative 20 %   Eosinophils Absolute 1.6 (H) 0.0 - 0.5 K/uL   Basophils Relative 0 %   Basophils Absolute 0.0 0.0 - 0.1 K/uL   Immature Granulocytes 0 %   Abs Immature Granulocytes 0.03 0.00 - 0.07 K/uL  Urinalysis, Routine w reflex microscopic  Result Value Ref Range   Color, Urine YELLOW YELLOW   APPearance HAZY (A) CLEAR   Specific Gravity, Urine 1.024 1.005 - 1.030   pH 5.0 5.0 - 8.0   Glucose, UA NEGATIVE NEGATIVE mg/dL   Hgb urine dipstick NEGATIVE NEGATIVE   Bilirubin Urine NEGATIVE NEGATIVE   Ketones, ur NEGATIVE NEGATIVE mg/dL   Protein, ur NEGATIVE NEGATIVE mg/dL   Nitrite NEGATIVE NEGATIVE   Leukocytes,Ua  NEGATIVE NEGATIVE  RPR  Result Value Ref Range   RPR Ser Ql NON REACTIVE NON REACTIVE  HIV Antibody (routine testing w rflx)  Result Value Ref Range   HIV Screen 4th Generation wRfx NON REACTIVE NON REACTIVE  Lipase, blood  Result Value Ref Range   Lipase 42 11 - 51 U/L    GC/Chlamydia probe amp  Result Value Ref Range   Chlamydia Negative    Neisseria Gonorrhea Negative     Labs Reviewed  CERVICOVAGINAL ANCILLARY ONLY    ASSESSMENT & PLAN:  1. Screening for STD (sexually transmitted disease)   2. BV (bacterial vaginosis)   3. Seasonal allergies     Meds ordered this encounter  Medications  . metroNIDAZOLE (FLAGYL) 500 MG tablet    Sig: Take 1 tablet (500 mg total) by mouth 2 (two) times daily.    Dispense:  14 tablet    Refill:  0  . benzonatate (TESSALON) 100 MG capsule    Sig: Take 1 capsule (100 mg total) by mouth every 8 (eight) hours.    Dispense:  30 capsule    Refill:  0  . cetirizine (ZYRTEC ALLERGY) 10 MG tablet    Sig: Take 1 tablet (10 mg total) by mouth daily.    Dispense:  30 tablet    Refill:  0  . predniSONE (DELTASONE) 10 MG tablet    Sig: Take 2 tablets (20 mg total) by mouth daily.    Dispense:  15 tablet    Refill:  0    Pending: Labs Reviewed  CERVICOVAGINAL ANCILLARY ONLY    Discharge instructions Vaginal self swab was obtained/we will call you with abnormal result Prescribed metronidazole 500 mg twice daily for 7 days (do not take while consuming alcohol and/or if breastfeeding) Prescribed Tessalon, Zyrtec and prednisone for seasonal allergy Take medications as prescribed and to completion If tests results are positive, please abstain from sexual activity until you and your partner(s) have been treated Follow up with PCP or [facility] if symptoms persists Return here or go to ER if you have any new or worsening symptoms fever, chills, nausea, vomiting, abdominal or pelvic pain, painful intercourse, vaginal discharge, vaginal bleeding, persistent symptoms despite treatment, etc...  Reviewed expectations re: course of current medical issues. Questions answered. Outlined signs and symptoms indicating need for more acute intervention. Patient verbalized understanding. After Visit Summary given.        Emerson Monte, [place] 07/02/19 [id number]

## 2019-07-02 NOTE — ED Triage Notes (Signed)
Pt sts cough and nasal congestion x weeks; pt sts vaginal discharge with odor and requesting STD testing; pt sts last sexually active last November

## 2019-07-02 NOTE — Discharge Instructions (Addendum)
Vaginal self swab was obtained/we will call you with abnormal result Prescribed metronidazole 500 mg twice daily for 7 days (do not take while consuming alcohol and/or if breastfeeding) Prescribed Tessalon, Zyrtec and prednisone for seasonal allergy Take medications as prescribed and to completion If tests results are positive, please abstain from sexual activity until you and your partner(s) have been treated Follow up with PCP or [facility] if symptoms persists Return here or go to ER if you have any new or worsening symptoms fever, chills, nausea, vomiting, abdominal or pelvic pain, painful intercourse, vaginal discharge, vaginal bleeding, persistent symptoms despite treatment, etc..Marland Kitchen

## 2019-07-03 LAB — RPR: RPR Ser Ql: NONREACTIVE

## 2019-07-03 LAB — HIV ANTIBODY (ROUTINE TESTING W REFLEX): HIV Screen 4th Generation wRfx: NONREACTIVE

## 2019-07-04 LAB — CERVICOVAGINAL ANCILLARY ONLY
Bacterial Vaginitis (gardnerella): NEGATIVE
Candida Glabrata: NEGATIVE
Candida Vaginitis: NEGATIVE
Chlamydia: NEGATIVE
Comment: NEGATIVE
Comment: NEGATIVE
Comment: NEGATIVE
Comment: NEGATIVE
Comment: NEGATIVE
Comment: NORMAL
Neisseria Gonorrhea: NEGATIVE
Trichomonas: NEGATIVE

## 2019-07-21 ENCOUNTER — Telehealth (INDEPENDENT_AMBULATORY_CARE_PROVIDER_SITE_OTHER): Payer: 59 | Admitting: Primary Care

## 2019-07-21 ENCOUNTER — Other Ambulatory Visit: Payer: Self-pay

## 2019-07-21 ENCOUNTER — Encounter (INDEPENDENT_AMBULATORY_CARE_PROVIDER_SITE_OTHER): Payer: Self-pay | Admitting: Primary Care

## 2019-07-21 DIAGNOSIS — Z008 Encounter for other general examination: Secondary | ICD-10-CM

## 2019-07-21 NOTE — Progress Notes (Addendum)
Virtual Visit via Telephone Note  I connected with Meghan Carter on 07/21/19 at  3:30 PM EDT by telephone and verified that I am speaking with the correct person using two identifiers.   I discussed the limitations, risks, security and privacy concerns of performing an evaluation and management service by telephone and the availability of in person appointments. I also discussed with the patient that there may be a patient responsible charge related to this service. The patient expressed understanding and agreed to proceed. Patient location unknown Meghan Mire, NP was at [facility]    History of Present Illness: [name] is a 39 year old female who is having a virtual visit to establish care and will follow up with a physical and labs.  Past Medical History:  Diagnosis Date  . Acute blood loss anemia 03/26/2015  . Anemia   . Anxiety   . Atherosclerosis of arteries   . Blood transfusion without reported diagnosis 2017   2 units, no reaction  . Callus of foot 02/08/2011  . Depression   . Dysmenorrhea   . Endometrial polyp 02/11/2014  . Fecal occult blood test positive 01/27/2014  . Fecal occult blood test positive 01/27/2014  . Fibroid   . High-tone pelvic floor dysfunction 03/17/2015  . History of abnormal cervical Pap smear 02/11/2014  . History of acute pancreatitis 03/31/2011  . HSV (herpes simplex virus) infection   . Hydrosalpinx    Bilateral  . Internal hemorrhoids   . 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 Lower abdominal pain   . Menorrhagia with regular cycle 01/27/2014  . Menorrhagia with regular cycle 01/27/2014  . Other fatigue   . Pelvic pain in female 01/27/2014  . Plantar fasciitis 02/08/2011  . Rectal bleeding 01/27/2014  . Rectal pain 03/19/2014  . Severe anemia   . Vaginal Pap smear, abnormal    Current Outpatient Medications on File Prior to Visit  Medication Sig Dispense Refill  . busPIRone (BUSPAR) 5 MG  tablet Take 1 tablet (5 mg total) by mouth 3 (three) times daily as needed. (Patient not taking: Reported on 07/21/2019) 90 tablet 1  . cetirizine (ZYRTEC ALLERGY) 10 MG tablet Take 1 tablet (10 mg total) by mouth daily. (Patient not taking: Reported on 07/21/2019) 30 tablet 0  . doxepin (SINEQUAN) 10 MG capsule Take 1 capsule (10 mg total) by mouth 3 (three) times daily as needed (itching). (Patient not taking: Reported on 07/21/2019) 30 capsule 0  . escitalopram (LEXAPRO) 10 MG tablet Take 1 tablet (10 mg total) by mouth daily. (Patient not taking: Reported on 07/21/2019) 30 tablet 3  . valACYclovir (VALTREX) 1000 MG tablet Take 1 tablet (1,000 mg total) by mouth daily. (Patient not taking: Reported on 07/21/2019) 30 tablet 11   No current facility-administered medications on file prior to visit.    Observations/Objective:   Assessment and Plan:   Follow Up Instructions:    I discussed the assessment and treatment plan with the patient. The patient was provided an opportunity to ask questions and all were answered. The patient agreed with the plan and demonstrated an understanding of the instructions.   The patient was advised to call back or seek an in-person evaluation if the symptoms worsen or if the condition fails to improve as anticipated.  I provided 18 minutes of non-face-to-face time during this encounter. Includes chart review, labs and imaging    Meghan Perna, NP

## 2019-07-21 NOTE — Progress Notes (Signed)
Pt has several concerns  She states she has pains in her head at times Pain in lower abdomen, knees, and feet She complains of acne  Pt believes she is allergic to PREDNISONE  Believes METRONIDAZOLE is rejecting in her body

## 2020-04-30 ENCOUNTER — Other Ambulatory Visit: Payer: Self-pay

## 2020-04-30 ENCOUNTER — Ambulatory Visit
Admission: EM | Admit: 2020-04-30 | Discharge: 2020-04-30 | Disposition: A | Discharge status: Home / Self Care | Payer: 59 | Attending: Internal Medicine | Admitting: Internal Medicine

## 2020-04-30 ENCOUNTER — Encounter: Payer: Self-pay | Admitting: Emergency Medicine

## 2020-04-30 DIAGNOSIS — R5383 Other fatigue: Secondary | ICD-10-CM

## 2020-04-30 DIAGNOSIS — N761 Subacute and chronic vaginitis: Secondary | ICD-10-CM

## 2020-04-30 LAB — POCT URINALYSIS DIP (MANUAL ENTRY)
Bilirubin, UA: NEGATIVE
Glucose, UA: NEGATIVE mg/dL
Ketones, POC UA: NEGATIVE mg/dL
Leukocytes, UA: NEGATIVE
Nitrite, UA: NEGATIVE
Protein Ur, POC: NEGATIVE mg/dL
Spec Grav, UA: 1.01 (ref 1.010–1.025)
Urobilinogen, UA: 0.2 E.U./dL
pH, UA: 5.5 (ref 5.0–8.0)

## 2020-04-30 MED ORDER — FLUTICASONE PROPIONATE 50 MCG/ACT NA SUSP
1.0000 | Freq: Every day | NASAL | 0 refills | Status: DC
Start: 2020-04-30 — End: 2020-06-29

## 2020-04-30 MED ORDER — BORIC ACID VAGINAL 600 MG VA SUPP: 600.0000 mg | Freq: Every day | VAGINAL | 0 refills | Status: DC

## 2020-04-30 MED ORDER — CARBAMIDE PEROXIDE 6.5 % OT SOLN
5.0000 [drp] | Freq: Two times a day (BID) | OTIC | 0 refills | Status: DC
Start: 2020-04-30 — End: 2020-06-29

## 2020-04-30 MED ORDER — LORATADINE 10 MG PO TABS: 10.0000 mg | ORAL_TABLET | Freq: Every day | ORAL | 1 refills | Status: DC

## 2020-04-30 MED ORDER — METRONIDAZOLE 500 MG PO TABS
500.0000 mg | ORAL_TABLET | Freq: Two times a day (BID) | ORAL | 0 refills | Status: DC
Start: 2020-04-30 — End: 2020-06-29

## 2020-04-30 NOTE — ED Triage Notes (Signed)
History of taking amoxillicin back in January.  She has noticed a fishy vaginal order since then.  Now is having pain in genital area.  States she was being treated for headache, watery eyes with drainage, ringing in ears with antibiotic and states these symptoms still persists.

## 2020-04-30 NOTE — Discharge Instructions (Addendum)
Please take medications as prescribed We will call you with labs if abnormal Please sign up for MyChart so you can review your labs.

## 2020-04-30 NOTE — ED Provider Notes (Signed)
[facility]    CSN: [id number] Arrival date & time: 04/30/20  2836      History   Chief Complaint No chief complaint on file.   HPI Paradise LORRIN BODNER is a 40 y.o. female comes to the urgent care with complaints of vaginal odor of a few months duration.  Patient symptoms started after she completed a course of amoxicillin.  She denies any vaginal discharge, dysuria urgency or frequency.  No fever or chills.  No dyspareunia-deep or superficial.  No lower abdominal pain.   Patient also complains about generalized fatigue of several weeks duration.  She has body aches.  No dizziness or near syncopal episodes.  No fever or chills.  No cold or warm intolerance.  No weight changes.  Appetite is preserved.  Patient has depressed mood but denies any suicidal or homicidal ideation.  HPI  Past Medical History:  Diagnosis Date  . Acute blood loss anemia 03/26/2015  . Anemia   . Anxiety   . Atherosclerosis of arteries   . Blood transfusion without reported diagnosis 2017   2 units, no reaction  . Callus of foot 02/08/2011  . Depression   . Dysmenorrhea   . Endometrial polyp 02/11/2014  . Fecal occult blood test positive 01/27/2014  . Fecal occult blood test positive 01/27/2014  . Fibroid   . High-tone pelvic floor dysfunction 03/17/2015  . History of abnormal cervical Pap smear 02/11/2014  . History of acute pancreatitis 03/31/2011  . HSV (herpes simplex virus) infection   . Hydrosalpinx    Bilateral  . Internal hemorrhoids   . 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 Lower abdominal pain   . Menorrhagia with regular cycle 01/27/2014  . Menorrhagia with regular cycle 01/27/2014  . Other fatigue   . Pelvic pain in female 01/27/2014  . Plantar fasciitis 02/08/2011  . Rectal bleeding 01/27/2014  . Rectal pain 03/19/2014  . Severe anemia   . Vaginal Pap smear, abnormal     Patient Active Problem List   Diagnosis Date Noted  . Itching 01/23/2019  . Medication  management 01/23/2019  . Recurrent major depressive disorder, in partial remission ([place]) 01/14/2019  . Depression, major, single episode, moderate ([place]) 11/19/2018  . Anxiety 11/19/2018  . 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 Internal and external bleeding hemorrhoids   . Dyspareunia due to medical condition in female 03/17/2015  . History of abnormal cervical Pap smear 02/11/2014  . Hemorrhoids, internal 01/27/2014  . Nondependent alcohol abuse, episodic drinking behavior 03/31/2011    Past Surgical History:  Procedure Laterality Date  . CHROMOPERTUBATION  08/14/2017   Procedure: CHROMOPERTUBATION;  Surgeon: Governor Specking, MD;  Location: [facility];  Service: Gynecology;;  . COLONOSCOPY N/A 03/23/2014   SLF: 1. The examined terminal ileum appeared to be normal. 2. The left colon is redundant 3. Moderate sized internal hemorrhoids  . ECTOPIC PREGNANCY SURGERY Left 2005  . FLEXIBLE SIGMOIDOSCOPY N/A 04/01/2015   SLF: rectal bleeding due to large internal hemorrhoids 2. anemia due to rectal bleeding/memses/ poor iron intake.  Marland Kitchen FLEXIBLE SIGMOIDOSCOPY N/A 03/27/2015   Procedure: FLEXIBLE SIGMOIDOSCOPY;  Surgeon: Danie Binder, MD;  Location: [facility];  Service: Endoscopy;  Laterality: N/A;  . HEMORRHOID BANDING N/A 03/23/2014   Procedure: HEMORRHOID BANDING;  Surgeon: Danie Binder, MD;  Location: [facility];  Service: Endoscopy;  Laterality: N/A;  . HEMORRHOID BANDING  03/27/2015   Procedure: HEMORRHOID BANDING;  Surgeon: Danie Binder, MD;  Location: [facility];  Service: Endoscopy;;  . HEMORRHOID SURGERY N/A 01/11/2018   Procedure: EXTENSIVE HEMORRHOIDECTOMY;  Surgeon: Aviva Signs, MD;  Location: [facility];  Service: General;  Laterality: N/A;  . HYSTEROSCOPY WITH D & C N/A 06/16/2014   Procedure: DILATATION AND CURETTAGE /HYSTEROSCOPY;  Surgeon: Jonnie Kind, MD;  Location: [facility];  Service: Gynecology;  Laterality: N/A;  . LAPAROSCOPIC UNILATERAL SALPINGECTOMY Right  06/16/2014   Procedure: LAPAROSCOPIC UNILATERAL SALPINGECTOMY;  Surgeon: Jonnie Kind, MD;  Location: [facility];  Service: Gynecology;  Laterality: Right;  . LAPAROSCOPY N/A 08/14/2017   Procedure: OPERATIVE LAPAROSCOPY ,LEFT SALPINGECTOMY, LYSIS OF ADHESIONS;  Surgeon: Governor Specking, MD;  Location: [facility];  Service: Gynecology;  Laterality: N/A;  . POLYPECTOMY N/A 06/16/2014   Procedure: POLYPECTOMY (endometrial);  Surgeon: Jonnie Kind, MD;  Location: [facility];  Service: Gynecology;  Laterality: N/A;    OB History    Gravida  1   Para      Term      Preterm      AB  1   Living        SAB      IAB      Ectopic  1   Multiple      Live Births               Home Medications    Prior to Admission medications   Medication Sig Start Date End Date Taking? Authorizing Provider  carbamide peroxide (DEBROX) 6.5 % OTIC solution Place 5 drops into both ears 2 (two) times daily. 04/30/20  Yes Tamella Tuccillo, Myrene Galas, MD  fluticasone (FLONASE) 50 MCG/ACT nasal spray Place 1 spray into both nostrils daily. 04/30/20  Yes [place] Moyd, Myrene Galas, MD  loratadine (CLARITIN) 10 MG tablet Take 1 tablet (10 mg total) by mouth daily. 04/30/20  Yes Rehman Levinson, Myrene Galas, MD  metroNIDAZOLE (FLAGYL) 500 MG tablet Take 1 tablet (500 mg total) by mouth 2 (two) times daily. 04/30/20  Yes [place], Myrene Galas, MD  Boric Acid Vaginal 600 MG SUPP Place 600 mg vaginally at bedtime. 04/30/20   Jemila Camille, Myrene Galas, MD  Vitamin D, Ergocalciferol, (DRISDOL) 1.25 MG (50000 UNIT) CAPS capsule Take 1 capsule (50,000 Units total) by mouth every 7 (seven) days for 6 doses. 05/03/20 06/08/20  Chase Picket, MD  cetirizine (ZYRTEC ALLERGY) 10 MG tablet Take 1 tablet (10 mg total) by mouth daily. Patient not taking: Reported on 07/21/2019 07/02/19 04/30/20  Emerson Monte, FNP  doxepin (SINEQUAN) 10 MG capsule Take 1 capsule (10 mg total) by mouth 3 (three) times daily as needed (itching). Patient not taking: Reported on 07/21/2019  53/9/76 7/34/19  Delora Fuel, MD  escitalopram (LEXAPRO) 10 MG tablet Take 1 tablet (10 mg total) by mouth daily. Patient not taking: Reported on 07/21/2019 11/19/18 04/30/20  Perlie Mayo, NP    Family History Family History  Problem Relation Age of Onset  . Diabetes Father   . Diabetes Paternal Grandmother   . Diabetes Paternal Grandfather     Social History Social History   Tobacco Use  . Smoking status: Light Tobacco Smoker    Packs/day: 0.25    Years: 27.00    Pack years: 6.75    Types: Cigarettes  . Smokeless tobacco: Never Used  Vaping Use  . Vaping Use: Never used  Substance Use Topics  . Alcohol use: Yes    Comment: "very little"  . Drug use: No     Allergies  Clindamycin/lincomycin, Amoxil [amoxicillin], and Doxycycline hyclate   Review of Systems Review of Systems  Constitutional: Positive for activity change and fatigue. Negative for appetite change and fever.  Respiratory: Negative.   Genitourinary: Negative for dysuria, frequency, pelvic pain and vaginal discharge.  Neurological: Negative for dizziness, light-headedness and headaches.     Physical Exam Triage Vital Signs ED Triage Vitals  Enc Vitals Group     BP 04/30/20 0811 130/80     Pulse Rate 04/30/20 0811 (!) 112     Resp 04/30/20 0811 18     Temp 04/30/20 0811 99.2 F (37.3 C)     Temp Source 04/30/20 0811 Oral     SpO2 04/30/20 0811 97 %     Weight --      Height --      Head Circumference --      Peak Flow --      Pain Score 04/30/20 0815 10     Pain Loc --      Pain Edu? --      Excl. in [place]? --    No data found.  Updated Vital Signs BP 130/80 (BP Location: Right Arm)   Pulse (!) 112   Temp 99.2 F (37.3 C) (Oral)   Resp 18   LMP 04/20/2020   SpO2 97%   Visual Acuity Right Eye Distance:   Left Eye Distance:   Bilateral Distance:    Right Eye Near:   Left Eye Near:    Bilateral Near:     Physical Exam Vitals and nursing note reviewed.  Constitutional:       General: She is not in acute distress.    Appearance: She is not ill-appearing.  HENT:     Mouth/Throat:     Mouth: Mucous membranes are moist.  Eyes:     Pupils: Pupils are equal, round, and reactive to light.  Neck:     Vascular: No carotid bruit.  Cardiovascular:     Rate and Rhythm: Normal rate and regular rhythm.     Pulses: Normal pulses.     Heart sounds: Normal heart sounds.  Pulmonary:     Effort: Pulmonary effort is normal.     Breath sounds: Normal breath sounds.  Abdominal:     General: Bowel sounds are normal.     Palpations: Abdomen is soft.  Musculoskeletal:     Cervical back: Normal range of motion. No rigidity or tenderness.  Lymphadenopathy:     Cervical: No cervical adenopathy.  Neurological:     Mental Status: She is alert.      UC Treatments / Results  Labs (all labs ordered are listed, but only abnormal results are displayed) Labs Reviewed  BASIC METABOLIC PANEL - Abnormal; Notable for the following components:      Result Value   Sodium 132 (*)    CO2 18 (*)    All other components within normal limits   Narrative:    Performed at:  [address], [place], [place]  [id number] Lab Director: Rush Farmer MD, Phone:  [phone]  VITAMIN D 25 HYDROXY (VIT D DEFICIENCY, FRACTURES) - Abnormal; Notable for the following components:   Vit D, 25-Hydroxy 10.7 (*)    All other components within normal limits   Narrative:    Performed at:  [address], [place], [place]  [id number] Lab Director: Rush Farmer MD, Phone:  [phone]  POCT URINALYSIS DIP (MANUAL ENTRY) - Abnormal; Notable for the  following components:   Blood, UA trace-intact (*)    All other components within normal limits  CBC WITH DIFFERENTIAL/PLATELET   Narrative:    Performed at:  [facility] [address] [place], [place]  [id number] Lab Director: Rush Farmer MD, Phone:  [phone]  TSH   Narrative:     Performed at:  [facility] [address] [place], [place]  [id number] Lab Director: Rush Farmer MD, Phone:  [phone]  CERVICOVAGINAL ANCILLARY ONLY    EKG   Radiology No results found.  Procedures Procedures (including critical care time)  Medications Ordered in UC Medications - No data to display  Initial Impression / Assessment and Plan / UC Course  I have reviewed the triage vital signs and the nursing notes.  Pertinent labs & imaging results that were available during my care of the patient were reviewed by me and considered in my medical decision making (see chart for details).     1.  Fatigue: CBC, BMP, vitamin D, TSH If labs are abnormal we will call patient and send recommendations.  2.  Chronic vaginitis: Cervicovaginal swab Flagyl 500 mg twice daily for 7 days Boric acid vaginal suppository 600 mg nightly for 2 to 3 weeks. If labs are abnormal patient will be called in medications will be updated.  3.  Seasonal allergies: Claritin Fluticasone I reviewed patient's previous encounters from 07/21/2019. Return precautions given. Final Clinical Impressions(s) / UC Diagnoses   Final diagnoses:  Other fatigue  Chronic vaginitis     Discharge Instructions     Please take medications as prescribed We will call you with labs if abnormal Please sign up for MyChart so you can review your labs.    ED Prescriptions    Medication Sig Dispense Auth. Provider   metroNIDAZOLE (FLAGYL) 500 MG tablet Take 1 tablet (500 mg total) by mouth 2 (two) times daily. 14 tablet Sheralee Qazi, Myrene Galas, MD   loratadine (CLARITIN) 10 MG tablet Take 1 tablet (10 mg total) by mouth daily. 30 tablet Nevaya Nagele, Myrene Galas, MD   carbamide peroxide (DEBROX) 6.5 % OTIC solution Place 5 drops into both ears 2 (two) times daily. 15 mL Geovonni Meyerhoff, Myrene Galas, MD   fluticasone (FLONASE) 50 MCG/ACT nasal spray Place 1 spray into both nostrils daily. 16 g Isiaah Cuervo, Myrene Galas, MD   Boric  Acid Vaginal 600 MG SUPP  (Status: Discontinued) Place 600 mg vaginally at bedtime. 14 suppository Aizen Duval, Myrene Galas, MD   Boric Acid Vaginal 600 MG SUPP Place 600 mg vaginally at bedtime. 14 suppository Corey Caulfield, Myrene Galas, MD     PDMP not reviewed this encounter.   Chase Picket, MD 05/13/20 [phone]

## 2020-05-01 LAB — CBC WITH DIFFERENTIAL/PLATELET
Basophils Absolute: 0.1 10*3/uL (ref 0.0–0.2)
Basos: 1 %
EOS (ABSOLUTE): 0.4 10*3/uL (ref 0.0–0.4)
Eos: 7 %
Hematocrit: 40.4 % (ref 34.0–46.6)
Hemoglobin: 13.6 g/dL (ref 11.1–15.9)
Immature Grans (Abs): 0 10*3/uL (ref 0.0–0.1)
Immature Granulocytes: 0 %
Lymphocytes Absolute: 2.1 10*3/uL (ref 0.7–3.1)
Lymphs: 41 %
MCH: 27.9 pg (ref 26.6–33.0)
MCHC: 33.7 g/dL (ref 31.5–35.7)
MCV: 83 fL (ref 79–97)
Monocytes Absolute: 0.6 10*3/uL (ref 0.1–0.9)
Monocytes: 12 %
Neutrophils Absolute: 2 10*3/uL (ref 1.4–7.0)
Neutrophils: 39 %
Platelets: 309 10*3/uL (ref 150–450)
RBC: 4.88 x10E6/uL (ref 3.77–5.28)
RDW: 12.9 % (ref 11.7–15.4)
WBC: 5.1 10*3/uL (ref 3.4–10.8)

## 2020-05-01 LAB — TSH: TSH: 2.94 u[IU]/mL (ref 0.450–4.500)

## 2020-05-01 LAB — BASIC METABOLIC PANEL
BUN/Creatinine Ratio: 18 (ref 9–23)
BUN: 14 mg/dL (ref 6–24)
CO2: 18 mmol/L — ABNORMAL LOW (ref 20–29)
Calcium: 9.5 mg/dL (ref 8.7–10.2)
Chloride: 101 mmol/L (ref 96–106)
Creatinine, Ser: 0.77 mg/dL (ref 0.57–1.00)
Glucose: 78 mg/dL (ref 65–99)
Potassium: 4.4 mmol/L (ref 3.5–5.2)
Sodium: 132 mmol/L — ABNORMAL LOW (ref 134–144)
eGFR: 99 mL/min/{1.73_m2} (ref >59)

## 2020-05-01 LAB — VITAMIN D 25 HYDROXY (VIT D DEFICIENCY, FRACTURES): Vit D, 25-Hydroxy: 10.7 ng/mL — ABNORMAL LOW (ref 30.0–100.0)

## 2020-05-03 ENCOUNTER — Telehealth (HOSPITAL_COMMUNITY): Payer: Self-pay | Admitting: Emergency Medicine

## 2020-05-03 LAB — CERVICOVAGINAL ANCILLARY ONLY
Bacterial Vaginitis (gardnerella): NEGATIVE
Candida Glabrata: NEGATIVE
Candida Vaginitis: NEGATIVE
Chlamydia: NEGATIVE
Comment: NEGATIVE
Comment: NEGATIVE
Comment: NEGATIVE
Comment: NEGATIVE
Comment: NEGATIVE
Comment: NORMAL
Neisseria Gonorrhea: NEGATIVE
Trichomonas: NEGATIVE

## 2020-05-03 MED ORDER — VITAMIN D (ERGOCALCIFEROL) 1.25 MG (50000 UNIT) PO CAPS
50000.0000 [IU] | ORAL_CAPSULE | ORAL | 0 refills | Status: AC
Start: 2020-05-03 — End: 2020-06-08

## 2020-06-29 ENCOUNTER — Ambulatory Visit (INDEPENDENT_AMBULATORY_CARE_PROVIDER_SITE_OTHER): Payer: 59 | Admitting: Podiatry

## 2020-06-29 ENCOUNTER — Encounter: Payer: Self-pay | Admitting: Nurse Practitioner

## 2020-06-29 ENCOUNTER — Other Ambulatory Visit: Payer: Self-pay | Admitting: Podiatry

## 2020-06-29 ENCOUNTER — Ambulatory Visit (INDEPENDENT_AMBULATORY_CARE_PROVIDER_SITE_OTHER): Payer: 59 | Admitting: Nurse Practitioner

## 2020-06-29 ENCOUNTER — Ambulatory Visit (INDEPENDENT_AMBULATORY_CARE_PROVIDER_SITE_OTHER): Payer: 59

## 2020-06-29 ENCOUNTER — Ambulatory Visit (INDEPENDENT_AMBULATORY_CARE_PROVIDER_SITE_OTHER): Payer: 59 | Admitting: Clinical

## 2020-06-29 ENCOUNTER — Other Ambulatory Visit: Payer: Self-pay

## 2020-06-29 VITALS — BP 116/75 | HR 90 | Temp 99.1°F | Resp 18 | Ht 64.0 in | Wt 200.0 lb

## 2020-06-29 DIAGNOSIS — M2011 Hallux valgus (acquired), right foot: Secondary | ICD-10-CM | POA: Diagnosis not present

## 2020-06-29 DIAGNOSIS — M7732 Calcaneal spur, left foot: Secondary | ICD-10-CM | POA: Diagnosis not present

## 2020-06-29 DIAGNOSIS — M722 Plantar fascial fibromatosis: Secondary | ICD-10-CM

## 2020-06-29 DIAGNOSIS — M216X9 Other acquired deformities of unspecified foot: Secondary | ICD-10-CM

## 2020-06-29 DIAGNOSIS — Z139 Encounter for screening, unspecified: Secondary | ICD-10-CM | POA: Diagnosis not present

## 2020-06-29 DIAGNOSIS — Z0001 Encounter for general adult medical examination with abnormal findings: Secondary | ICD-10-CM

## 2020-06-29 DIAGNOSIS — E049 Nontoxic goiter, unspecified: Secondary | ICD-10-CM | POA: Insufficient documentation

## 2020-06-29 DIAGNOSIS — Z8742 Personal history of other diseases of the female genital tract: Secondary | ICD-10-CM

## 2020-06-29 DIAGNOSIS — K5900 Constipation, unspecified: Secondary | ICD-10-CM

## 2020-06-29 DIAGNOSIS — F331 Major depressive disorder, recurrent, moderate: Secondary | ICD-10-CM

## 2020-06-29 DIAGNOSIS — R1030 Lower abdominal pain, unspecified: Secondary | ICD-10-CM

## 2020-06-29 DIAGNOSIS — F419 Anxiety disorder, unspecified: Secondary | ICD-10-CM | POA: Diagnosis not present

## 2020-06-29 DIAGNOSIS — M21621 Bunionette of right foot: Secondary | ICD-10-CM

## 2020-06-29 DIAGNOSIS — H6121 Impacted cerumen, right ear: Secondary | ICD-10-CM | POA: Insufficient documentation

## 2020-06-29 DIAGNOSIS — M79671 Pain in right foot: Secondary | ICD-10-CM

## 2020-06-29 DIAGNOSIS — M2012 Hallux valgus (acquired), left foot: Secondary | ICD-10-CM

## 2020-06-29 MED ORDER — DEBROX 6.5 % OT SOLN: 5.0000 [drp] | Freq: Two times a day (BID) | OTIC | 0 refills | Status: DC

## 2020-06-29 MED ORDER — MELOXICAM 15 MG PO TABS
15.0000 mg | ORAL_TABLET | Freq: Every day | ORAL | 0 refills | Status: DC
Start: 2020-06-29 — End: 2020-07-06

## 2020-06-29 NOTE — Assessment & Plan Note (Signed)
-  enlarged thyroid on physical exam today

## 2020-06-29 NOTE — Assessment & Plan Note (Signed)
-  referral to GYN for well woman exam

## 2020-06-29 NOTE — Patient Instructions (Signed)

## 2020-06-29 NOTE — Progress Notes (Signed)
  Subjective:  Patient ID: Meghan Carter, female    DOB: 08-29-1980,  MRN: [id number]  Chief Complaint  Patient presents with  . Foot Problem    Within the last year, has gotten progressively worse. Pain originated in heel of left and sides/arch of right foot. Pains radiate up legs. Has to stand for a few mins before walking. Tx: different shoes; hot massages.     40 y.o. female presents with the above complaint. History confirmed with patient.   Objective:  Physical Exam: warm, good capillary refill, no trophic changes or ulcerative lesions, normal DP and PT pulses and normal sensory exam. Left Foot: normal exam, no swelling, tenderness, instability; ligaments intact, full range of motion of all ankle/foot joints  Right Foot: POP 5th met head  No images are attached to the encounter.  Radiographs: X-ray of both feet: no fracture, dislocation, swelling or degenerative changes noted, posterior calcaneal spur, Haglund deformity noted and hallux valgus deformity Assessment:   1. Plantar fasciitis   2. Calcaneal spur of left foot   3. Equinus deformity of foot   4. Acquired hallux valgus of both feet   5. Tailor's bunion of right foot      Plan:  Patient was evaluated and treated and all questions answered.  Plantar Fasciitis; Tailor's bunion bilat -XR reviewed with patient -Educated patient on stretching and icing of the affected limb -Plantar fascial brace dispensed -Rx for meloxicam. Educated on use, risks and benefits of the medication -Consider injection left heel next visit if pain persists  Return in about 3 weeks (around 07/20/2020) for Plantar fasciitis.

## 2020-06-29 NOTE — Assessment & Plan Note (Signed)
-  will screen for HCV with next set of labs

## 2020-06-29 NOTE — Patient Instructions (Signed)
I sent in debrox eardrops for ear wax impaction. We will irrigate in a week, and maybe we can draw your labs on the same day.

## 2020-06-29 NOTE — Assessment & Plan Note (Signed)
-  enlarged thyroid bilaterally, no nodules palpated -will check thyroid panel and U/S
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
## 2020-06-29 NOTE — Progress Notes (Signed)
Established Patient Office Visit  Subjective:  Patient ID: Meghan Carter, female    DOB: Feb 04, 1981  Age: 40 y.o. MRN: [id number]  CC:  Chief Complaint  Patient presents with  . Annual Exam    Pt will hold off on pap - started her cycle. Pt would like blood work.     HPI Meghan Carter presents for physical exam.  She saw LCSW today and had recommendation to continue individual therapy for depression with anxiety.  She also saw Dr. March Rummage with podiatry today for plantar fasciitis.  She will use meloxicam and perform stretching for this, and f/u in 3 weeks... will consider injection at that time.  She is having lower abdominal pain and right flank pain.  She has hx of endometriosis, and is concerned that this has returned.  Last GYN appointment may have been around 2019.  She rates her pain at 10/10 and lasts a few seconds up to a minute and occurs once every 2-3 days.  It is worse when she is at work when she has been standing or walking for a long time.  She states that she has been very constipated.  She has been using mag citrate weekly. Her last BM was this AM.  She gets numbness in her hands an fingers at times, but this is worse at night.  Past Medical History:  Diagnosis Date  . Acute blood loss anemia 03/26/2015  . Anemia   . Anxiety   . Atherosclerosis of arteries   . Blood transfusion without reported diagnosis 2017   2 units, no reaction  . Callus of foot 02/08/2011  . Depression   . Dysmenorrhea   . Endometrial polyp 02/11/2014  . Fecal occult blood test positive 01/27/2014  . Fecal occult blood test positive 01/27/2014  . Fibroid   . High-tone pelvic floor dysfunction 03/17/2015  . History of abnormal cervical Pap smear 02/11/2014  . History of acute pancreatitis 03/31/2011  . HSV (herpes simplex virus) infection   . Hydrosalpinx    Bilateral  . Internal hemorrhoids   . 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 Lower abdominal pain   . Menorrhagia  with regular cycle 01/27/2014  . Menorrhagia with regular cycle 01/27/2014  . Other fatigue   . Pelvic pain in female 01/27/2014  . Plantar fasciitis 02/08/2011  . Rectal bleeding 01/27/2014  . Rectal pain 03/19/2014  . Severe anemia   . Vaginal Pap smear, abnormal     Past Surgical History:  Procedure Laterality Date  . CHROMOPERTUBATION  08/14/2017   Procedure: CHROMOPERTUBATION;  Surgeon: Governor Specking, MD;  Location: [facility];  Service: Gynecology;;  . COLONOSCOPY N/A 03/23/2014   SLF: 1. The examined terminal ileum appeared to be normal. 2. The left colon is redundant 3. Moderate sized internal hemorrhoids  . ECTOPIC PREGNANCY SURGERY Left 2005  . FLEXIBLE SIGMOIDOSCOPY N/A 04/01/2015   SLF: rectal bleeding due to large internal hemorrhoids 2. anemia due to rectal bleeding/memses/ poor iron intake.  Marland Kitchen FLEXIBLE SIGMOIDOSCOPY N/A 03/27/2015   Procedure: FLEXIBLE SIGMOIDOSCOPY;  Surgeon: Danie Binder, MD;  Location: [facility];  Service: Endoscopy;  Laterality: N/A;  . HEMORRHOID BANDING N/A 03/23/2014   Procedure: HEMORRHOID BANDING;  Surgeon: Danie Binder, MD;  [facility]: [facility];  Service: Endoscopy;  Laterality: N/A;  . HEMORRHOID BANDING  03/27/2015   Procedure: HEMORRHOID BANDING;  Surgeon: Danie Binder, MD;  [facility]: [facility];  Service: Endoscopy;;  . HEMORRHOID SURGERY  N/A 01/11/2018   Procedure: EXTENSIVE HEMORRHOIDECTOMY;  Surgeon: Aviva Signs, MD;  Location: [facility];  Service: General;  Laterality: N/A;  . HYSTEROSCOPY WITH D & C N/A 06/16/2014   Procedure: DILATATION AND CURETTAGE /HYSTEROSCOPY;  Surgeon: Jonnie Kind, MD;  Location: [facility];  Service: Gynecology;  Laterality: N/A;  . LAPAROSCOPIC UNILATERAL SALPINGECTOMY Right 06/16/2014   Procedure: LAPAROSCOPIC UNILATERAL SALPINGECTOMY;  Surgeon: Jonnie Kind, MD;  Location: [facility];  Service: Gynecology;  Laterality: Right;  . LAPAROSCOPY N/A 08/14/2017   Procedure: OPERATIVE LAPAROSCOPY ,LEFT  SALPINGECTOMY, LYSIS OF ADHESIONS;  Surgeon: Governor Specking, MD;  Location: [facility];  Service: Gynecology;  Laterality: N/A;  . POLYPECTOMY N/A 06/16/2014   Procedure: POLYPECTOMY (endometrial);  Surgeon: Jonnie Kind, MD;  Location: [facility];  Service: Gynecology;  Laterality: N/A;    Family History  Problem Relation Age of Onset  . Diabetes Father   . Diabetes Paternal Grandmother   . Diabetes Paternal Grandfather     Social History   Socioeconomic History  . Marital status: Legally Separated    Spouse name: Not on file  . Number of children: Not on file  . Years of education: Not on file  . Highest education level: Not on file  Occupational History  . Not on file  Tobacco Use  . Smoking status: Light Tobacco Smoker    Packs/day: 0.25    Years: 27.00    Pack years: 6.75    Types: Cigarettes  . Smokeless tobacco: Never Used  Vaping Use  . Vaping Use: Never used  Substance and Sexual Activity  . Alcohol use: Yes    Comment: "very little"  . Drug use: No  . Sexual activity: Yes    Partners: Male    Birth control/protection: None  Other Topics Concern  . Not on file  Social History Narrative  . Not on file   Social Determinants of Health   Financial Resource Strain: Not on file  Food Insecurity: Not on file  Transportation Needs: Not on file  Physical Activity: Not on file  Stress: Not on file  Social Connections: Not on file  Intimate Partner Violence: Not on file    Outpatient Medications Prior to Visit  Medication Sig Dispense Refill  . meloxicam (MOBIC) 15 MG tablet Take 1 tablet (15 mg total) by mouth daily. 30 tablet 0  . amoxicillin-clavulanate (AUGMENTIN) 875-125 MG tablet SMARTSIG:1 Tablet(s) By Mouth Every 12 Hours    . Boric Acid Vaginal 600 MG SUPP Place 600 mg vaginally at bedtime. 14 suppository 0  . carbamide peroxide (DEBROX) 6.5 % OTIC solution Place 5 drops into both ears 2 (two) times daily. 15 mL 0  . fluticasone (FLONASE) 50 MCG/ACT  nasal spray Place 1 spray into both nostrils daily. 16 g 0  . loratadine (CLARITIN) 10 MG tablet Take 1 tablet (10 mg total) by mouth daily. 30 tablet 1  . metroNIDAZOLE (FLAGYL) 500 MG tablet Take 1 tablet (500 mg total) by mouth 2 (two) times daily. 14 tablet 0   No facility-administered medications prior to visit.    Allergies  Allergen Reactions  . Clindamycin/Lincomycin Itching  . Amoxil [Amoxicillin]   . Prednisone   . Doxycycline Hyclate Rash    ROS Review of Systems  Constitutional: Negative.   HENT: Negative.   Eyes: Positive for visual disturbance.  Respiratory: Negative.   Cardiovascular: Negative.   Gastrointestinal: Positive for abdominal pain and constipation. Negative for blood in stool, diarrhea, nausea, rectal pain  and vomiting.  Endocrine: Negative.   Genitourinary: Negative.   Musculoskeletal: Negative.   Skin: Negative.   Allergic/Immunologic: Negative.   Neurological: Negative.   Hematological: Negative.   Psychiatric/Behavioral: Negative.       Objective:    Physical Exam Constitutional:      Appearance: Normal appearance. She is obese.  HENT:     Head: Normocephalic and atraumatic.     Right Ear: External ear normal. There is impacted cerumen.     Left Ear: Tympanic membrane, ear canal and external ear normal.     Nose: Nose normal.     Mouth/Throat:     Mouth: Mucous membranes are moist.     Pharynx: Oropharynx is clear.  Eyes:     Extraocular Movements: Extraocular movements intact.     Conjunctiva/sclera: Conjunctivae normal.     Pupils: Pupils are equal, round, and reactive to light.  Neck:     Comments: Enlarged thyroid Cardiovascular:     Rate and Rhythm: Normal rate and regular rhythm.     Pulses: Normal pulses.     Heart sounds: Normal heart sounds.  Pulmonary:     Effort: Pulmonary effort is normal.     Breath sounds: Normal breath sounds.  Abdominal:     General: Abdomen is flat. Bowel sounds are normal.     Palpations:  Abdomen is soft.  Musculoskeletal:        General: Normal range of motion.  Skin:    General: Skin is warm and dry.     Capillary Refill: Capillary refill takes less than 2 seconds.  Neurological:     General: No focal deficit present.     Mental Status: She is alert and oriented to person, place, and time.     Cranial Nerves: No cranial nerve deficit.     Sensory: No sensory deficit.     Motor: No weakness.     Coordination: Coordination normal.     Gait: Gait normal.  Psychiatric:        Behavior: Behavior normal.        Thought Content: Thought content normal.        Judgment: Judgment normal.     Comments: Anxious affect     BP 116/75   Pulse 90   Temp 99.1 F (37.3 C)   Resp 18   Ht _0  (1.626 m)   Wt 200 lb (90.7 kg)   SpO2 97%   BMI 34.33 kg/m  Wt Readings from Last 3 Encounters:  06/29/20 200 lb (90.7 kg)  01/09/19 205 lb (93 kg)  12/25/18 205 lb (93 kg)     Health Maintenance Due  Topic Date Due  . COVID-19 Vaccine (1) Never done  . Hepatitis C Screening  Never done  . PAP SMEAR-Modifier  03/02/2020    There are no preventive care reminders to display for this patient.  Lab Results  Component Value Date   TSH 2.940 04/30/2020   Lab Results  Component Value Date   WBC 5.1 04/30/2020   HGB 13.6 04/30/2020   HCT 40.4 04/30/2020   MCV 83 04/30/2020   PLT 309 04/30/2020   Lab Results  Component Value Date   NA 132 (L) 04/30/2020   K 4.4 04/30/2020   CO2 18 (L) 04/30/2020   GLUCOSE 78 04/30/2020   BUN 14 04/30/2020   CREATININE 0.77 04/30/2020   BILITOT 0.5 01/09/2019   ALKPHOS 63 01/09/2019   AST 15 01/09/2019   ALT 13 01/09/2019  PROT 7.3 01/09/2019   ALBUMIN 3.9 01/09/2019   CALCIUM 9.5 04/30/2020   ANIONGAP 6 01/09/2019   EGFR 99 04/30/2020   No results found for: CHOL No results found for: HDL No results found for: LDLCALC No results found for: TRIG No results found for: CHOLHDL No results found for: HGBA1C    Assessment  & Plan:   Problem List Items Addressed This Visit      Endocrine   Enlarged thyroid    -enlarged thyroid bilaterally, no nodules palpated -will check thyroid panel and U/S      Relevant Orders   TSH+T4F+T3Free   US THYROID     Nervous and Auditory   Right ear impacted cerumen   Relevant Medications   carbamide peroxide (DEBROX) 6.5 % OTIC solution     Other   History of abnormal cervical Pap smear    -referral to GYN for well woman exam      Abdominal pain    -lower abdominal and right flank pain -unsure of etiology; DDx include kidney stone, constipation, endometriosis among others -U/A would be inconclusive since she is menstruating currently -will get U/S of abdomen and kidneys to r/o calculi as well as check labs -referral to GYN- (also for PAP and hx of endometriosis)      Relevant Orders   US Renal   US Abdomen Complete   Ambulatory referral to Gynecology   Annual physical exam - Primary    -enlarged thyroid on physical exam today      Screening due    -will screen for HCV with next set of labs      Relevant Orders   Hepatitis C antibody   Ambulatory referral to Gynecology   Constipation    -unsure of etiology -she states that she had a small BM today and that she is using mag citrate about once per week and laxative use is becoming more frequent -we discussed GI consult, but she would like to complete other work-ups before going to GI         Meds ordered this encounter  Medications  . carbamide peroxide (DEBROX) 6.5 % OTIC solution    Sig: Place 5 drops into the right ear 2 (two) times daily.    Dispense:  15 mL    Refill:  0    Follow-up: Return in about 1 week (around 07/06/2020) for Cerumen Irrigation.    Noreene Larsson, NP

## 2020-06-29 NOTE — Assessment & Plan Note (Addendum)
-  lower abdominal and right flank pain -unsure of etiology; DDx include kidney stone, constipation, endometriosis among others -U/A would be inconclusive since she is menstruating currently -will get U/S of abdomen and kidneys to r/o calculi as well as check labs -referral to GYN- (also for PAP and hx of endometriosis)

## 2020-06-29 NOTE — Assessment & Plan Note (Signed)
-  unsure of etiology -she states that she had a small BM today and that she is using mag citrate about once per week and laxative use is becoming more frequent -we discussed GI consult, but she would like to complete other work-ups before going to GI

## 2020-07-03 ENCOUNTER — Encounter: Payer: Self-pay | Admitting: Podiatry

## 2020-07-06 ENCOUNTER — Ambulatory Visit (INDEPENDENT_AMBULATORY_CARE_PROVIDER_SITE_OTHER): Payer: 59 | Admitting: Nurse Practitioner

## 2020-07-06 ENCOUNTER — Encounter: Payer: Self-pay | Admitting: Nurse Practitioner

## 2020-07-06 ENCOUNTER — Other Ambulatory Visit: Payer: Self-pay

## 2020-07-06 VITALS — BP 111/75 | HR 82 | Temp 98.5°F | Resp 20 | Ht 64.0 in | Wt 201.0 lb

## 2020-07-06 DIAGNOSIS — E049 Nontoxic goiter, unspecified: Secondary | ICD-10-CM

## 2020-07-06 DIAGNOSIS — H6121 Impacted cerumen, right ear: Secondary | ICD-10-CM | POA: Diagnosis not present

## 2020-07-06 DIAGNOSIS — R1030 Lower abdominal pain, unspecified: Secondary | ICD-10-CM

## 2020-07-06 MED ORDER — IBUPROFEN 800 MG PO TABS: 800.0000 mg | ORAL_TABLET | Freq: Three times a day (TID) | ORAL | 0 refills | Status: DC | PRN

## 2020-07-06 NOTE — Patient Instructions (Signed)
Please have fasting labs drawn 2-3 days prior to your appointment so we can discuss the results during your office visit.  

## 2020-07-06 NOTE — Assessment & Plan Note (Signed)
-  irrigated today -after irrigation TMs and bony landmarks visible

## 2020-07-06 NOTE — Assessment & Plan Note (Signed)
-  was taking meloxicam previously; she states she had a rash -stop meloxicam -Rx. Ibuprofen; states she took this at home with no adverse effects -we discussed that they are in the same drug class and if she has any issues to notify our office

## 2020-07-06 NOTE — Progress Notes (Signed)
Acute Office Visit  Subjective:    Patient ID: Meghan Carter, female    DOB: 10/18/1980, 40 y.o.   MRN: [id number]  Chief Complaint  Patient presents with  . Cerumen Impaction    HPI Patient is in today for right cerumen impaction. She was seen on 06/29/20, and was prescribed debrox at that time.  Past Medical History:  Diagnosis Date  . Acute blood loss anemia 03/26/2015  . Anemia   . Anxiety   . Atherosclerosis of arteries   . Blood transfusion without reported diagnosis 2017   2 units, no reaction  . Callus of foot 02/08/2011  . Depression   . Dysmenorrhea   . Endometrial polyp 02/11/2014  . Fecal occult blood test positive 01/27/2014  . Fecal occult blood test positive 01/27/2014  . Fibroid   . High-tone pelvic floor dysfunction 03/17/2015  . History of abnormal cervical Pap smear 02/11/2014  . History of acute pancreatitis 03/31/2011  . HSV (herpes simplex virus) infection   . Hydrosalpinx    Bilateral  . Internal hemorrhoids   . 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 Lower abdominal pain   . Menorrhagia with regular cycle 01/27/2014  . Menorrhagia with regular cycle 01/27/2014  . Other fatigue   . Pelvic pain in female 01/27/2014  . Plantar fasciitis 02/08/2011  . Rectal bleeding 01/27/2014  . Rectal pain 03/19/2014  . Severe anemia   . Vaginal Pap smear, abnormal     Past Surgical History:  Procedure Laterality Date  . CHROMOPERTUBATION  08/14/2017   Procedure: CHROMOPERTUBATION;  Surgeon: Governor Specking, MD;  Location: [facility];  Service: Gynecology;;  . COLONOSCOPY N/A 03/23/2014   SLF: 1. The examined terminal ileum appeared to be normal. 2. The left colon is redundant 3. Moderate sized internal hemorrhoids  . ECTOPIC PREGNANCY SURGERY Left 2005  . FLEXIBLE SIGMOIDOSCOPY N/A 04/01/2015   SLF: rectal bleeding due to large internal hemorrhoids 2. anemia due to rectal bleeding/memses/ poor iron intake.  Marland Kitchen FLEXIBLE SIGMOIDOSCOPY N/A 03/27/2015    Procedure: FLEXIBLE SIGMOIDOSCOPY;  Surgeon: Danie Binder, MD;  Location: [facility];  Service: Endoscopy;  Laterality: N/A;  . HEMORRHOID BANDING N/A 03/23/2014   Procedure: HEMORRHOID BANDING;  Surgeon: Danie Binder, MD;  [facility];  Service: Endoscopy;  Laterality: N/A;  . HEMORRHOID BANDING  03/27/2015   Procedure: HEMORRHOID BANDING;  Surgeon: Danie Binder, MD;  [facility];  Service: Endoscopy;;  . HEMORRHOID SURGERY N/A 01/11/2018   Procedure: EXTENSIVE HEMORRHOIDECTOMY;  Surgeon: Aviva Signs, MD;  Location: [facility];  Service: General;  Laterality: N/A;  . HYSTEROSCOPY WITH D & C N/A 06/16/2014   Procedure: DILATATION AND CURETTAGE /HYSTEROSCOPY;  Surgeon: Jonnie Kind, MD;  Location: [facility];  Service: Gynecology;  Laterality: N/A;  . LAPAROSCOPIC UNILATERAL SALPINGECTOMY Right 06/16/2014   Procedure: LAPAROSCOPIC UNILATERAL SALPINGECTOMY;  Surgeon: Jonnie Kind, MD;  Location: [facility];  Service: Gynecology;  Laterality: Right;  . LAPAROSCOPY N/A 08/14/2017   Procedure: OPERATIVE LAPAROSCOPY ,LEFT SALPINGECTOMY, LYSIS OF ADHESIONS;  Surgeon: Governor Specking, MD;  Location: [facility];  Service: Gynecology;  Laterality: N/A;  . POLYPECTOMY N/A 06/16/2014   Procedure: POLYPECTOMY (endometrial);  Surgeon: Jonnie Kind, MD;  Location: [facility];  Service: Gynecology;  Laterality: N/A;    Family History  Problem Relation Age of Onset  . Diabetes Father   . Diabetes Paternal Grandmother   . Diabetes Paternal Grandfather     Social  History   Socioeconomic History  . Marital status: Divorced    Spouse name: Not on file  . Number of children: Not on file  . Years of education: Not on file  . Highest education level: Not on file  Occupational History  . Not on file  Tobacco Use  . Smoking status: Light Tobacco Smoker    Packs/day: 0.25    Years: 27.00    Pack years: 6.75    Types: Cigarettes  . Smokeless tobacco: Never Used  Vaping Use  . Vaping  Use: Never used  Substance and Sexual Activity  . Alcohol use: Yes    Comment: "very little"  . Drug use: No  . Sexual activity: Yes    Partners: Male    Birth control/protection: None  Other Topics Concern  . Not on file  Social History Narrative  . Not on file   Social Determinants of Health   Financial Resource Strain: Not on file  Food Insecurity: Not on file  Transportation Needs: Not on file  Physical Activity: Not on file  Stress: Not on file  Social Connections: Not on file  Intimate Partner Violence: Not on file    Outpatient Medications Prior to Visit  Medication Sig Dispense Refill  . carbamide peroxide (DEBROX) 6.5 % OTIC solution Place 5 drops into the right ear 2 (two) times daily. 15 mL 0  . meloxicam (MOBIC) 15 MG tablet Take 1 tablet (15 mg total) by mouth daily. 30 tablet 0   No facility-administered medications prior to visit.    Allergies  Allergen Reactions  . Clindamycin/Lincomycin Itching  . Amoxil [Amoxicillin]   . Prednisone   . Doxycycline Hyclate Rash    Review of Systems  Constitutional: Negative.   HENT:       Right ear fullness  Respiratory: Negative.   Cardiovascular: Negative.        Objective:    Physical Exam Constitutional:      Appearance: Normal appearance.  HENT:     Right Ear: External ear normal. There is impacted cerumen.     Left Ear: Tympanic membrane, ear canal and external ear normal.  Neurological:     Mental Status: She is alert.     BP 111/75   Pulse 82   Temp 98.5 F (36.9 C)   Resp 20   Ht 5' 4"  (1.626 m)   Wt 201 lb (91.2 kg)   SpO2 97%   BMI 34.50 kg/m  Wt Readings from Last 3 Encounters:  07/06/20 201 lb (91.2 kg)  06/29/20 200 lb (90.7 kg)  01/09/19 205 lb (93 kg)    Health Maintenance Due  Topic Date Due  . COVID-19 Vaccine (1) Never done  . Hepatitis C Screening  Never done  . PAP SMEAR-Modifier  03/02/2020    There are no preventive care reminders to display for this  patient.   Lab Results  Component Value Date   TSH 2.940 04/30/2020   Lab Results  Component Value Date   WBC 5.1 04/30/2020   HGB 13.6 04/30/2020   HCT 40.4 04/30/2020   MCV 83 04/30/2020   PLT 309 04/30/2020   Lab Results  Component Value Date   NA 132 (L) 04/30/2020   K 4.4 04/30/2020   CO2 18 (L) 04/30/2020   GLUCOSE 78 04/30/2020   BUN 14 04/30/2020   CREATININE 0.77 04/30/2020   BILITOT 0.5 01/09/2019   ALKPHOS 63 01/09/2019   AST 15 01/09/2019   ALT 13  01/09/2019   PROT 7.3 01/09/2019   ALBUMIN 3.9 01/09/2019   CALCIUM 9.5 04/30/2020   ANIONGAP 6 01/09/2019   EGFR 99 04/30/2020   No results found for: CHOL No results found for: HDL No results found for: LDLCALC No results found for: TRIG No results found for: CHOLHDL No results found for: HGBA1C     Assessment & Plan:   Problem List Items Addressed This Visit      Endocrine   Enlarged thyroid - Primary   Relevant Orders   TSH     Nervous and Auditory   Right ear impacted cerumen    -irrigated today -after irrigation TMs and bony landmarks visible        Other   Abdominal pain   Relevant Orders   CBC with Differential/Platelet   CMP14+EGFR   Lipid Panel With LDL/HDL Ratio       No orders of the defined types were placed in this encounter.    Noreene Larsson, NP

## 2020-07-06 NOTE — Addendum Note (Signed)
Addended by: Demetrius Revel on: 07/06/2020 09:07 AM   Modules accepted: Orders

## 2020-07-07 ENCOUNTER — Telehealth: Payer: Self-pay

## 2020-07-07 ENCOUNTER — Other Ambulatory Visit: Payer: Self-pay | Admitting: Nurse Practitioner

## 2020-07-07 DIAGNOSIS — E7800 Pure hypercholesterolemia, unspecified: Secondary | ICD-10-CM

## 2020-07-07 LAB — CBC WITH DIFFERENTIAL/PLATELET
Basophils Absolute: 0.1 10*3/uL (ref 0.0–0.2)
Basos: 1 %
EOS (ABSOLUTE): 0.4 10*3/uL (ref 0.0–0.4)
Eos: 8 %
Hematocrit: 36.9 % (ref 34.0–46.6)
Hemoglobin: 12.1 g/dL (ref 11.1–15.9)
Immature Grans (Abs): 0 10*3/uL (ref 0.0–0.1)
Immature Granulocytes: 0 %
Lymphocytes Absolute: 2.2 10*3/uL (ref 0.7–3.1)
Lymphs: 49 %
MCH: 27.1 pg (ref 26.6–33.0)
MCHC: 32.8 g/dL (ref 31.5–35.7)
MCV: 83 fL (ref 79–97)
Monocytes Absolute: 0.5 10*3/uL (ref 0.1–0.9)
Monocytes: 10 %
Neutrophils Absolute: 1.4 10*3/uL (ref 1.4–7.0)
Neutrophils: 32 %
Platelets: 297 10*3/uL (ref 150–450)
RBC: 4.47 x10E6/uL (ref 3.77–5.28)
RDW: 12.3 % (ref 11.7–15.4)
WBC: 4.5 10*3/uL (ref 3.4–10.8)

## 2020-07-07 LAB — CMP14+EGFR
ALT: 12 IU/L (ref 0–32)
AST: 16 IU/L (ref 0–40)
Albumin/Globulin Ratio: 1.7 (ref 1.2–2.2)
Albumin: 4.1 g/dL (ref 3.8–4.8)
Alkaline Phosphatase: 68 IU/L (ref 44–121)
BUN/Creatinine Ratio: 21 (ref 9–23)
BUN: 16 mg/dL (ref 6–24)
Bilirubin Total: <0.2 mg/dL (ref 0.0–1.2)
CO2: 19 mmol/L — ABNORMAL LOW (ref 20–29)
Calcium: 9.1 mg/dL (ref 8.7–10.2)
Chloride: 102 mmol/L (ref 96–106)
Creatinine, Ser: 0.75 mg/dL (ref 0.57–1.00)
Globulin, Total: 2.4 g/dL (ref 1.5–4.5)
Glucose: 92 mg/dL (ref 65–99)
Potassium: 4.4 mmol/L (ref 3.5–5.2)
Sodium: 136 mmol/L (ref 134–144)
Total Protein: 6.5 g/dL (ref 6.0–8.5)
eGFR: 103 mL/min/{1.73_m2} (ref >59)

## 2020-07-07 LAB — TSH+T4F+T3FREE
Free T4: 1.07 ng/dL (ref 0.82–1.77)
T3, Free: 2.7 pg/mL (ref 2.0–4.4)
TSH: 2.75 u[IU]/mL (ref 0.450–4.500)

## 2020-07-07 LAB — LIPID PANEL WITH LDL/HDL RATIO
Cholesterol, Total: 231 mg/dL — ABNORMAL HIGH (ref 100–199)
HDL: 52 mg/dL (ref >39)
LDL Chol Calc (NIH): 172 mg/dL — ABNORMAL HIGH (ref 0–99)
LDL/HDL Ratio: 3.3 ratio — ABNORMAL HIGH (ref 0.0–3.2)
Triglycerides: 46 mg/dL (ref 0–149)
VLDL Cholesterol Cal: 7 mg/dL (ref 5–40)

## 2020-07-07 LAB — HEPATITIS C ANTIBODY: Hep C Virus Ab: <0.1 s/co ratio (ref 0.0–0.9)

## 2020-07-07 MED ORDER — ATORVASTATIN CALCIUM 20 MG PO TABS: 20.0000 mg | ORAL_TABLET | Freq: Every day | ORAL | 3 refills | Status: DC

## 2020-07-07 NOTE — Progress Notes (Signed)
LDL, or bad cholesterol, is 172, and we would like it less than 100. Cutting back on fried/fatty foods can help, but adding medicine is much more likely to get you to goal. I sent in a low dose of atorvastatin to help bring down the LDL.

## 2020-07-07 NOTE — Telephone Encounter (Signed)
Patient called asked to switch all her future medicine to [facility]

## 2020-07-07 NOTE — Telephone Encounter (Signed)
Pharmacy changed in the system.

## 2020-07-08 ENCOUNTER — Telehealth: Payer: Self-pay

## 2020-07-08 ENCOUNTER — Other Ambulatory Visit: Payer: Self-pay

## 2020-07-08 DIAGNOSIS — Z124 Encounter for screening for malignant neoplasm of cervix: Secondary | ICD-10-CM

## 2020-07-08 DIAGNOSIS — Z8742 Personal history of other diseases of the female genital tract: Secondary | ICD-10-CM

## 2020-07-08 NOTE — Telephone Encounter (Signed)
Spoke with pt. She is agreeance to go somewhere other than [facility]. Referral ordered.

## 2020-07-08 NOTE — Telephone Encounter (Signed)
She has hx of abnormal pap, so that is why she was referred to GYN for this. If she doesn't like [facility], we can refer her to the GYN of her choice.

## 2020-07-08 NOTE — Telephone Encounter (Signed)
Patient called asked for provider to give her a call regarding a referral that was sent to GYN she would like to know why.  Call back # [phone]

## 2020-07-08 NOTE — Telephone Encounter (Signed)
Looked in her past notes and it looks like she was referred to gyn because she has not had a pap in awhile and also has a hx of endometriosis. She said she was not aware you were referring her and wants to speak with you only because she is upset about this.

## 2020-07-09 ENCOUNTER — Ambulatory Visit (HOSPITAL_COMMUNITY)
Admission: RE | Admit: 2020-07-09 | Discharge: 2020-07-09 | Disposition: A | Discharge status: Home / Self Care | Payer: 59 | Source: Ambulatory Visit | Attending: Nurse Practitioner | Admitting: Nurse Practitioner

## 2020-07-09 DIAGNOSIS — E049 Nontoxic goiter, unspecified: Secondary | ICD-10-CM

## 2020-07-09 DIAGNOSIS — R1030 Lower abdominal pain, unspecified: Secondary | ICD-10-CM | POA: Diagnosis present

## 2020-07-09 IMAGING — US US RENAL
1 series · 14 of 25 positions shown · non-contrast
Comparison: CT abdomen pelvis [DATE]

CLINICAL DATA: Colicky lower abdominal and right flank pain for 1
year

EXAM:
RENAL / URINARY TRACT ULTRASOUND COMPLETE

[Series 1: us renal · 0.23mm/px · 14 of 43 slices shown]
[im 1/43]
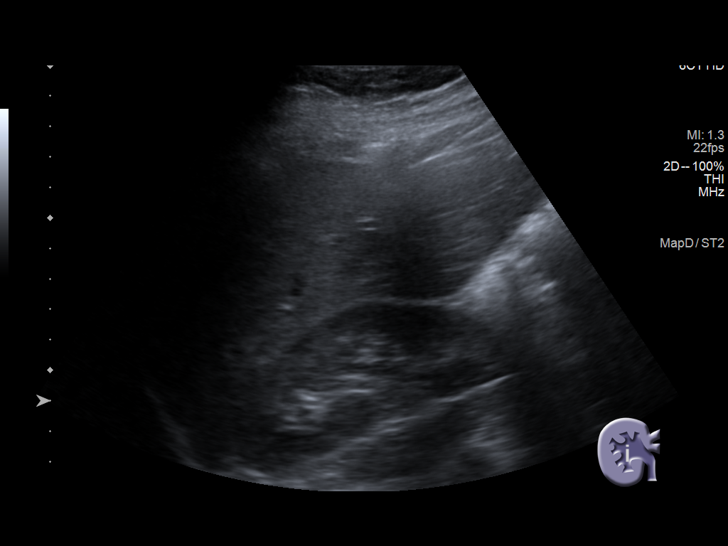
[im 4/43]
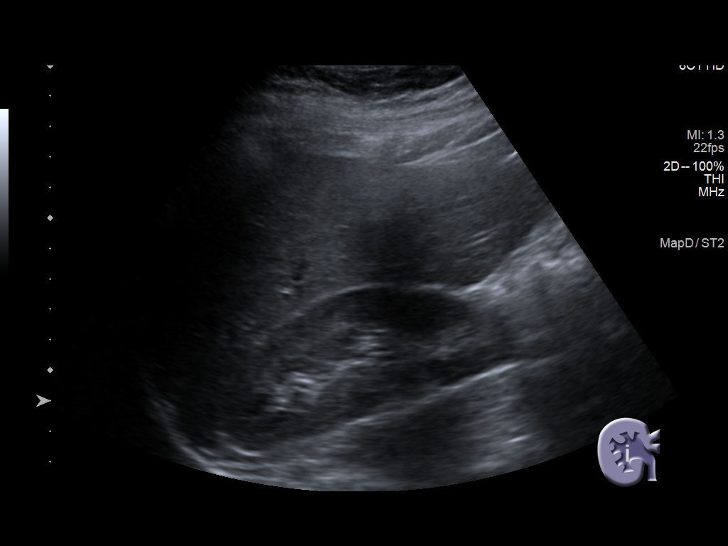
[im 8/43]
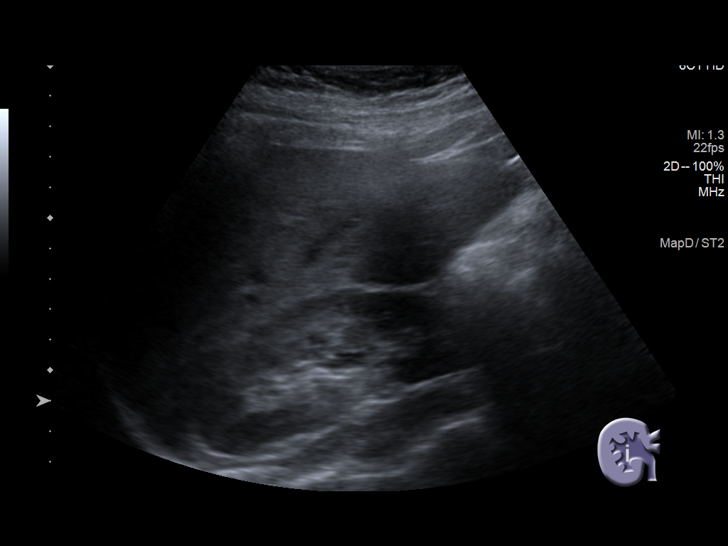
[im 11/43]
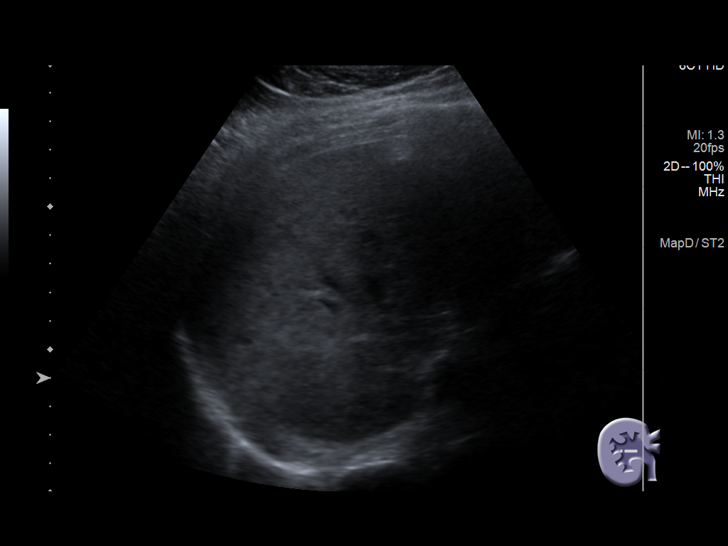
[im 15/43]
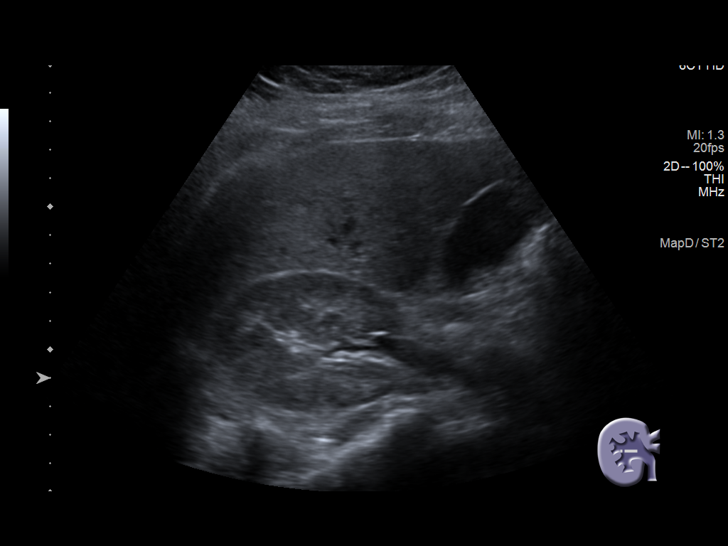
[im 16/43]
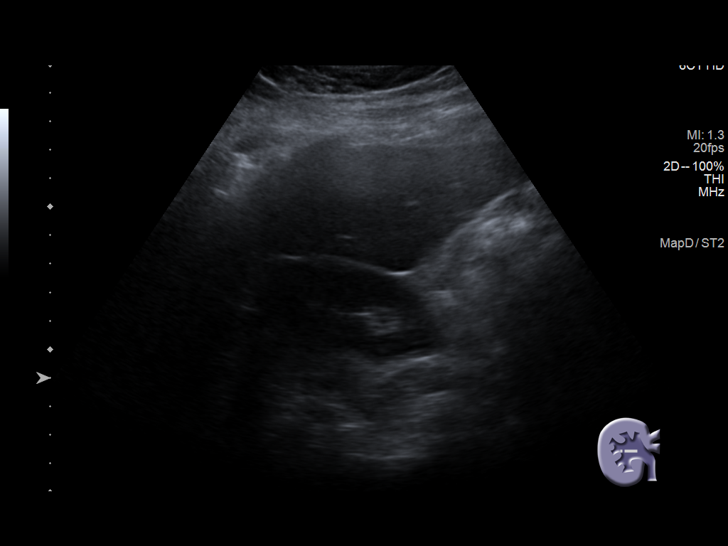
[im 20/43]
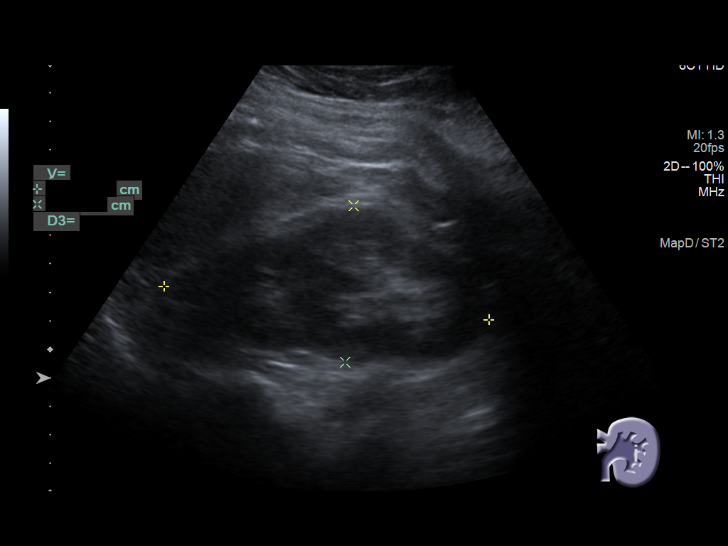
[im 23/43]
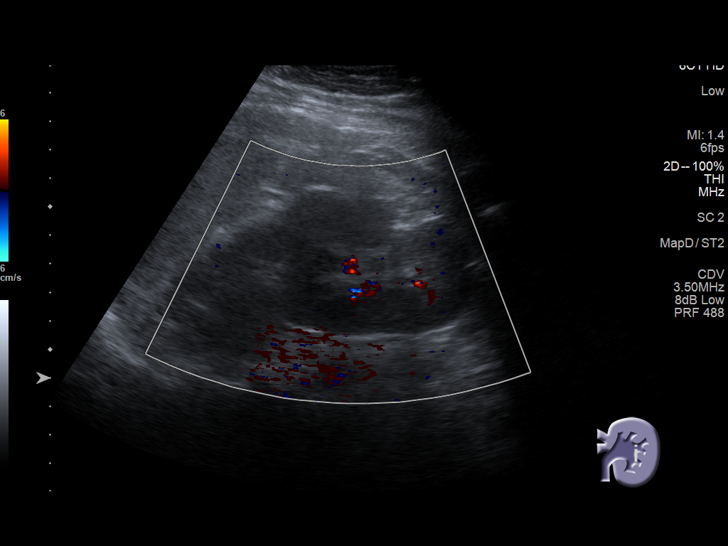
[im 27/43]
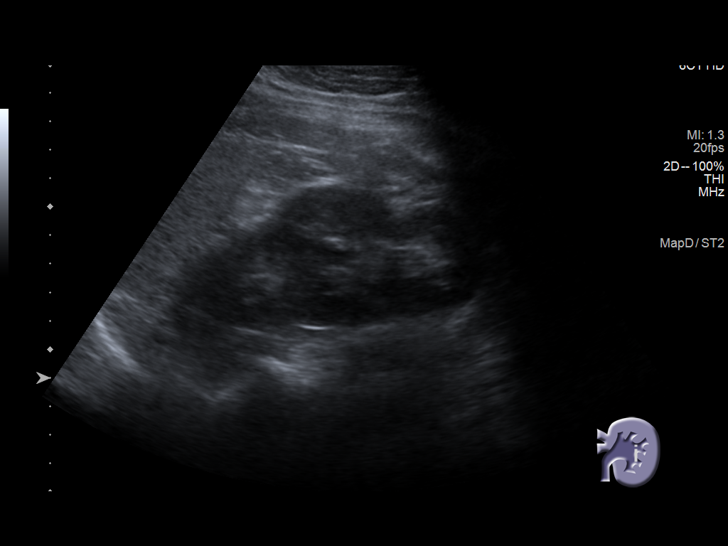
[im 29/43]
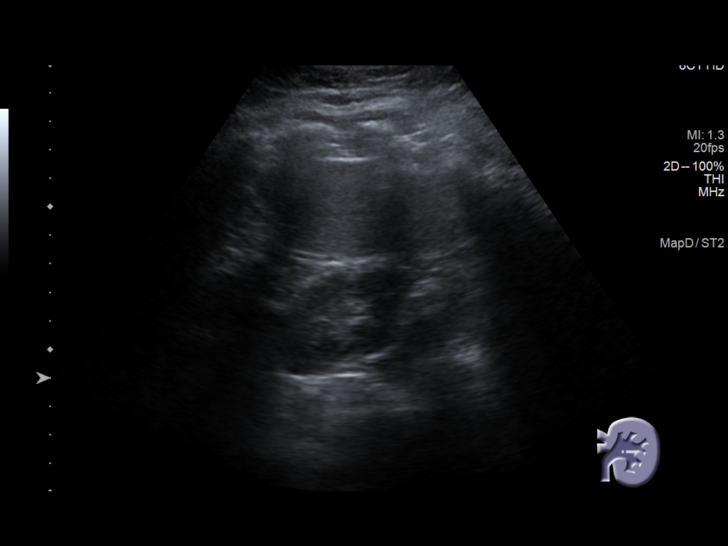
[im 32/43]
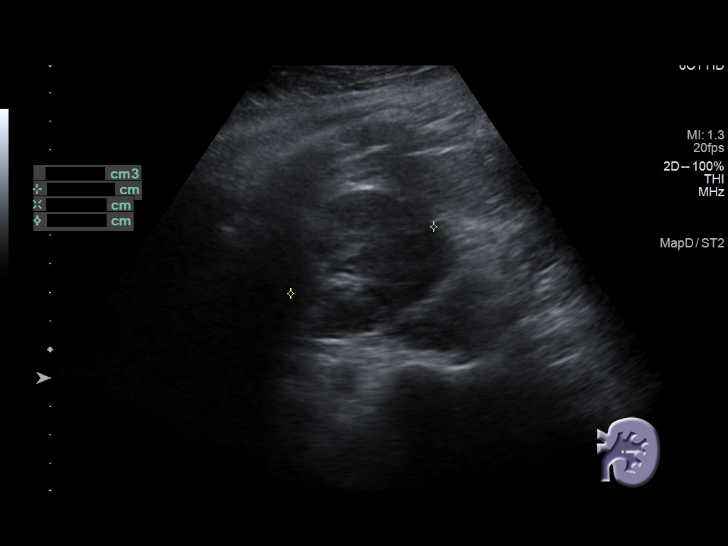
[im 36/43]
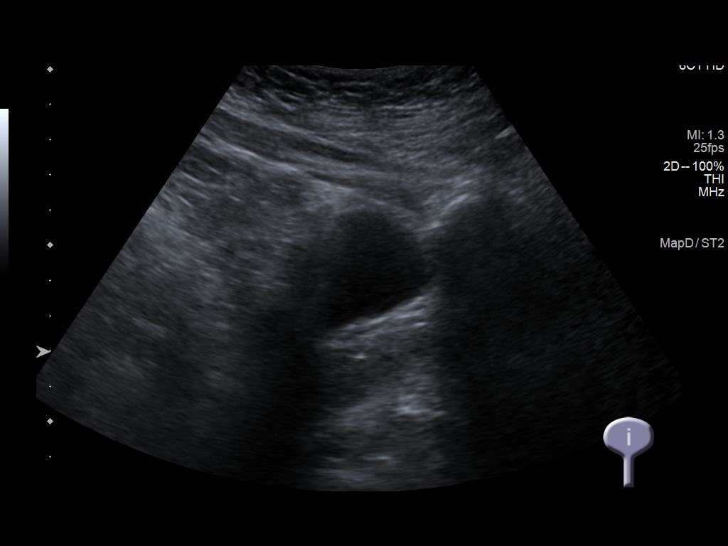
[im 39/43]
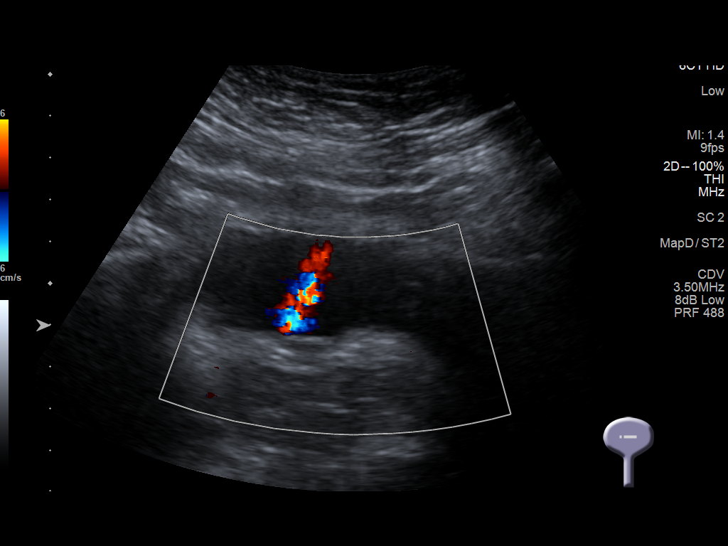
[im 43/43]
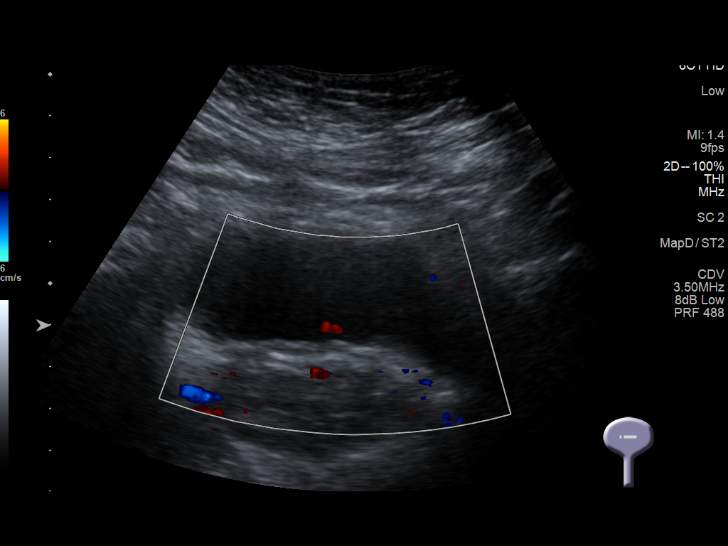

[14 of 25 positions shown; findings below may reference images not displayed]

FINDINGS: Right Kidney:

Renal measurements: 10.6 x 4.2 x 7.1 cm = volume: 163 mL.
Echogenicity within normal limits. No mass or hydronephrosis
visualized.

Left Kidney:

Renal measurements: 11.5 x 5.5 x 5.5 cm = volume: 182 mL.
Echogenicity within normal limits. No mass or hydronephrosis
visualized.

Bladder:

Appears normal for degree of bladder distention.

Other:

None.
IMPRESSION: No significant sonographic abnormality of the kidneys.

## 2020-07-09 IMAGING — US US ABDOMEN COMPLETE
1 series · 14 of 25 positions shown · non-contrast
Comparison: CT abdomen pelvis [DATE]

CLINICAL DATA: Intermittent colicky lower abdominal and right flank
pain for 1 year

EXAM:
ABDOMEN ULTRASOUND COMPLETE

[Series 1: us abdomen complete · 0.21mm/px · 14 of 95 slices shown]
[im 1/95]
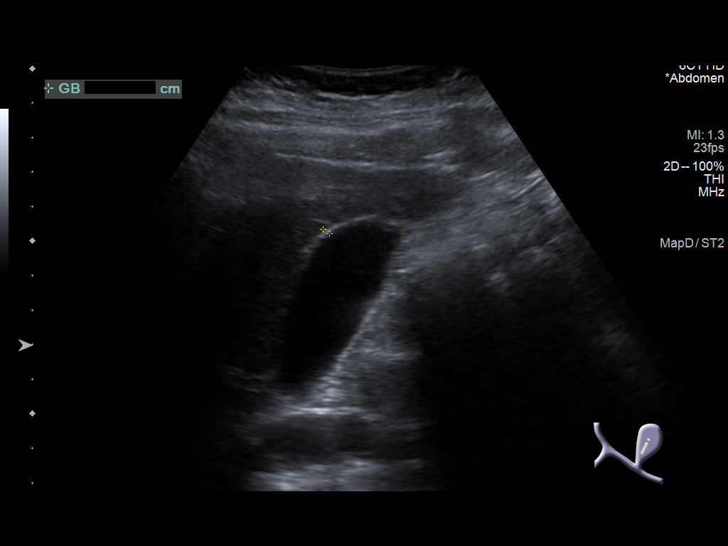
[im 8/95]
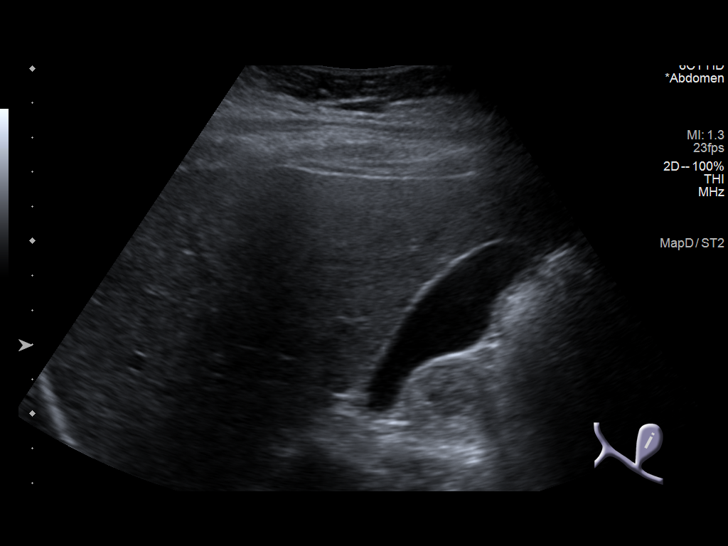
[im 16/95]
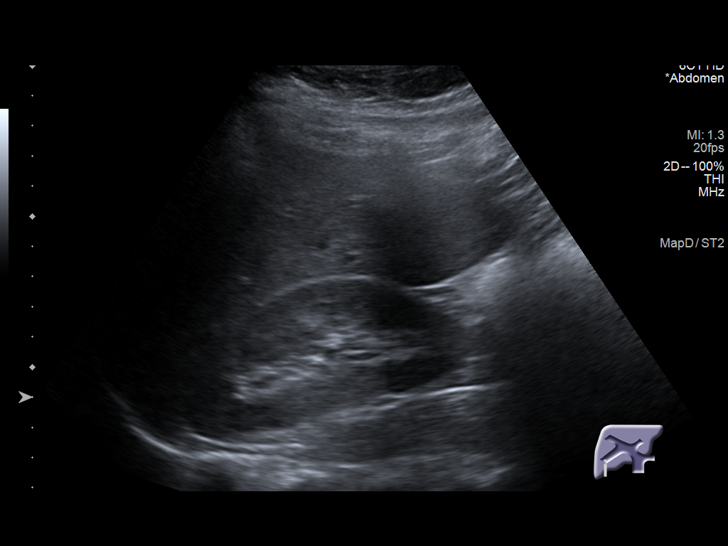
[im 24/95]
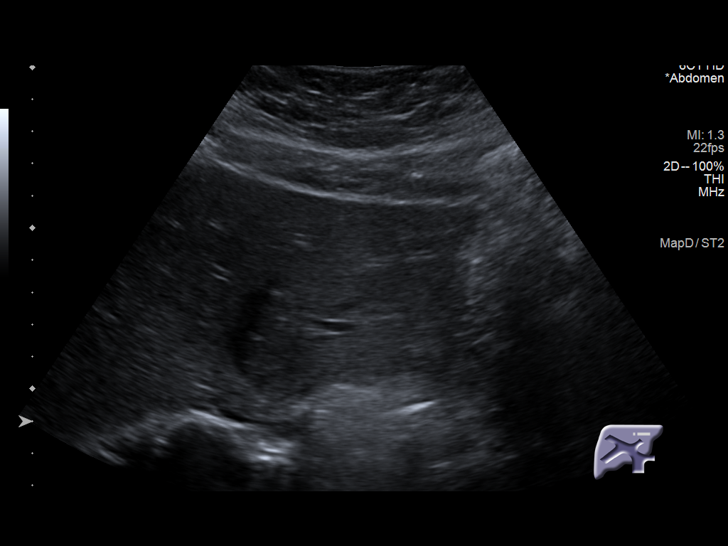
[im 32/95]
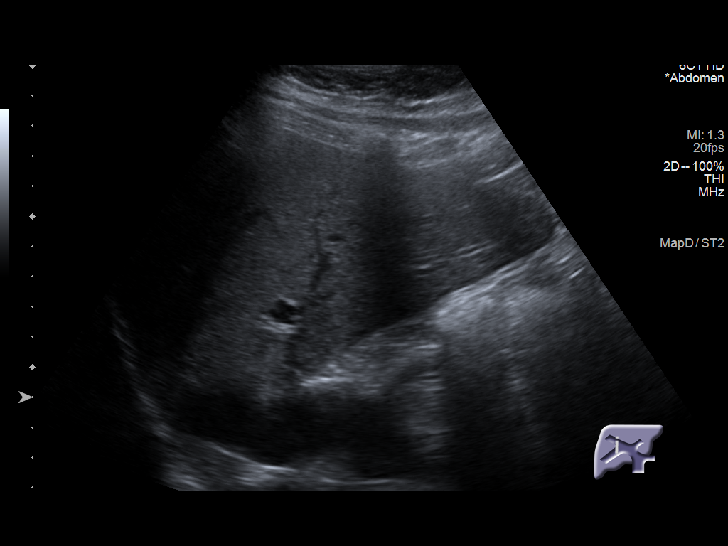
[im 36/95]
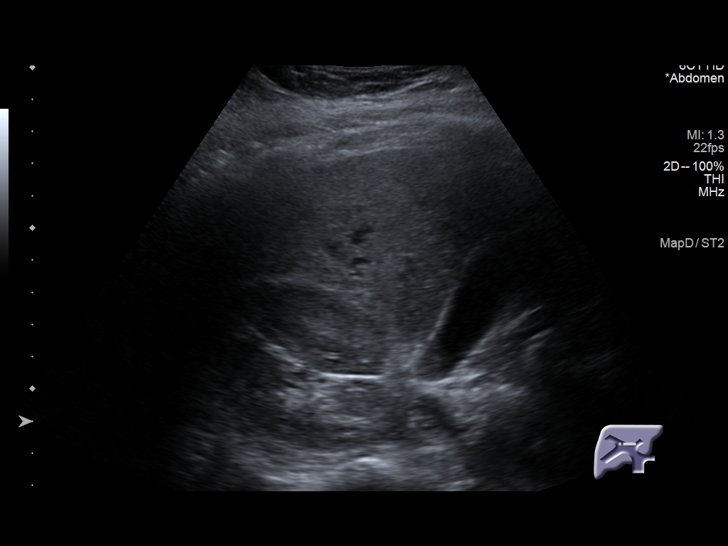
[im 44/95]
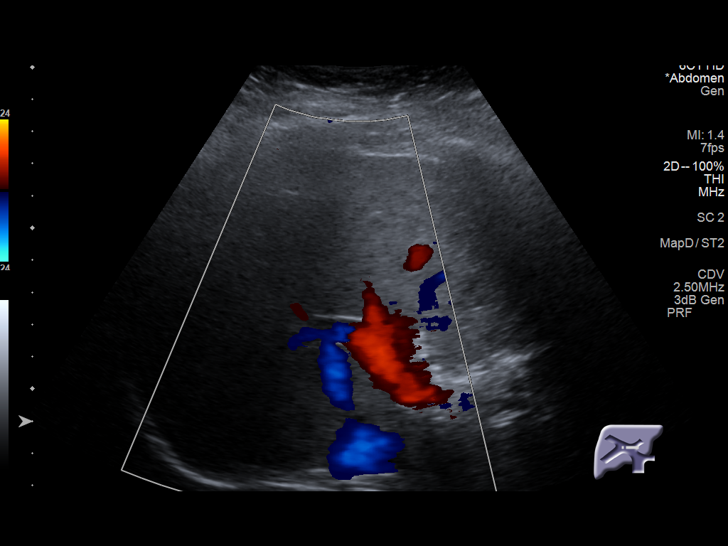
[im 51/95]
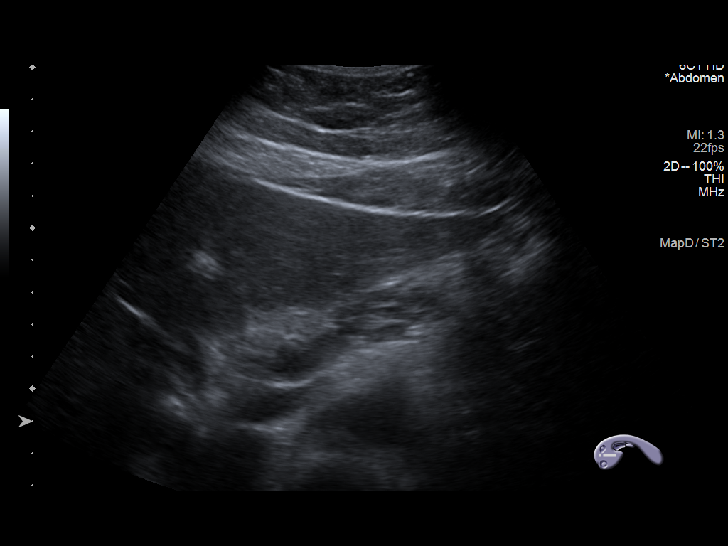
[im 59/95]
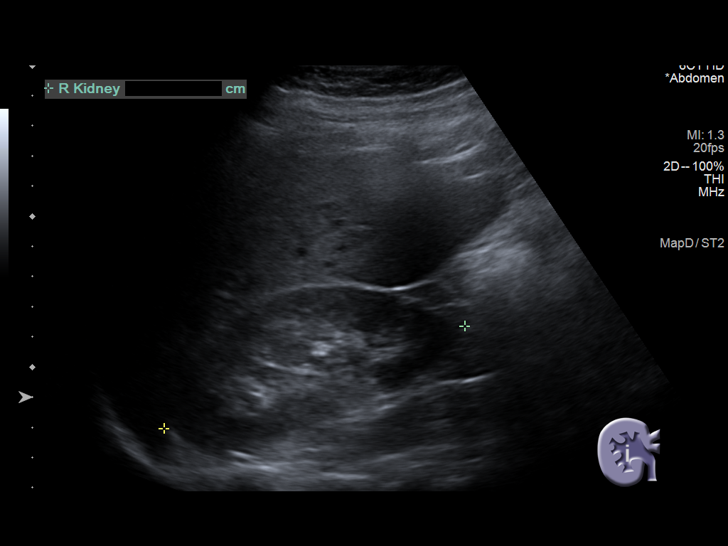
[im 63/95]
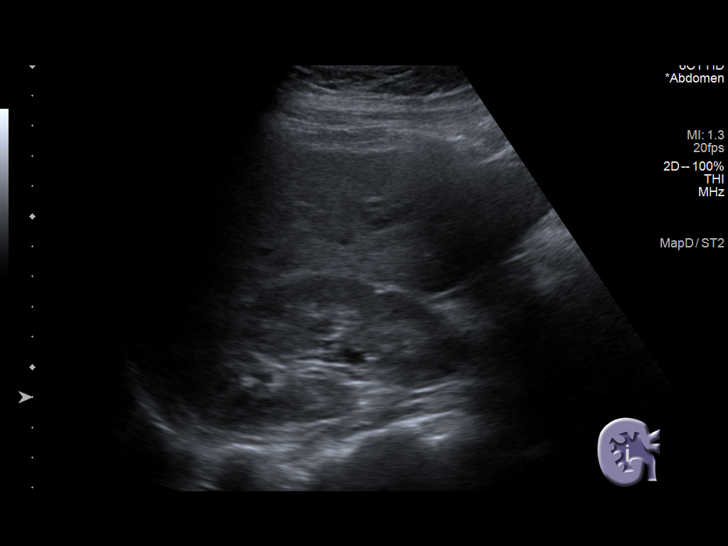
[im 71/95]
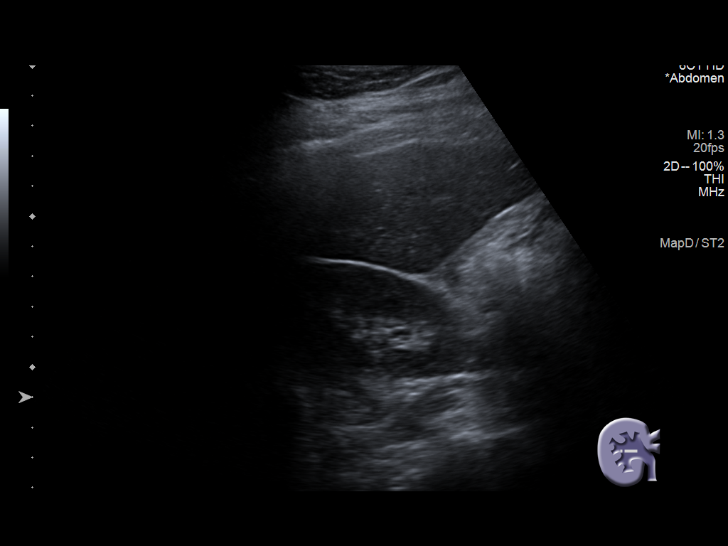
[im 79/95]
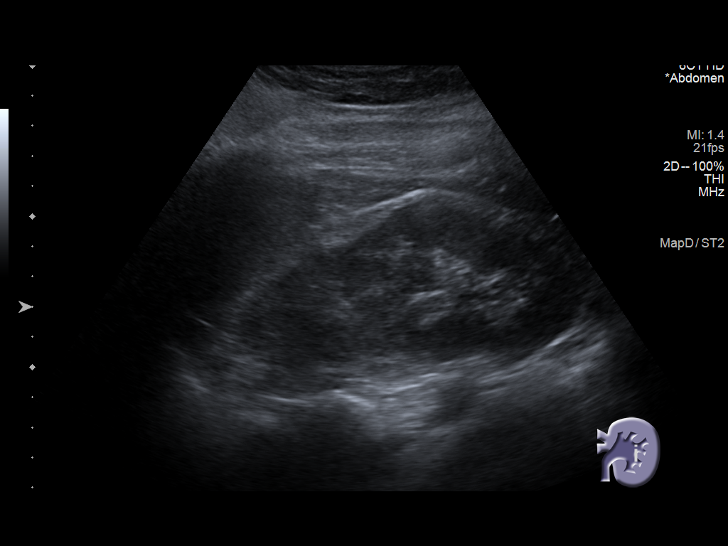
[im 87/95]
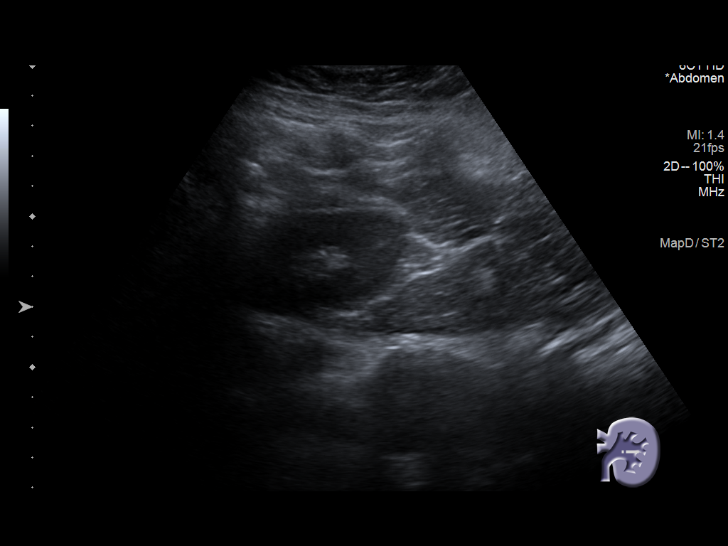
[im 95/95]
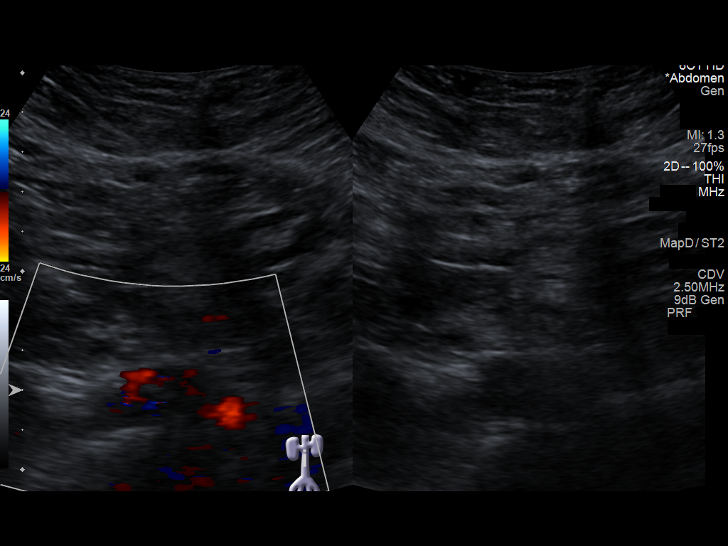

[14 of 25 positions shown; findings below may reference images not displayed]

FINDINGS: Gallbladder: No gallstones or wall thickening visualized. No
sonographic Murphy sign noted by sonographer.

Common bile duct: Diameter: 4 mm

Liver: No focal lesion identified. Within normal limits in
parenchymal echogenicity. Portal vein is patent on color Doppler
imaging with normal direction of blood flow towards the liver.

IVC: No abnormality visualized.

Pancreas: Visualized portion unremarkable.

Spleen: Size and appearance within normal limits.

Right Kidney: Length: 10.5 cm. Echogenicity within normal limits. No
mass or hydronephrosis visualized.

Left Kidney: Length: 11.6 cm. Echogenicity within normal limits. No
mass or hydronephrosis visualized.

Abdominal aorta: No aneurysm visualized.

Other findings: None.
IMPRESSION: Normal sonographic evaluation of the abdomen.

## 2020-07-09 IMAGING — US US THYROID
1 series · 14 of 25 positions shown · non-contrast
Comparison: None.

CLINICAL DATA: Enlarged thyroid

EXAM:
THYROID ULTRASOUND
TECHNIQUE: Ultrasound examination of the thyroid gland and adjacent soft
tissues was performed.

[Series 1: us thyroid · 0.06mm/px · 14 of 39 slices shown]
[im 1/39]
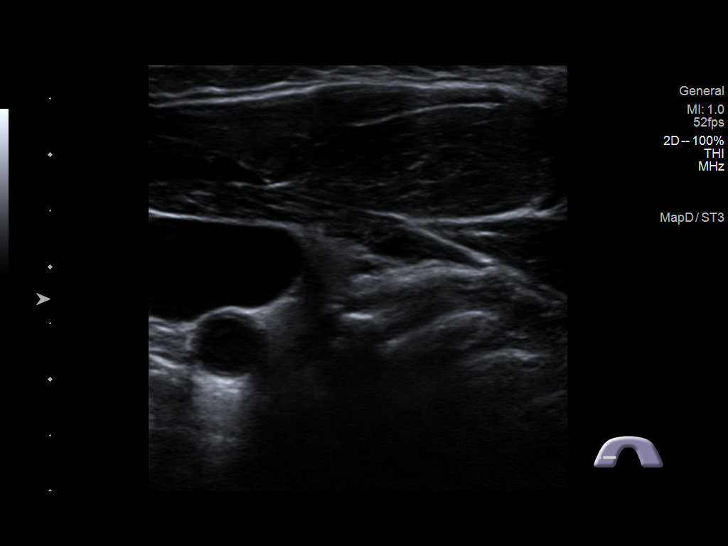
[im 4/39]
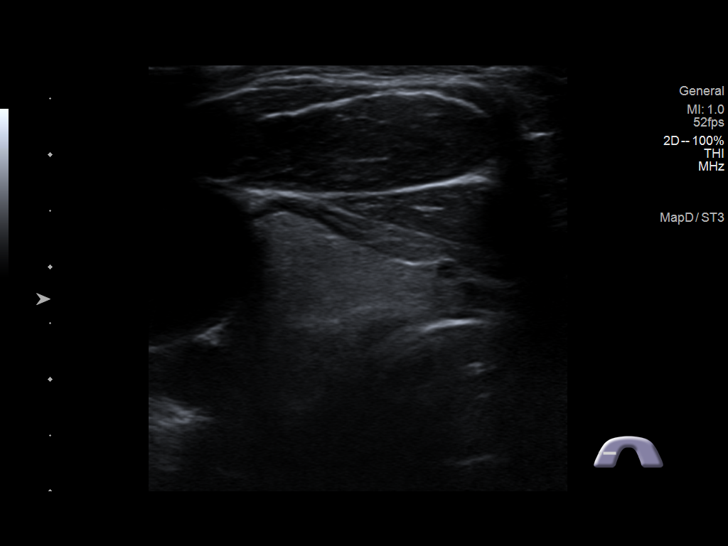
[im 7/39]
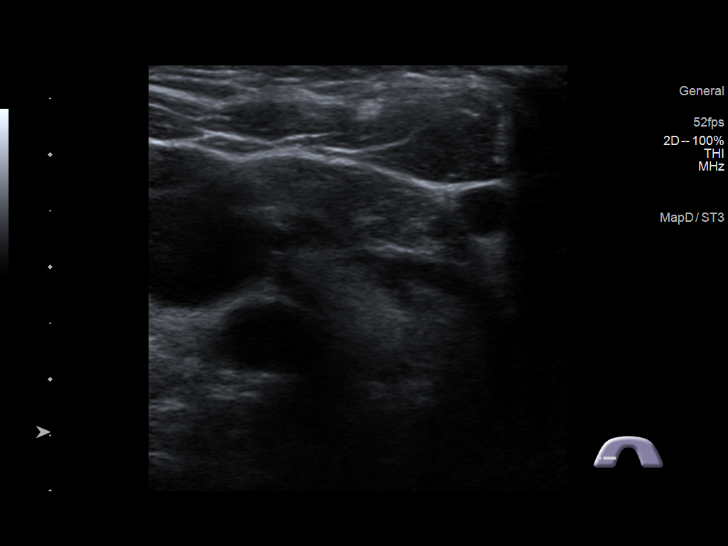
[im 10/39]
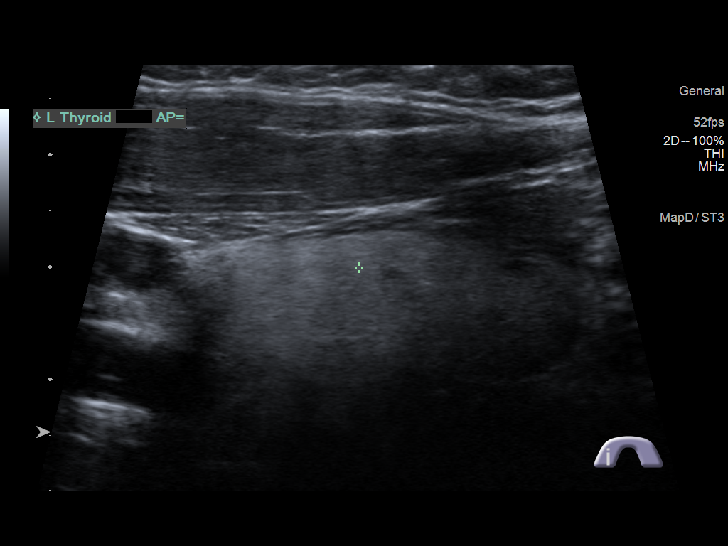
[im 13/39]
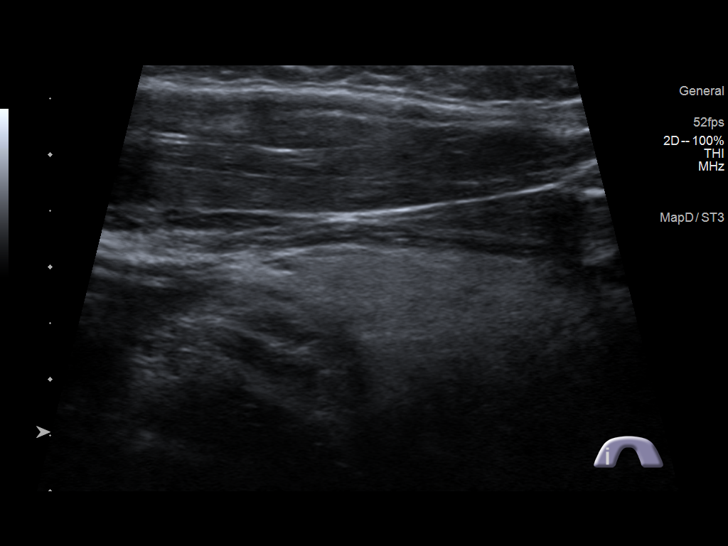
[im 15/39]
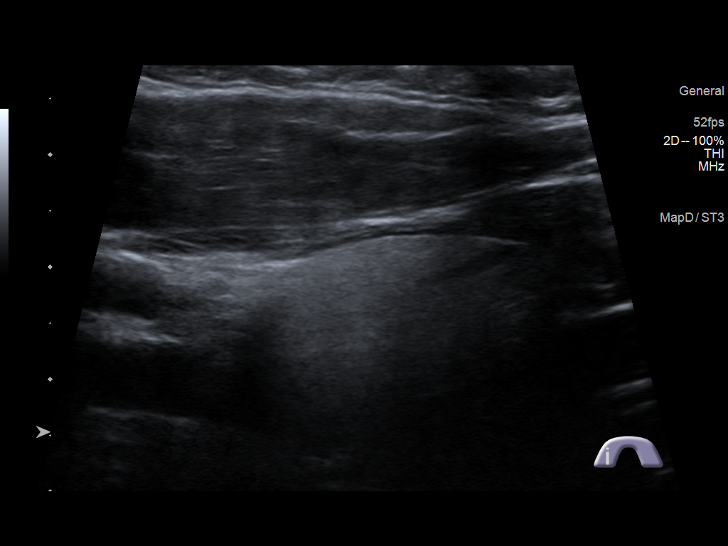
[im 18/39]
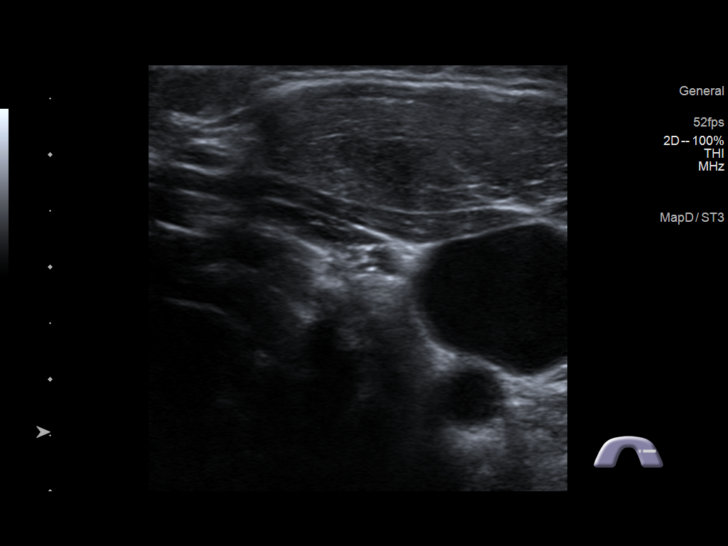
[im 21/39]
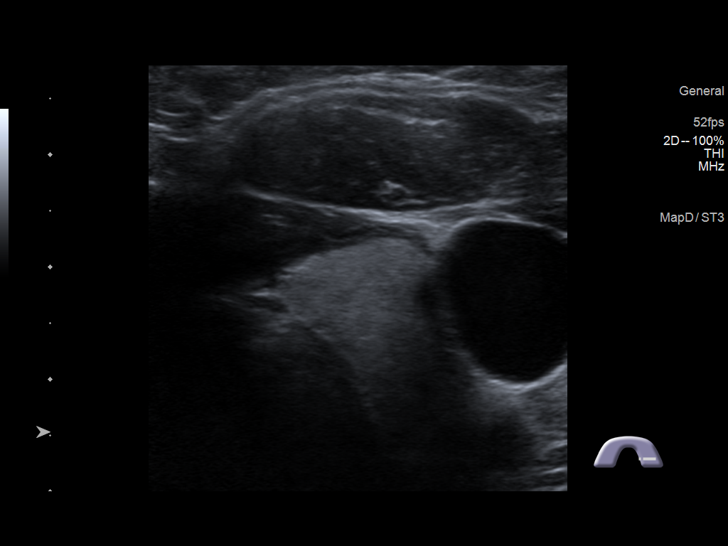
[im 24/39]
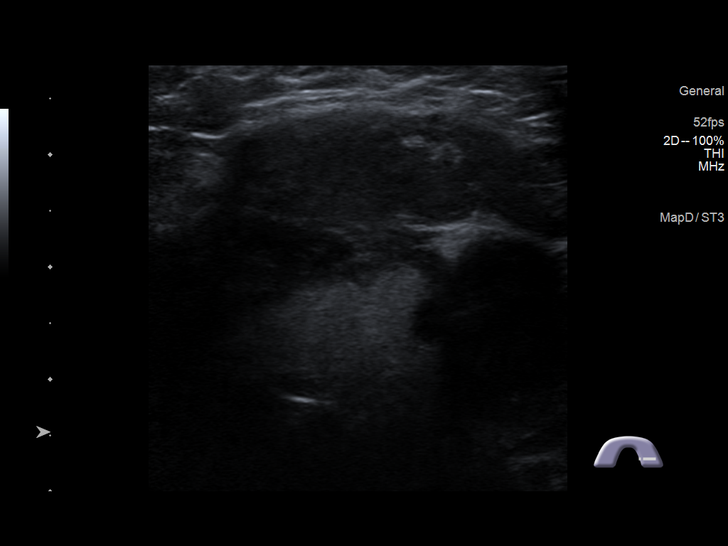
[im 26/39]
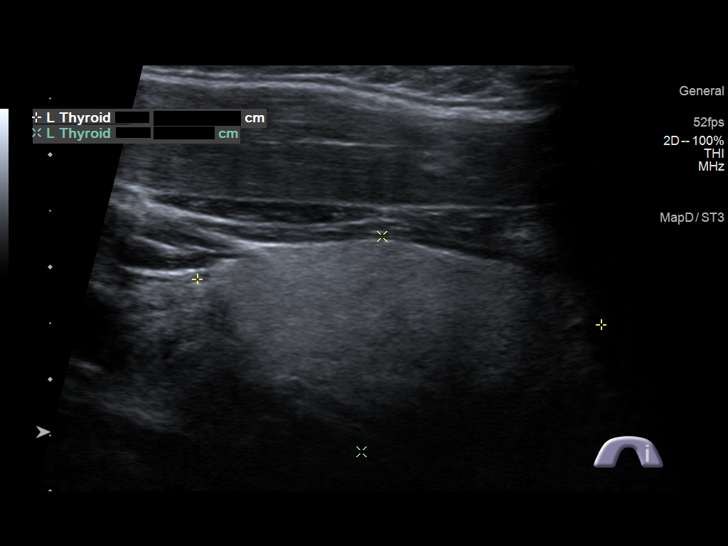
[im 29/39]
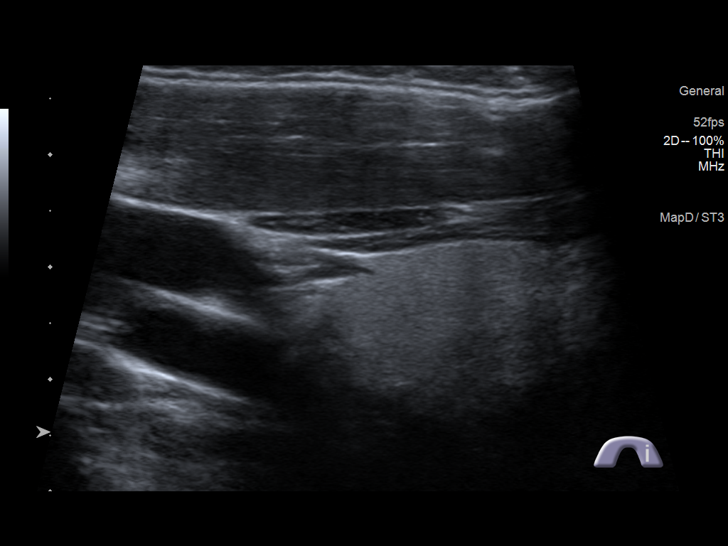
[im 32/39]
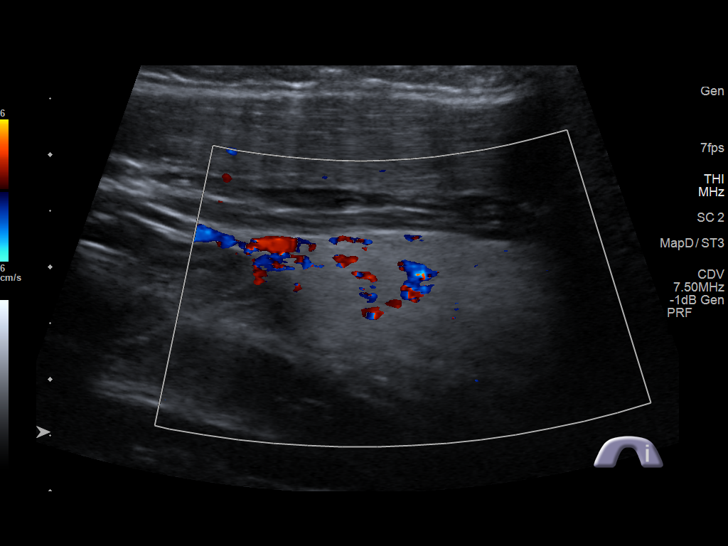
[im 35/39]
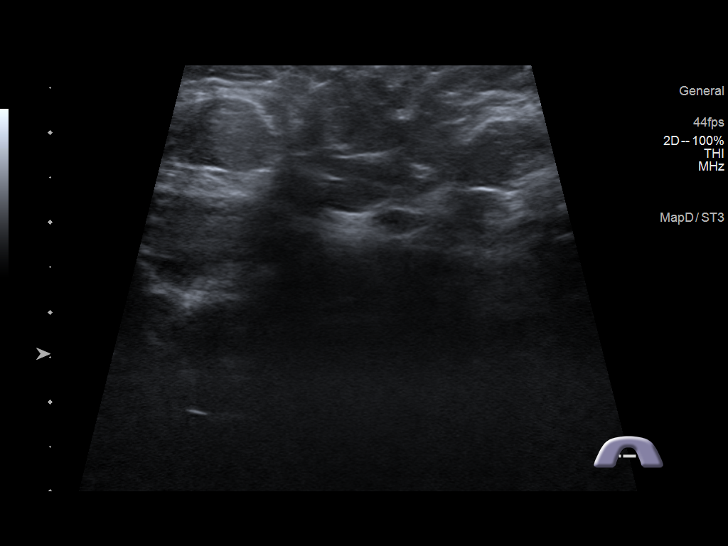
[im 39/39]
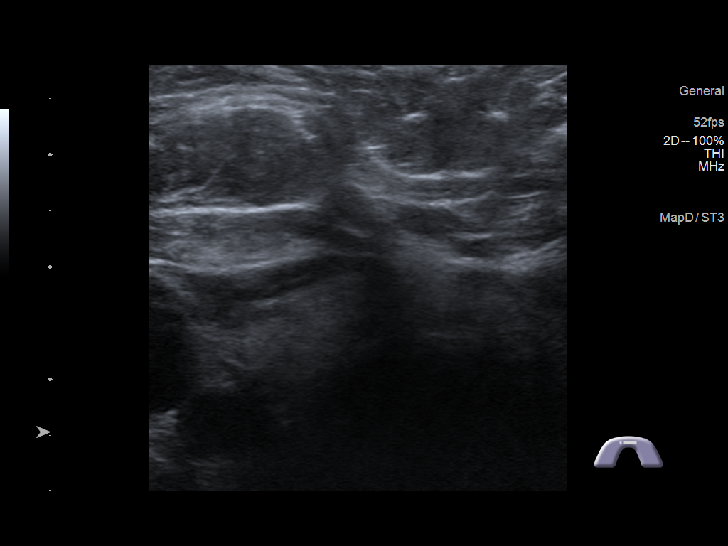

[14 of 25 positions shown; findings below may reference images not displayed]

FINDINGS: Parenchymal Echotexture: Normal

Isthmus: 0.7 cm

Right lobe: 3.9 x 1.9 x 1.6 cm

Left lobe: 3.6 x 1.9 x 1.3 cm

_________________________________________________________

Estimated total number of nodules >/= 1 cm: 0

Number of spongiform nodules >/=  2 cm not described below (TR1): 0

Number of mixed cystic and solid nodules >/= 1.5 cm not described
below (TR2): 0

_________________________________________________________

No discrete nodules are seen within the thyroid gland.
IMPRESSION: Normal thyroid ultrasound

The above is in keeping with the ACR TI-RADS recommendations - [HOSPITAL] [A3];[DATE].

## 2020-07-09 NOTE — Progress Notes (Signed)
No discrete nodules on thyroid U/S, so that is great news!

## 2020-07-09 NOTE — Progress Notes (Signed)
If pain gets worse before she gets in with GYN, she can go to the emergency department for rapid imaging.

## 2020-07-09 NOTE — Progress Notes (Signed)
Renal U/S is negative as well. She should follow-up with GYN since ultrasounds did not show anything conclusive.

## 2020-07-09 NOTE — Progress Notes (Signed)
U/S abdomen is normal, so no abdominal cause for pain.  Renal U/S

## 2020-07-14 ENCOUNTER — Other Ambulatory Visit: Payer: Self-pay | Admitting: *Deleted

## 2020-07-14 DIAGNOSIS — Z8742 Personal history of other diseases of the female genital tract: Secondary | ICD-10-CM

## 2020-07-14 NOTE — Progress Notes (Signed)
mb ref

## 2020-07-19 ENCOUNTER — Other Ambulatory Visit: Payer: Self-pay

## 2020-07-19 ENCOUNTER — Ambulatory Visit (INDEPENDENT_AMBULATORY_CARE_PROVIDER_SITE_OTHER): Payer: 59 | Admitting: Clinical

## 2020-07-19 DIAGNOSIS — F419 Anxiety disorder, unspecified: Secondary | ICD-10-CM | POA: Diagnosis not present

## 2020-07-19 DIAGNOSIS — F331 Major depressive disorder, recurrent, moderate: Secondary | ICD-10-CM | POA: Diagnosis not present

## 2020-07-19 NOTE — Progress Notes (Signed)
Virtual Visit via Video Note  I connected with Meghan Carter on 07/19/20 at  8:00 AM EDT by a video enabled telemedicine application and verified that I am speaking with the correct person using two identifiers.  Location: Patient: Home Provider: Office   I discussed the limitations of evaluation and management by telemedicine and the availability of in person appointments. The patient expressed understanding and agreed to proceed.  THERAPIST PROGRESS NOTE   Session Time: 8:00AM-8:45AM   Participation Level: Active   Behavioral Response: CasualAlertDepressed   Type of Therapy: Individual Therapy   Treatment Goals addressed: Coping   Interventions: CBT, DBT, Motivational Interviewing, Solution Focused, Strength-based and Supportive   Summary: [name] is a 40 y.o. female who presents with Depression and Anxiety.The OPT therapist worked with the patient for her ongoing OPT treatment. The OPT therapist utilized Motivational Interviewing to assist in creating therapeutic repore. 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 The OPT therapist utilized Cognitive Behavioral Therapy through cognitive restructuring as well as worked with the patient on coping strategies to assist moving forward after recent transitional changes. The OPT therapist worked with the patient on time management and staging with structure completion of task to reduce triggering and frustration. The OPT therapist worked with the patient on positive thinking. .   Suicidal/Homicidal: Nowithout intent/plan   Therapist Response: The OPT therapist worked with the patient for the patients scheduled session. The patient was engaged in her session and gave feedback in relation to triggers, symptoms, and behavior  responses over the past few weeks. The OPT therapist worked with the patient utilizing an in session Cognitive Behavioral Therapy exercise. The patient was responsive in the session and noted, " I still feel things went wrong at my job and the way I was let go I just cant go forward without speaking about". The OPT therapist worked with the patient on using protective factors and coping strategies as she continues working to work to adjust to changes in her life and focus on her future goals.. The OPT therapist will continue treatment work with the patient in her next scheduled session.   Plan: Return again in 2/3 weeks.   Diagnosis:      Axis I:  Recurrent episode, moderate, depressive disorder with anxiety                            Axis II: No diagnosis   I discussed the assessment and treatment plan with the patient. The patient was provided an opportunity to ask questions and all were answered. The patient agreed with the plan and demonstrated an understanding of the instructions.   The patient was advised to call back or seek an in-person evaluation if the symptoms worsen or if the condition fails to improve as anticipated.   I provided 45 minutes of non-face-to-face time during this encounter.   Lennox Grumbles, LCSW  07/19/2020

## 2020-07-20 ENCOUNTER — Ambulatory Visit: Payer: 59 | Admitting: Podiatry

## 2020-07-20 ENCOUNTER — Ambulatory Visit (HOSPITAL_COMMUNITY): Payer: 59 | Admitting: Clinical

## 2020-07-23 ENCOUNTER — Ambulatory Visit (INDEPENDENT_AMBULATORY_CARE_PROVIDER_SITE_OTHER): Payer: 59 | Admitting: Podiatry

## 2020-07-23 ENCOUNTER — Other Ambulatory Visit: Payer: Self-pay

## 2020-07-23 ENCOUNTER — Ambulatory Visit: Payer: 59 | Admitting: Podiatry

## 2020-07-23 DIAGNOSIS — M21621 Bunionette of right foot: Secondary | ICD-10-CM | POA: Diagnosis not present

## 2020-07-23 DIAGNOSIS — M722 Plantar fascial fibromatosis: Secondary | ICD-10-CM | POA: Diagnosis not present

## 2020-07-26 ENCOUNTER — Telehealth: Payer: Self-pay | Admitting: Podiatry

## 2020-07-26 NOTE — Telephone Encounter (Signed)
Pt would like an update on her referral to see the therapist through [facility]. Pt would also like to know if you can prescribe a cream to help with her bunions because they hurt. Please advise

## 2020-07-26 NOTE — Telephone Encounter (Signed)
I've sent the request to the PT. Typically will not hear for a few days

## 2020-07-26 NOTE — Progress Notes (Signed)
Subjective:   Patient ID: Meghan Carter, female   DOB: 40 y.o.   MRN: [id number]   HPI Patient states that her plantar fascial inflammation has worsened and states the brace seems to be a bother her and she refuses injection treatment.  Also stated she had an injection around the fifth metatarsal head right last year and that has stayed irritated for her   ROS      Objective:  Physical Exam  Neurovascular status intact with patient found to have discomfort in the plantar fascial left that seems to do better with the brace but she still gets pain and also some keratotic lesion around the fifth metatarsal head right     Assessment:  Appears to be more of a chronic fascial inflammation left along with inflammatory changes fifth MPJ right with keratotic lesion formation     Plan:  H&P reviewed both conditions.  For the left I am again to have her get an air fracture walker for complete immobilization order that on [place] along with ice therapy and for the right I debrided the lesion did not note any break in tissue and patient will be seen back to recheck

## 2020-07-28 ENCOUNTER — Other Ambulatory Visit: Payer: Self-pay | Admitting: Podiatry

## 2020-07-28 ENCOUNTER — Telehealth: Payer: Self-pay

## 2020-07-28 DIAGNOSIS — M722 Plantar fascial fibromatosis: Secondary | ICD-10-CM

## 2020-07-28 NOTE — Telephone Encounter (Signed)
Pt was returning a call from [place]. She would like a call back after 3;00pm today

## 2020-08-10 ENCOUNTER — Other Ambulatory Visit: Payer: Self-pay

## 2020-08-10 ENCOUNTER — Ambulatory Visit (INDEPENDENT_AMBULATORY_CARE_PROVIDER_SITE_OTHER): Payer: 59 | Admitting: Clinical

## 2020-08-10 DIAGNOSIS — F419 Anxiety disorder, unspecified: Secondary | ICD-10-CM

## 2020-08-10 DIAGNOSIS — F331 Major depressive disorder, recurrent, moderate: Secondary | ICD-10-CM | POA: Diagnosis not present

## 2020-08-10 NOTE — Progress Notes (Signed)
Virtual Visit via Video Note   I connected with [name] on 08/10/20 at  8:00 AM EDT by a video enabled telemedicine application and verified that I am speaking with the correct person using two identifiers.   Location: Patient: Home Provider: Office   I discussed the limitations of evaluation and management by telemedicine and the availability of in person appointments. The patient expressed understanding and agreed to proceed.   THERAPIST PROGRESS NOTE   Session Time: 8:00AM-8:40AM   Participation Level: Active   Behavioral Response: CasualAlertDepressed   Type of Therapy: Individual Therapy   Treatment Goals addressed: Coping   Interventions: CBT, DBT, Motivational Interviewing, Solution Focused, Strength-based and Supportive   Summary: Meghan Carter is a 40 y.o. female who presents with Depression and Anxiety.The OPT therapist worked with the patient for her ongoing OPT treatment. The OPT therapist utilized Motivational Interviewing to assist in creating therapeutic repore. The patient in the session was engaged and work in collaboration giving feedback about her triggers and symptoms over the past few weeks including adjusting to her new job. The OPT therapist utilized Cognitive Behavioral Therapy through cognitive restructuring as well as worked with the patient on coping strategies to assist moving forward from her prior job. The OPT therapist worked with the patient on time management and staging with structure completion of task to reduce triggering and frustration. The OPT therapist worked with the patient on positive thinking and continuing to implement coping strategies including going to the gym.   Suicidal/Homicidal: Nowithout intent/plan   Therapist Response: The OPT therapist worked with the patient for the patients scheduled session. The patient was engaged in her session and gave feedback in relation to triggers, symptoms, and behavior responses over the past  few weeks. The OPT therapist worked with the patient utilizing an in session Cognitive Behavioral Therapy exercise. The patient was responsive in the session and noted, " I struggle with my physical health and that has a impact on my depression". The patient notes that due to prior negative experiences with doctors and another recent negative experience with a doctor where she felt she was having an allergic reaction to a medication her dr gave her. The patient noted she expressed her concern but didn't feel like she was heard and felt like she was dismissed so she decided to stop the medication herself. The patient notes she is currently taking 800mg  Ibprofin pain reliever for her feet pain that she acquired from her primary care physician.The OPT therapist worked with the patient on using protective factors and coping strategies as she continues working to work to adjust to changes in her life and focus on her future goals. The OPT therapist will continue treatment work with the patient in her next scheduled session.   Plan: Return again in 2/3 weeks.   Diagnosis:      Axis I:  Recurrent episode, moderate, depressive disorder with anxiety                            Axis II: No diagnosis   I discussed the assessment and treatment plan with the patient. The patient was provided an opportunity to ask questions and all were answered. The patient agreed with the plan and demonstrated an understanding of the instructions.   The patient was advised to call back or seek an in-person evaluation if the symptoms worsen or if the condition fails to improve as anticipated.   I  provided 40 minutes of non-face-to-face time during this encounter.   Lennox Grumbles, LCSW   08/10/2020

## 2020-08-11 NOTE — Telephone Encounter (Signed)
Pt insurance does not cover PT at [facility]. Pt and Dr. Paulla Dolly informed.

## 2020-08-13 ENCOUNTER — Telehealth: Payer: Self-pay

## 2020-08-13 ENCOUNTER — Other Ambulatory Visit: Payer: Self-pay | Admitting: Nurse Practitioner

## 2020-08-13 NOTE — Telephone Encounter (Signed)
PT is calling asking about her provider Jarrett Soho, I advised Jarrett Soho was not coming back in Aug, Pt then states she needs a referral to GYN. I asked where she would like a referral to and she said somewhere in [place],. As the place in [place] did not take her insurance. I advised the pt we had sent three referrals out. I advised the pt to call her insurance company to see who excepts her insurance, she stated that she wanted to speak with the Dr as the Dr would know who takes her insurance, and that was not her job to call them. I advised that the providers did not know that information either. Pt wanted to speak to [facility] who does the referrals

## 2020-08-16 ENCOUNTER — Other Ambulatory Visit: Payer: Self-pay

## 2020-08-16 ENCOUNTER — Ambulatory Visit: Payer: 59 | Attending: Podiatry | Admitting: Physical Therapy

## 2020-08-16 ENCOUNTER — Encounter: Payer: Self-pay | Admitting: Physical Therapy

## 2020-08-16 DIAGNOSIS — M25672 Stiffness of left ankle, not elsewhere classified: Secondary | ICD-10-CM | POA: Diagnosis present

## 2020-08-16 DIAGNOSIS — M25571 Pain in right ankle and joints of right foot: Secondary | ICD-10-CM

## 2020-08-16 DIAGNOSIS — R2689 Other abnormalities of gait and mobility: Secondary | ICD-10-CM

## 2020-08-16 DIAGNOSIS — M25572 Pain in left ankle and joints of left foot: Secondary | ICD-10-CM

## 2020-08-16 MED ORDER — IBUPROFEN 800 MG PO TABS: 800.0000 mg | ORAL_TABLET | Freq: Three times a day (TID) | ORAL | 0 refills | Status: DC | PRN

## 2020-08-16 NOTE — Therapy (Addendum)
[facility] [place], [place], [address] Phone: [phone]   Fax:  [phone]  Physical Therapy Evaluation/ Discharge Summary  Patient Details  Name: Meghan Carter MRN: [id number] Date of Birth: Sep 12, 1980 Referring Provider (PT): Ila Mcgill, MD   Encounter Date: 08/16/2020   PT End of Session - 08/16/20 0908     Visit Number 1    Number of Visits 7    Date for PT Re-Evaluation 10/04/20    Authorization Type [employer]    PT Start Time 5366    PT Stop Time 0900    PT Time Calculation (min) 65 min    Activity Tolerance Patient tolerated treatment well    Behavior During Therapy [facility] for tasks assessed/performed             Past Medical History:  Diagnosis Date   Acute blood loss anemia 03/26/2015   Anemia    Anxiety    Atherosclerosis of arteries    Blood transfusion without reported diagnosis 2017   2 units, no reaction   Callus of foot 02/08/2011   Depression    Dysmenorrhea    Endometrial polyp 02/11/2014   Fecal occult blood test positive 01/27/2014   Fecal occult blood test positive 01/27/2014   Fibroid    High-tone pelvic floor dysfunction 03/17/2015   History of abnormal cervical Pap smear 02/11/2014   History of acute pancreatitis 03/31/2011   HSV (herpes simplex virus) infection    Hydrosalpinx    Bilateral   Internal hemorrhoids    Lichenification and lichen simplex chronicus 11/07/2010   Lower abdominal pain    Menorrhagia with regular cycle 01/27/2014   Menorrhagia with regular cycle 01/27/2014   Other fatigue    Pelvic pain in female 01/27/2014   Plantar fasciitis 02/08/2011   Rectal bleeding 01/27/2014   Rectal pain 03/19/2014   Severe anemia    Vaginal Pap smear, abnormal     Past Surgical History:  Procedure Laterality Date   CHROMOPERTUBATION  08/14/2017   Procedure: CHROMOPERTUBATION;  Surgeon: Governor Specking, MD;  Location: [facility];  Service: Gynecology;;   COLONOSCOPY N/A  03/23/2014   SLF: 1. The examined terminal ileum appeared to be normal. 2. The left colon is redundant 3. Moderate sized internal hemorrhoids   ECTOPIC PREGNANCY SURGERY Left 2005   FLEXIBLE SIGMOIDOSCOPY N/A 04/01/2015   SLF: rectal bleeding due to large internal hemorrhoids 2. anemia due to rectal bleeding/memses/ poor iron intake.   FLEXIBLE SIGMOIDOSCOPY N/A 03/27/2015   Procedure: FLEXIBLE SIGMOIDOSCOPY;  Surgeon: Danie Binder, MD;  Location: [facility];  Service: Endoscopy;  Laterality: N/A;   HEMORRHOID BANDING N/A 03/23/2014   Procedure: HEMORRHOID BANDING;  Surgeon: Danie Binder, MD;  Location: [facility];  Service: Endoscopy;  Laterality: N/A;   HEMORRHOID BANDING  03/27/2015   Procedure: HEMORRHOID BANDING;  Surgeon: Danie Binder, MD;  [facility]: [facility];  Service: Endoscopy;;   HEMORRHOID SURGERY N/A 01/11/2018   Procedure: EXTENSIVE HEMORRHOIDECTOMY;  Surgeon: Aviva Signs, MD;  Location: [facility];  Service: General;  Laterality: N/A;   HYSTEROSCOPY WITH D & C N/A 06/16/2014   Procedure: DILATATION AND CURETTAGE /HYSTEROSCOPY;  Surgeon: Jonnie Kind, MD;  Location: [facility];  Service: Gynecology;  Laterality: N/A;   LAPAROSCOPIC UNILATERAL SALPINGECTOMY Right 06/16/2014   Procedure: LAPAROSCOPIC UNILATERAL SALPINGECTOMY;  Surgeon: Jonnie Kind, MD;  Location: [facility];  Service: Gynecology;  Laterality: Right;   LAPAROSCOPY N/A 08/14/2017  Procedure: OPERATIVE LAPAROSCOPY ,LEFT SALPINGECTOMY, LYSIS OF ADHESIONS;  Surgeon: Governor Specking, MD;  Location: [facility];  Service: Gynecology;  Laterality: N/A;   POLYPECTOMY N/A 06/16/2014   Procedure: POLYPECTOMY (endometrial);  Surgeon: Jonnie Kind, MD;  Location: [facility];  Service: Gynecology;  Laterality: N/A;    There were no vitals filed for this visit.    Subjective Assessment - 08/16/20 0805     Subjective Patient reports 4 months since start of former job. Pain with prolonged standing, walking and first thing in  the morning. Patient also reports R lateral foot has painful spot that was given an injection and has not had any relief, irritated mostly with up walking on it with a shoe on. Patient also reports pelvic "rotation", low back and cervical pain.    Limitations Standing;Walking    How long can you stand comfortably? 62mn    Patient Stated Goals Get up without pain.    Currently in Pain? Yes    Pain Score 10-Worst pain ever   Min = 0/10 at rest   Pain Location Foot    Pain Orientation Left    Pain Descriptors / Indicators Burning;Tingling;Aching    Pain Type Chronic pain    Pain Onset More than a month ago    Aggravating Factors  standing, walking                [facility] - 08/16/20 0001       Assessment   Medical Diagnosis M72.2 Plantar fasciitis    Referring Provider (PT) NIla Mcgill MD    Next MD Visit TBD      Precautions   Precautions None      Restrictions   Weight Bearing Restrictions No      Balance Screen   Has the patient fallen in the past 6 months No    Has the patient had a decrease in activity level because of a fear of falling?  No    Is the patient reluctant to leave their home because of a fear of falling?  No      Home Environment   Living Environment Private residence    LTivolito enter    Entrance Stairs-Number of Steps 15      Prior Function   Vocation Full time employment    Vocation Requirements Standing, walking, lifting, carrying.      Observation/Other Assessments   Observations No apparent distress    Focus on Therapeutic Outcomes (FOTO)  47% (65% predicted)      Sensation   Light Touch Appears Intact      AROM   Right Ankle Dorsiflexion 4    Right Ankle Plantar Flexion 50    Right Ankle Inversion 33    Right Ankle Eversion 10    Left Ankle Dorsiflexion 0    Left Ankle Plantar Flexion 48    Left Ankle Inversion 28    Left Ankle Eversion 18      Strength   Right Ankle  Dorsiflexion 4/5    Right Ankle Plantar Flexion 4/5    Right Ankle Inversion 4/5    Right Ankle Eversion 4/5    Left Ankle Dorsiflexion 4+/5    Left Ankle Plantar Flexion 4/5    Left Ankle Inversion 4/5      Palpation   Palpation comment TTP plantar surface L foot as well as lateral R foot tender spot.      Ambulation/Gait  Ambulation/Gait Yes    Ambulation/Gait Assistance 7: Independent    Ambulation Distance (Feet) 100 Feet    Assistive device None    Gait Pattern Antalgic                        Objective measurements completed on examination: See above findings.       [place] Adult PT Treatment/Exercise - 08/16/20 0001       Ankle Exercises: Stretches   Plantar Fascia Stretch 30 seconds;2 reps    Plantar Fascia Stretch Limitations DF with strap    Other Stretch Toe flex/ext stretch 10x3sec ea      Ankle Exercises: Seated   Other Seated Ankle Exercises 4way with RED band x10ea                    PT Education - 08/16/20 0912     Education Details Results of assessment, FOTO survey results, POC, HEP    Person(s) Educated Patient    Methods Explanation;Demonstration;Verbal cues;Handout    Comprehension Verbalized understanding;Returned demonstration              PT Short Term Goals - 08/16/20 1350       PT SHORT TERM GOAL #1   Title Patient will be independent with initial HEP for symptom management.    Baseline No current exercise program.    Time 2    Period Weeks    Status New    Target Date 08/30/20      PT SHORT TERM GOAL #2   Title Patient will reports decrased pain after work  of no more than 6/10 L foot.    Baseline 10/10 foot pain after prolonged walking/standing including work.    Time 3    Period Weeks    Status New    Target Date 09/06/20               PT Long Term Goals - 08/16/20 1352       PT LONG TERM GOAL #1   Title Patient will be independent with final HEP and progression to continue to reduce  pain and symptoms after discharge.    Baseline No current exercise program    Time 6    Period Weeks    Status New    Target Date 10/04/20      PT LONG TERM GOAL #2   Title FOTO score will increase to 65% as predicted for improved perception of functional abilities.    Baseline 47%    Time 6    Period Weeks    Status New    Target Date 10/04/20      PT LONG TERM GOAL #3   Title Patient will report no more than 3/10 foot pain when standing first thing in the morning.    Baseline 10/10 when just out of bed.    Time 6    Period Weeks    Status New    Target Date 10/04/20      PT LONG TERM GOAL #4   Title Patient will be able to complete a work shift with no more than 3/10 pain and able to self manage decreased pain upon return home.    Baseline 10/10 pain after work day.    Time 6    Period Weeks    Status New    Target Date 10/04/20  Plan - 08/16/20 0920     Clinical Impression Statement 40 yo femal referred to OPPT with L plantar fasciits.  Patient also c/o chronic lateral R foot pain with palpable protrusion and chronic LBP, pelvic rotation and cervical pain.  Will focus on plantar faciitis and trasition to back/neck issues with new referral as needed in the future. Patient presents to PT evaluation with antalgic gait, decreased R ankle ROM, fairly strong and tenderness at typical plantar heel into ball of foot.  Patient responded well to manual treatment, given patient ed to warm foot with motion and massage before getting out of bed in the morning and use ice to calm at the end of the day.  Patient will benefit from skilled PT to address deficits and maximize prolonged mobility with decreased pain.    Personal Factors and Comorbidities Comorbidity 1    Comorbidities Chronic LBP.    Examination-Participation Restrictions Cleaning;Occupation;Laundry;Meal Prep;Community Activity    Stability/Clinical Decision Making Stable/Uncomplicated    Clinical  Decision Making Low    Rehab Potential Good    PT Frequency 1x / week    PT Duration 6 weeks    PT Treatment/Interventions ADLs/Self Care Home Management;Aquatic Therapy;Cryotherapy;Moist Heat;Ultrasound;Gait training;Stair training;Functional mobility training;Therapeutic activities;Therapeutic exercise;Balance training;Neuromuscular re-education;Patient/family education;Manual techniques;Passive range of motion;Dry needling;Taping;Vasopneumatic Device    PT Next Visit Plan Assess affects of HEP, manual treatment as appropriate, stretching/stability/balance.    PT Home Exercise Plan [email or web address]    Consulted and Agree with Plan of Care Patient             Patient will benefit from skilled therapeutic intervention in order to improve the following deficits and impairments:  Abnormal gait, Decreased balance, Decreased endurance, Difficulty walking, Decreased range of motion, Decreased activity tolerance, Decreased strength, Pain  Visit Diagnosis: Pain in left ankle and joints of left foot  Pain in right ankle and joints of right foot  Stiffness of left ankle, not elsewhere classified  Other abnormalities of gait and mobility     Problem List Patient Active Problem List   Diagnosis Date Noted   Enlarged thyroid 06/29/2020   Right ear impacted cerumen 06/29/2020   Screening due 06/29/2020   Constipation 06/29/2020   Annual physical exam 01/23/2019   Recurrent major depressive disorder, in partial remission ([place]) 01/14/2019   Depression, major, single episode, moderate ([place]) 11/19/2018   Anxiety 11/19/2018   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 [address] [place], [place], [address] Phone: [phone]   Fax:  [phone]  Name: Meghan Carter MRN: [id number] Date of Birth: [birth year]  PHYSICAL THERAPY DISCHARGE SUMMARY  Visits from Start of Care: 1  Current functional level related to goals / functional outcomes: Unable to assess   Remaining deficits: Unable to assess   Education / Equipment: HEP   Patient agrees to discharge. Patient goals were not met. Patient is being discharged due to not returning since the last visit.  Vanessa Norton, PT, DPT 10/15/20 11:21 AM

## 2020-08-16 NOTE — Patient Instructions (Signed)
Access Code: [email or web address] URL: [email or web address] Date: 08/16/2020 Prepared by: Pollyann Samples  Program Notes Warm foot before getting out of bed.  Ice with water bottle roll or ice cube when irritated and will be able to rest (not before activity).   Exercises Long Sitting Calf Stretch with Strap - 1 x daily - 7 x weekly - 3 sets - 10 reps - 30sec hold Seated Toe Flexion Extension PROM - 1 x daily - 7 x weekly - 3 sets - 10 reps - 3sec hold Long Sitting Ankle Eversion with Resistance - 1 x daily - 7 x weekly - 3 sets - 10 reps Long Sitting Ankle Dorsiflexion with Anchored Resistance - 1 x daily - 7 x weekly - 3 sets - 10 reps Long Sitting Ankle Plantar Flexion with Resistance - 1 x daily - 7 x weekly - 3 sets - 10 reps Long Sitting Ankle Inversion with Resistance - 1 x daily - 7 x weekly - 3 sets - 10 reps

## 2020-08-27 ENCOUNTER — Ambulatory Visit: Payer: 59

## 2020-08-31 ENCOUNTER — Telehealth (HOSPITAL_COMMUNITY): Payer: Self-pay | Admitting: Clinical

## 2020-08-31 ENCOUNTER — Ambulatory Visit (HOSPITAL_COMMUNITY): Payer: 59 | Admitting: Clinical

## 2020-08-31 ENCOUNTER — Other Ambulatory Visit: Payer: Self-pay

## 2020-08-31 NOTE — Telephone Encounter (Signed)
The patient did not respond to attempts to contact for scheduled appointment.

## 2020-09-03 ENCOUNTER — Other Ambulatory Visit: Payer: Self-pay

## 2020-09-03 ENCOUNTER — Encounter: Payer: 59 | Admitting: Physical Therapy

## 2020-09-03 ENCOUNTER — Ambulatory Visit (INDEPENDENT_AMBULATORY_CARE_PROVIDER_SITE_OTHER): Payer: 59 | Admitting: Clinical

## 2020-09-03 DIAGNOSIS — F419 Anxiety disorder, unspecified: Secondary | ICD-10-CM | POA: Diagnosis not present

## 2020-09-03 DIAGNOSIS — F331 Major depressive disorder, recurrent, moderate: Secondary | ICD-10-CM
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
## 2020-09-10 ENCOUNTER — Ambulatory Visit: Payer: 59

## 2020-09-14 ENCOUNTER — Other Ambulatory Visit: Payer: Self-pay | Admitting: Nurse Practitioner

## 2020-09-15 ENCOUNTER — Other Ambulatory Visit: Payer: Self-pay | Admitting: Nurse Practitioner

## 2020-09-15 MED ORDER — IBUPROFEN 800 MG PO TABS: 800.0000 mg | ORAL_TABLET | Freq: Three times a day (TID) | ORAL | 0 refills | Status: DC | PRN

## 2020-09-15 NOTE — Telephone Encounter (Signed)
No this was just PRN. I sent in a refill if she is still having pain, but if pain is resolved she doesn't need to be taking it. In fact, taking it all the time when you don't need it can cause ulcers.

## 2020-09-17 ENCOUNTER — Ambulatory Visit: Payer: 59

## 2020-09-28 ENCOUNTER — Ambulatory Visit (HOSPITAL_COMMUNITY): Payer: 59 | Admitting: Clinical

## 2020-09-28 ENCOUNTER — Telehealth: Payer: Self-pay

## 2020-09-28 ENCOUNTER — Encounter: Payer: Self-pay | Admitting: Nurse Practitioner

## 2020-09-28 ENCOUNTER — Other Ambulatory Visit: Payer: Self-pay | Admitting: Nurse Practitioner

## 2020-09-28 DIAGNOSIS — R1030 Lower abdominal pain, unspecified: Secondary | ICD-10-CM

## 2020-09-28 NOTE — Telephone Encounter (Signed)
I sent in order for U/S pelvic with transvaginal.

## 2020-09-28 NOTE — Telephone Encounter (Signed)
Pt very adamant that she speak to Legrand Como personally. Would not give me any information. Pt has also sent MyChart.

## 2020-09-28 NOTE — Telephone Encounter (Signed)
Patient called was told we have a GYN doctor in our office, she asked for nurse to give her a call.

## 2020-10-07 ENCOUNTER — Ambulatory Visit (HOSPITAL_COMMUNITY): Admission: RE | Admit: 2020-10-07 | Payer: 59 | Source: Ambulatory Visit

## 2020-10-07 ENCOUNTER — Other Ambulatory Visit: Payer: Self-pay

## 2020-10-07 ENCOUNTER — Ambulatory Visit (HOSPITAL_COMMUNITY)
Admission: RE | Admit: 2020-10-07 | Discharge: 2020-10-07 | Disposition: A | Discharge status: Home / Self Care | Payer: 59 | Source: Ambulatory Visit | Attending: Nurse Practitioner | Admitting: Nurse Practitioner

## 2020-10-07 DIAGNOSIS — R1030 Lower abdominal pain, unspecified: Secondary | ICD-10-CM | POA: Insufficient documentation

## 2020-10-08 NOTE — Progress Notes (Signed)
She has a uterine fibroid on her ultrasound. She should see GYN to discuss treatment options.

## 2020-10-15 ENCOUNTER — Ambulatory Visit (HOSPITAL_COMMUNITY): Payer: 59 | Admitting: Clinical

## 2020-10-21 ENCOUNTER — Encounter: Payer: Self-pay | Admitting: Nurse Practitioner

## 2020-10-21 NOTE — Telephone Encounter (Signed)
Can you see what she wants?

## 2020-10-22 ENCOUNTER — Telehealth: Payer: Self-pay | Admitting: Nurse Practitioner

## 2020-10-22 ENCOUNTER — Encounter: Payer: Self-pay | Admitting: Nurse Practitioner

## 2020-10-22 NOTE — Telephone Encounter (Signed)
Left message

## 2020-10-22 NOTE — Telephone Encounter (Signed)
Please call the pt ---but look at the Mychart msg first

## 2020-10-22 NOTE — Telephone Encounter (Signed)
Pt is returning your call

## 2020-10-25 ENCOUNTER — Telehealth: Payer: Self-pay

## 2020-10-25 NOTE — Telephone Encounter (Signed)
I talked with Meghan Carter several times Friday pm.  The is upset and worried she has proteine in her urin per the [facility] MD.  She said she is worried about Kidney Failure. I told her Jonetta Osgood NP was not in the office this pm but I would call the MD on call which is Dr Posey Pronto and have him call her.  I told her to keep her line open and he would call her within about 15 min.  Dr Posey Pronto messages me and said she did not answer the phone. I see in the notes [name] and [name] both Nursing Staff from our office tried to call her as well and she did not answer the call. She noted in a aMyCHart message she is upset the [facility] is not returning her calls.  Please read documentation in file.

## 2020-10-25 NOTE — Telephone Encounter (Signed)
Left message and replied on MyChart. Awaiting a reply or a call back.

## 2020-10-27 ENCOUNTER — Telehealth: Payer: Self-pay

## 2020-10-27 NOTE — Telephone Encounter (Signed)
I had a voicemail from pt to call her in regards to the message I left her on Monday. She asked why no one ever returns her calls and if she should go ahead and find a practice that actually cares for their patients. I asked her what her concerns were and she said that the up front staff should have known that I leave at lunch on Friday and that was not voiced to her and that was very disrespectful. She also said that when I called her and left her a message I should have waited here for her return call back. I informed her that several people have tried calling her including a provider and they have left several messages as well. She said she wants you to call her today before you leave and it needs to be today.

## 2020-10-28 ENCOUNTER — Telehealth: Payer: Self-pay

## 2020-10-28 NOTE — Telephone Encounter (Signed)
I talked to patient in detail regarding her problem from Friday 9/16 needing an appointment and not being able to get this scheduled.  In the notes you will see 2 nurses tried to call her back with no answer and I called her after hours.  She answered and was very anxious that she be called back Friday night by a MD.  She said we were ignoring her calls.  I assured her that is not the case but we are having high volume of calls and will be happy to always call her back.  I called Dr Posey Pronto after hours and he called her since she did say this was a emergency and she was afraid her Kidneys were going to shut down.  I also told her the Emergency was a good option since it was the weekend if she felt this was a emergency.  She did not answer Dr Serita Grit call.  He left a message for her to call the office on Monday AM 9/19 to schedule appointment to address the issue she is having.  She call Wednesday 9/21 5pm demanding I call her back that pm. Meghan Carter our nurse tried to talk with her.  She demanded again that I call her Wednesday pm.  I received the message at 5:04pm.  I called her and looked at the schedules to work her in.  We were on the phone about 20 min trying to find a appointment time that she could work into her schedule. I told her to come in at [place] on Thursday 9/22.  She said she can not do that.  I told her if this is a Emergence she needed to be seen.  She said she can only come after 3:30.  I let her know our schedules were very full but I would check the schedules and give her the 1st opening we have after 3:30 pm.  I scheduled her on 10/4 in the pm.  She said she has My Chart and will be able to see the appointment when I book it since it is very hard for her to have phone access to answer calls.  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 I did let her know we need the lab report from the MD that told her it was a  Emergency that the urin issue be taken care of ASAP.  The ER and UC is other options if this is a Emergency.

## 2020-10-31 NOTE — Telephone Encounter (Signed)
Talked with pt and offered 8am appt.  She said she can not come in the following am.  I have scheduled her for next open time.  Pt is aware via Mychart

## 2020-11-02 ENCOUNTER — Other Ambulatory Visit: Payer: Self-pay

## 2020-11-02 ENCOUNTER — Ambulatory Visit (INDEPENDENT_AMBULATORY_CARE_PROVIDER_SITE_OTHER): Payer: 59 | Admitting: Clinical

## 2020-11-02 ENCOUNTER — Telehealth (HOSPITAL_COMMUNITY): Payer: Self-pay | Admitting: *Deleted

## 2020-11-02 ENCOUNTER — Telehealth (HOSPITAL_COMMUNITY): Payer: Self-pay | Admitting: Clinical

## 2020-11-02 DIAGNOSIS — F419 Anxiety disorder, unspecified: Secondary | ICD-10-CM

## 2020-11-02 DIAGNOSIS — F331 Major depressive disorder, recurrent, moderate: Secondary | ICD-10-CM | POA: Diagnosis not present

## 2020-11-02 NOTE — Progress Notes (Signed)
Virtual Visit via Telephone Note  I connected with Meghan Carter on 11/02/20 at  2:00 PM EDT by telephone and verified that I am speaking with the correct person using two identifiers.  Location: Patient: Home Provider: Office   I discussed the limitations, risks, security and privacy concerns of performing an evaluation and management service by telephone and the availability of in person appointments. I also discussed with the patient that there may be a patient responsible charge related to this service. The patient expressed understanding and agreed to proceed.   THERAPIST PROGRESS NOTE   Session Time: 2:00PM- 2:40PM   Participation Level: Active   Behavioral Response: CasualAlertDepressed   Type of Therapy: Individual Therapy   Treatment Goals addressed: Coping   Interventions: CBT, DBT, Motivational Interviewing, Solution Focused, Strength-based and Supportive   Summary: Meghan Carter is a 40 y.o. female who presents with Depression and Anxiety.The OPT therapist worked with the patient for her ongoing OPT treatment. The OPT therapist utilized Motivational Interviewing to assist in creating therapeutic repore. The patient in the session was engaged and work in collaboration giving feedback about her triggers and symptoms over the past few weeks including ongoing adjusting to her new job. The OPT therapist utilized Cognitive Behavioral Therapy through cognitive restructuring as well as worked with the patient on coping strategies. The OPT therapist worked with the patient on time management and managing frustration through not holding on to things once she has done everything within her control in a situation. The OPT therapist worked with the patient on positive thinking to assist with the patients recovery from prior negative experiences with medical providers.The patient continues to have stress/concern around her medical insurance coverage.   Suicidal/Homicidal: Nowithout  intent/plan   Therapist Response: The OPT therapist worked with the patient for the patients scheduled session. The patient was engaged in her session and gave feedback in relation to triggers, symptoms, and behavior responses over the past few weeks. The OPT therapist worked with the patient utilizing an in session Cognitive Behavioral Therapy exercise. The patient was responsive in the session and noted, " I am thankful for getting a job and I have benefits and a $3 raise". The patient continued in this session over viewing the impact of negative experiences with doctors. 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   Plan: Return again in 2/3 weeks.   Diagnosis:      Axis I:  Recurrent episode, moderate, depressive disorder with anxiety                            Axis II: No diagnosis   I discussed the assessment and treatment plan with the patient. The patient was provided an opportunity to ask questions and all were answered. The patient agreed with the plan and demonstrated an understanding of the instructions.   The patient was advised to call back or seek an in-person evaluation if the symptoms worsen or if the condition fails to improve as anticipated.   I provided 40 minutes of non-face-to-face time during this encounter.   Lennox Grumbles, LCSW   11/02/2020

## 2020-11-02 NOTE — Telephone Encounter (Signed)
The patient contacted staff to note cancellation of scheduled appointment set initially for 2PM today

## 2020-11-02 NOTE — Telephone Encounter (Signed)
Patient called stating she would like to cancel her appt due to her insurance not verifying with her yet if they will pay for the visit. Per pt she do not want to take the risk so she is needing to cancel her appt for today. Per pt she text Coralyn Mark (provider) informing him she wont be able to do her appt for today due to concerns with her insurance.

## 2020-11-09 ENCOUNTER — Ambulatory Visit: Payer: 59 | Admitting: Internal Medicine

## 2020-12-06 ENCOUNTER — Other Ambulatory Visit: Payer: Self-pay | Admitting: Nurse Practitioner

## 2020-12-07 MED ORDER — IBUPROFEN 800 MG PO TABS: 800.0000 mg | ORAL_TABLET | Freq: Three times a day (TID) | ORAL | 0 refills | Status: DC | PRN

## 2020-12-28 ENCOUNTER — Other Ambulatory Visit: Payer: Self-pay

## 2020-12-28 ENCOUNTER — Encounter: Payer: Self-pay | Admitting: Nurse Practitioner

## 2020-12-28 ENCOUNTER — Ambulatory Visit: Payer: BC Managed Care – PPO | Admitting: Nurse Practitioner

## 2020-12-28 VITALS — BP 123/79 | HR 94 | Ht 64.0 in | Wt 211.0 lb

## 2020-12-28 DIAGNOSIS — D219 Benign neoplasm of connective and other soft tissue, unspecified: Secondary | ICD-10-CM

## 2020-12-28 DIAGNOSIS — F321 Major depressive disorder, single episode, moderate: Secondary | ICD-10-CM

## 2020-12-28 MED ORDER — ESCITALOPRAM OXALATE 5 MG PO TABS: 5.0000 mg | ORAL_TABLET | Freq: Every day | ORAL | 3 refills | Status: DC

## 2020-12-28 NOTE — Patient Instructions (Addendum)
Follow up January for Annual exam  Take lexapro 5mg  daily for depression. Use tylenol 650 mg as needed for pain  It is important that you exercise regularly at least 30 minutes 5 times a week.  Think about what you will eat, plan ahead. Choose " clean, green, fresh or frozen" over canned, processed or packaged foods which are more sugary, salty and fatty. 70 to 75% of food eaten should be vegetables and fruit. Three meals at set times with snacks allowed between meals, but they must be fruit or vegetables. Aim to eat over a 12 hour period , example 7 am to 7 pm, and STOP after  your last meal of the day. Drink water,generally about 64 ounces per day, no other drink is as healthy. Fruit juice is best enjoyed in a healthy way, by EATING the fruit.  Thanks for choosing [facility], we consider it a privelige to serve you.

## 2020-12-29 DIAGNOSIS — D219 Benign neoplasm of connective and other soft tissue, unspecified: Secondary | ICD-10-CM | POA: Insufficient documentation

## 2020-12-29 NOTE — Assessment & Plan Note (Signed)
Importance of diet and regular vigorous excercise 300 minutes 5 days a week discussed with PT.

## 2020-12-29 NOTE — Assessment & Plan Note (Addendum)
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  Still having pelvic pain, offered referral to GYN pt refused.  Use tylenol as needed for pain.

## 2020-12-29 NOTE — Progress Notes (Signed)
   Meghan Carter     MRN: [id number]      DOB: 07/25/1980   HPI [name] is here for follow up and re-evaluation of chronic medical conditions, medication management and review of any available recent lab and radiology data.  Preventive health is updated, specifically Immunization.   Questions or concerns regarding consultations or procedures which the PT has had in the interim are  addressed. The PT denies any adverse reactions to current medications since the last visit.   Pt c/o of depression , she was previously on lexapro 10mg . She would like to restart lexapro today but on a lower dose. 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   Patient stated that she is still having pelvic pain, goes to the bathroom more frequently, wants to know treatment options for fibroid he does not want to speak with a OBGYN about her fibroid  ROS Denies recent fever or chills. Denies sinus pressure, nasal congestion, ear pain or sore throat. Denies chest congestion, productive cough or wheezing. Denies chest pains, palpitations and leg swelling Denies abdominal pain, nausea, vomiting,diarrhea or constipation.   Denies dysuria,  hesitancy or incontinence. Has Frequent urination, pelvic pain Denies headaches, seizures, numbness, or tingling. Has depression, anxiety or insomnia. Denies skin break down or rash.   PE  BP 123/79 (BP Location: Right Arm, Patient Position: Sitting, Cuff Size: Large)   Pulse 94   Ht 5\' 4"  (1.626 m)   Wt 211 lb (95.7 kg)   SpO2 97%   BMI 36.22 kg/m   Patient alert and oriented and in no cardiopulmonary distress.  HEENT: No facial asymmetry, EOMI,     Neck supple .  Chest: Clear to auscultation bilaterally.  CVS: S1, S2 no murmurs, no S3.Regular rate.  ABD: Soft non tender.   Ext: No edema  MS: Adequate ROM spine, shoulders, hips and knees.  Skin: Intact, no ulcerations or rash noted.  Psych:  Good eye contact, normal affect. Memory intact not anxious or depressed appearing.  CNS: CN 2-12 intact, power,  normal throughout.no focal deficits noted.   Assessment & Plan

## 2020-12-29 NOTE — Assessment & Plan Note (Signed)
Lexapro 5mg  daily. Pt educated on the need not to stop taking med abruptly.

## 2021-01-03 ENCOUNTER — Encounter: Payer: Self-pay | Admitting: Nurse Practitioner

## 2021-01-04 DIAGNOSIS — Z0279 Encounter for issue of other medical certificate: Secondary | ICD-10-CM

## 2021-01-06 ENCOUNTER — Ambulatory Visit: Payer: 59 | Admitting: Nurse Practitioner

## 2021-01-21 ENCOUNTER — Emergency Department (HOSPITAL_COMMUNITY)
Admission: EM | Admit: 2021-01-21 | Discharge: 2021-01-21 | Disposition: A | Discharge status: Home / Self Care | Payer: BC Managed Care – PPO | Attending: Emergency Medicine | Admitting: Emergency Medicine

## 2021-01-21 DIAGNOSIS — M7918 Myalgia, other site: Secondary | ICD-10-CM | POA: Insufficient documentation

## 2021-01-21 DIAGNOSIS — F17210 Nicotine dependence, cigarettes, uncomplicated: Secondary | ICD-10-CM | POA: Insufficient documentation

## 2021-01-21 DIAGNOSIS — R519 Headache, unspecified: Secondary | ICD-10-CM | POA: Insufficient documentation

## 2021-01-21 DIAGNOSIS — M791 Myalgia, unspecified site: Secondary | ICD-10-CM

## 2021-01-21 DIAGNOSIS — Y92410 Unspecified street and highway as the place of occurrence of the external cause: Secondary | ICD-10-CM | POA: Diagnosis not present

## 2021-01-21 MED ORDER — METHOCARBAMOL 500 MG PO TABS: 500.0000 mg | ORAL_TABLET | Freq: Two times a day (BID) | ORAL | 0 refills | Status: DC
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
## 2021-01-21 NOTE — ED Notes (Signed)
ED Provider at bedside. 

## 2021-01-25 ENCOUNTER — Ambulatory Visit: Payer: BC Managed Care – PPO | Admitting: Nurse Practitioner

## 2021-01-25 ENCOUNTER — Encounter: Payer: Self-pay | Admitting: Nurse Practitioner

## 2021-01-25 ENCOUNTER — Other Ambulatory Visit: Payer: Self-pay

## 2021-01-25 VITALS — BP 134/84 | Ht 64.0 in | Wt 209.0 lb

## 2021-01-25 DIAGNOSIS — Z139 Encounter for screening, unspecified: Secondary | ICD-10-CM | POA: Diagnosis not present

## 2021-01-25 DIAGNOSIS — F321 Major depressive disorder, single episode, moderate: Secondary | ICD-10-CM | POA: Diagnosis not present

## 2021-01-25 DIAGNOSIS — M542 Cervicalgia: Secondary | ICD-10-CM | POA: Diagnosis not present

## 2021-01-25 DIAGNOSIS — G44319 Acute post-traumatic headache, not intractable: Secondary | ICD-10-CM

## 2021-01-25 DIAGNOSIS — Z7689 Persons encountering health services in other specified circumstances: Secondary | ICD-10-CM
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
## 2021-01-25 NOTE — Assessment & Plan Note (Signed)
Pt told to restart taking her lexapro. She stated the her depression is controlled on lexapro 5 mg and does not want med increased. Pt reminded of the need to not stop taking medication abruptly, she verbalized understanding. S/s of ssri withdrawal including HA, nausea anxiety discussed with pt.
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
## 2021-01-25 NOTE — Assessment & Plan Note (Signed)
DG cervical spine ordered. Take Robaxin and tylenol as needed.

## 2021-01-25 NOTE — Assessment & Plan Note (Addendum)
Refer to sports medicine. Pt told to go to the ER immediately  if HA is worsened or uncontrolled with tylenol.gets confused,worsened vision,  has severev N/V. She verbalized understanding.
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
## 2021-01-26 ENCOUNTER — Ambulatory Visit (HOSPITAL_COMMUNITY)
Admission: RE | Admit: 2021-01-26 | Discharge: 2021-01-26 | Disposition: A | Discharge status: Home / Self Care | Payer: BC Managed Care – PPO | Source: Ambulatory Visit | Attending: Nurse Practitioner | Admitting: Nurse Practitioner

## 2021-01-26 DIAGNOSIS — M542 Cervicalgia: Secondary | ICD-10-CM | POA: Diagnosis present

## 2021-01-26 IMAGING — DX DG CERVICAL SPINE COMPLETE 4+V
6 series · 6 of 6 positions shown · non-contrast
Comparison: None.

CLINICAL DATA: Neck pain, left-sided, for over a month, no known
injury

EXAM:
CERVICAL SPINE - COMPLETE 4+ VIEW

[c-spine lat]
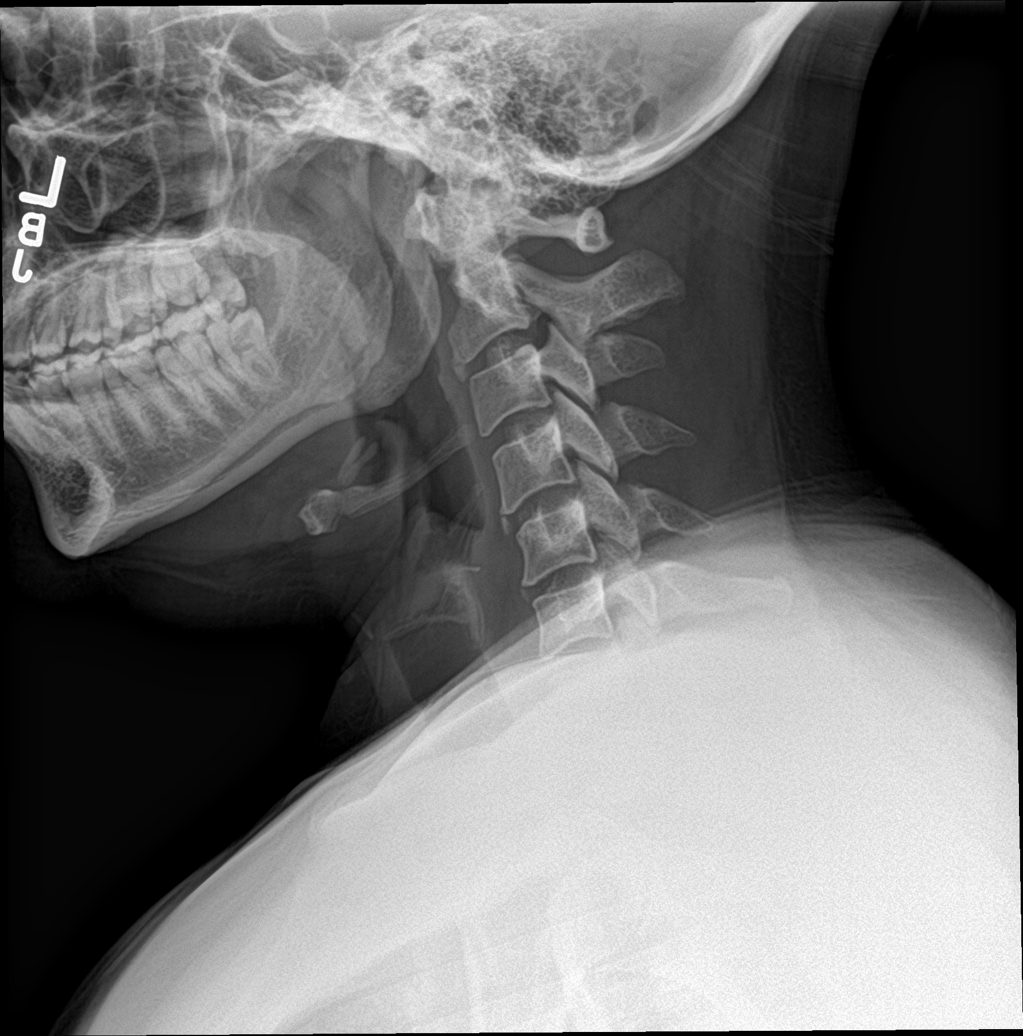

[c-spine obl (1 of 2)]
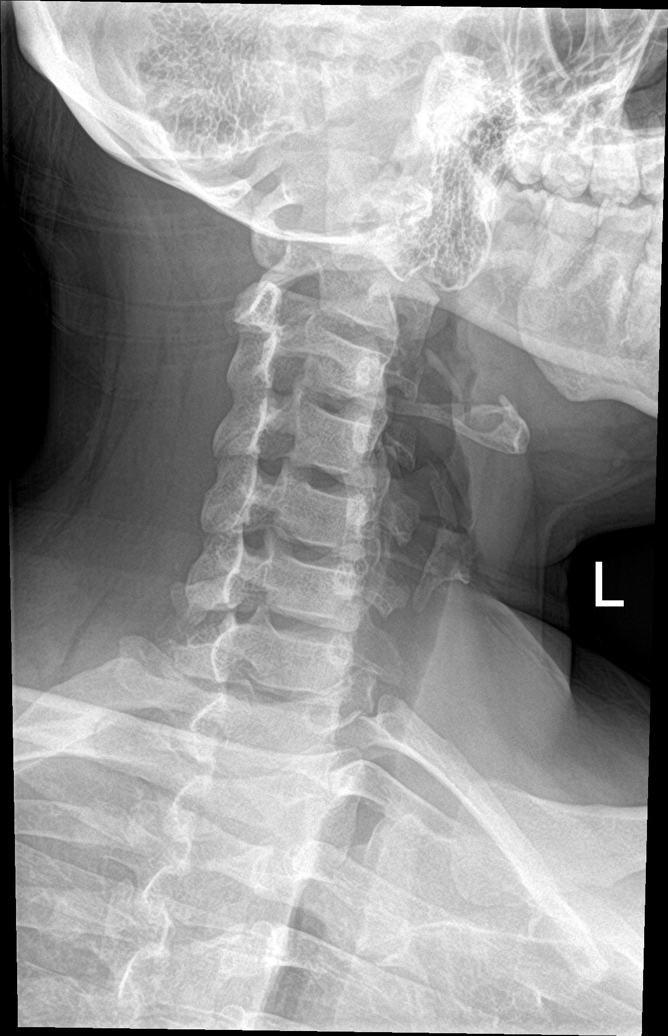

[c-spine obl (2 of 2)]
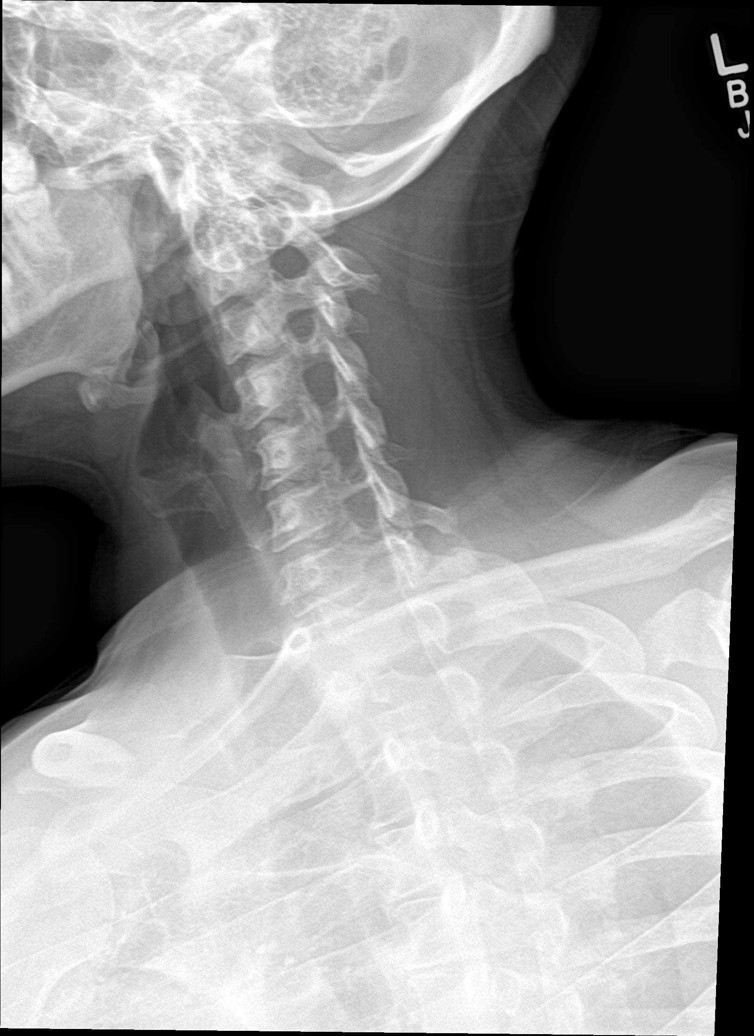

[c-spine ap]
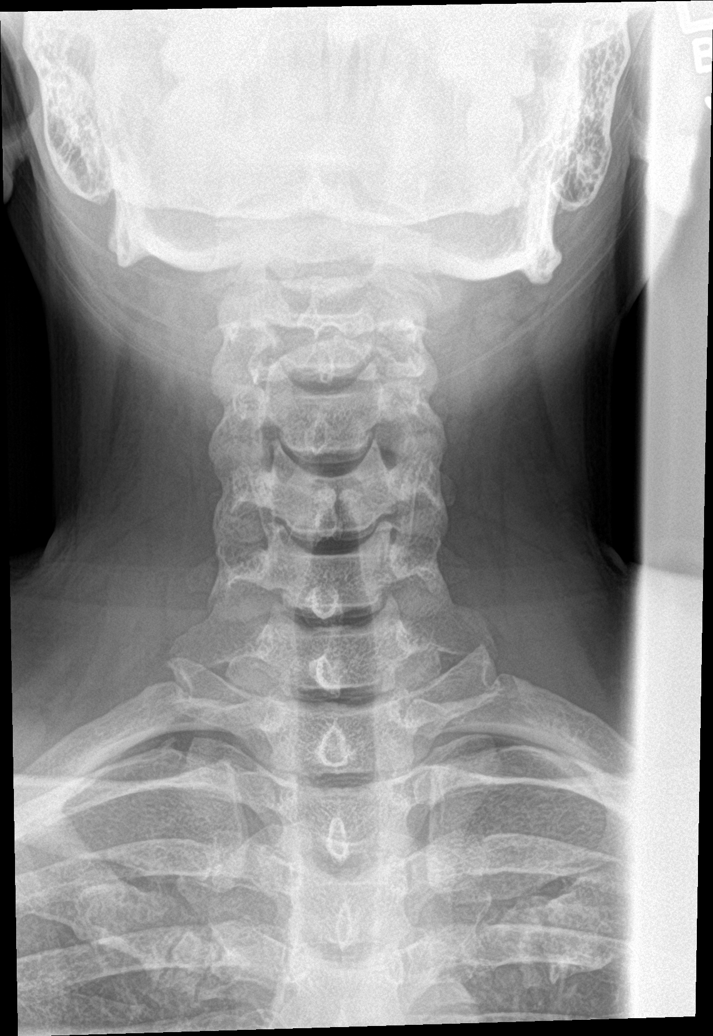

[c-spine open mouth]
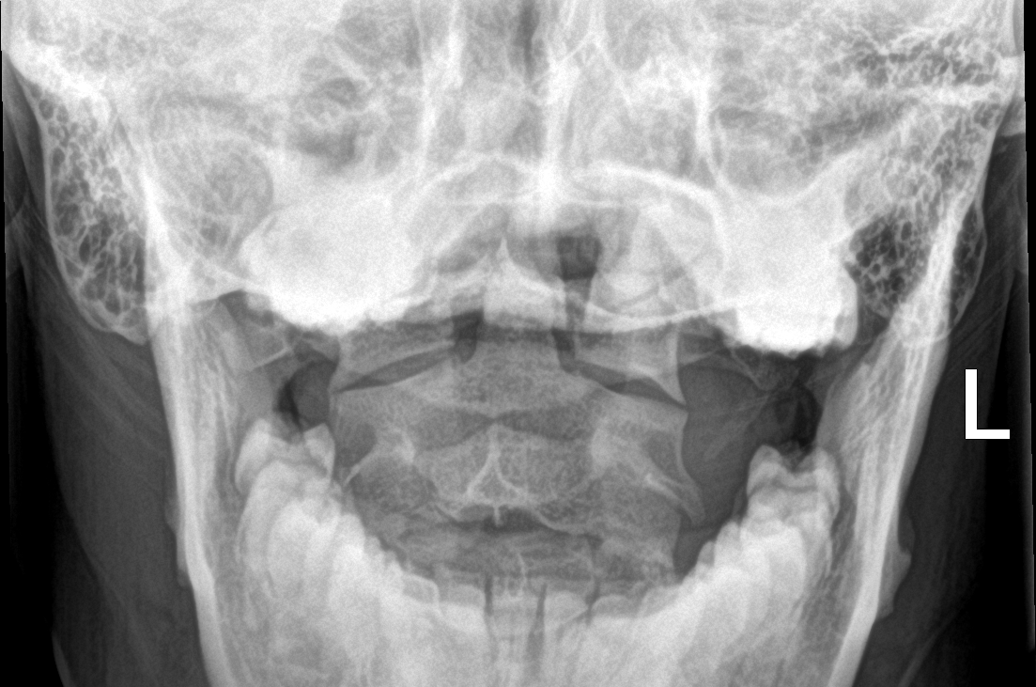

[c-spine swimmers trauma]
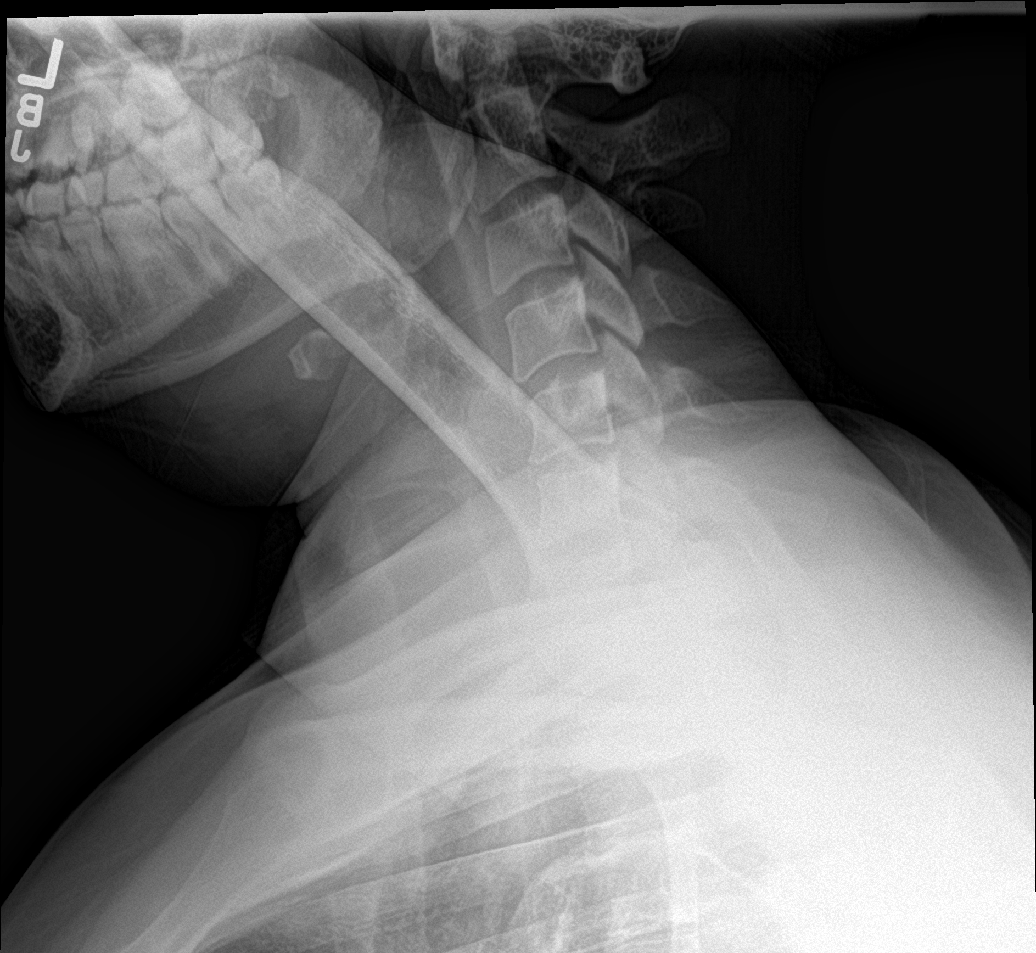

[6 of 6 positions shown; findings below may reference images not displayed]

FINDINGS: No fracture or static subluxation of the cervical spine. Disc spaces
and vertebral body heights are preserved. Partially imaged skull
base, cervical soft tissues, and upper chest are unremarkable.
IMPRESSION: 1. No fracture or static subluxation of the cervical spine. Disc
spaces and vertebral body heights are preserved.

2. Cervical disc and neural foraminal pathology may be further
evaluated by MRI if indicated by neurologically localizing signs and
symptoms.
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
## 2021-01-27 ENCOUNTER — Other Ambulatory Visit: Payer: Self-pay | Admitting: Nurse Practitioner

## 2021-01-27 ENCOUNTER — Other Ambulatory Visit: Payer: Self-pay

## 2021-01-27 ENCOUNTER — Ambulatory Visit: Payer: BC Managed Care – PPO | Admitting: Sports Medicine

## 2021-01-27 DIAGNOSIS — M62838 Other muscle spasm: Secondary | ICD-10-CM

## 2021-01-27 DIAGNOSIS — M542 Cervicalgia: Secondary | ICD-10-CM

## 2021-01-27 MED ORDER — CYCLOBENZAPRINE HCL 5 MG PO TABS: 5.0000 mg | ORAL_TABLET | Freq: Every day | ORAL | 0 refills | Status: DC

## 2021-01-27 MED ORDER — MELOXICAM 15 MG PO TABS: 15.0000 mg | ORAL_TABLET | Freq: Every day | ORAL | 0 refills | Status: DC

## 2021-01-27 MED ORDER — CYCLOBENZAPRINE HCL 5 MG PO TABS: 5.0000 mg | ORAL_TABLET | Freq: Three times a day (TID) | ORAL | 0 refills | Status: DC | PRN

## 2021-01-27 NOTE — Patient Instructions (Signed)
Good to see you  Meloxicam 15 mg 2 weeks nightly  Flexeril 5 mg nightly for muscle spasm Stop Robaxin and Ibuprofen  Tylenol for breakthrough pain  Work note provided  2 week follow up

## 2021-02-08 ENCOUNTER — Telehealth: Payer: Self-pay | Admitting: Sports Medicine

## 2021-02-08 NOTE — Telephone Encounter (Signed)
[employer] ppwk faxed.
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
## 2021-02-10 ENCOUNTER — Other Ambulatory Visit: Payer: Self-pay

## 2021-02-10 ENCOUNTER — Ambulatory Visit: Payer: BC Managed Care – PPO | Admitting: Sports Medicine

## 2021-02-10 DIAGNOSIS — M62838 Other muscle spasm: Secondary | ICD-10-CM

## 2021-02-10 DIAGNOSIS — M9908 Segmental and somatic dysfunction of rib cage: Secondary | ICD-10-CM

## 2021-02-10 DIAGNOSIS — M542 Cervicalgia: Secondary | ICD-10-CM | POA: Diagnosis not present

## 2021-02-10 DIAGNOSIS — M9903 Segmental and somatic dysfunction of lumbar region: Secondary | ICD-10-CM

## 2021-02-10 DIAGNOSIS — M9901 Segmental and somatic dysfunction of cervical region: Secondary | ICD-10-CM

## 2021-02-10 DIAGNOSIS — M9905 Segmental and somatic dysfunction of pelvic region: Secondary | ICD-10-CM

## 2021-02-10 DIAGNOSIS — M9902 Segmental and somatic dysfunction of thoracic region: Secondary | ICD-10-CM

## 2021-02-10 MED ORDER — CYCLOBENZAPRINE HCL 5 MG PO TABS: 5.0000 mg | ORAL_TABLET | Freq: Every day | ORAL | 0 refills | Status: DC

## 2021-02-10 MED ORDER — CYCLOBENZAPRINE HCL 5 MG PO TABS: 5.0000 mg | ORAL_TABLET | Freq: Three times a day (TID) | ORAL | 0 refills | Status: DC | PRN

## 2021-02-10 NOTE — Patient Instructions (Addendum)
Good to see you  Flexeril 5mg  refill Stop using meloxicam daily , can use remainder as needed use tylenol for breakthrough pain  Neck HEP  Start physical therapy  2 week work note provided 2 week follow up

## 2021-02-14 ENCOUNTER — Telehealth: Payer: Self-pay | Admitting: Sports Medicine

## 2021-02-14 NOTE — Telephone Encounter (Signed)
Patient called stating that she has been having extreme pain in the middle of her back and on the right side since her last visit with Dr Glennon Mac. Because of the pain, she has been having to take the medication she was previously even though she was told to taper off of it.  Any advice on what she can do?  She also said that she was referred to [facility] and they told her that they were only able to see people who have lawyers.  Please advise.

## 2021-02-14 NOTE — Telephone Encounter (Signed)
Pt was messaged about taking meloxicam, tylenol, and that her muscle relaxer was filled last visit. I also let her know that we could send in a different referral for pt if she would like.

## 2021-02-15 ENCOUNTER — Other Ambulatory Visit: Payer: Self-pay | Admitting: Sports Medicine

## 2021-02-15 DIAGNOSIS — M9908 Segmental and somatic dysfunction of rib cage: Secondary | ICD-10-CM

## 2021-02-15 DIAGNOSIS — M9902 Segmental and somatic dysfunction of thoracic region: Secondary | ICD-10-CM

## 2021-02-15 DIAGNOSIS — M9901 Segmental and somatic dysfunction of cervical region: Secondary | ICD-10-CM

## 2021-02-15 DIAGNOSIS — M9905 Segmental and somatic dysfunction of pelvic region: Secondary | ICD-10-CM

## 2021-02-15 NOTE — Telephone Encounter (Signed)
New referral for physical therapy has been sent

## 2021-02-23 ENCOUNTER — Other Ambulatory Visit: Payer: Self-pay | Admitting: Sports Medicine
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
## 2021-02-24 ENCOUNTER — Ambulatory Visit: Payer: BC Managed Care – PPO | Admitting: Sports Medicine

## 2021-02-24 ENCOUNTER — Other Ambulatory Visit: Payer: Self-pay

## 2021-02-24 DIAGNOSIS — M545 Low back pain, unspecified: Secondary | ICD-10-CM | POA: Diagnosis not present

## 2021-02-24 DIAGNOSIS — G8929 Other chronic pain: Secondary | ICD-10-CM

## 2021-02-24 DIAGNOSIS — M546 Pain in thoracic spine: Secondary | ICD-10-CM

## 2021-02-24 DIAGNOSIS — M542 Cervicalgia: Secondary | ICD-10-CM

## 2021-02-24 NOTE — Patient Instructions (Addendum)
Good to see you  Discontinue use of Meloxicam  and flexeril  Start tylenol 500 mg 2 tablets 2-3 times a day  Work note provided  4 week follow up

## 2021-02-25 ENCOUNTER — Telehealth: Payer: Self-pay | Admitting: Sports Medicine

## 2021-02-25 NOTE — Telephone Encounter (Signed)
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 She was told that they do not have light duty for her to do so she would need to be out of work completely and could be out until March. She asked if someone could call her to discuss this so that a new letter could be written to reflect the change.  Please advise.

## 2021-03-01 ENCOUNTER — Encounter: Payer: BC Managed Care – PPO | Admitting: Nurse Practitioner

## 2021-03-02 ENCOUNTER — Telehealth: Payer: Self-pay

## 2021-03-02 NOTE — Telephone Encounter (Signed)
Patient called upset she was charged $29.00 for a form filled out for her on 01/04/21.  She said she was not aware of the charge.  We have signs posted at check in and check out.  I have sent a email to charge correction to clear this $29.00 fee and told the patient we charge for ALL FORMS.  She is aware at this point.

## 2021-03-03 LAB — HM PAP SMEAR: HM Pap smear: NEGATIVE

## 2021-03-04 NOTE — Telephone Encounter (Signed)
Pt called inquiring about this msg. Found new letter done 02/25/2021, emailed to pt and faxed to pt employer.

## 2021-03-08 ENCOUNTER — Other Ambulatory Visit: Payer: Self-pay | Admitting: Obstetrics and Gynecology

## 2021-03-08 ENCOUNTER — Encounter: Payer: Self-pay | Admitting: Sports Medicine

## 2021-03-08 DIAGNOSIS — N644 Mastodynia: Secondary | ICD-10-CM

## 2021-03-09 ENCOUNTER — Other Ambulatory Visit: Payer: Self-pay | Admitting: Sports Medicine

## 2021-03-09 DIAGNOSIS — M9905 Segmental and somatic dysfunction of pelvic region: Secondary | ICD-10-CM

## 2021-03-09 NOTE — Telephone Encounter (Signed)
Referral for gynecology has been placed

## 2021-03-14 ENCOUNTER — Encounter: Payer: Self-pay | Admitting: *Deleted
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
## 2021-03-18 ENCOUNTER — Other Ambulatory Visit: Payer: Self-pay | Admitting: Sports Medicine

## 2021-03-18 DIAGNOSIS — M9903 Segmental and somatic dysfunction of lumbar region: Secondary | ICD-10-CM

## 2021-03-18 NOTE — Telephone Encounter (Signed)
Patient called following up on the gynecology referral. It appears that the referral we sent over to [facility] was denied.  There was no reason noted as to why the referral was denied. I tried to call the office multiple times but all calls were being answered by the nurse line and no contact directly to the office. The nurse line said they would send a message to the office and have someone call me but at this time, I have not heard from them. Sula was originally scheduled to have the ultrasound done at Dr Malachi Carl office on 03/17/2021 but they had to reschedule it because the ultrasound tech was out sick. She is now scheduled for Feb 14th.   She is very frustrated and in a lot of pain but expressed her appreication for Dr Glennon Mac and his care. She said that he is the best doctor she has had in a long time.

## 2021-03-18 NOTE — Telephone Encounter (Signed)
New referral for gynecology has been sent

## 2021-03-24 ENCOUNTER — Other Ambulatory Visit: Payer: Self-pay

## 2021-03-24 ENCOUNTER — Ambulatory Visit: Payer: BC Managed Care – PPO | Admitting: Sports Medicine

## 2021-03-24 VITALS — BP 120/70 | HR 118 | Ht 64.0 in | Wt 219.0 lb

## 2021-03-24 DIAGNOSIS — M546 Pain in thoracic spine: Secondary | ICD-10-CM

## 2021-03-24 DIAGNOSIS — M545 Low back pain, unspecified: Secondary | ICD-10-CM

## 2021-03-24 DIAGNOSIS — G8929 Other chronic pain: Secondary | ICD-10-CM | POA: Diagnosis not present

## 2021-03-24 NOTE — Patient Instructions (Addendum)
Good to see you  MRI referral 2 week work note provided Please call and schedule a follow up 3 days after your MRI to discuss results

## 2021-04-01 ENCOUNTER — Telehealth: Payer: Self-pay | Admitting: Sports Medicine

## 2021-04-01 ENCOUNTER — Other Ambulatory Visit: Payer: Self-pay | Admitting: Obstetrics and Gynecology

## 2021-04-01 DIAGNOSIS — N644 Mastodynia: Secondary | ICD-10-CM

## 2021-04-01 NOTE — Telephone Encounter (Signed)
Spoke to patient and informed that Dr Glennon Mac had already left for the day when I spoke to her. I told her that we would see what we could do on Monday. She is going to reschedule the appointment that was scheduled for Sunday so that she does not have to make multiple trips.  Patient did also mention that when she had the first mammogram, the person said that she has seen where other women have had the same pain and it was related to their back? So she would like more imaging done rather than just the lumbar spine incase something is going on in her upper back.

## 2021-04-01 NOTE — Telephone Encounter (Signed)
Patient called to let Dr Glennon Mac know that she found out that she has very dense breast tissue and will be having a more detailed test done to determine the cause of her pain.  She is scheduled for the MRI on Sunday but she is wanting to have the MRI done from her head to her lower back because she is still having issues with those areas.   Please advise.

## 2021-04-03 ENCOUNTER — Other Ambulatory Visit: Payer: BC Managed Care – PPO

## 2021-04-04 NOTE — Telephone Encounter (Signed)
Pt was called and spoken to at 3:00 pm , she was asked to follow up in clinic to discuss why we couldn't at this time add on a neck and upper back MRI at this time

## 2021-04-04 NOTE — Telephone Encounter (Signed)
I attempted to call and speak with patient.  My call went to voicemail.  I left a voicemail informing patient that based on our prior visits and my prior physical exams, I do not have enough information to justify ordering a cervical spine MRI or a thoracic spine MRI and that insurance would not approve those image studies.  If patient would like to discuss these further, I recommend coming into clinic so we can perform a physical exam and potentially x-rays so that we could order MRIs if they are appropriate.  We have already ordered a lumbar spine MRI which has been approved and patient can get at any time.
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
## 2021-04-05 ENCOUNTER — Other Ambulatory Visit: Payer: Self-pay

## 2021-04-05 ENCOUNTER — Ambulatory Visit: Payer: BC Managed Care – PPO | Admitting: Sports Medicine

## 2021-04-05 ENCOUNTER — Ambulatory Visit (INDEPENDENT_AMBULATORY_CARE_PROVIDER_SITE_OTHER): Payer: BC Managed Care – PPO

## 2021-04-05 VITALS — BP 120/80 | HR 114 | Ht 64.0 in | Wt 214.0 lb

## 2021-04-05 DIAGNOSIS — M542 Cervicalgia: Secondary | ICD-10-CM

## 2021-04-05 DIAGNOSIS — M545 Low back pain, unspecified: Secondary | ICD-10-CM

## 2021-04-05 DIAGNOSIS — M546 Pain in thoracic spine: Secondary | ICD-10-CM | POA: Diagnosis not present

## 2021-04-05 DIAGNOSIS — G8929 Other chronic pain: Secondary | ICD-10-CM

## 2021-04-05 IMAGING — DX DG THORACIC SPINE 2V
3 series · 4 of 4 positions shown · non-contrast
Comparison: None.

CLINICAL DATA: Chronic thoracic pain

EXAM:
THORACIC SPINE 2 VIEWS

[t-spine ap]
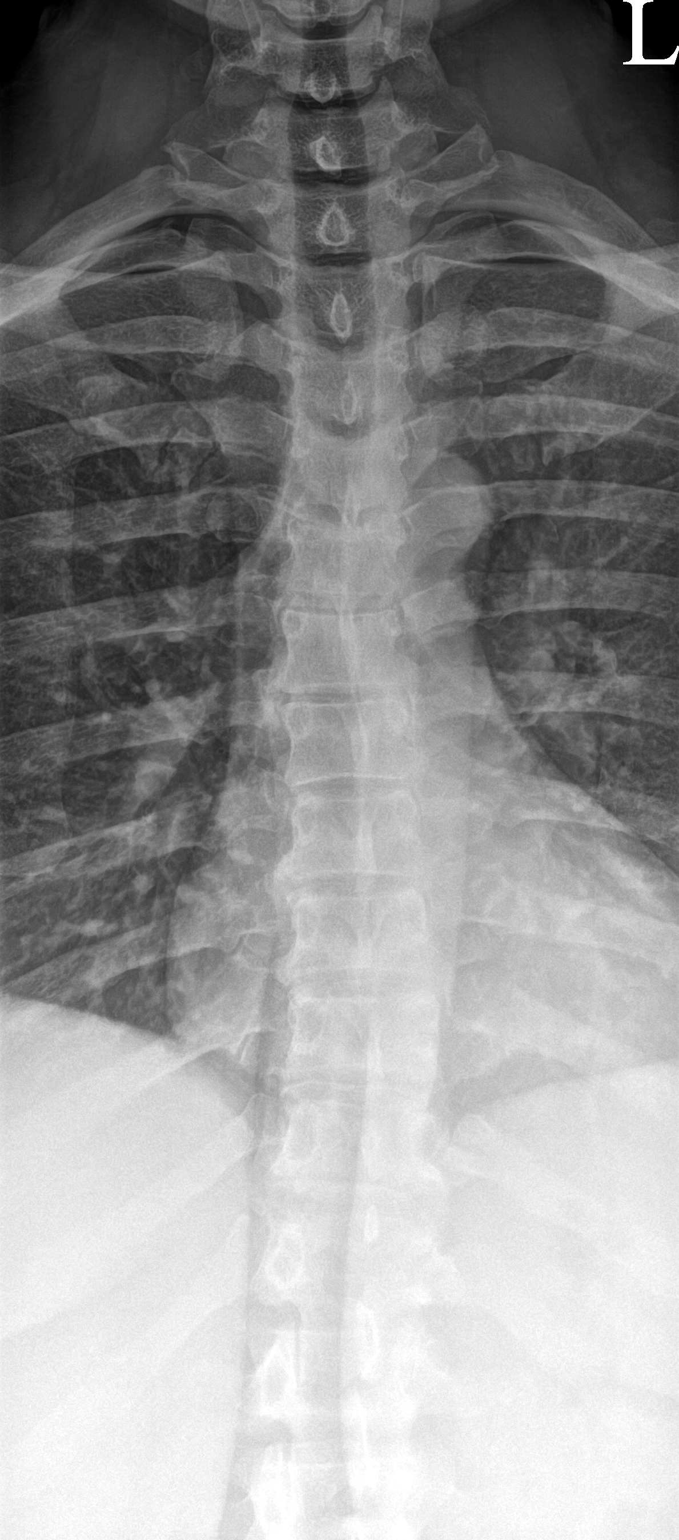

[Series 2: t-spine lat · 0.14mm/px · 2 of 2 slices shown]
[im 1/2]
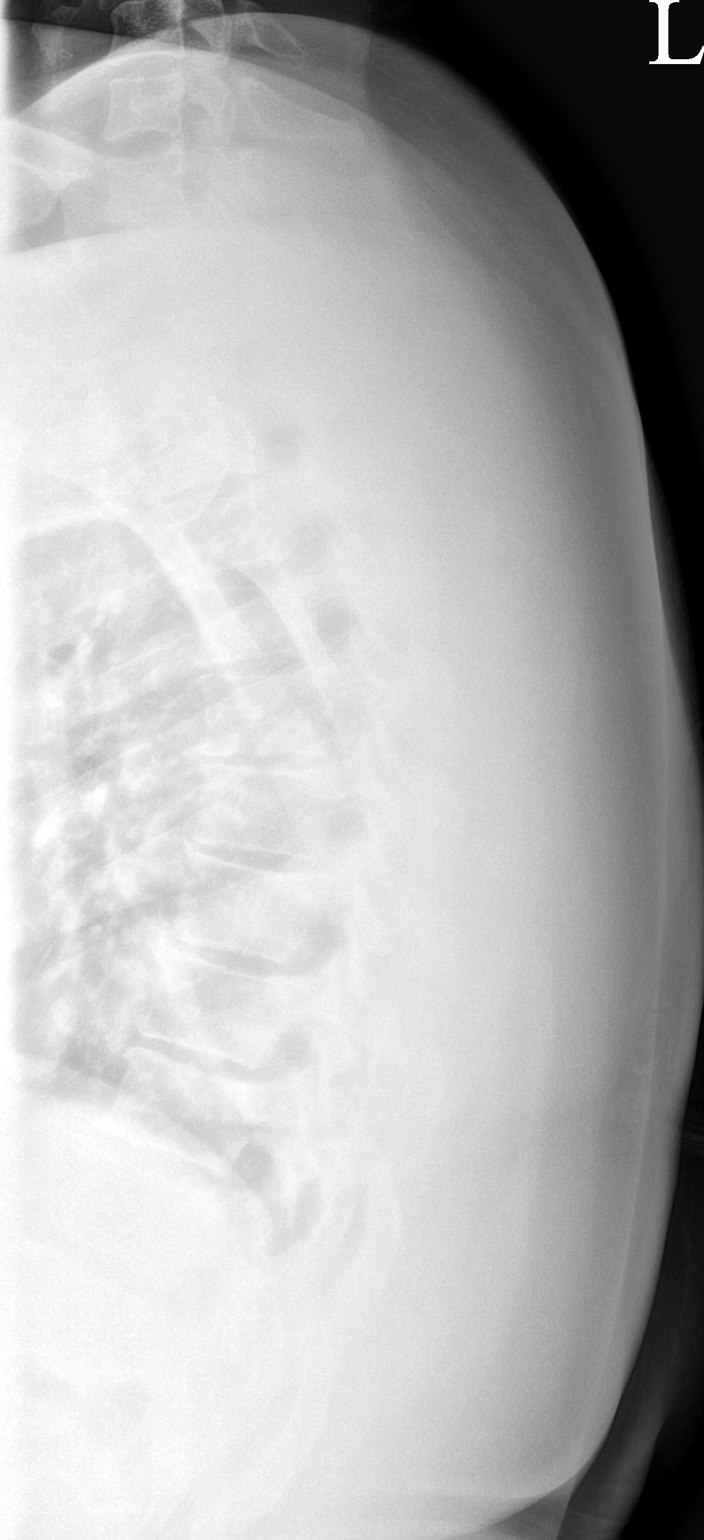
[im 2/2]
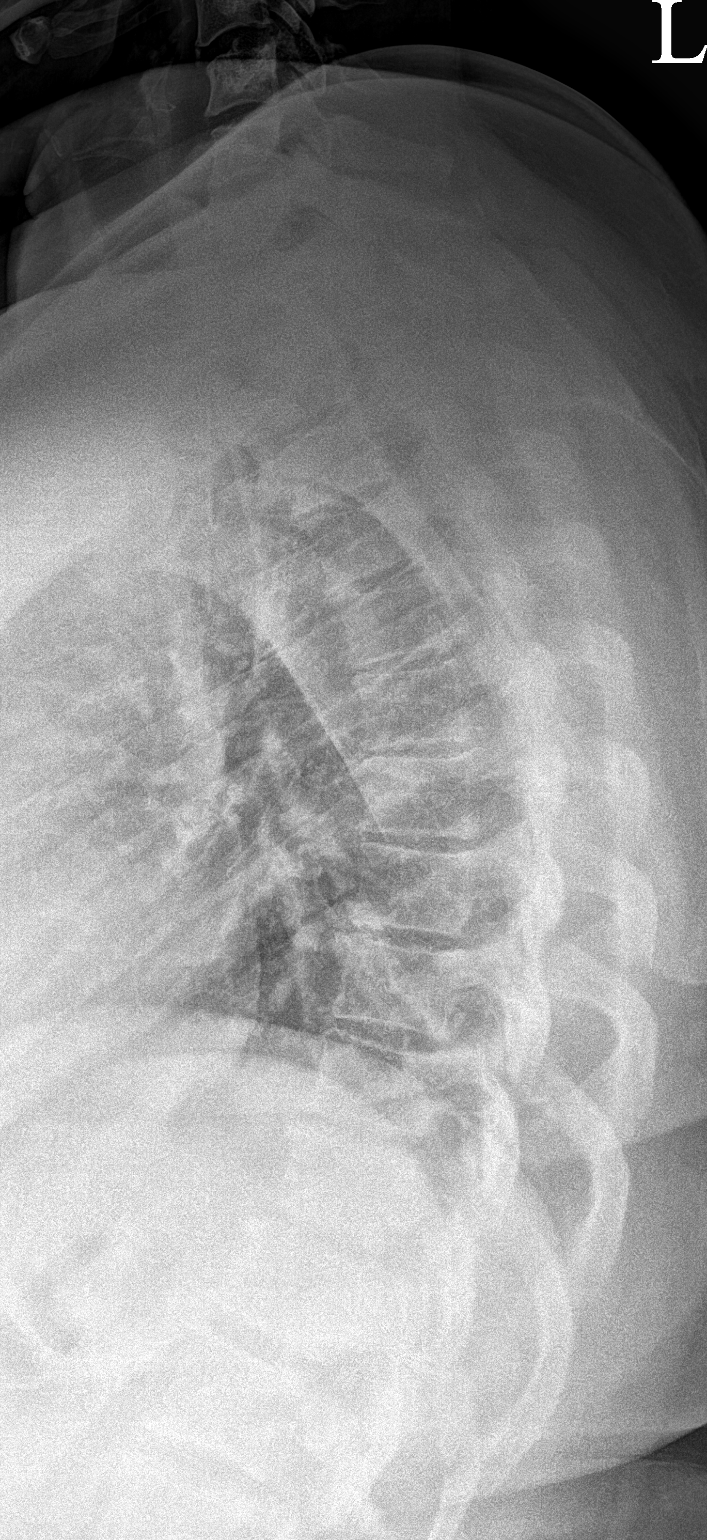

[ct-spine swimmers]
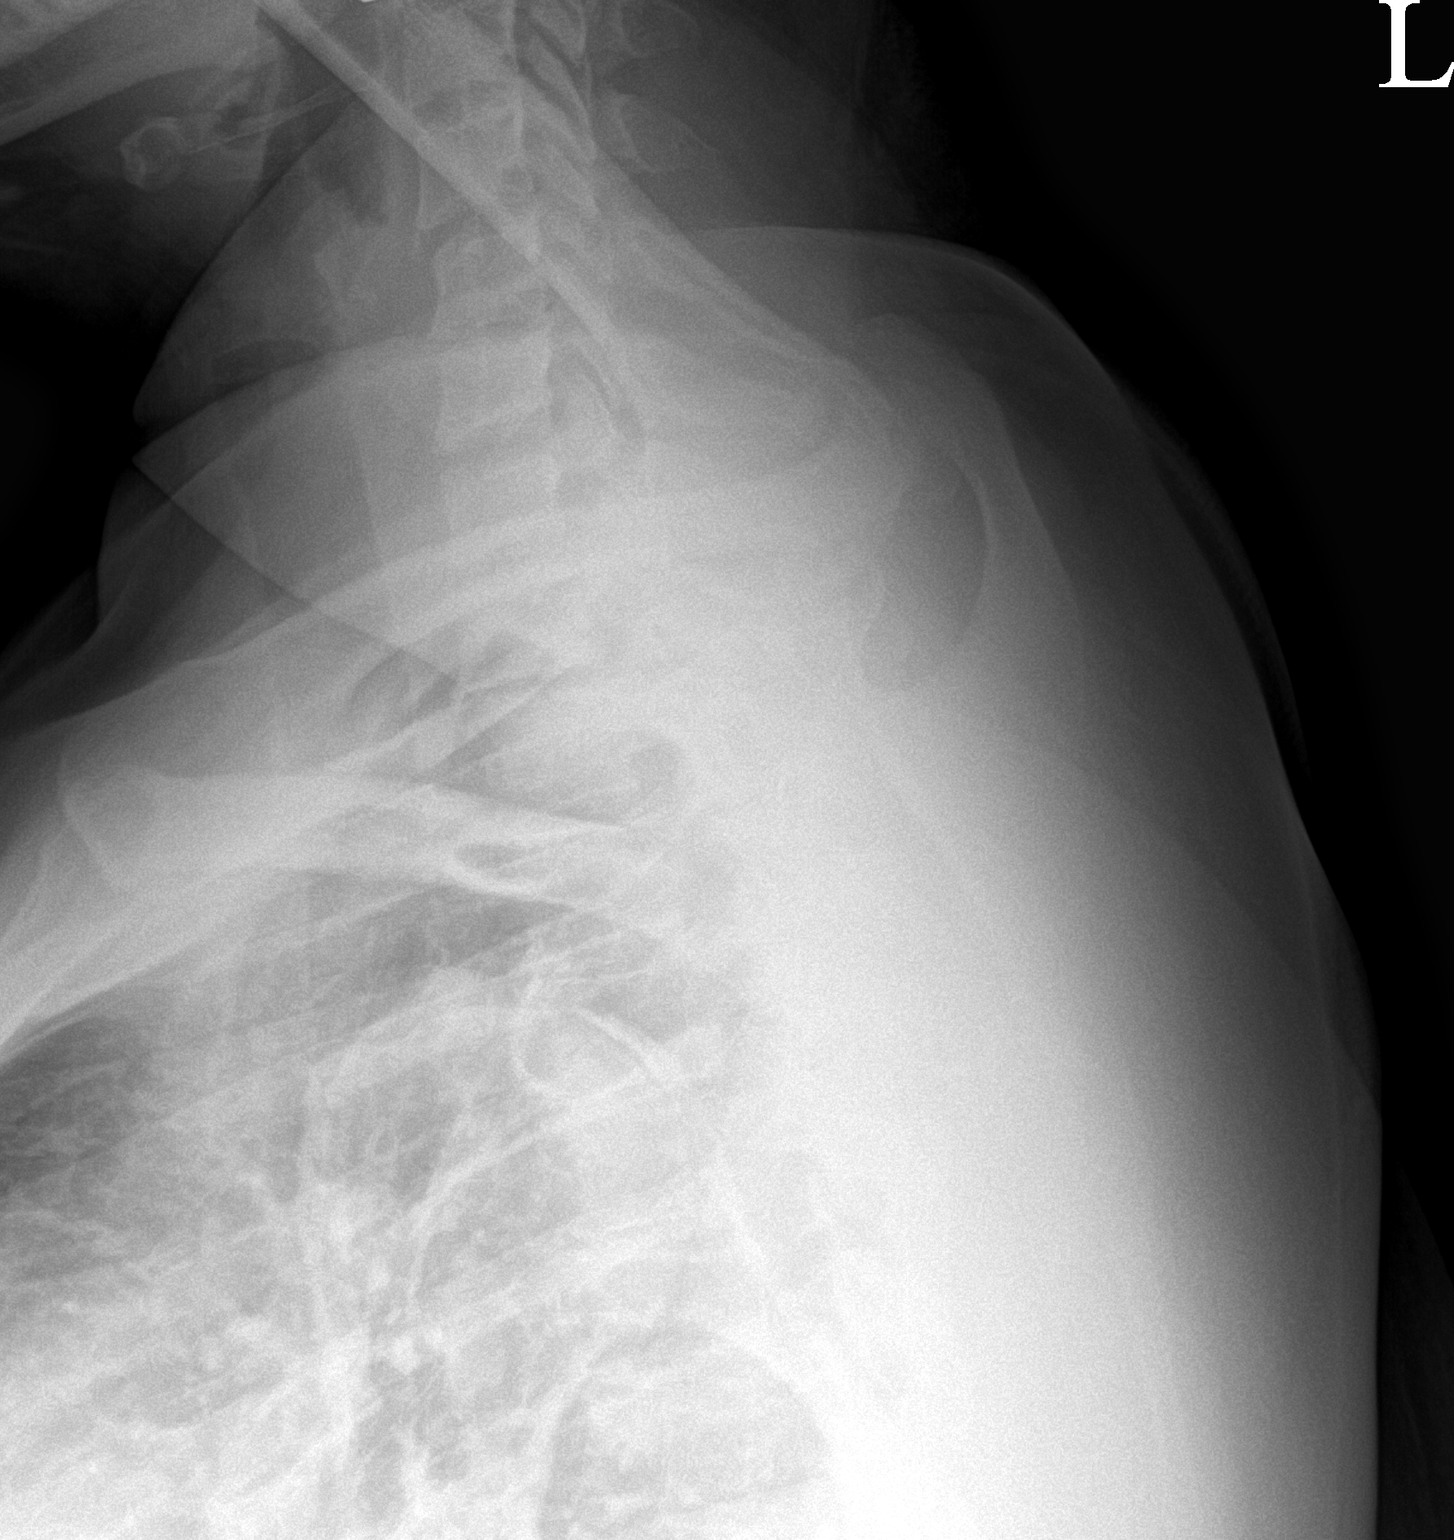

[4 of 4 positions shown; findings below may reference images not displayed]

FINDINGS: There is no evidence of thoracic spine fracture. Alignment is
normal. No other significant bone abnormalities are identified.
IMPRESSION: Negative.

## 2021-04-05 NOTE — Patient Instructions (Addendum)
Good to see you  Thoracic xray on the way out  Cervical spine MRI Thoracic MRI Work note provided  Recommend that you reach out OBGYN and let them know you haven't heard from pelvic referral Reach out to insurance company and find out what your payment will be for lumbar,thoracic, cervical MRI If you can afford all 3 the imaging center should be reaching out to you to schedule  If you can not afford contact our clinic Follow up with me 3 days after MRIs are preformed you need to call and make that appointment for 3 days after

## 2021-04-05 NOTE — Telephone Encounter (Signed)
Patient is scheduled to see Dr Glennon Mac today.

## 2021-04-06 ENCOUNTER — Telehealth: Payer: Self-pay | Admitting: Sports Medicine

## 2021-04-06 NOTE — Telephone Encounter (Signed)
Pt called, saw Dr. Philis Pique today. ? ?States that Dr. Philis Pique denies every referring pt to [facility] Pelvic specialist, so this referral was never done. ? ?Per pt, Dr.Horvath has dismissed her from her care as of today. ? ?She also spoke of a misunderstanding about a pathology report but I could not comprehend. ? ?Mentioned she should be seeing her PCP and keeping PCP in the loop of her issues. ? ?Will Dr. Glennon Mac refer her to [facility] Pelvic specialist? ?

## 2021-04-07 NOTE — Telephone Encounter (Signed)
Pt was called and discussed that we are not equipped to help with her pelvic floor issues, we discussed her doing more research to find a OB/GYN that better suits her needs and that when she found one we would gladly send that referral in for that new doctor. Pt was appreciative and will call back with OBGYN name   ?

## 2021-04-12 ENCOUNTER — Ambulatory Visit
Admission: RE | Admit: 2021-04-12 | Discharge: 2021-04-12 | Disposition: A | Discharge status: Home / Self Care | Payer: BC Managed Care – PPO | Source: Ambulatory Visit | Attending: Obstetrics and Gynecology | Admitting: Obstetrics and Gynecology

## 2021-04-12 ENCOUNTER — Ambulatory Visit (INDEPENDENT_AMBULATORY_CARE_PROVIDER_SITE_OTHER): Payer: BC Managed Care – PPO

## 2021-04-12 ENCOUNTER — Ambulatory Visit: Payer: BC Managed Care – PPO

## 2021-04-12 ENCOUNTER — Other Ambulatory Visit: Payer: Self-pay

## 2021-04-12 DIAGNOSIS — N644 Mastodynia: Secondary | ICD-10-CM

## 2021-04-12 DIAGNOSIS — M546 Pain in thoracic spine: Secondary | ICD-10-CM

## 2021-04-12 DIAGNOSIS — M545 Low back pain, unspecified: Secondary | ICD-10-CM | POA: Diagnosis not present

## 2021-04-12 DIAGNOSIS — M542 Cervicalgia: Secondary | ICD-10-CM

## 2021-04-12 DIAGNOSIS — G8929 Other chronic pain: Secondary | ICD-10-CM
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
## 2021-04-12 IMAGING — US US BREAST*R* LIMITED INC AXILLA
1 series · 2 of 2 positions shown · non-contrast
Comparison: Previous exam(s).

CLINICAL DATA: Patient describes diffuse bilateral breast pain.
Patient describes a focal painful lump within the outer RIGHT
breast.

EXAM:
DIGITAL DIAGNOSTIC BILATERAL MAMMOGRAM WITH TOMOSYNTHESIS AND CAD;
ULTRASOUND RIGHT BREAST LIMITED
TECHNIQUE: Bilateral digital diagnostic mammography and breast tomosynthesis
was performed. The images were evaluated with computer-aided
detection.; Targeted ultrasound examination of the right breast was
performed

[Series 1: us breast*right* limited inc axilla · 0.10mm/px · 2 of 2 slices shown]
[im 1/2]
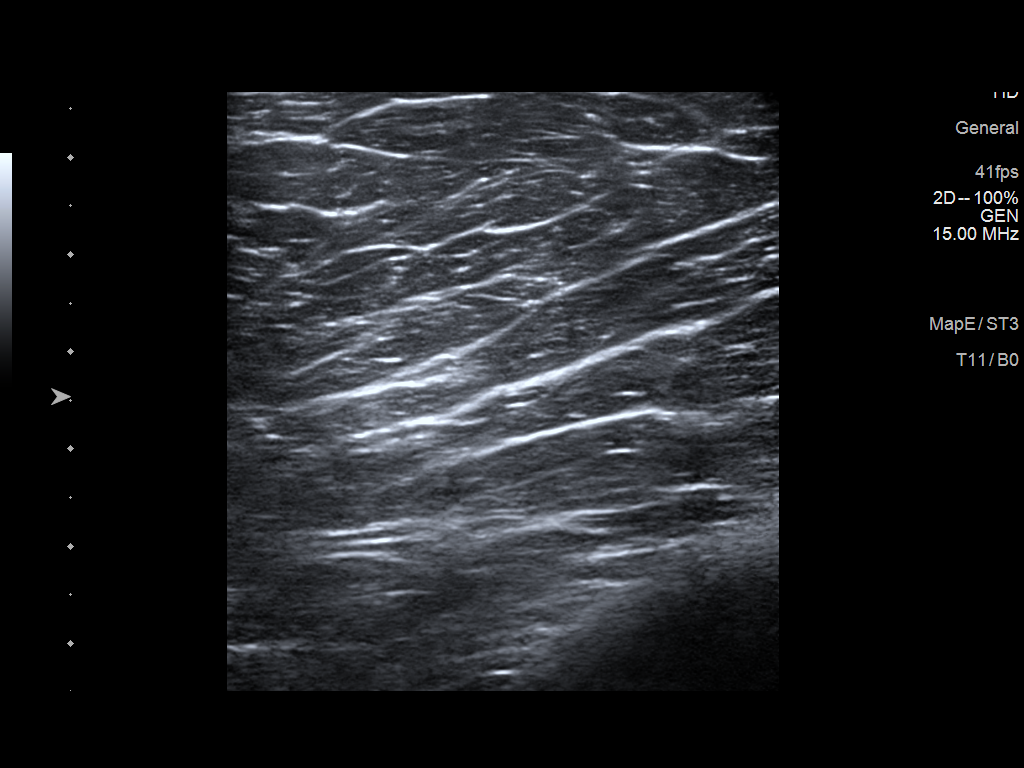
[im 2/2]
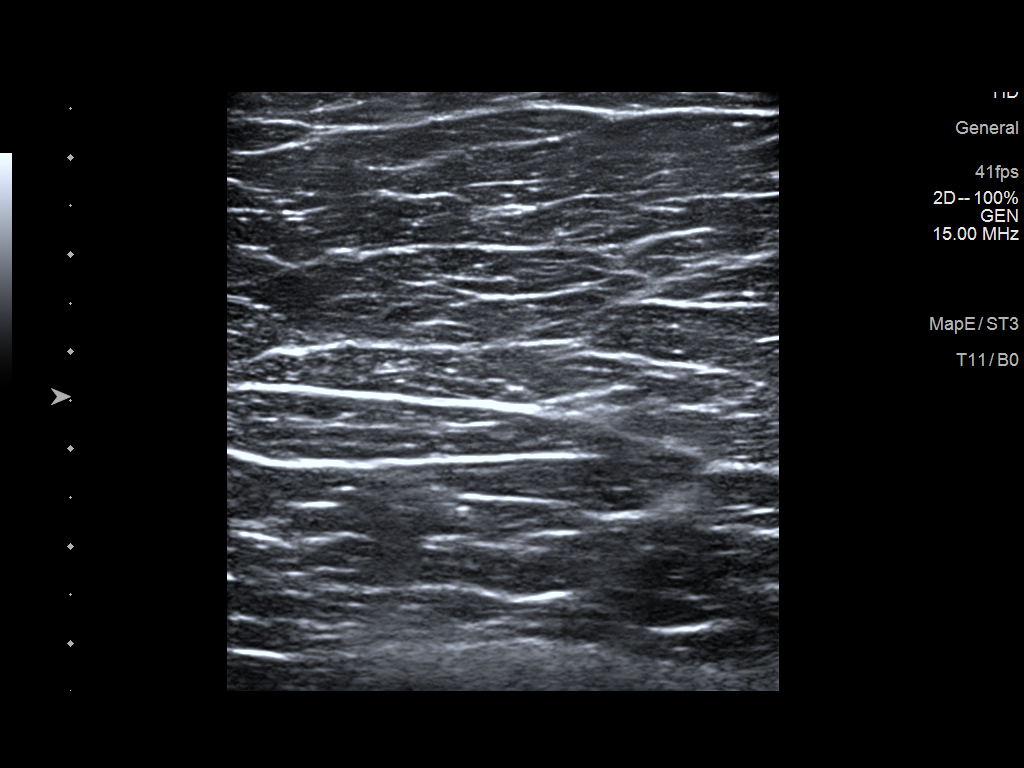

[2 of 2 positions shown; findings below may reference images not displayed]

ACR Breast Density Category b: There are scattered areas of
fibroglandular density.
FINDINGS: There are no new dominant masses, suspicious calcifications or
secondary signs of malignancy within either breast. Specifically,
there is no mammographic abnormality within the outer RIGHT breast
corresponding to the area of clinical concern.

On physical exam, I feel no fixed or circumscribed mass within the
lower outer quadrant of the RIGHT breast.

Targeted ultrasound is performed, evaluating the outer RIGHT breast
with particular attention to the lower outer quadrant as directed by
the patient, showing only normal fibroglandular tissues and fat
lobules throughout. No solid or cystic mass. No sebaceous cyst
within the skin.
IMPRESSION: 1. No evidence of malignancy within either breast.
2. Specifically, no evidence of malignancy or acute findings within
the outer RIGHT breast, corresponding to the area of patient's
clinical concern.

RECOMMENDATION:
1.  Screening mammogram in one year.(Code:[AJ])
2. Breast pain is a common condition which will often resolve on its
own without intervention. Benign causes of breast pain were
discussed with the patient. Breast pain can be improved by
over-the-counter pain medications such as Aspercreme, oral NSAIDs,
and topical NSAIDs. Studies have shown an improvement with use of
evening primrose oil and Vitamin E. The patient was encouraged to
follow-up with referring physician if the pain persisted or worsened
as this might indicate a need to evaluate the deeper structures of
the underlying chest wall. The patient was instructed to return
sooner if the area that she feels becomes larger and/or firmer to
palpation, or if a new palpable abnormality is identified in either
breast.

I have discussed the findings and recommendations with the patient.
If applicable, a reminder letter will be sent to the patient
regarding the next appointment.

BI-RADS CATEGORY  1: Negative.

## 2021-04-12 IMAGING — MG DIGITAL DIAGNOSTIC BILAT W/ TOMO W/ CAD
8 series · 8 of 24 positions shown · non-contrast
Comparison: Previous exam(s).

CLINICAL DATA: Patient describes diffuse bilateral breast pain.
Patient describes a focal painful lump within the outer RIGHT
breast.

EXAM:
DIGITAL DIAGNOSTIC BILATERAL MAMMOGRAM WITH TOMOSYNTHESIS AND CAD;
ULTRASOUND RIGHT BREAST LIMITED
TECHNIQUE: Bilateral digital diagnostic mammography and breast tomosynthesis
was performed. The images were evaluated with computer-aided
detection.; Targeted ultrasound examination of the right breast was
performed

[R CC synth-2D]
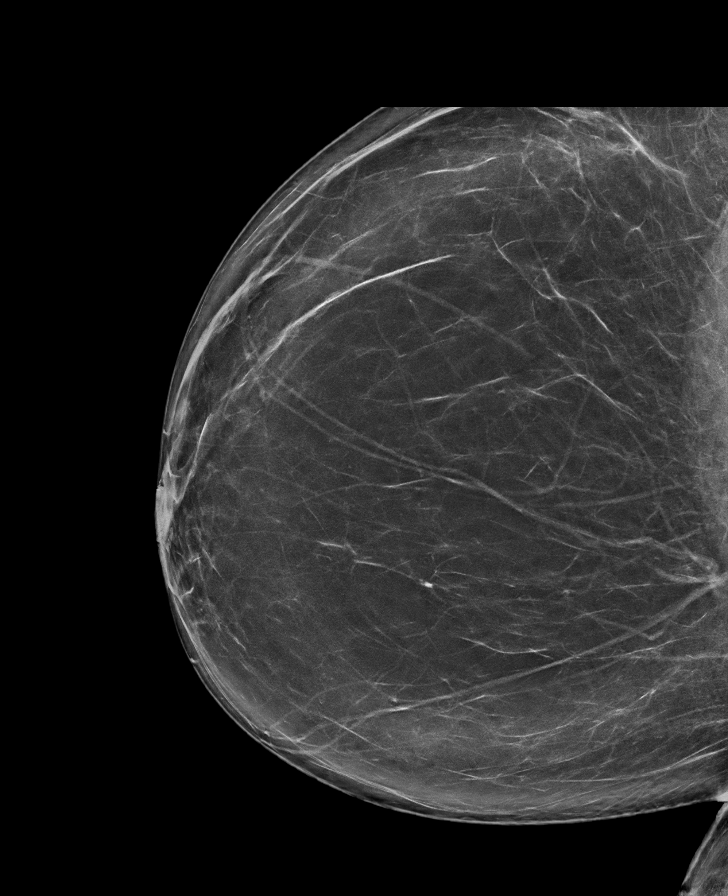

[L MLO synth-2D]
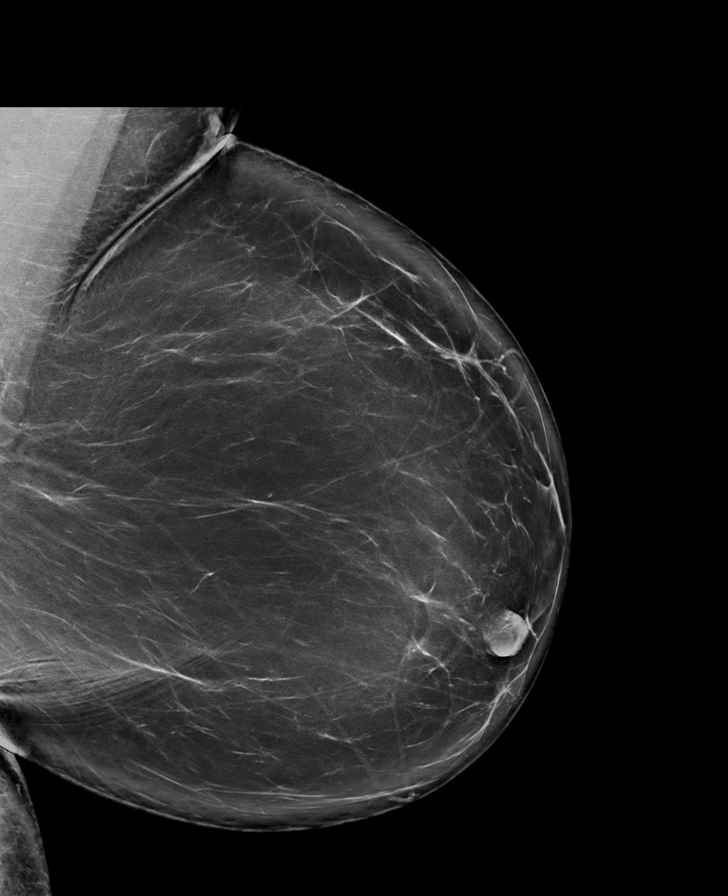

[R MLO synth-2D]
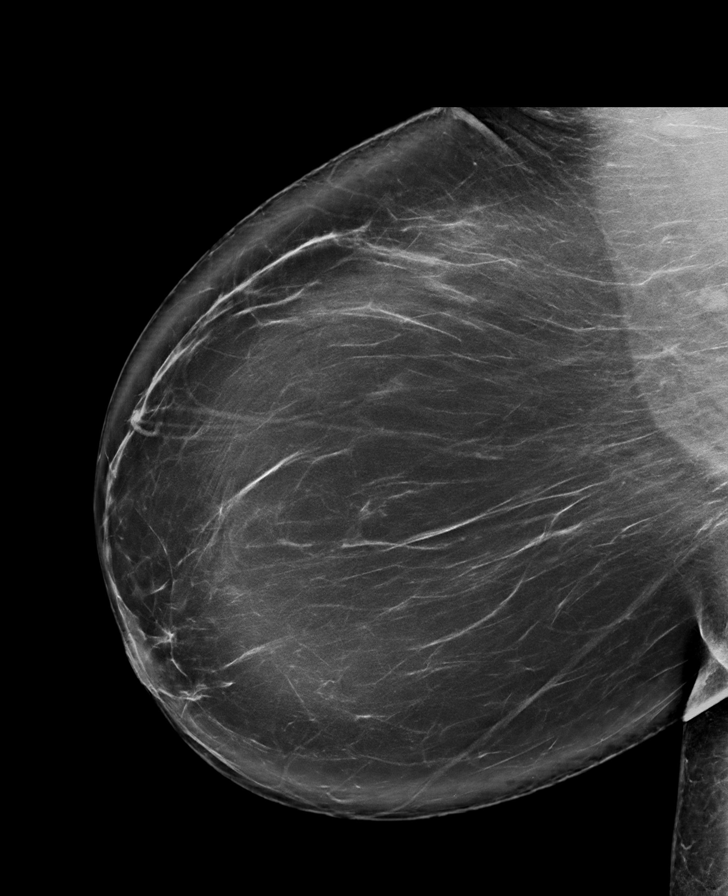

[L CC synth-2D]
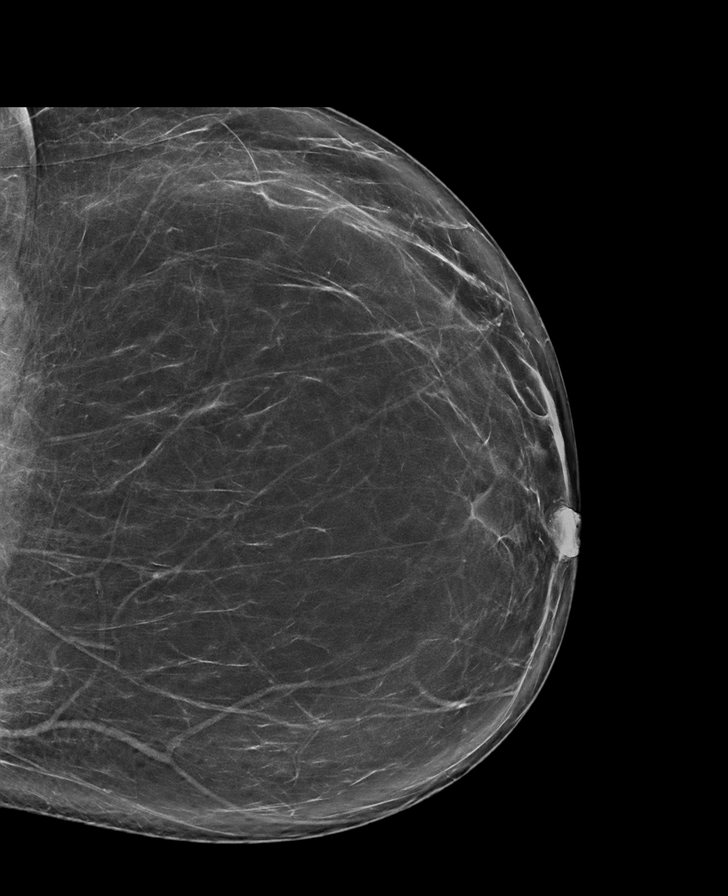

[R MLO tomo · tomo slice 55/109.0]
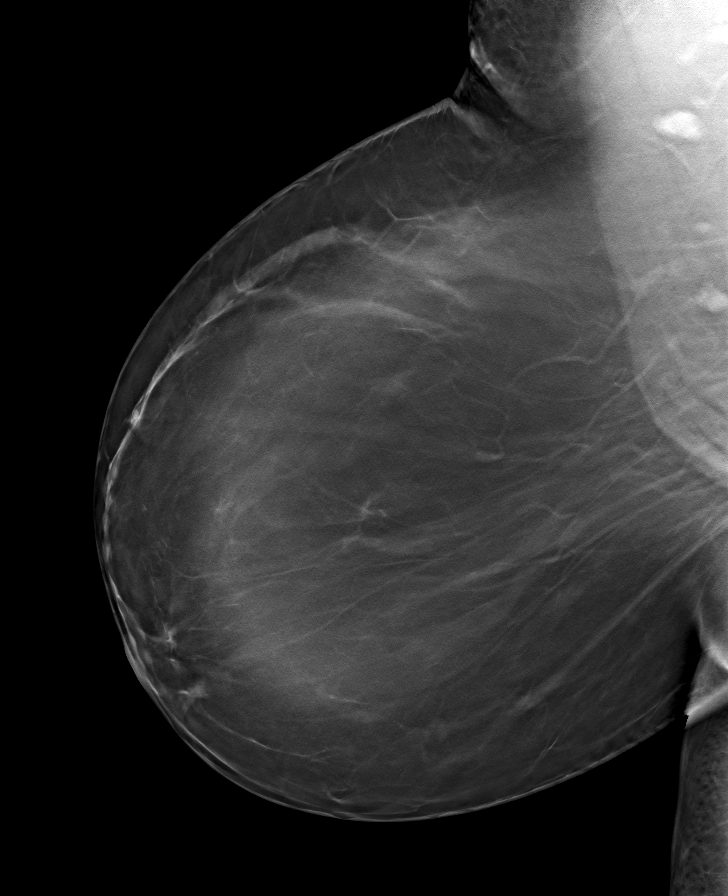

[R CC tomo · tomo slice 46/91.0]
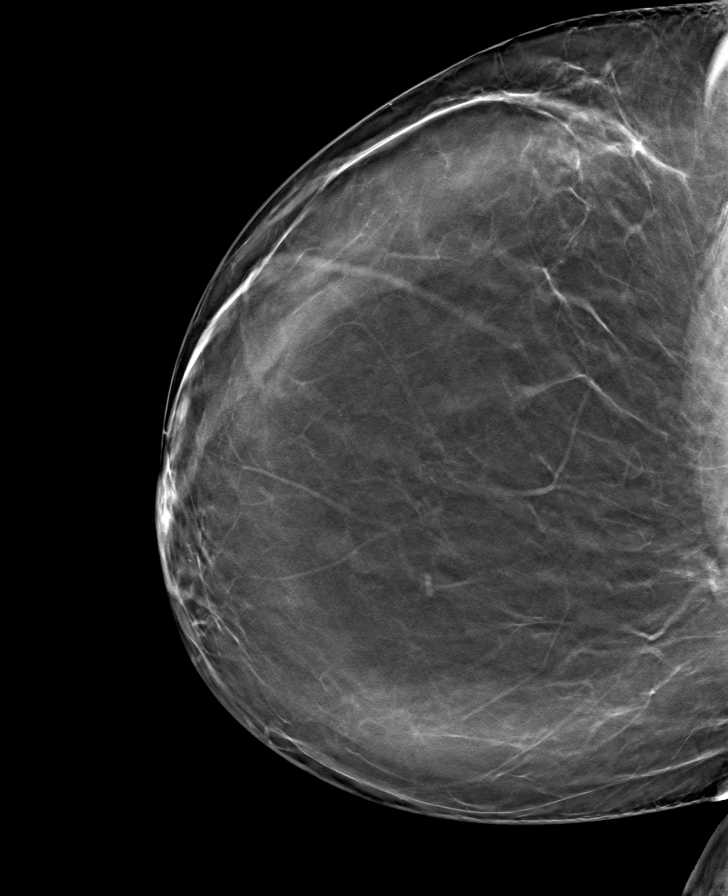

[L CC tomo · tomo slice 44/87.0]
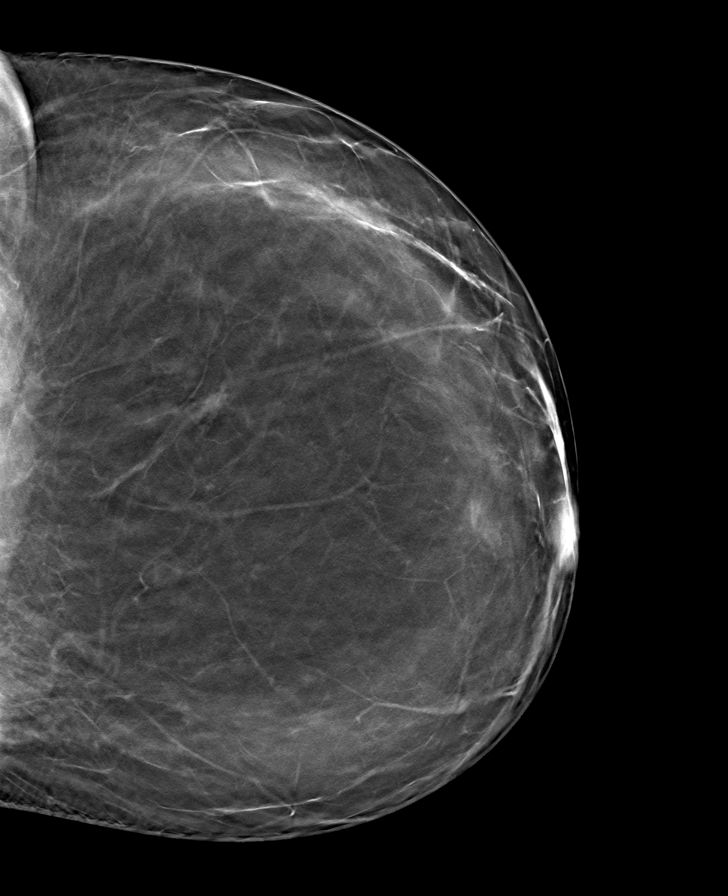

[L MLO tomo · tomo slice 55/108.0]
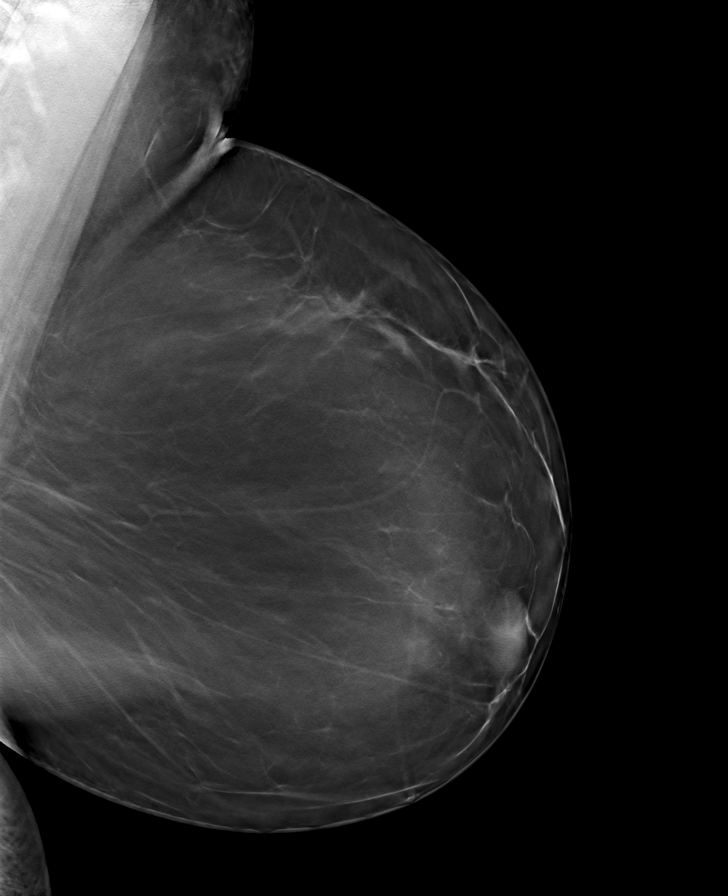

[8 of 24 positions shown; findings below may reference images not displayed]

ACR Breast Density Category b: There are scattered areas of
fibroglandular density.
FINDINGS: There are no new dominant masses, suspicious calcifications or
secondary signs of malignancy within either breast. Specifically,
there is no mammographic abnormality within the outer RIGHT breast
corresponding to the area of clinical concern.

On physical exam, I feel no fixed or circumscribed mass within the
lower outer quadrant of the RIGHT breast.

Targeted ultrasound is performed, evaluating the outer RIGHT breast
with particular attention to the lower outer quadrant as directed by
the patient, showing only normal fibroglandular tissues and fat
lobules throughout. No solid or cystic mass. No sebaceous cyst
within the skin.
IMPRESSION: 1. No evidence of malignancy within either breast.
2. Specifically, no evidence of malignancy or acute findings within
the outer RIGHT breast, corresponding to the area of patient's
clinical concern.

RECOMMENDATION:
1.  Screening mammogram in one year.(Code:[AJ])
2. Breast pain is a common condition which will often resolve on its
own without intervention. Benign causes of breast pain were
discussed with the patient. Breast pain can be improved by
over-the-counter pain medications such as Aspercreme, oral NSAIDs,
and topical NSAIDs. Studies have shown an improvement with use of
evening primrose oil and Vitamin E. The patient was encouraged to
follow-up with referring physician if the pain persisted or worsened
as this might indicate a need to evaluate the deeper structures of
the underlying chest wall. The patient was instructed to return
sooner if the area that she feels becomes larger and/or firmer to
palpation, or if a new palpable abnormality is identified in either
breast.

I have discussed the findings and recommendations with the patient.
If applicable, a reminder letter will be sent to the patient
regarding the next appointment.

BI-RADS CATEGORY  1: Negative.
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
## 2021-04-15 ENCOUNTER — Ambulatory Visit: Payer: BC Managed Care – PPO | Admitting: Sports Medicine
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
## 2021-04-21 ENCOUNTER — Other Ambulatory Visit: Payer: Self-pay

## 2021-04-21 ENCOUNTER — Ambulatory Visit: Payer: BC Managed Care – PPO | Admitting: Sports Medicine

## 2021-04-21 VITALS — BP 122/82 | HR 102 | Ht 64.0 in | Wt 217.0 lb

## 2021-04-21 DIAGNOSIS — M545 Low back pain, unspecified: Secondary | ICD-10-CM | POA: Diagnosis not present

## 2021-04-21 DIAGNOSIS — M797 Fibromyalgia: Secondary | ICD-10-CM

## 2021-04-21 DIAGNOSIS — M546 Pain in thoracic spine: Secondary | ICD-10-CM

## 2021-04-21 DIAGNOSIS — M542 Cervicalgia: Secondary | ICD-10-CM | POA: Diagnosis not present

## 2021-04-21 DIAGNOSIS — G8929 Other chronic pain: Secondary | ICD-10-CM

## 2021-04-21 MED ORDER — GABAPENTIN 100 MG PO CAPS: 100.0000 mg | ORAL_CAPSULE | Freq: Every evening | ORAL | 0 refills | Status: DC

## 2021-04-21 MED ORDER — DULOXETINE HCL 30 MG PO CPEP: 30.0000 mg | ORAL_CAPSULE | Freq: Every day | ORAL | 0 refills | Status: DC

## 2021-04-21 NOTE — Patient Instructions (Addendum)
Good to see you  ?Recommend starting gabapentin 100 mg nightly  ?Starting duloxetine 30 mg daily  ?Work note provided ?3 week follow up  ?

## 2021-04-26 ENCOUNTER — Telehealth: Payer: Self-pay | Admitting: Sports Medicine

## 2021-04-26 NOTE — Telephone Encounter (Signed)
Pt sent [facility] ppwk, giving to provider today-FYI. ? ?Pt asking about possible side effects from the 2 new meds rx'd 3/16. Cymbalta and Gabapentin. Pt experiencing frequest urination, constipation, painful gums, and vision changes. ?

## 2021-04-26 NOTE — Telephone Encounter (Signed)
Pt was called and told to stop taking the gabapentin but to continue using the duloxetine.  ?

## 2021-04-28 ENCOUNTER — Telehealth: Payer: Self-pay | Admitting: Sports Medicine

## 2021-04-28 ENCOUNTER — Ambulatory Visit: Payer: BC Managed Care – PPO
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
## 2021-04-28 NOTE — Telephone Encounter (Signed)
Pt experienced a rash on her face yesterday, along with itching. She took benadryl and a shower for relief. Concerned this is related to the Cymbalta, as she has discontinued the Gabapentin per our instructions. ? ?Pt has appt 3/24, just FYI in case their are concerns. ?

## 2021-04-29 ENCOUNTER — Ambulatory Visit: Payer: BC Managed Care – PPO | Admitting: Sports Medicine
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
## 2021-05-03 ENCOUNTER — Ambulatory Visit: Payer: BC Managed Care – PPO | Attending: Obstetrics and Gynecology

## 2021-05-03 ENCOUNTER — Ambulatory Visit (INDEPENDENT_AMBULATORY_CARE_PROVIDER_SITE_OTHER): Payer: BC Managed Care – PPO | Admitting: Sports Medicine

## 2021-05-03 ENCOUNTER — Other Ambulatory Visit: Payer: Self-pay

## 2021-05-03 VITALS — BP 120/80 | HR 106 | Ht 64.0 in | Wt 215.0 lb

## 2021-05-03 DIAGNOSIS — M545 Low back pain, unspecified: Secondary | ICD-10-CM

## 2021-05-03 DIAGNOSIS — M6281 Muscle weakness (generalized): Secondary | ICD-10-CM | POA: Diagnosis present

## 2021-05-03 DIAGNOSIS — M546 Pain in thoracic spine: Secondary | ICD-10-CM | POA: Diagnosis not present

## 2021-05-03 DIAGNOSIS — M542 Cervicalgia: Secondary | ICD-10-CM | POA: Diagnosis not present

## 2021-05-03 DIAGNOSIS — M62838 Other muscle spasm: Secondary | ICD-10-CM | POA: Diagnosis not present

## 2021-05-03 DIAGNOSIS — R279 Unspecified lack of coordination: Secondary | ICD-10-CM | POA: Insufficient documentation

## 2021-05-03 DIAGNOSIS — G8929 Other chronic pain: Secondary | ICD-10-CM

## 2021-05-03 DIAGNOSIS — M797 Fibromyalgia: Secondary | ICD-10-CM | POA: Diagnosis not present

## 2021-05-03 NOTE — Patient Instructions (Addendum)
Good to see you  ?Reach out to [employer] this Friday 05/06/2021 ask them If they received the paperwork from [place] office ask them how we can best get them that paperwork  ?And call us and let us know  ?Continue follow up with pelvic floor specialist  ?
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
## 2021-05-05 ENCOUNTER — Ambulatory Visit: Payer: BC Managed Care – PPO

## 2021-05-05 DIAGNOSIS — R279 Unspecified lack of coordination: Secondary | ICD-10-CM

## 2021-05-05 DIAGNOSIS — M62838 Other muscle spasm: Secondary | ICD-10-CM

## 2021-05-05 DIAGNOSIS — M6281 Muscle weakness (generalized): Secondary | ICD-10-CM
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
## 2021-05-05 NOTE — Patient Instructions (Signed)
Squatty potty: ?When your knees are level or below the level of your hips, pelvic floor ?muscles are pressed against rectum, preventing ease of bowel movement. ?By getting knees above the level of the hips, these pelvic floor muscles ?relax, allowing easier passage of bowel movement. ?? Ways to get knees above hips: ?o Physiological scientist (7inch and 9inch versions) ?o Small stool ?o Roll of toilet paper under each foot ?o Hardback book or stack of magazines under each foot ? ?Relaxed Toileting mechanics: ?Once in this position, make sure to lean forward with forearms on thighs, ?wide knees, relaxed stomach, and breathe. ? ? ?Bowel massage: ?To assist with more regular and more comfortable bowel movements, try ?performing bowel massage nightly for 5-10 minutes. ?Place hands in the lower right side of your abdomen to start; in small ?circles, massage up, across, and down the left side of your abdomen. ?Pressure does not need to be hard, but just comfortable. ?You can use lotion or oil to make more comfortable. ? ?Bladder/bowel retraining: ? ?8-12 grams of fiber with each meal ?After each meal, go attempt to have a bowel movement within 5 minutes and don?t sit on the ?toilet for more than 5 minutes ?Drink 4-8oz an hour ? ?For two weeks. ? ?Fiber: ground flax seed and chia seed ?Fiber: ? ?Examples of fiber rich foods: ?Soluble Fiber Insoluble Fiber  ?Strawberries Strawberries  ?Blueberries Blueberries  ?Apples Apples  ?Beans Peas  ?Nuts and Seeds Walnuts and Almonds  ?Oatmeal Whole grains  ?Avocados Avocados  ?Brussels sprouts Coconut  ?Sweet potatoes Radish  ?Broccoli Popcorn  ?Turnips Turnips  ?Apricots Passionfruit  ?Nectarines Cauliflower  ?Carrots Prune  ? ? ?

## 2021-05-09 ENCOUNTER — Telehealth: Payer: Self-pay | Admitting: Sports Medicine

## 2021-05-09 NOTE — Telephone Encounter (Signed)
[employer] paperwork has been re-faxed  05/09/2021 ?

## 2021-05-09 NOTE — Telephone Encounter (Signed)
Patient called to let Dr Glennon Mac know that [employer] states they have not received her paperwork. She said that she did hear back from the long term and they are working on that. ? ? ? ? ?

## 2021-05-10 ENCOUNTER — Ambulatory Visit: Payer: BC Managed Care – PPO | Attending: Obstetrics and Gynecology

## 2021-05-10 DIAGNOSIS — M6281 Muscle weakness (generalized): Secondary | ICD-10-CM | POA: Diagnosis present

## 2021-05-10 DIAGNOSIS — M62838 Other muscle spasm: Secondary | ICD-10-CM | POA: Insufficient documentation

## 2021-05-10 DIAGNOSIS — R279 Unspecified lack of coordination: Secondary | ICD-10-CM | POA: Diagnosis present
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
## 2021-05-11 NOTE — Addendum Note (Signed)
Addended by: Heather Roberts A on: 05/11/2021 04:01 PM ? ? Modules accepted: Orders ? ?

## 2021-05-12 ENCOUNTER — Telehealth: Payer: Self-pay | Admitting: Sports Medicine

## 2021-05-12 ENCOUNTER — Ambulatory Visit: Payer: BC Managed Care – PPO

## 2021-05-12 ENCOUNTER — Ambulatory Visit: Payer: BC Managed Care – PPO | Admitting: Sports Medicine

## 2021-05-12 DIAGNOSIS — M62838 Other muscle spasm: Secondary | ICD-10-CM

## 2021-05-12 DIAGNOSIS — R279 Unspecified lack of coordination: Secondary | ICD-10-CM

## 2021-05-12 DIAGNOSIS — M6281 Muscle weakness (generalized): Secondary | ICD-10-CM
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
## 2021-05-12 NOTE — Telephone Encounter (Signed)
Patient said that she was under the impression that the paperwork that Dr Glennon Mac filled out recently would cover her for being out of work due to all of the upcoming appointments she has. ?Her job said that she would also need a doctors note. ?She asked if another note could be written allowing her to be out of work until May 25th (her last visit with rehab is 5/24). ? ?Please advise. ? ? ? ? ?

## 2021-05-12 NOTE — Telephone Encounter (Signed)
Pt called, read Dr. Marisue Brooklyn response that no further [facility] notes will be written by Dr. Glennon Mac. ?

## 2021-05-12 NOTE — Telephone Encounter (Signed)
Pt called back to advise that per her employer, the note must come from Dr. Glennon Mac as he is the provider that took her out of work. She is struggling with PT and adverse medication reactions. ?

## 2021-05-16 ENCOUNTER — Ambulatory Visit: Payer: BC Managed Care – PPO

## 2021-05-16 DIAGNOSIS — M6281 Muscle weakness (generalized): Secondary | ICD-10-CM

## 2021-05-16 DIAGNOSIS — M62838 Other muscle spasm: Secondary | ICD-10-CM | POA: Diagnosis not present

## 2021-05-16 DIAGNOSIS — R279 Unspecified lack of coordination: Secondary | ICD-10-CM
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
## 2021-05-18 ENCOUNTER — Other Ambulatory Visit: Payer: Self-pay | Admitting: Sports Medicine

## 2021-05-19 ENCOUNTER — Ambulatory Visit: Payer: BC Managed Care – PPO

## 2021-05-19 DIAGNOSIS — M62838 Other muscle spasm: Secondary | ICD-10-CM

## 2021-05-19 DIAGNOSIS — R279 Unspecified lack of coordination: Secondary | ICD-10-CM

## 2021-05-19 DIAGNOSIS — M6281 Muscle weakness (generalized): Secondary | ICD-10-CM

## 2021-05-19 NOTE — Patient Instructions (Signed)
? ? ? ?  [facility] Welcome to [facility]! Here you will find all the information you will need regarding your pool therapy. If you have further questions at any time, please call our office at [phone]. After completing your initial evaluation in the [facility], you may be eligible to complete a portion of your therapy in the pool. A typical week of therapy will consist of 1-2 typical physical therapy visits at our [facility] location and an additional session of therapy in the pool located at the [facility] at [address]. [address], [place] [address]. The phone number at the pool site is [phone]. Please call this number if you are running late or need to cancel your appointment.  Aquatic therapy will be offered on Wednesday mornings and Friday afternoons. Each session will last approximately 45 minutes. All scheduling and payments for aquatic therapy sessions, including cancelations, will be done through our [facility] location.  To be eligible for aquatic therapy, these criteria must be met: You must be able to independently change in the locker room and get to the pool deck. A caregiver can come with you to help if needed. There are benches for a caregiver to sit on next to the pool. No one with an open wound is permitted in the pool.  Handicap parking is available in the front and there is a drop off option for even closer accessibility. Please arrive 15 minutes prior to your appointment to prepare for your pool session. You must sign in at the front desk upon your arrival. Please be sure to attend to any toileting needs prior to entering the pool. Locker rooms for changing are available.  There is direct access to the pool deck from the locker room. You can lock your belongings in a locker or bring them with you poolside. Your therapist will greet you on the pool deck. There may be other swimmers in the pool at the  same time but your session is one-on-one with the therapist.   
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
## 2021-05-23 ENCOUNTER — Ambulatory Visit (HOSPITAL_BASED_OUTPATIENT_CLINIC_OR_DEPARTMENT_OTHER): Payer: BC Managed Care – PPO | Admitting: Physical Therapy

## 2021-05-26 ENCOUNTER — Ambulatory Visit: Payer: BC Managed Care – PPO

## 2021-05-26 DIAGNOSIS — M62838 Other muscle spasm: Secondary | ICD-10-CM

## 2021-05-26 DIAGNOSIS — R279 Unspecified lack of coordination: Secondary | ICD-10-CM

## 2021-05-26 DIAGNOSIS — M6281 Muscle weakness (generalized): Secondary | ICD-10-CM
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
## 2021-05-26 NOTE — Patient Instructions (Signed)
Bladder/bowel retraining: ? ?Drink 4-8oz an hour ?ONLY WATER ?Stop water intake 3 hours before bed ?Try to go at least 2-3 hours between trips to the bathroom - go whether you need to or not. ? ?For two weeks. ? ?

## 2021-05-30 ENCOUNTER — Ambulatory Visit: Payer: BC Managed Care – PPO

## 2021-05-30 DIAGNOSIS — M62838 Other muscle spasm: Secondary | ICD-10-CM | POA: Diagnosis not present

## 2021-05-30 DIAGNOSIS — R279 Unspecified lack of coordination: Secondary | ICD-10-CM

## 2021-05-30 DIAGNOSIS — M6281 Muscle weakness (generalized): Secondary | ICD-10-CM
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
## 2021-06-02 ENCOUNTER — Ambulatory Visit: Payer: BC Managed Care – PPO

## 2021-06-02 DIAGNOSIS — M6281 Muscle weakness (generalized): Secondary | ICD-10-CM

## 2021-06-02 DIAGNOSIS — M62838 Other muscle spasm: Secondary | ICD-10-CM | POA: Diagnosis not present

## 2021-06-02 DIAGNOSIS — R279 Unspecified lack of coordination: Secondary | ICD-10-CM
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
## 2021-06-06 ENCOUNTER — Ambulatory Visit (HOSPITAL_BASED_OUTPATIENT_CLINIC_OR_DEPARTMENT_OTHER): Payer: BC Managed Care – PPO | Admitting: Physical Therapy

## 2021-06-06 ENCOUNTER — Telehealth: Payer: Self-pay

## 2021-06-06 NOTE — Telephone Encounter (Signed)
Note ?  ?Patient called and said she has a lot of information in her chart that she did not say and is not true.  She wants her chart changes.  She also said the chart has many missed DX back from when Donneta Romberg was seeing her and she has had treatment that documents these issues.   She is coming in for an appointment on 06/21/21 am for follow up.  The way she was questioning everything regarding her care, I told her it did not seem like she had satisfaction with you being her PCP.  I did suggest if she feels like this PCP maybe not a good fit for her we will be happy to send her records to another provider. She asked if she could see another provider in this office. I told her we did not change providers once assigned in PCP.  She asked if I could accompany her in the room with you during her appointment.  I told her I worked many positions in the office and I could not commit to being in the room for her appointment that day.   Actually that day I will probably be at the front desk. 1 person is in training and we have all 4 providers in that day.   Please let me know if you want me to do anything.  She does not seem happy at all.  She also complained about Keya's  "attitude" every time she calls the office.    ?  ? ?

## 2021-06-06 NOTE — Telephone Encounter (Signed)
Patient called and said she has a lot of information in her chart that she did not say and is not true.  She wants her chart changes.  She also said the chart has many missed DX back from when Donneta Romberg was seeing her and she has had treatment that documents these issues.   She is coming in for an appointment on 06/21/21 am for follow up.  The way she was questioning everything regarding her care, I told her it did not seem like she had satisfaction with you being her PCP.  I did suggest if she feels like this PCP maybe not a good fit for her we will be happy to send her records to another provider. She asked if she could see another provider in this office. I told her we did not change providers once assigned in PCP.  She asked if I could accompany her in the room with you during her appointment.  I told her I worked many positions in the office and I could not commit to being in the room for her appointment that day.   Actually that day I will probably be at the front desk. 1 person is in training and we have all 4 providers in that day.   Please let me know if you want me to do anything.  She does not seem happy at all.  She also complained about Keya's  "attitude" every time she calls the office.   ?

## 2021-06-07 ENCOUNTER — Ambulatory Visit (INDEPENDENT_AMBULATORY_CARE_PROVIDER_SITE_OTHER): Payer: BC Managed Care – PPO | Admitting: Clinical

## 2021-06-07 ENCOUNTER — Encounter (HOSPITAL_COMMUNITY): Payer: Self-pay

## 2021-06-07 ENCOUNTER — Telehealth: Payer: Self-pay

## 2021-06-07 DIAGNOSIS — F419 Anxiety disorder, unspecified: Secondary | ICD-10-CM | POA: Diagnosis not present

## 2021-06-07 DIAGNOSIS — F331 Major depressive disorder, recurrent, moderate: Secondary | ICD-10-CM | POA: Diagnosis not present

## 2021-06-07 NOTE — Telephone Encounter (Signed)
Called pt and LMOM for her to call me.  I need to update her on canceled appointment as well as I am sending her a document to request changes she says is incorrect in her chart.  I am mailing the change request today.  ?

## 2021-06-07 NOTE — Plan of Care (Signed)
Verbal Consent 

## 2021-06-07 NOTE — Telephone Encounter (Signed)
Called patient and let her know [facility] will not continue to be her PCP.  I gave her the phone #'s to 5 other PCP offices within 20 min of [place] and 3 being in [place].  She will follow up with the #'s and work on getting a new PCP.  I told her I had mailed a form for her to fill out where she was had questions in her Medical Record reporting.  That form was mailed today as well as her Dismiss letter from [facility].  [employer] ?
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
## 2021-06-08 ENCOUNTER — Ambulatory Visit (HOSPITAL_BASED_OUTPATIENT_CLINIC_OR_DEPARTMENT_OTHER): Payer: BC Managed Care – PPO | Admitting: Family Medicine

## 2021-06-08 ENCOUNTER — Ambulatory Visit: Payer: Self-pay

## 2021-06-08 ENCOUNTER — Ambulatory Visit: Payer: Self-pay | Admitting: *Deleted
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
## 2021-06-08 NOTE — Telephone Encounter (Deleted)
Error see NT encounter  ?
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
## 2021-06-09 ENCOUNTER — Emergency Department (HOSPITAL_COMMUNITY)
Admission: EM | Admit: 2021-06-09 | Discharge: 2021-06-09 | Disposition: A | Discharge status: Home / Self Care | Payer: BC Managed Care – PPO | Attending: Emergency Medicine | Admitting: Emergency Medicine

## 2021-06-09 ENCOUNTER — Encounter (HOSPITAL_COMMUNITY): Payer: Self-pay

## 2021-06-09 ENCOUNTER — Emergency Department (HOSPITAL_COMMUNITY): Payer: BC Managed Care – PPO

## 2021-06-09 ENCOUNTER — Ambulatory Visit: Payer: Self-pay | Admitting: *Deleted

## 2021-06-09 ENCOUNTER — Other Ambulatory Visit: Payer: Self-pay

## 2021-06-09 ENCOUNTER — Ambulatory Visit: Payer: Self-pay

## 2021-06-09 DIAGNOSIS — R079 Chest pain, unspecified: Secondary | ICD-10-CM | POA: Insufficient documentation

## 2021-06-09 DIAGNOSIS — M549 Dorsalgia, unspecified: Secondary | ICD-10-CM | POA: Diagnosis not present

## 2021-06-09 DIAGNOSIS — R109 Unspecified abdominal pain: Secondary | ICD-10-CM | POA: Insufficient documentation

## 2021-06-09 DIAGNOSIS — R531 Weakness: Secondary | ICD-10-CM | POA: Diagnosis not present

## 2021-06-09 DIAGNOSIS — R6883 Chills (without fever): Secondary | ICD-10-CM | POA: Diagnosis not present

## 2021-06-09 DIAGNOSIS — M542 Cervicalgia: Secondary | ICD-10-CM | POA: Insufficient documentation

## 2021-06-09 DIAGNOSIS — R519 Headache, unspecified: Secondary | ICD-10-CM | POA: Diagnosis not present

## 2021-06-09 LAB — URINALYSIS, ROUTINE W REFLEX MICROSCOPIC
Bilirubin Urine: NEGATIVE
Glucose, UA: NEGATIVE mg/dL
Hgb urine dipstick: NEGATIVE
Ketones, ur: NEGATIVE mg/dL
Nitrite: NEGATIVE
Protein, ur: NEGATIVE mg/dL
Specific Gravity, Urine: 1.027 (ref 1.005–1.030)
pH: 5 (ref 5.0–8.0)

## 2021-06-09 LAB — COMPREHENSIVE METABOLIC PANEL
ALT: 13 U/L (ref 0–44)
AST: 15 U/L (ref 15–41)
Albumin: 4.3 g/dL (ref 3.5–5.0)
Alkaline Phosphatase: 56 U/L (ref 38–126)
Anion gap: 8 (ref 5–15)
BUN: 13 mg/dL (ref 6–20)
CO2: 21 mmol/L — ABNORMAL LOW (ref 22–32)
Calcium: 9.1 mg/dL (ref 8.9–10.3)
Chloride: 105 mmol/L (ref 98–111)
Creatinine, Ser: 0.72 mg/dL (ref 0.44–1.00)
GFR, Estimated: >60 mL/min (ref >60)
Glucose, Bld: 90 mg/dL (ref 70–99)
Potassium: 4.1 mmol/L (ref 3.5–5.1)
Sodium: 134 mmol/L — ABNORMAL LOW (ref 135–145)
Total Bilirubin: 0.8 mg/dL (ref 0.3–1.2)
Total Protein: 8.1 g/dL (ref 6.5–8.1)

## 2021-06-09 LAB — CBC
HCT: 43.5 % (ref 36.0–46.0)
Hemoglobin: 13.6 g/dL (ref 12.0–15.0)
MCH: 26.7 pg (ref 26.0–34.0)
MCHC: 31.3 g/dL (ref 30.0–36.0)
MCV: 85.5 fL (ref 80.0–100.0)
Platelets: 288 10*3/uL (ref 150–400)
RBC: 5.09 MIL/uL (ref 3.87–5.11)
RDW: 13.8 % (ref 11.5–15.5)
WBC: 4.2 10*3/uL (ref 4.0–10.5)
nRBC: 0 % (ref 0.0–0.2)

## 2021-06-09 LAB — CK: Total CK: 94 U/L (ref 38–234)

## 2021-06-09 IMAGING — DX DG HIP (WITH OR WITHOUT PELVIS) 2-3V*R*
3 series · 3 of 3 positions shown · non-contrast
Comparison: None Available.

CLINICAL DATA: Right hip pain

EXAM:
DG HIP (WITH OR WITHOUT PELVIS) 3V RIGHT

[pelvis ap]
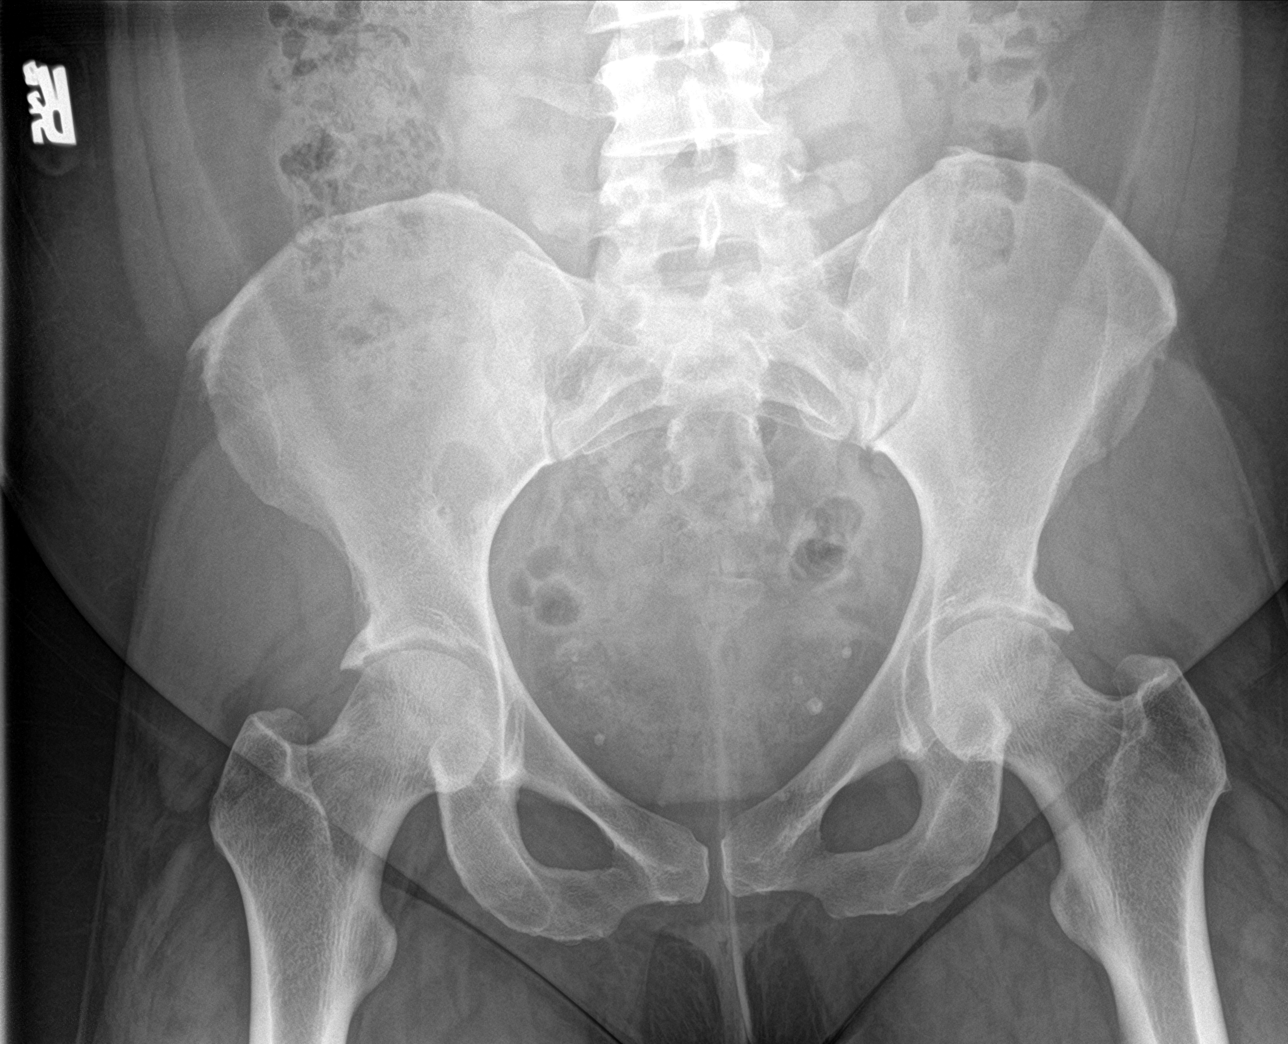

[hip ap]
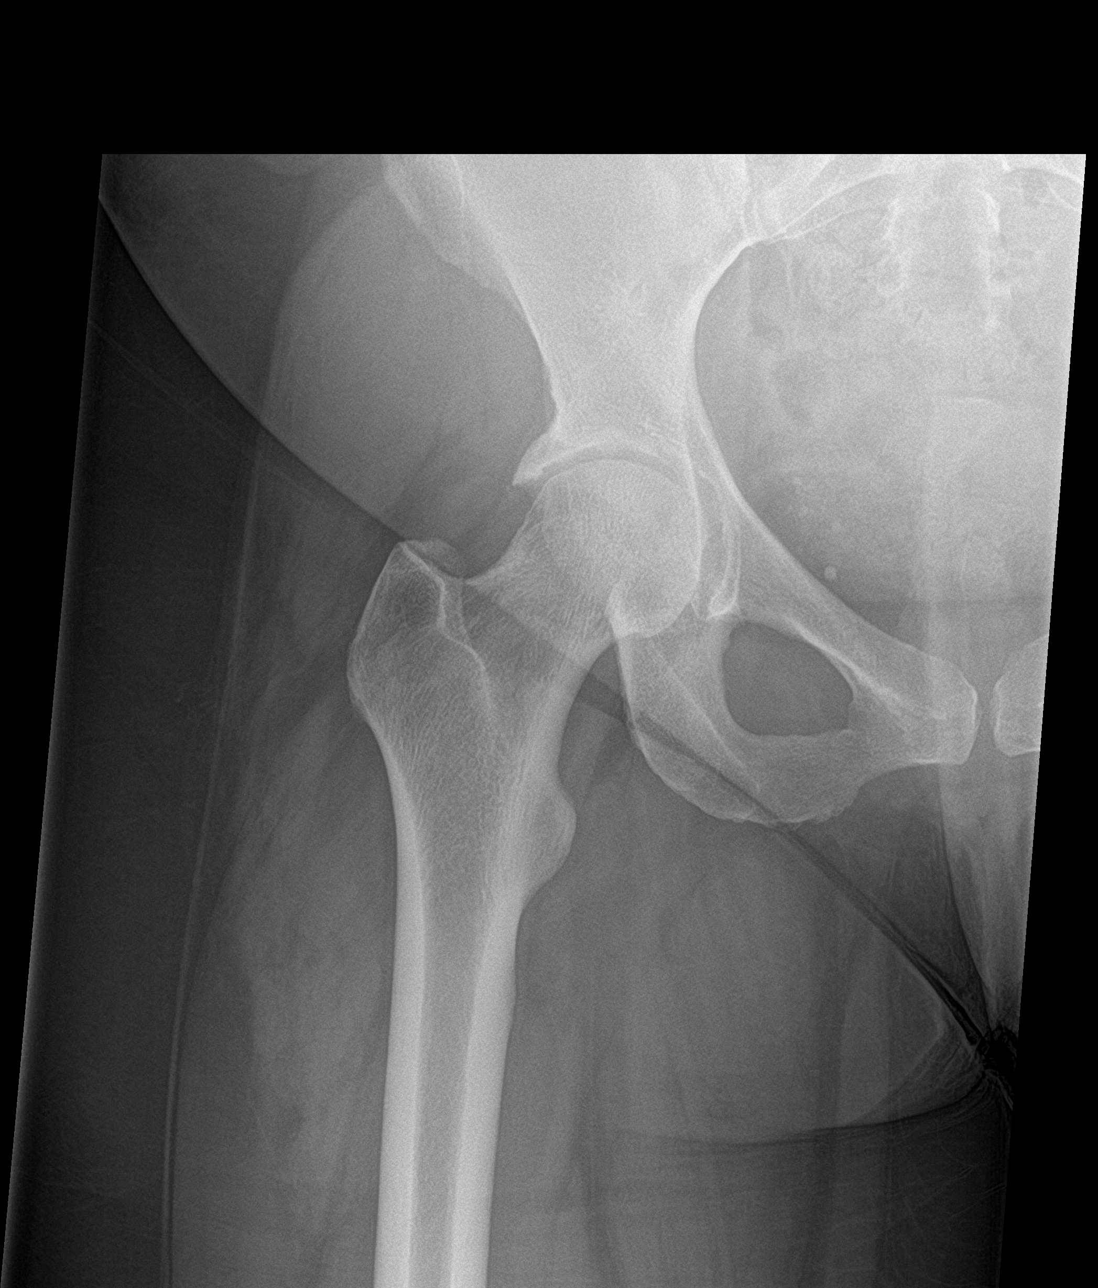

[hip frog leg]
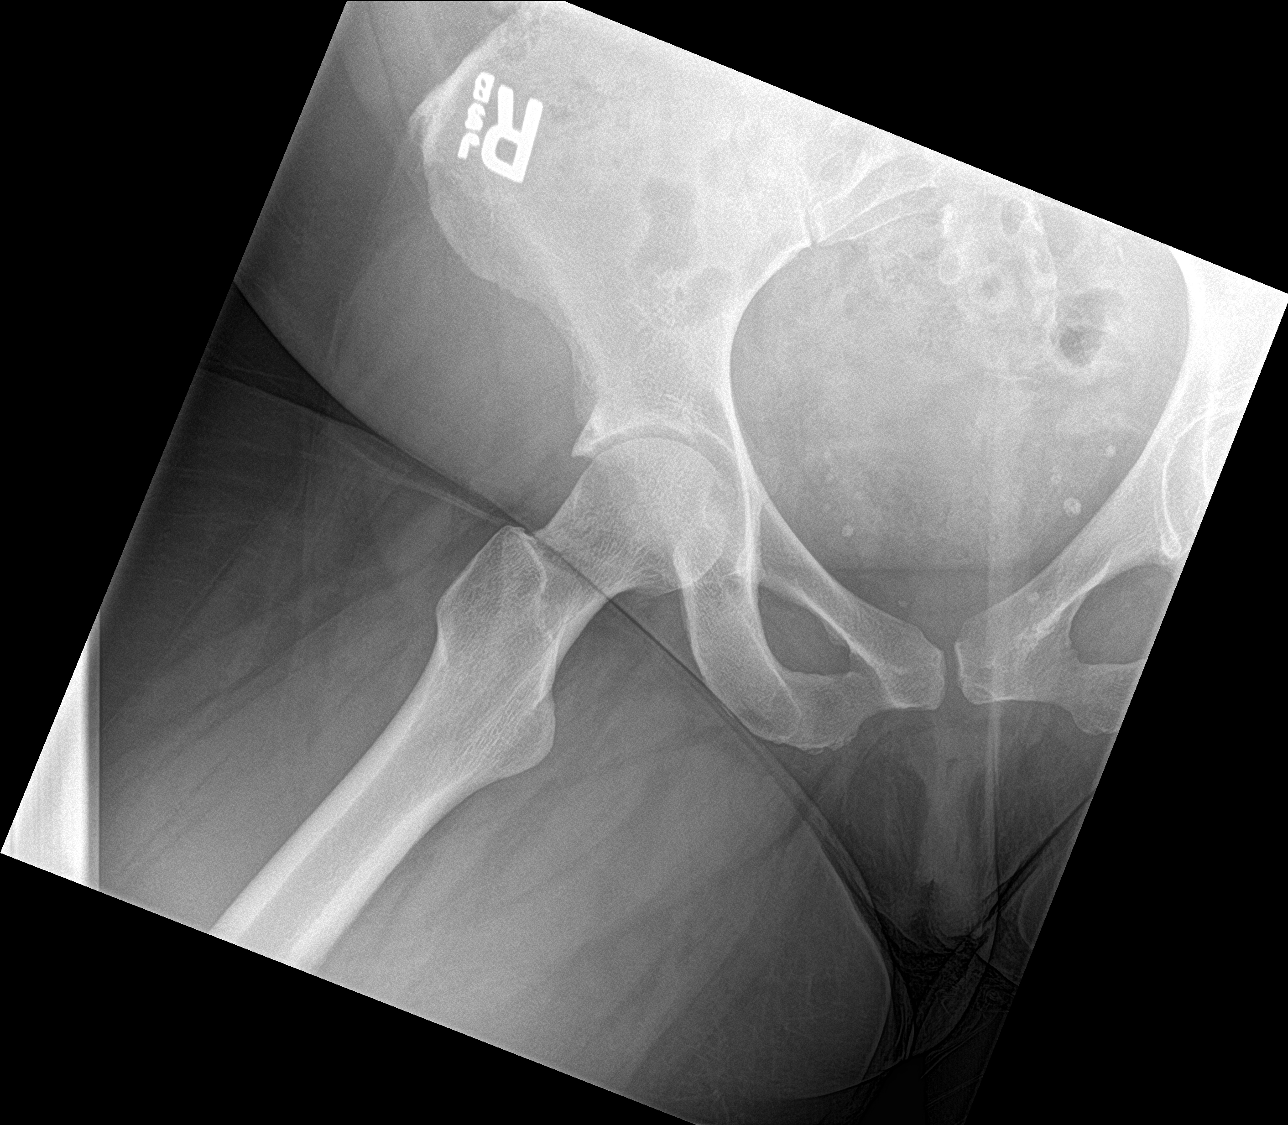

[3 of 3 positions shown; findings below may reference images not displayed]

FINDINGS: No recent fracture or dislocation is seen. Bony spurs seen in the
lateral aspects of both hips. Phleboliths are seen in the pelvis.
IMPRESSION: No fracture or dislocation is seen. Degenerative changes are noted
with bony spurs.

## 2021-06-09 IMAGING — CT CT HEAD W/O CM
4 series · 16 of 47 positions shown, 18 images · non-contrast
Comparison: None Available.

CLINICAL DATA: Headache, chronic, new features or increased
frequency



[Series 2: head w o · axial · 0.44mm/px · z∈[+39,+149]mm · 7 of 30 slices shown, 9 images]
[im 4/30  brain]
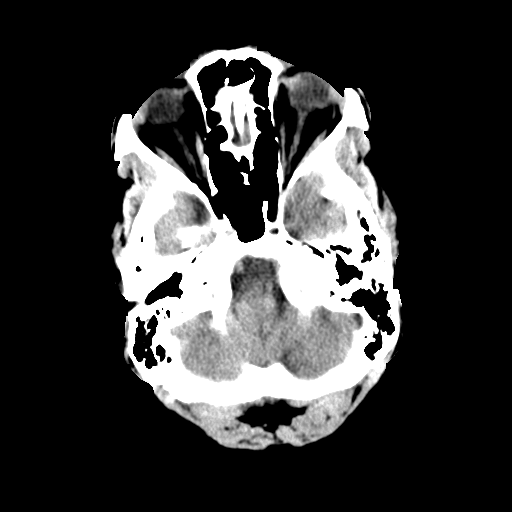
[im 4/30  bone]
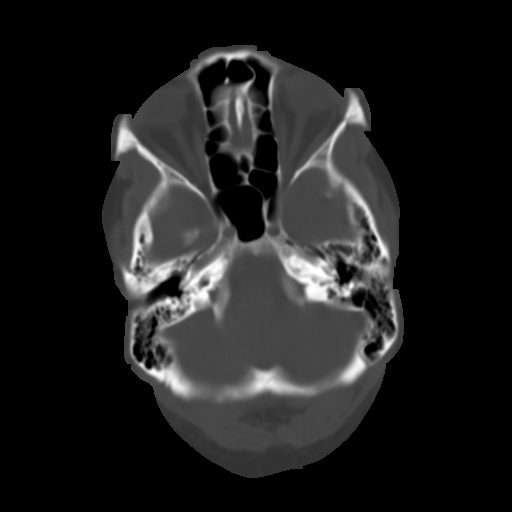
[im 8/30  brain]
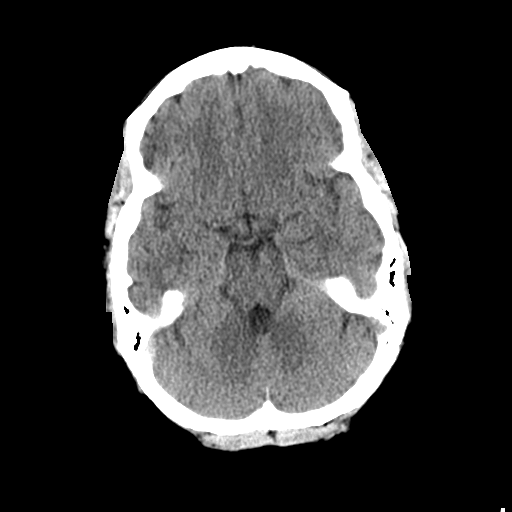
[im 11/30  brain]
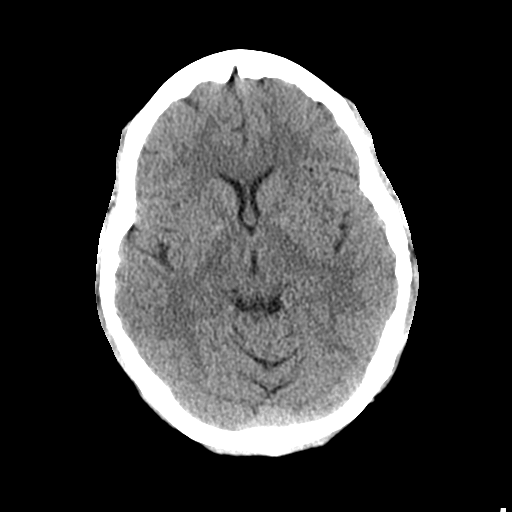
[im 15/30  brain]
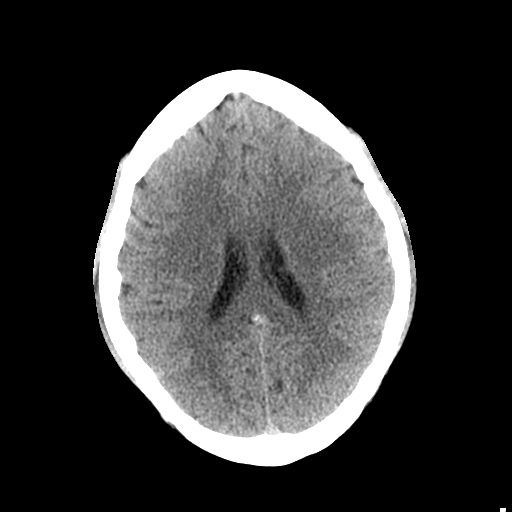
[im 19/30  brain]
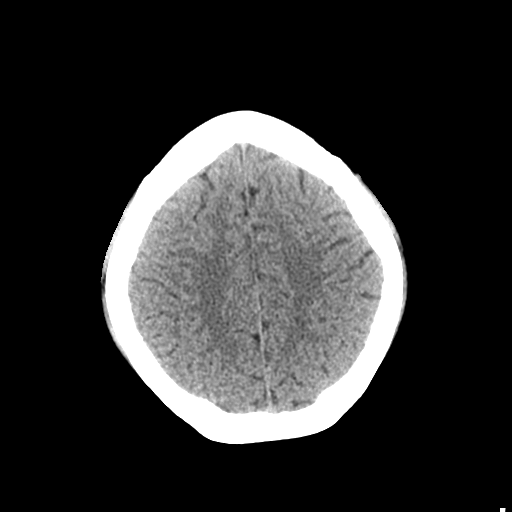
[im 19/30  bone]
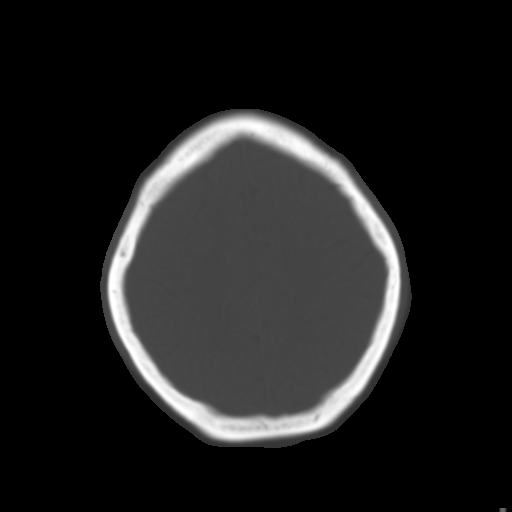
[im 22/30  brain]
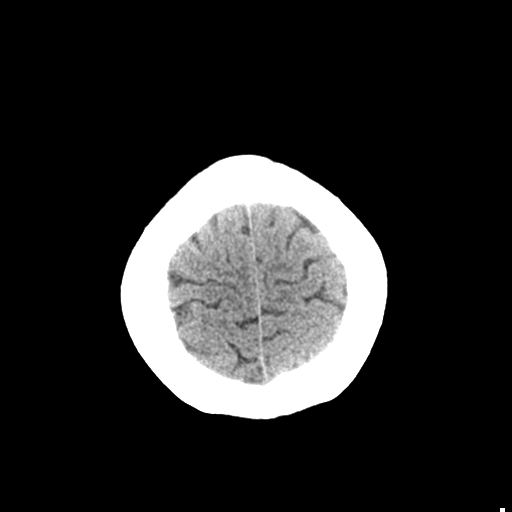
[im 26/30  brain]
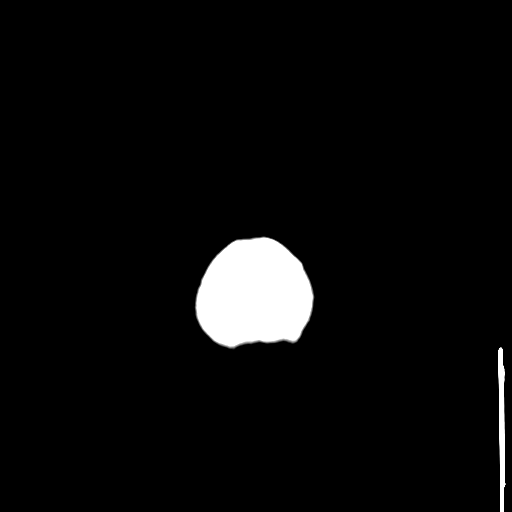

[Series 3: head bone · axial · 0.44mm/px · z∈[+38,+66]mm · 3 of 74 slices shown]
[im 8/74  bone]
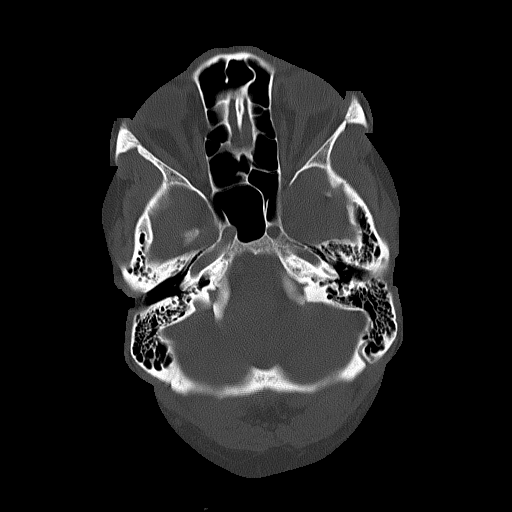
[im 15/74  bone]
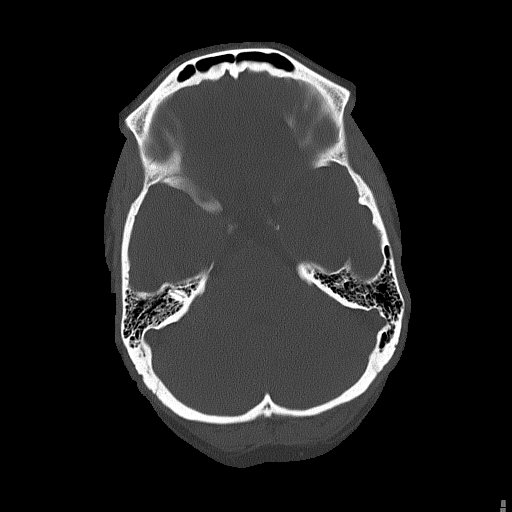
[im 22/74  bone]
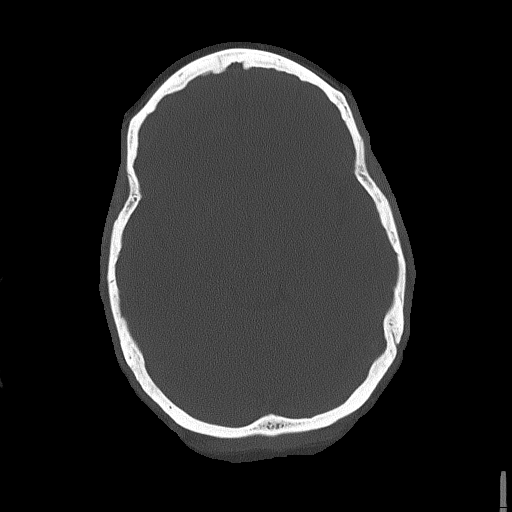

[Series 4: coronal soft · coronal · 0.32mm/px · 3 of 75 slices shown]
[im 25/75  brain]
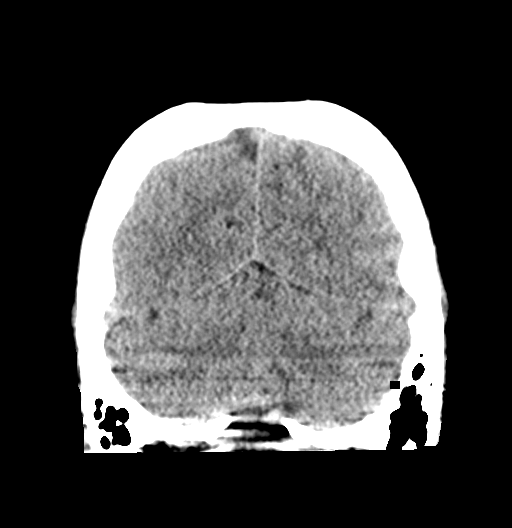
[im 33/75  brain]
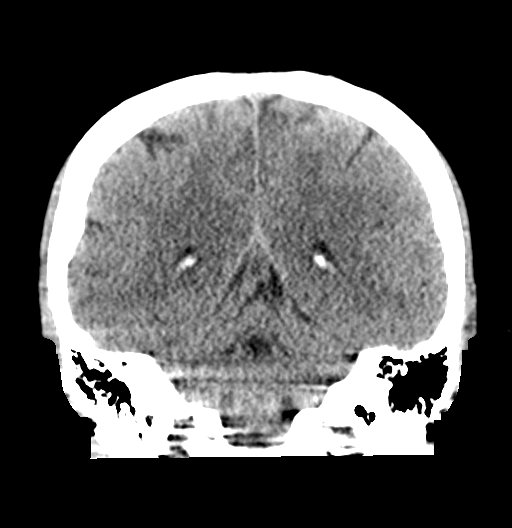
[im 42/75  brain]
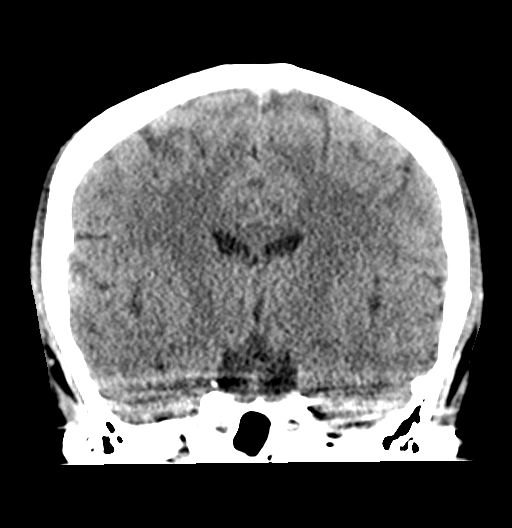

[Series 5: sagittal soft · sagittal · 0.34mm/px · 3 of 61 slices shown]
[im 21/61  brain]
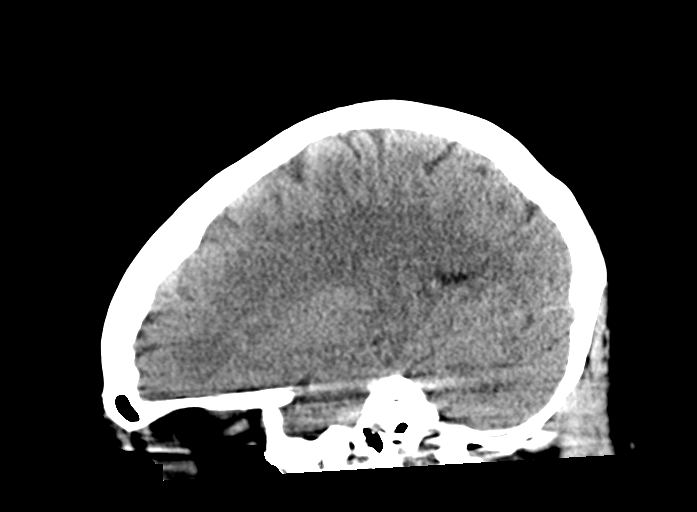
[im 31/61  brain]
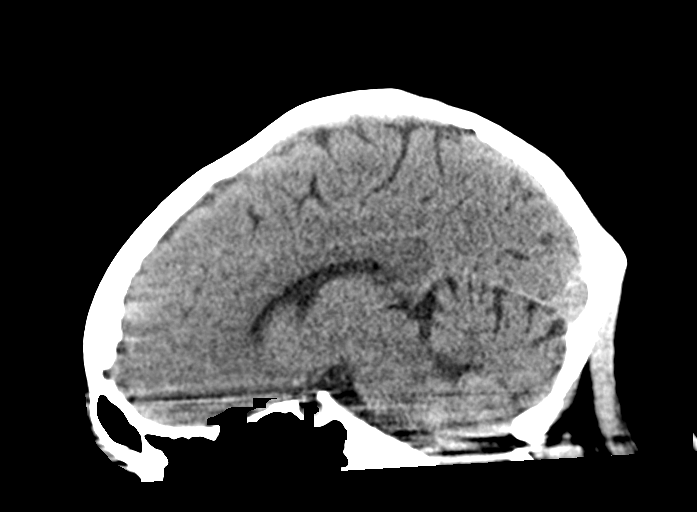
[im 41/61  brain]
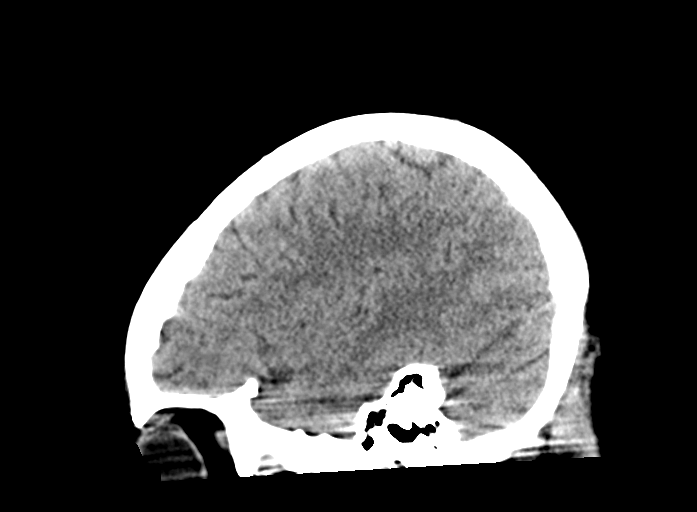

[16 of 47 positions shown; findings below may reference images not displayed]

FINDINGS: Brain: No evidence of acute intracranial hemorrhage or extra-axial
collection.No evidence of mass lesion/concerning mass effect.The
ventricles are normal in size.

Vascular: No hyperdense vessel or unexpected calcification.

Skull: Normal. Negative for fracture or focal lesion.

Sinuses/Orbits: Minimal ethmoid air cell mucosal thickening. The
mastoid air cells are clear. The orbits are unremarkable.

Other: None.
IMPRESSION: No acute intracranial abnormality.

## 2021-06-09 NOTE — Telephone Encounter (Signed)
Pt. Reports she talked with a nurse earlier today and was instructed to go to ED. Calling to report she is on her way now. ?

## 2021-06-09 NOTE — Discharge Instructions (Addendum)
Your work-up today was reassuring.  A urine culture has been sent however.  Follow-up with your doctor as needed. ?
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
## 2021-06-09 NOTE — Telephone Encounter (Signed)
Summary: repeat NT caller / pain  ? Pt has spoken with NT several times / she called back to schedule a new OBGYN appt with another provider/ during the conversation she mentioned pain several times and stated she was advised by Paulette to go to the hospital / please advise   ?  ?Attempted to call patient- left message to call triage nurses back- patient has been triaged today- see note/advice.  ?

## 2021-06-09 NOTE — Telephone Encounter (Signed)
?  Chief Complaint: dark urine - blood in urine - cloudy urine ?Symptoms: Dark urine, right side pain, frequent urination,incomplete BM ?Frequency: Since December ?Pertinent Negatives: Patient denies  ?Disposition: '[x]'$ ED /'[]'$ Urgent Care (no appt availability in office) / '[]'$ Appointment(In office/virtual)/ '[]'$  [facility]/ '[]'$ Home Care/ '[]'$ Refused Recommended Disposition /'[]'$ [facility]/ '[]'$  Follow-up with PCP ?Additional Notes: Pt  has had urination issue since December.  - urine is now dark - possibly with blood. Pt has frequent chills - has not taken temperature. Pt has unrinary frequency and right sided flank pain. Pt also states she is having incomplete feeling Bms and "her eyes don't look right". Also HA.  Pt is taking "Osha root" and [place]. PT is hesitant to go to UC or ED. ?Reason for Disposition ? Fever > 100.4 F (38.0 C) ? ?Answer Assessment - Initial Assessment Questions ?1. COLOR of URINE: "Describe the color of the urine."  (e.g., tea-colored, pink, red, blood clots, bloody) ?    Orange or "bloodish"-   ?2. ONSET: "When did the bleeding start?"  ?    " Awhile" - since December ?3. EPISODES: "How many times has there been blood in the urine?" or "How many times today?" ?    always ?4. PAIN with URINATION: "Is there any pain with passing your urine?" If Yes, ask: "How bad is the pain?"  (Scale 1-10; or mild, moderate, severe) ?   - MILD - complains slightly about urination hurting ?   - MODERATE - interferes with normal activities   ?   - SEVERE - excruciating, unwilling or unable to urinate because of the pain  ?    sometimes ?5. FEVER: "Do you have a fever?" If Yes, ask: "What is your temperature, how was it measured, and when did it start?" ?    Possibly - has chills ?6. ASSOCIATED SYMPTOMS: "Are you passing urine more frequently than usual?" ?    Yes - small amounts ?7. OTHER SYMPTOMS: "Do you have any other symptoms?" (e.g., back/flank pain, abdominal pain, vomiting) ?    Right  side ?8. PREGNANCY: "Is there any chance you are pregnant?" "When was your last menstrual period?" ?    na ? ?Protocols used: Urine - Blood In-A-AH ? ?
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
## 2021-06-10 ENCOUNTER — Encounter: Payer: Self-pay | Admitting: Radiology

## 2021-06-11 LAB — URINE CULTURE: Culture: <10000 — AB

## 2021-06-13 ENCOUNTER — Ambulatory Visit (HOSPITAL_BASED_OUTPATIENT_CLINIC_OR_DEPARTMENT_OTHER): Payer: BC Managed Care – PPO | Admitting: Physical Therapy

## 2021-06-14 ENCOUNTER — Encounter: Payer: Self-pay | Admitting: Nurse Practitioner

## 2021-06-16 ENCOUNTER — Ambulatory Visit: Payer: BC Managed Care – PPO | Attending: Obstetrics and Gynecology

## 2021-06-16 ENCOUNTER — Encounter: Payer: Self-pay | Admitting: Nurse Practitioner

## 2021-06-16 ENCOUNTER — Ambulatory Visit: Payer: BC Managed Care – PPO | Admitting: Nurse Practitioner

## 2021-06-16 VITALS — BP 114/81 | HR 98 | Temp 97.3°F | Ht 63.0 in | Wt 219.8 lb

## 2021-06-16 DIAGNOSIS — M62838 Other muscle spasm: Secondary | ICD-10-CM | POA: Diagnosis not present

## 2021-06-16 DIAGNOSIS — R279 Unspecified lack of coordination: Secondary | ICD-10-CM | POA: Diagnosis not present

## 2021-06-16 DIAGNOSIS — K5900 Constipation, unspecified: Secondary | ICD-10-CM | POA: Diagnosis not present

## 2021-06-16 DIAGNOSIS — G43909 Migraine, unspecified, not intractable, without status migrainosus: Secondary | ICD-10-CM

## 2021-06-16 DIAGNOSIS — M545 Low back pain, unspecified: Secondary | ICD-10-CM | POA: Diagnosis not present

## 2021-06-16 DIAGNOSIS — Z7689 Persons encountering health services in other specified circumstances: Secondary | ICD-10-CM

## 2021-06-16 DIAGNOSIS — M6281 Muscle weakness (generalized): Secondary | ICD-10-CM | POA: Insufficient documentation

## 2021-06-16 DIAGNOSIS — G8929 Other chronic pain: Secondary | ICD-10-CM | POA: Diagnosis not present

## 2021-06-16 MED ORDER — BACLOFEN 5 MG PO TABS: 5.0000 mg | ORAL_TABLET | Freq: Three times a day (TID) | ORAL | 1 refills | Status: DC | PRN

## 2021-06-16 MED ORDER — POLYETHYLENE GLYCOL 3350 17 GM/SCOOP PO POWD: 17.0000 g | Freq: Two times a day (BID) | ORAL | 1 refills | Status: DC | PRN

## 2021-06-16 MED ORDER — DOCUSATE SODIUM 50 MG PO CAPS
50.0000 mg | ORAL_CAPSULE | Freq: Two times a day (BID) | ORAL | 0 refills | Status: DC
Start: 2021-06-16 — End: 2021-07-18
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
## 2021-06-20 ENCOUNTER — Encounter (HOSPITAL_BASED_OUTPATIENT_CLINIC_OR_DEPARTMENT_OTHER): Payer: Self-pay | Admitting: Physical Therapy

## 2021-06-20 ENCOUNTER — Ambulatory Visit (HOSPITAL_BASED_OUTPATIENT_CLINIC_OR_DEPARTMENT_OTHER): Payer: BC Managed Care – PPO | Attending: Podiatry | Admitting: Physical Therapy

## 2021-06-20 DIAGNOSIS — R279 Unspecified lack of coordination: Secondary | ICD-10-CM

## 2021-06-20 DIAGNOSIS — M6289 Other specified disorders of muscle: Secondary | ICD-10-CM | POA: Insufficient documentation

## 2021-06-20 DIAGNOSIS — M62838 Other muscle spasm: Secondary | ICD-10-CM

## 2021-06-20 DIAGNOSIS — M6281 Muscle weakness (generalized): Secondary | ICD-10-CM
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
## 2021-06-21 ENCOUNTER — Ambulatory Visit: Payer: BC Managed Care – PPO | Admitting: Nurse Practitioner

## 2021-06-23 ENCOUNTER — Telehealth: Payer: Self-pay | Admitting: *Deleted

## 2021-06-23 ENCOUNTER — Ambulatory Visit: Payer: BC Managed Care – PPO

## 2021-06-23 DIAGNOSIS — M62838 Other muscle spasm: Secondary | ICD-10-CM

## 2021-06-23 DIAGNOSIS — R279 Unspecified lack of coordination: Secondary | ICD-10-CM

## 2021-06-23 DIAGNOSIS — M6281 Muscle weakness (generalized): Secondary | ICD-10-CM
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
## 2021-06-23 NOTE — Telephone Encounter (Signed)
Pt is having right pain on right side and needs Biofreeze I have informed pt she will need appt for Leonna to address the right pain

## 2021-06-23 NOTE — Telephone Encounter (Signed)
Patient thinks she is having an allergic reaction to medication.  She says she is itching all over and is unable to sleep. No trouble breathing and no rash noted.   She has stopped the Miralax and Colace.  She says the medications weren't really helping because she is still straining and not a lot comes out.  She will be at an appointment at 2 pm, so she won't be able to talk then.

## 2021-06-24 ENCOUNTER — Telehealth (HOSPITAL_COMMUNITY): Payer: Self-pay

## 2021-06-24 NOTE — Telephone Encounter (Signed)
Patient is reaching out again to get guidance. She is very upset that we aren't calling her back. She is aware that Barbee Shropshire is off today but would really like to know what she is supposed to do in this emergency situation.   CB# [phone]

## 2021-06-24 NOTE — Telephone Encounter (Signed)
Left message to return call. My chart message sent also

## 2021-06-24 NOTE — Telephone Encounter (Signed)
Appointment - Telephone call with pt to follow up on her concerns for a recent visit with Maye Hides, therapist and concerns for bill that day. Pt reported she was happy with therapist and his services but questioned the period of time she was billed. Patient stated she wished she could continue to see provider but due to cost and what her insurance is covering, at this time that will not be possible.  Patient has already starting seeing a therapist with [facility] for 3 sessions and stated her insurance and them at that time if more needed would assist with options that may be less expensive.  Agreed to follow up on charges from 06/07/21 and will report back to patient in the coming week.

## 2021-06-24 NOTE — Telephone Encounter (Signed)
Patient spoke to and Dr. Lacinda Axon consulted.  Pt has an appt on 06/27/21

## 2021-06-27 ENCOUNTER — Ambulatory Visit (HOSPITAL_COMMUNITY): Payer: Self-pay | Admitting: Clinical

## 2021-06-27 ENCOUNTER — Ambulatory Visit: Payer: BC Managed Care – PPO | Admitting: Nurse Practitioner

## 2021-06-27 ENCOUNTER — Telehealth (HOSPITAL_COMMUNITY): Payer: Self-pay

## 2021-06-27 ENCOUNTER — Encounter: Payer: Self-pay | Admitting: Nurse Practitioner

## 2021-06-27 VITALS — BP 99/69 | HR 106 | Ht 63.0 in | Wt 219.0 lb

## 2021-06-27 DIAGNOSIS — M545 Low back pain, unspecified: Secondary | ICD-10-CM | POA: Diagnosis not present

## 2021-06-27 DIAGNOSIS — D225 Melanocytic nevi of trunk: Secondary | ICD-10-CM | POA: Diagnosis not present

## 2021-06-27 DIAGNOSIS — L989 Disorder of the skin and subcutaneous tissue, unspecified: Secondary | ICD-10-CM | POA: Diagnosis not present

## 2021-06-27 DIAGNOSIS — G8929 Other chronic pain: Secondary | ICD-10-CM | POA: Diagnosis not present

## 2021-06-27 LAB — POCT URINALYSIS DIPSTICK
Bilirubin, UA: NEGATIVE
Blood, UA: NEGATIVE
Glucose, UA: NEGATIVE
Ketones, UA: NEGATIVE
Leukocytes, UA: NEGATIVE
Nitrite, UA: NEGATIVE
Odor: NORMAL
Protein, UA: NEGATIVE
Spec Grav, UA: 1.015 (ref 1.010–1.025)
Urobilinogen, UA: 0.2 E.U./dL
pH, UA: 6 (ref 5.0–8.0)

## 2021-06-27 MED ORDER — TRIAMCINOLONE ACETONIDE 0.1 % EX CREA: 1.0000 "application " | TOPICAL_CREAM | Freq: Two times a day (BID) | CUTANEOUS | 0 refills | Status: DC

## 2021-06-27 MED ORDER — BACLOFEN 5 MG PO TABS: 5.0000 mg | ORAL_TABLET | Freq: Three times a day (TID) | ORAL | 1 refills | Status: DC | PRN
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
## 2021-06-27 NOTE — Telephone Encounter (Signed)
Medication managemement - Telephone call back with patient to follow up on her status with reported concerns for the remainder of her bill from her initial comprehensive clinical assessment from 06/08/21.  Informed patient her bill that day was not based on the time she was with the counselor but for the service type delivered, the CCA which would be the same cost from 16-90 minutes as long as all aspects of the CCA were reviewed.  Also, informed patient our Surveyor, quantity, had gone back to ensure her [employer] was appropriately billed as this was not filed initially.   Informed patient this nurse manager could not tell her what her remaining cost would be but would be based on what is left between her insurance and her following their allowance for the cost of an initial clinical assessment.  Patient to call back later if any further issues or questions and stated understanding of current charges and being refilled under her verified insurance.

## 2021-06-28 ENCOUNTER — Encounter (HOSPITAL_BASED_OUTPATIENT_CLINIC_OR_DEPARTMENT_OTHER): Payer: Self-pay | Admitting: Physical Therapy

## 2021-06-28 ENCOUNTER — Ambulatory Visit (HOSPITAL_BASED_OUTPATIENT_CLINIC_OR_DEPARTMENT_OTHER): Payer: BC Managed Care – PPO | Admitting: Physical Therapy

## 2021-06-28 DIAGNOSIS — M62838 Other muscle spasm: Secondary | ICD-10-CM

## 2021-06-28 DIAGNOSIS — M6281 Muscle weakness (generalized): Secondary | ICD-10-CM

## 2021-06-28 DIAGNOSIS — M6289 Other specified disorders of muscle: Secondary | ICD-10-CM | POA: Diagnosis not present

## 2021-06-28 DIAGNOSIS — R279 Unspecified lack of coordination: Secondary | ICD-10-CM
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
## 2021-06-29 ENCOUNTER — Ambulatory Visit: Payer: BC Managed Care – PPO | Admitting: Nurse Practitioner

## 2021-06-30 ENCOUNTER — Ambulatory Visit (HOSPITAL_BASED_OUTPATIENT_CLINIC_OR_DEPARTMENT_OTHER): Payer: Self-pay | Admitting: Physical Therapy

## 2021-07-05 ENCOUNTER — Ambulatory Visit (HOSPITAL_BASED_OUTPATIENT_CLINIC_OR_DEPARTMENT_OTHER): Payer: BC Managed Care – PPO | Admitting: Physical Therapy

## 2021-07-11 ENCOUNTER — Ambulatory Visit (HOSPITAL_BASED_OUTPATIENT_CLINIC_OR_DEPARTMENT_OTHER): Payer: BC Managed Care – PPO | Attending: Podiatry | Admitting: Physical Therapy

## 2021-07-11 ENCOUNTER — Encounter (HOSPITAL_BASED_OUTPATIENT_CLINIC_OR_DEPARTMENT_OTHER): Payer: Self-pay | Admitting: Physical Therapy

## 2021-07-11 DIAGNOSIS — M25572 Pain in left ankle and joints of left foot: Secondary | ICD-10-CM

## 2021-07-11 DIAGNOSIS — M6281 Muscle weakness (generalized): Secondary | ICD-10-CM

## 2021-07-11 DIAGNOSIS — R279 Unspecified lack of coordination: Secondary | ICD-10-CM | POA: Diagnosis not present

## 2021-07-11 DIAGNOSIS — M25571 Pain in right ankle and joints of right foot: Secondary | ICD-10-CM | POA: Diagnosis not present

## 2021-07-11 DIAGNOSIS — M62838 Other muscle spasm: Secondary | ICD-10-CM | POA: Diagnosis not present
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
## 2021-07-18 ENCOUNTER — Ambulatory Visit: Payer: BC Managed Care – PPO | Admitting: Nurse Practitioner

## 2021-07-18 ENCOUNTER — Encounter: Payer: Self-pay | Admitting: Nurse Practitioner

## 2021-07-18 VITALS — BP 106/76 | HR 85 | Temp 97.5°F | Ht 63.0 in | Wt 218.0 lb

## 2021-07-18 DIAGNOSIS — R350 Frequency of micturition: Secondary | ICD-10-CM | POA: Diagnosis not present

## 2021-07-18 DIAGNOSIS — G8929 Other chronic pain: Secondary | ICD-10-CM

## 2021-07-18 DIAGNOSIS — L280 Lichen simplex chronicus: Secondary | ICD-10-CM | POA: Diagnosis not present

## 2021-07-18 DIAGNOSIS — M545 Low back pain, unspecified: Secondary | ICD-10-CM

## 2021-07-18 LAB — POCT URINALYSIS DIP (MANUAL ENTRY)
Bilirubin, UA: NEGATIVE
Glucose, UA: NEGATIVE mg/dL
Ketones, POC UA: NEGATIVE mg/dL
Leukocytes, UA: NEGATIVE
Nitrite, UA: NEGATIVE
Protein Ur, POC: NEGATIVE mg/dL
Spec Grav, UA: >=1.03 — AB (ref 1.010–1.025)
Urobilinogen, UA: 0.2 E.U./dL
pH, UA: 5 (ref 5.0–8.0)

## 2021-07-18 NOTE — Progress Notes (Signed)
Subjective:    Patient ID: Meghan Carter, female    DOB: 1980-02-22, 41 y.o.   MRN: [id number]  HPI Follow up for lower back and abd pain , upper back and neck discomfort.  Patient was trialed on baclofen for back pain however patient stopped baclofen stating that it was causing her to itch.  Patient continues to do aqua therapy which patient states helps a lot.  Patient states that she does not feel any pain while she is on the water.  Patient reports still experiencing urinary freq, vaginal itching, incomplete emptying of the bladder, skin itching.  Denies vaginal discharges, vaginal odors.  Patient seen GYN for heavy periods and regular check ups has upcoming gyn procedure.  Patient has hysteroscopy scheduled for July 18 and postop appointment scheduled on August 14 to evaluate for endometriosis versus adenomyosis versus fibroids.    Review of Systems  Genitourinary:  Positive for dysuria.  Musculoskeletal:  Positive for back pain.       Objective:   Physical Exam Vitals reviewed.  Constitutional:      General: She is not in acute distress.    Appearance: Normal appearance. She is obese. She is not ill-appearing, toxic-appearing or diaphoretic.  HENT:     Head: Normocephalic and atraumatic.  Cardiovascular:     Rate and Rhythm: Normal rate and regular rhythm.     Pulses: Normal pulses.     Heart sounds: Normal heart sounds. No murmur heard. Pulmonary:     Effort: Pulmonary effort is normal. No respiratory distress.     Breath sounds: Normal breath sounds. No wheezing.  Musculoskeletal:     Comments: Grossly intact.  Range of motion to back intact however tenderness upon movement  Skin:    General: Skin is warm.     Capillary Refill: Capillary refill takes less than 2 seconds.  Neurological:     Mental Status: She is alert.     Comments: Grossly intact  Psychiatric:        Mood and Affect: Mood normal.        Behavior: Behavior normal.           Assessment &  Plan:   1. Urinary frequency -Urine culture from June 2 resulted as positive on June 12 for group B strep. -Patient has allergy to amoxicillin (itching) and is generally very sensitive to medications. -We will start patient on Keflex and also order hydroxyzine to help prevent itchiness. -Patient states that she already takes Benadryl which does not help.  Patient to stop taking Benadryl and start hydroxyzine. - POCT urinalysis dipstick - Urine Culture pending - cephALEXin (KEFLEX) 500 MG capsule; Take 1 capsule (500 mg total) by mouth 2 (two) times daily for 7 days.  Dispense: 14 capsule; Refill: 0 -Return to clinic if symptoms are not better within 2 to 3 days of antibiotic use.  2. Lichen simplex -Patient provided provider with copy of an old dermatology note which suggested patient has lichen simplex which may explain patient's constant itching -Patient given contact information from dermatology referral and encouraged to contact dermatology office to schedule appointment -We will also trial Vistaril to see if it helps with itchiness and possibly anxiety - hydrOXYzine (VISTARIL) 25 MG capsule; Take 1 capsule (25 mg total) by mouth every 8 (eight) hours as needed.  Dispense: 30 capsule; Refill: 0 -Return to clinic in 10 weeks or sooner if needed  3. Chronic midline low back pain without sciatica -Patient to continue with aqua  therapy -Patient had question about using the chiropractor.  However encourage patient to continue with her aqua therapy and pelvic therapy and then we can introduce physical therapy or chiropractor after her other therapy is finished. -Continue to follow-up with GYN for endometriosis versus adenomyosis versus fibroid management -Return to clinic in 10 weeks or sooner if needed  3.  Vaginal itching -Suspect that lichen simplex may be contributing to vaginal itching -We will trial patient on the Atarax to see if it helps -Also urinary tract infection may be causing  vaginal irritation -If patient does not have relief we will consider doing a pelvic exam and a wet mount to check for yeast -May also consider providing patient with steroid cream if pelvic exam negative for yeast infection -Return to clinic if symptoms do not improve or worsen  Note:  This document was prepared using Dragon voice recognition software and may include unintentional dictation errors. Note - This record has been created using [employer].  Chart creation errors have been sought, but may not always  have been located. Such creation errors do not reflect on  the standard of medical care.

## 2021-07-19 ENCOUNTER — Encounter: Payer: Self-pay | Admitting: Nurse Practitioner

## 2021-07-19 MED ORDER — HYDROXYZINE PAMOATE 25 MG PO CAPS
25.0000 mg | ORAL_CAPSULE | Freq: Three times a day (TID) | ORAL | 0 refills | Status: DC | PRN
Start: 2021-07-19 — End: 2021-08-10

## 2021-07-19 MED ORDER — CEPHALEXIN 500 MG PO CAPS: 500.0000 mg | ORAL_CAPSULE | Freq: Two times a day (BID) | ORAL | 0 refills | Status: DC

## 2021-07-19 MED ORDER — CEPHALEXIN 500 MG PO CAPS: 500.0000 mg | ORAL_CAPSULE | Freq: Two times a day (BID) | ORAL | 0 refills | Status: AC

## 2021-07-20 LAB — URINE CULTURE: Organism ID, Bacteria: NO GROWTH

## 2021-07-20 LAB — SPECIMEN STATUS REPORT

## 2021-07-20 NOTE — Progress Notes (Signed)
Spoke with patient via telephone number on file. Patient tolerating the Keflex "ok" but it is causing itchiness. Patient states that Atarax does help with itching. Through shared decision making we decided there was no benefit of taking antibiotic since her urine culture was clear.   Patient informed to return to clinic for evaluation if dysuria, frequency, and/or vaginal itching continues. Patient stated understanding.

## 2021-07-21 ENCOUNTER — Ambulatory Visit: Payer: BC Managed Care – PPO | Attending: Obstetrics and Gynecology

## 2021-07-21 DIAGNOSIS — M62838 Other muscle spasm: Secondary | ICD-10-CM | POA: Insufficient documentation

## 2021-07-21 DIAGNOSIS — R279 Unspecified lack of coordination: Secondary | ICD-10-CM | POA: Diagnosis not present

## 2021-07-21 DIAGNOSIS — M6281 Muscle weakness (generalized): Secondary | ICD-10-CM | POA: Insufficient documentation

## 2021-07-21 NOTE — Patient Instructions (Addendum)
Mindfulness: A common source of pelvic floor muscle tension is stress and anxiety. Mindfulness training has been shown to be beneficial in reducing stress and anxiety by increasing bodily awareness and learning to relax unwanted tension. A good starting mindfulness practice is by Meghan Carter; follow instructions below to access free recorded sessions: Type Meghan Carter into browser, go to website and click guided meditation, listen now, pick "progressive relaxation" or "relaxation body scan" If you do not like these sessions, try typing the above titles into YouTube for hundreds of options! A great YouTube option is Meghan Carter: 10 minute body scan Mindfulness is a very Tree surgeon; therefore, it can take time and trial and error in order to find what is most beneficial for you. There are also apps you can pay to use: [facility]   [facility] [address], [place], [place] [address] Phone # [phone] Fax [phone]
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
## 2021-07-22 ENCOUNTER — Encounter (HOSPITAL_BASED_OUTPATIENT_CLINIC_OR_DEPARTMENT_OTHER): Payer: Self-pay | Admitting: Physical Therapy

## 2021-07-22 ENCOUNTER — Ambulatory Visit (HOSPITAL_BASED_OUTPATIENT_CLINIC_OR_DEPARTMENT_OTHER): Payer: BC Managed Care – PPO | Admitting: Physical Therapy

## 2021-07-22 DIAGNOSIS — M6281 Muscle weakness (generalized): Secondary | ICD-10-CM

## 2021-07-22 DIAGNOSIS — M62838 Other muscle spasm: Secondary | ICD-10-CM

## 2021-07-22 DIAGNOSIS — M25572 Pain in left ankle and joints of left foot: Secondary | ICD-10-CM | POA: Diagnosis not present

## 2021-07-22 DIAGNOSIS — M25571 Pain in right ankle and joints of right foot: Secondary | ICD-10-CM | POA: Diagnosis not present

## 2021-07-22 DIAGNOSIS — R279 Unspecified lack of coordination: Secondary | ICD-10-CM | POA: Diagnosis not present
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
## 2021-07-28 ENCOUNTER — Ambulatory Visit (HOSPITAL_BASED_OUTPATIENT_CLINIC_OR_DEPARTMENT_OTHER): Payer: BC Managed Care – PPO | Admitting: Physical Therapy

## 2021-07-28 ENCOUNTER — Ambulatory Visit: Payer: BC Managed Care – PPO

## 2021-07-28 ENCOUNTER — Encounter (HOSPITAL_BASED_OUTPATIENT_CLINIC_OR_DEPARTMENT_OTHER): Payer: Self-pay | Admitting: Physical Therapy

## 2021-07-28 DIAGNOSIS — M25572 Pain in left ankle and joints of left foot: Secondary | ICD-10-CM | POA: Diagnosis not present

## 2021-07-28 DIAGNOSIS — R279 Unspecified lack of coordination: Secondary | ICD-10-CM

## 2021-07-28 DIAGNOSIS — M62838 Other muscle spasm: Secondary | ICD-10-CM | POA: Diagnosis not present

## 2021-07-28 DIAGNOSIS — M6281 Muscle weakness (generalized): Secondary | ICD-10-CM

## 2021-07-28 DIAGNOSIS — M25571 Pain in right ankle and joints of right foot: Secondary | ICD-10-CM | POA: Diagnosis not present
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
## 2021-07-31 ENCOUNTER — Encounter (HOSPITAL_BASED_OUTPATIENT_CLINIC_OR_DEPARTMENT_OTHER): Payer: Self-pay | Admitting: Physical Therapy

## 2021-07-31 ENCOUNTER — Encounter: Payer: Self-pay | Admitting: Nurse Practitioner

## 2021-08-01 ENCOUNTER — Ambulatory Visit: Payer: Self-pay | Admitting: Nurse Practitioner

## 2021-08-03 ENCOUNTER — Ambulatory Visit (HOSPITAL_BASED_OUTPATIENT_CLINIC_OR_DEPARTMENT_OTHER): Payer: BC Managed Care – PPO | Admitting: Physical Therapy

## 2021-08-04 ENCOUNTER — Ambulatory Visit: Payer: BC Managed Care – PPO

## 2021-08-10 ENCOUNTER — Ambulatory Visit (HOSPITAL_BASED_OUTPATIENT_CLINIC_OR_DEPARTMENT_OTHER): Payer: Medicaid Other | Attending: Podiatry | Admitting: Physical Therapy

## 2021-08-10 ENCOUNTER — Ambulatory Visit (INDEPENDENT_AMBULATORY_CARE_PROVIDER_SITE_OTHER): Payer: Medicaid Other | Admitting: Nurse Practitioner

## 2021-08-10 ENCOUNTER — Encounter (HOSPITAL_BASED_OUTPATIENT_CLINIC_OR_DEPARTMENT_OTHER): Payer: Self-pay | Admitting: Physical Therapy

## 2021-08-10 ENCOUNTER — Encounter: Payer: Self-pay | Admitting: Nurse Practitioner

## 2021-08-10 VITALS — BP 108/70 | HR 97 | Temp 97.9°F | Ht 63.0 in | Wt 222.0 lb

## 2021-08-10 DIAGNOSIS — N898 Other specified noninflammatory disorders of vagina: Secondary | ICD-10-CM | POA: Diagnosis not present

## 2021-08-10 DIAGNOSIS — R300 Dysuria: Secondary | ICD-10-CM

## 2021-08-10 DIAGNOSIS — N809 Endometriosis, unspecified: Secondary | ICD-10-CM

## 2021-08-10 DIAGNOSIS — L439 Lichen planus, unspecified: Secondary | ICD-10-CM

## 2021-08-10 DIAGNOSIS — M62838 Other muscle spasm: Secondary | ICD-10-CM | POA: Diagnosis not present

## 2021-08-10 DIAGNOSIS — R279 Unspecified lack of coordination: Secondary | ICD-10-CM | POA: Insufficient documentation

## 2021-08-10 DIAGNOSIS — M6281 Muscle weakness (generalized): Secondary | ICD-10-CM | POA: Insufficient documentation

## 2021-08-10 LAB — POCT URINALYSIS DIP (MANUAL ENTRY)
Bilirubin, UA: NEGATIVE
Glucose, UA: NEGATIVE mg/dL
Ketones, POC UA: NEGATIVE mg/dL
Leukocytes, UA: NEGATIVE
Nitrite, UA: NEGATIVE
Protein Ur, POC: NEGATIVE mg/dL
Spec Grav, UA: >=1.03 — AB (ref 1.010–1.025)
Urobilinogen, UA: 0.2 E.U./dL
pH, UA: 5 (ref 5.0–8.0)

## 2021-08-10 LAB — POCT WET PREP (WET MOUNT)
Clue Cells Wet Prep Whiff POC: NEGATIVE
Trichomonas Wet Prep HPF POC: ABSENT

## 2021-08-10 MED ORDER — HYDROXYZINE PAMOATE 25 MG PO CAPS: 25.0000 mg | ORAL_CAPSULE | Freq: Three times a day (TID) | ORAL | 1 refills | Status: DC | PRN

## 2021-08-10 MED ORDER — CLOBETASOL PROPIONATE 0.05 % EX CREA: 1.0000 | TOPICAL_CREAM | Freq: Two times a day (BID) | CUTANEOUS | 0 refills | Status: DC

## 2021-08-10 NOTE — Progress Notes (Signed)
Subjective:    Patient ID: Meghan Carter, female    DOB: 05/10/1980, 41 y.o.   MRN: [id number]  HPI  Vaginal itching Patient reports to clinic today for vaginal itching for several months.  Patient also states that she has an odor that is comes and goes.  Patient states that she sometimes notices the odor after she is scratches her vaginal area because it itches.    Patient also states that she feels that her vagina has been dry.  Patient denies any vaginal discharge.  Burning with urination  Patient also reports to clinic today with complaints of dysuria for several months.  Patient was treated prophylactically for UTI on July 18, 2021 with Keflex 500 mg times 7 days.  However her urine culture did not show a UTI on June 14 and her antibiotics were stopped at that time.    Despite negative urine culture patient states that she continues to have burning with urination.  Endometriosis Patient continues to do water therapy for pelvic pain and back pain.  Patient states that is helpful.  Patient has had a hysteroscopy with possible sampling of endometrium and/or polypectomy with or without D&C scheduled on August 23, 2021.  Lichen Simplex Patient was prescribed hydroxyzine for Lichen simplex.  Patient states that it does help to decrease her itchiness but she still states that she feels rashes on her skin.  Patient states that she would like to continue with taking hydroxyzine.  Review of Systems  All other systems reviewed and are negative.      Objective:   Physical Exam Vitals reviewed. Exam conducted with a chaperone present.  Constitutional:      General: She is not in acute distress.    Appearance: Normal appearance. She is obese. She is not ill-appearing, toxic-appearing or diaphoretic.  HENT:     Head: Normocephalic and atraumatic.  Abdominal:     Hernia: There is no hernia in the left inguinal area or right inguinal area.  Genitourinary:    Exam position: Lithotomy  position.     Pubic Area: No rash or pubic lice.      Tanner stage (genital): 5.     Labia:        Right: Lesion present. No rash, tenderness or injury.        Left: Lesion present. No rash, tenderness or injury.      Urethra: No prolapse, urethral pain, urethral swelling or urethral lesion.     Vagina: Normal. No signs of injury and foreign body. No vaginal discharge, erythema, tenderness, bleeding, lesions or prolapsed vaginal walls.     Cervix: No cervical motion tenderness, discharge, friability, lesion, erythema, cervical bleeding or eversion.     Uterus: Normal. Tender. Not deviated, not enlarged, not fixed and no uterine prolapse.      Comments: 2 small grayish-whitish lesions noted to patient's bilateral labia majora.  Lesions located anteriorly on the inside of both her left and right labia majora. Both lesions are less than 1 cm. Non-tender. Itchy. No swelling noted.   Uterus slightly tender to palpation.  Unable to assess adnexa due to habitus.  Rectal grossly intact. Musculoskeletal:     Comments: Grossly intact  Lymphadenopathy:     Lower Body: No right inguinal adenopathy. No left inguinal adenopathy.  Skin:    General: Skin is warm.     Capillary Refill: Capillary refill takes less than 2 seconds.  Neurological:     Mental Status: She is alert.  Comments: Grossly intact  Psychiatric:        Mood and Affect: Mood normal.        Behavior: Behavior normal.        Assessment & Plan:   1. 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  Dispense: 90 capsule; Refill: 1 - clobetasol cream (TEMOVATE) 0.05 %; Apply 1 Application topically 2 (two) times daily.  Dispense: 30 g; Refill: 0 -Would like for patient to be assessed by GYN.  Patient states that she would like to have the GYN, Dr. Wannetta Sender, that she is seeing on August 4 take a look at it. -We will forward note to Dr. Wannetta Sender  3. Dysuria - Possibly related to Lichen Planus, however patient to be schedule to evaluated by GYN - Point-of-care urinalysis negative -Specific gravity high. -Patient encouraged to drink more water - POCT urinalysis dipstick -Follow-up with GYN on August 4  4. Endometriosis -Discussed endometriosis in detail to include discussions about anatomy and causes. -Patient to continue following up with GYN at [facility] for Hystoscopy.  -Discussed pros and cons of medication management versus hysterectomy -Patient to continue following up with GYN [facility] - Follow up in 1 month    Note:  This document was prepared using Dragon voice recognition software and may include unintentional dictation errors. Note - This record has been created using [employer].  Chart creation errors have been sought, but may not always  have been located. Such creation errors do not reflect on  the standard of medical care.
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
## 2021-08-12 LAB — CHLAMYDIA/GONOCOCCUS/TRICHOMONAS, NAA
Chlamydia by NAA: NEGATIVE
Gonococcus by NAA: NEGATIVE
Trich vag by NAA: NEGATIVE

## 2021-08-14 LAB — VAGINAL YEAST CULTURE: Vaginal Yeast Culture: NEGATIVE

## 2021-08-18 ENCOUNTER — Encounter: Payer: Self-pay | Admitting: Nurse Practitioner

## 2021-08-19 ENCOUNTER — Ambulatory Visit (HOSPITAL_BASED_OUTPATIENT_CLINIC_OR_DEPARTMENT_OTHER): Payer: Medicaid Other | Admitting: Physical Therapy

## 2021-09-01 ENCOUNTER — Ambulatory Visit: Payer: BC Managed Care – PPO

## 2021-09-06 ENCOUNTER — Ambulatory Visit (INDEPENDENT_AMBULATORY_CARE_PROVIDER_SITE_OTHER): Payer: BC Managed Care – PPO | Admitting: Nurse Practitioner

## 2021-09-06 ENCOUNTER — Encounter: Payer: Self-pay | Admitting: Nurse Practitioner

## 2021-09-06 VITALS — BP 100/68 | HR 96 | Temp 98.1°F | Ht 63.0 in | Wt 227.2 lb

## 2021-09-06 DIAGNOSIS — N809 Endometriosis, unspecified: Secondary | ICD-10-CM | POA: Diagnosis not present

## 2021-09-06 DIAGNOSIS — N3090 Cystitis, unspecified without hematuria: Secondary | ICD-10-CM

## 2021-09-06 DIAGNOSIS — F419 Anxiety disorder, unspecified: Secondary | ICD-10-CM

## 2021-09-06 DIAGNOSIS — F321 Major depressive disorder, single episode, moderate: Secondary | ICD-10-CM

## 2021-09-06 DIAGNOSIS — R109 Unspecified abdominal pain: Secondary | ICD-10-CM

## 2021-09-06 DIAGNOSIS — R202 Paresthesia of skin: Secondary | ICD-10-CM

## 2021-09-06 DIAGNOSIS — D219 Benign neoplasm of connective and other soft tissue, unspecified: Secondary | ICD-10-CM | POA: Diagnosis not present

## 2021-09-06 DIAGNOSIS — R200 Anesthesia of skin: Secondary | ICD-10-CM

## 2021-09-06 LAB — POCT URINALYSIS DIPSTICK
Spec Grav, UA: 1.015 (ref 1.010–1.025)
pH, UA: 6 (ref 5.0–8.0)

## 2021-09-06 NOTE — Progress Notes (Unsigned)
   Subjective:    Patient ID: Meghan Carter, female    DOB: 12-25-1980, 41 y.o.   MRN: [id number]  HPI  Recently had hysteroscopy procedure at [facility] since still experiencing pain, in pelvic area around to her back left a urine sample she wants tested, no period since procedure,patient concerned her symptoms dont seem normal,vision changes, tingling in hands and feet   Review of Systems     Objective:   Physical Exam        Assessment & Plan:

## 2021-09-06 NOTE — Progress Notes (Unsigned)
Recently had hysteroscopy procedure at [facility] since still experiencing pain, in pelvic area around to her back left a urine sample she wants tested, no period since procedure,patient concerned her symptoms dont seem normal,vision changes, tingling in hands and feet

## 2021-09-07 ENCOUNTER — Encounter: Payer: Self-pay | Admitting: Nurse Practitioner

## 2021-09-07 ENCOUNTER — Telehealth: Payer: Self-pay | Admitting: Nurse Practitioner

## 2021-09-07 NOTE — Telephone Encounter (Signed)
[facility], Trenton Gammon, NP     Returned patient's call. Encouraged her to keep appointment tomorrow. Also encouraged patient to follow up with post op appointment and then decided if she would like to continue with aquatic therapy.   Patient continues to experience minimal itching while using Hydroxyzine. Patient would like to continue with Hydroxyzine.   Barbee Shropshire

## 2021-09-07 NOTE — Telephone Encounter (Signed)
Patient is requesting to speak with you about her visit today with specialist..

## 2021-09-08 ENCOUNTER — Ambulatory Visit: Payer: BC Managed Care – PPO

## 2021-09-09 ENCOUNTER — Encounter: Payer: Self-pay | Admitting: Obstetrics and Gynecology

## 2021-09-09 ENCOUNTER — Ambulatory Visit (INDEPENDENT_AMBULATORY_CARE_PROVIDER_SITE_OTHER): Payer: Medicaid Other | Admitting: Obstetrics and Gynecology

## 2021-09-09 VITALS — BP 108/76 | HR 78 | Ht 63.0 in | Wt 227.0 lb

## 2021-09-09 DIAGNOSIS — R159 Full incontinence of feces: Secondary | ICD-10-CM | POA: Diagnosis not present

## 2021-09-09 DIAGNOSIS — M62838 Other muscle spasm: Secondary | ICD-10-CM

## 2021-09-09 DIAGNOSIS — N816 Rectocele: Secondary | ICD-10-CM

## 2021-09-09 DIAGNOSIS — K5904 Chronic idiopathic constipation: Secondary | ICD-10-CM | POA: Diagnosis not present

## 2021-09-09 DIAGNOSIS — R350 Frequency of micturition: Secondary | ICD-10-CM | POA: Diagnosis not present

## 2021-09-09 DIAGNOSIS — N393 Stress incontinence (female) (male): Secondary | ICD-10-CM

## 2021-09-09 DIAGNOSIS — N3281 Overactive bladder: Secondary | ICD-10-CM | POA: Diagnosis not present

## 2021-09-09 LAB — POCT URINALYSIS DIPSTICK
Bilirubin, UA: NEGATIVE
Blood, UA: NEGATIVE
Glucose, UA: NEGATIVE
Ketones, UA: NEGATIVE
Leukocytes, UA: NEGATIVE
Nitrite, UA: NEGATIVE
Protein, UA: NEGATIVE
Spec Grav, UA: 1.02 (ref 1.010–1.025)
Urobilinogen, UA: 0.2 E.U./dL
pH, UA: 7 (ref 5.0–8.0)

## 2021-09-09 MED ORDER — TROSPIUM CHLORIDE ER 60 MG PO CP24: 1.0000 | ORAL_CAPSULE | Freq: Every day | ORAL | 5 refills | Status: DC

## 2021-09-09 NOTE — Patient Instructions (Addendum)
Today we talked about ways to manage bladder urgency such as altering your diet to avoid irritative beverages and foods (bladder diet) as well as attempting to decrease stress and other exacerbating factors.     The Most Bothersome Foods* The Least Bothersome Foods*  Coffee - Regular & Decaf Tea - caffeinated Carbonated beverages - cola, non-colas, diet & caffeine-free Alcohols - Beer, Red Wine, White Wine, Champagne Fruits - Grapefruit, [place], Orange, [employer] - Cranberry, Grapefruit, Orange, Pineapple Vegetables - Tomato & Tomato Products Flavor Enhancers - Hot peppers, Spicy foods, Chili, Horseradish, Vinegar, Monosodium glutamate (MSG) Artificial Sweeteners - NutraSweet, Sweet 'N Low, Equal (sweetener), Saccharin Ethnic foods - [place], [place], [place] food [employer]s - low-fat & whole Fruits - Bananas, Blueberries, Honeydew melon, Pears, Raisins, Watermelon Vegetables - Broccoli, Brussels Sprouts, [place], Carrots, Cauliflower, [place], Cucumber, Mushrooms, Peas, Radishes, Squash, Zucchini, White potatoes, Sweet potatoes & yams Poultry - Chicken, Eggs, [place], [employer] - Beef, Programmer, multimedia, Lamb Seafood - Shrimp, [place] fish, Salmon Grains - Oat, Rice Snacks - Pretzels, Popcorn  *Lissa Morales et al. Diet and its role in interstitial cystitis/bladder pain syndrome (IC/BPS) and comorbid conditions. [place] 2012 Jan 11.   We discussed the symptoms of overactive bladder (OAB), which include urinary urgency, urinary frequency, night-time urination, with or without urge incontinence.  We discussed management including behavioral therapy (decreasing bladder irritants by following a bladder diet, urge suppression strategies, timed voids, bladder retraining), physical therapy, medication; and for refractory cases posterior tibial nerve stimulation, sacral neuromodulation, and intravesical botulinum toxin injection.   Prescribed Trospim '60mg'$  ER. For anticholinergic medications,  we discussed the potential side effects of anticholinergics including dry eyes, dry mouth, constipation, rare risks of cognitive impairment and urinary retention.  It can take a month to start working so give it time, but if you have bothersome side effects call sooner and we can try a different medication.  Call us if you have trouble filling the prescription or if it's not covered by your insurance.
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
## 2021-09-13 ENCOUNTER — Telehealth: Payer: Self-pay | Admitting: Nurse Practitioner

## 2021-09-13 ENCOUNTER — Telehealth: Payer: Self-pay | Admitting: Obstetrics and Gynecology

## 2021-09-13 DIAGNOSIS — K5904 Chronic idiopathic constipation: Secondary | ICD-10-CM

## 2021-09-13 NOTE — Telephone Encounter (Signed)
Pt requested referral to GI be changed to location in [place].

## 2021-09-13 NOTE — Telephone Encounter (Signed)
Patient had [employer] fax over these papers for review . . I looked through packet and some pages need to be filled out. In your blue folder in your office

## 2021-09-14 ENCOUNTER — Encounter: Payer: Self-pay | Admitting: *Deleted

## 2021-09-14 ENCOUNTER — Ambulatory Visit (HOSPITAL_COMMUNITY)
Admission: RE | Admit: 2021-09-14 | Discharge: 2021-09-14 | Disposition: A | Discharge status: Home / Self Care | Payer: Medicaid Other | Source: Ambulatory Visit | Attending: Nurse Practitioner | Admitting: Nurse Practitioner

## 2021-09-14 ENCOUNTER — Ambulatory Visit (INDEPENDENT_AMBULATORY_CARE_PROVIDER_SITE_OTHER): Payer: BC Managed Care – PPO | Admitting: Nurse Practitioner

## 2021-09-14 ENCOUNTER — Encounter: Payer: Self-pay | Admitting: Nurse Practitioner

## 2021-09-14 ENCOUNTER — Telehealth: Payer: Self-pay | Admitting: Nurse Practitioner

## 2021-09-14 VITALS — BP 98/69 | HR 113 | Ht 63.0 in | Wt 228.8 lb

## 2021-09-14 DIAGNOSIS — R200 Anesthesia of skin: Secondary | ICD-10-CM | POA: Diagnosis not present

## 2021-09-14 DIAGNOSIS — R1011 Right upper quadrant pain: Secondary | ICD-10-CM | POA: Insufficient documentation

## 2021-09-14 DIAGNOSIS — N809 Endometriosis, unspecified: Secondary | ICD-10-CM | POA: Diagnosis not present

## 2021-09-14 DIAGNOSIS — R202 Paresthesia of skin: Secondary | ICD-10-CM

## 2021-09-14 DIAGNOSIS — D219 Benign neoplasm of connective and other soft tissue, unspecified: Secondary | ICD-10-CM

## 2021-09-14 DIAGNOSIS — K760 Fatty (change of) liver, not elsewhere classified: Secondary | ICD-10-CM | POA: Diagnosis not present

## 2021-09-14 DIAGNOSIS — F419 Anxiety disorder, unspecified: Secondary | ICD-10-CM

## 2021-09-14 NOTE — Progress Notes (Signed)
Subjective:    Patient ID: Meghan Carter, female    DOB: 1980-10-22, 41 y.o.   MRN: [id number]  HPI  Patient here after calling clinic yesterday with complaints of right upper quadrant pain and being asked to schedule appointment for evaluation.    Patient states that right upper quadrant pain started Sunday evening after eating spaghetti.  Patient describes pain as a dull pain to her right upper quadrant area as well as her epigastric area.  Patient states that she has taken '800mg'$  of ibuprofen and Atarax which seemed to help pain.  Patient states that pain is exaggerated when laying on her right side which is causes her to lay on her left side.  Patient also admits to nausea, acid reflux, recent diarrhea but patient has also been taking milk of magnesia secondary to constipation,.  Patient describes pain to right upper quadrant pain as constant and dull however she describes pain to epigastric and esophagus as a sharp pain.  Patient denies any fevers, chills, body aches, blood in stool, vomiting, vomiting stool, mucus in stool.    States that she continues to have numbness and tingling however only to her right arm.  Patient states that the numbness and tingling to bilateral hands and bilateral feet have gotten better.  Patient denies any weakness.  Recent visit with urogynecology discussed with patient.  Patient still has concerns as to why her menstrual cycle has not returned.  Patient has concerns about upcoming postop visit next week and the next steps in her medical plan.  Patient continues to weigh options of Lupron versus hysterectomy.  Patient expressed her disappointment of possibly not being able to have children but at the same time realizing that she does not want to be in pain.  Patient concerned that if she goes through with a hysterectomy she may feel less like a woman.  Patient apprehensive about taking out organs that are "supposed to be there".  Patient also saddened by the fact  that if she has a hysterectomy she will definitely not be able to have children in the future.  Review of Systems  Gastrointestinal:  Positive for abdominal pain, constipation, diarrhea (Secondary to use of milk of magnesia for constipation) and nausea. Negative for vomiting.  All other systems reviewed and are negative.      Objective:   Physical Exam Vitals reviewed.  Constitutional:      General: She is not in acute distress.    Appearance: Normal appearance. She is normal weight. She is not ill-appearing, toxic-appearing or diaphoretic.  HENT:     Head: Normocephalic and atraumatic.  Cardiovascular:     Rate and Rhythm: Normal rate and regular rhythm.     Pulses: Normal pulses.     Heart sounds: Normal heart sounds. No murmur heard. Pulmonary:     Effort: Pulmonary effort is normal. No respiratory distress.     Breath sounds: Normal breath sounds. No wheezing.  Abdominal:     General: Abdomen is flat. Bowel sounds are normal. There is no distension.     Palpations: Abdomen is soft. There is no mass.     Tenderness: There is abdominal tenderness in the right upper quadrant, right lower quadrant, epigastric area and left lower quadrant. There is no guarding or rebound. Negative signs include Murphy's sign, Rovsing's sign, McBurney's sign and psoas sign.     Hernia: No hernia is present.  Musculoskeletal:        General: No deformity.  Comments: Grossly intact  Skin:    General: Skin is warm.     Capillary Refill: Capillary refill takes less than 2 seconds.  Neurological:     Mental Status: She is alert.     Comments: Grossly intact  Psychiatric:        Mood and Affect: Mood normal.        Behavior: Behavior normal.     Comments: Tearful at times        Assessment & Plan:   1. RUQ pain - Suspect gall bladder or pancreatic etiology vs GERD - US Abdomen Complete, STAT - Suggested to have labs done STAT at [facility], however, patient prefers labs to be done at [facility] so all her labs can be done with one stick. [facility] does not do non-emergent labs). Labs sent stat to [facility] - Amylase - CBC with Differential - Comprehensive metabolic panel - Lipase - B12 and Folate Panel - Will contact with results and next steps in plan of care  2. Numbness and tingling - Will evaluate vitamin deficiencies with B 12 and Folate - B12 and Folate Panel - If symptoms persist may consider MSK etiology - RTC to clinic is symptoms worsen or persist  3. Leiomyoma / Endometriosis - Suggest patient continue to work with GYN team to develop medical plan - Follow up next week after postop appointment  4. Anxiety - Patient has some anxiety about upcoming post-op appointment and next steps in medical care.  - Patient continues to use Hydroxyzine which she states does help calm her as well as help with the itching.  - Patient was seeing behavioral health, however, there appeared to be a concern regarding a bill. Patient currently unemployed which creates a barrier to continued services. - However, may consider another referral if patient is granted disability or sooner if needed as long as patient in agreement.    Note:  This document was prepared using Dragon voice recognition software and may include unintentional dictation errors. Note - This record has been created using [employer].  Chart creation errors have been sought, but may not always  have been located. Such creation errors do not reflect on  the standard of medical care.

## 2021-09-14 NOTE — Telephone Encounter (Signed)
Patient was seen this morning and was told to call back with what she ate or drank. She had a smoothie and coffee with cream

## 2021-09-15 ENCOUNTER — Encounter: Payer: Self-pay | Admitting: Nurse Practitioner

## 2021-09-15 ENCOUNTER — Other Ambulatory Visit: Payer: Self-pay | Admitting: Nurse Practitioner

## 2021-09-15 ENCOUNTER — Ambulatory Visit (HOSPITAL_COMMUNITY): Payer: BC Managed Care – PPO

## 2021-09-15 DIAGNOSIS — K859 Acute pancreatitis without necrosis or infection, unspecified: Secondary | ICD-10-CM

## 2021-09-15 DIAGNOSIS — B372 Candidiasis of skin and nail: Secondary | ICD-10-CM

## 2021-09-15 LAB — CBC WITH DIFFERENTIAL/PLATELET
Basophils Absolute: 0.1 10*3/uL (ref 0.0–0.2)
Basos: 1 %
EOS (ABSOLUTE): 0.6 10*3/uL — ABNORMAL HIGH (ref 0.0–0.4)
Eos: 11 %
Hematocrit: 39 % (ref 34.0–46.6)
Hemoglobin: 12.9 g/dL (ref 11.1–15.9)
Immature Grans (Abs): 0 10*3/uL (ref 0.0–0.1)
Immature Granulocytes: 0 %
Lymphocytes Absolute: 2.1 10*3/uL (ref 0.7–3.1)
Lymphs: 40 %
MCH: 26.6 pg (ref 26.6–33.0)
MCHC: 33.1 g/dL (ref 31.5–35.7)
MCV: 80 fL (ref 79–97)
Monocytes Absolute: 0.6 10*3/uL (ref 0.1–0.9)
Monocytes: 11 %
Neutrophils Absolute: 2 10*3/uL (ref 1.4–7.0)
Neutrophils: 37 %
Platelets: 319 10*3/uL (ref 150–450)
RBC: 4.85 x10E6/uL (ref 3.77–5.28)
RDW: 12.1 % (ref 11.7–15.4)
WBC: 5.2 10*3/uL (ref 3.4–10.8)

## 2021-09-15 LAB — LIPASE: Lipase: 35 U/L (ref 14–72)

## 2021-09-15 LAB — COMPREHENSIVE METABOLIC PANEL
ALT: 19 IU/L (ref 0–32)
AST: 19 IU/L (ref 0–40)
Albumin/Globulin Ratio: 1.6 (ref 1.2–2.2)
Albumin: 4.6 g/dL (ref 3.9–4.9)
Alkaline Phosphatase: 72 IU/L (ref 44–121)
BUN/Creatinine Ratio: 13 (ref 9–23)
BUN: 9 mg/dL (ref 6–24)
Bilirubin Total: 0.2 mg/dL (ref 0.0–1.2)
CO2: 21 mmol/L (ref 20–29)
Calcium: 9.7 mg/dL (ref 8.7–10.2)
Chloride: 101 mmol/L (ref 96–106)
Creatinine, Ser: 0.71 mg/dL (ref 0.57–1.00)
Globulin, Total: 2.9 g/dL (ref 1.5–4.5)
Glucose: 79 mg/dL (ref 70–99)
Potassium: 4.8 mmol/L (ref 3.5–5.2)
Sodium: 140 mmol/L (ref 134–144)
Total Protein: 7.5 g/dL (ref 6.0–8.5)
eGFR: 109 mL/min/{1.73_m2} (ref >59)

## 2021-09-15 LAB — B12 AND FOLATE PANEL
Folate: 9.7 ng/mL (ref >3.0)
Vitamin B-12: 574 pg/mL (ref 232–1245)

## 2021-09-15 LAB — AMYLASE: Amylase: 358 U/L — ABNORMAL HIGH (ref 31–110)

## 2021-09-15 MED ORDER — NYSTATIN 100000 UNIT/GM EX POWD: 1.0000 | Freq: Three times a day (TID) | CUTANEOUS | 0 refills | Status: AC

## 2021-09-16 ENCOUNTER — Other Ambulatory Visit: Payer: Self-pay | Admitting: Nurse Practitioner

## 2021-09-16 ENCOUNTER — Ambulatory Visit (HOSPITAL_COMMUNITY)
Admission: RE | Admit: 2021-09-16 | Discharge: 2021-09-16 | Disposition: A | Discharge status: Home / Self Care | Payer: Medicaid Other | Source: Ambulatory Visit | Attending: Nurse Practitioner | Admitting: Nurse Practitioner

## 2021-09-16 ENCOUNTER — Encounter: Payer: Self-pay | Admitting: Nurse Practitioner

## 2021-09-16 ENCOUNTER — Telehealth: Payer: Self-pay | Admitting: Nurse Practitioner

## 2021-09-16 DIAGNOSIS — R109 Unspecified abdominal pain: Secondary | ICD-10-CM | POA: Diagnosis not present

## 2021-09-16 DIAGNOSIS — K859 Acute pancreatitis without necrosis or infection, unspecified: Secondary | ICD-10-CM | POA: Diagnosis not present

## 2021-09-16 DIAGNOSIS — K219 Gastro-esophageal reflux disease without esophagitis: Secondary | ICD-10-CM

## 2021-09-16 MED ORDER — IOHEXOL 9 MG/ML PO SOLN
ORAL | Status: AC
  Filled 2021-09-16: qty 1000

## 2021-09-16 MED ORDER — FAMOTIDINE 20 MG PO TABS: 20.0000 mg | ORAL_TABLET | Freq: Every day | ORAL | 0 refills | Status: DC

## 2021-09-16 MED ORDER — IOHEXOL 300 MG/ML  SOLN
100.0000 mL | Freq: Once | INTRAMUSCULAR | Status: AC | PRN
  Administered 2021-09-16: 100 mL via INTRAVENOUS

## 2021-09-16 NOTE — Telephone Encounter (Signed)
[employer], Trenton Gammon, NP     Called patient at number provided.  Patient states that she was able to contact her mom was able to provide gas money for her to get to [employer].  Unfortunately abdominal CT scan at [facility] is already been canceled.  Explained to patient that we will call her first thing in the morning to get her abdominal CT scheduled for tomorrow.  Patient stated understanding.  Also discussed labs in detail.   Barbee Shropshire

## 2021-09-16 NOTE — Telephone Encounter (Signed)
Patient states still having really bad pain between  her breast plate/chest. She wanting to know what she can do until her CT scan. She cant lay on her back or stomach due to pain she can only lay on her left-side. She would like to know something before we leave today if possible. Please advise

## 2021-09-20 ENCOUNTER — Ambulatory Visit: Payer: BC Managed Care – PPO | Admitting: Nurse Practitioner

## 2021-09-20 ENCOUNTER — Other Ambulatory Visit: Payer: Self-pay

## 2021-09-20 DIAGNOSIS — K219 Gastro-esophageal reflux disease without esophagitis: Secondary | ICD-10-CM

## 2021-09-20 MED ORDER — FAMOTIDINE 20 MG PO TABS: 20.0000 mg | ORAL_TABLET | Freq: Every day | ORAL | 0 refills | Status: DC

## 2021-09-21 ENCOUNTER — Encounter: Payer: Self-pay | Admitting: Nurse Practitioner

## 2021-09-21 ENCOUNTER — Ambulatory Visit (INDEPENDENT_AMBULATORY_CARE_PROVIDER_SITE_OTHER): Payer: BC Managed Care – PPO | Admitting: Nurse Practitioner

## 2021-09-21 VITALS — BP 120/76 | HR 113 | Ht 63.0 in | Wt 225.6 lb

## 2021-09-21 DIAGNOSIS — K219 Gastro-esophageal reflux disease without esophagitis: Secondary | ICD-10-CM | POA: Diagnosis not present

## 2021-09-21 DIAGNOSIS — M545 Low back pain, unspecified: Secondary | ICD-10-CM

## 2021-09-21 DIAGNOSIS — D219 Benign neoplasm of connective and other soft tissue, unspecified: Secondary | ICD-10-CM | POA: Diagnosis not present

## 2021-09-21 DIAGNOSIS — G8929 Other chronic pain: Secondary | ICD-10-CM

## 2021-09-21 DIAGNOSIS — N809 Endometriosis, unspecified: Secondary | ICD-10-CM | POA: Diagnosis not present

## 2021-09-21 NOTE — Progress Notes (Signed)
   Subjective:    Patient ID: Meghan Carter, female    DOB: 04-01-1980, 41 y.o.   MRN: [id number]  HPI  Follow up to go over scan results and post op report  Review of Systems     Objective:   Physical Exam        Assessment & Plan:

## 2021-09-22 ENCOUNTER — Encounter: Payer: Self-pay | Admitting: Nurse Practitioner

## 2021-09-23 NOTE — Progress Notes (Signed)
09/23/21- my chart message sent to patient.

## 2021-09-26 ENCOUNTER — Ambulatory Visit: Payer: BC Managed Care – PPO | Admitting: Nurse Practitioner

## 2021-09-29 ENCOUNTER — Ambulatory Visit (INDEPENDENT_AMBULATORY_CARE_PROVIDER_SITE_OTHER): Payer: BC Managed Care – PPO | Admitting: Nurse Practitioner

## 2021-09-29 ENCOUNTER — Encounter: Payer: Self-pay | Admitting: Nurse Practitioner

## 2021-09-29 VITALS — BP 115/77 | HR 109 | Temp 97.2°F | Ht 63.0 in | Wt 229.0 lb

## 2021-09-29 DIAGNOSIS — N809 Endometriosis, unspecified: Secondary | ICD-10-CM

## 2021-09-29 DIAGNOSIS — M5441 Lumbago with sciatica, right side: Secondary | ICD-10-CM

## 2021-09-29 DIAGNOSIS — G8929 Other chronic pain: Secondary | ICD-10-CM

## 2021-09-29 DIAGNOSIS — K5900 Constipation, unspecified: Secondary | ICD-10-CM

## 2021-09-29 DIAGNOSIS — K219 Gastro-esophageal reflux disease without esophagitis: Secondary | ICD-10-CM

## 2021-09-29 DIAGNOSIS — D219 Benign neoplasm of connective and other soft tissue, unspecified: Secondary | ICD-10-CM | POA: Diagnosis not present

## 2021-09-29 DIAGNOSIS — R1030 Lower abdominal pain, unspecified: Secondary | ICD-10-CM

## 2021-09-29 NOTE — Progress Notes (Signed)
Discomfort in sternum area, follow up from preop, concerned pepcid maybe making her itch but they are helping,heavy feeling in abdomin

## 2021-09-30 ENCOUNTER — Encounter: Payer: Self-pay | Admitting: Nurse Practitioner

## 2021-09-30 NOTE — Progress Notes (Signed)
Subjective:    Patient ID: Meghan Carter, female    DOB: 10-17-1980, 41 y.o.   MRN: [id number]  HPI  Patient presents to clinic today for follow-up visit with her GYN.  Patient states that she was prescribed vaginal estrogen and norethindrone to help with endometriosis and fibroids/adenomyosis symptoms.  Patient also states that GYN plans to do a laparoscopic be in 3 to 4 months to evaluate for and possibly remove endometriosis.   Patient states that Pepcid has helped her right upper quadrant and epigastric pain however she believes that she is mildly allergic to it as she does itch.  However patient states that hydroxyzine has been helping with the itching.  At this time, patient states that she will continue to take the Pepcid as she does not want to experience the pain to her upper right quadrant or to her epigastric area again.  Patient also states that she has been contacted by Ortho to be scheduled regarding her chronic back pain.  Patient states that she continues to feel a heavy feeling in her abdomen that is relieved after having a stool.  Patient also states that she continues to deal with constipation however plans to use magnesium citrate pill to help relieve constipation.  Patient states that she is tried over-the-counter Colace and MiraLAX however they caused her to itch.  Patient states she may consider them in the future now that she has hydroxyzine.  Overall patient states that she is doing a lot better.    Review of Systems  Musculoskeletal:  Positive for back pain.  All other systems reviewed and are negative.      Objective:   Physical Exam Vitals reviewed.  Constitutional:      General: She is not in acute distress.    Appearance: Normal appearance. She is normal weight. She is not ill-appearing, toxic-appearing or diaphoretic.  HENT:     Head: Normocephalic and atraumatic.  Musculoskeletal:     Comments: Grossly intact  Neurological:     Mental Status: She  is alert.     Comments: Grossly intact  Psychiatric:        Mood and Affect: Mood normal.        Behavior: Behavior normal.         Assessment & Plan:   1. Endometriosis / Leiomyoma -Patient to continue to follow-up with GYN for management of endometriosis/fibrosis/adenomyosis. -We will plan to discuss last GYN note with patient once I receive the notes from her provider. -Return to clinic as scheduled in September  2. Gastroesophageal reflux disease, unspecified whether esophagitis present -Continue to take Pepcid as tolerated since they are helping with patient's symptoms. -May continue to use hydroxyzine so that she may tolerate it. -Continue modified diet and omit foods that irritate your stomach such as tomatoes, coffee use, acidic foods, chocolates. -Return to clinic in September as scheduled  3. Chronic midline low back pain with right-sided sciatica -Follow-up with orthopedics as scheduled for specialist evaluation of chronic low back pain. -Awaiting recommendations from specialist.  4. Lower abdominal pain / Constipation, unspecified constipation type -Suspect patient has constipation which is causing her lower abdominal pain or abdominal heaviness. -Continue to drink water and stay hydrated as much as possible -May use over-the-counter magnesium citrate pills -May also use MiraLAX and/or Colace to keep stools regular -Continue to try and eat a diet rich in fiber    Note:  This document was prepared using Dragon voice recognition software and may include  unintentional dictation errors. Note - This record has been created using [employer].  Chart creation errors have been sought, but may not always  have been located. Such creation errors do not reflect on  the standard of medical care.

## 2021-10-03 ENCOUNTER — Encounter: Payer: Self-pay | Admitting: Nurse Practitioner

## 2021-10-03 ENCOUNTER — Ambulatory Visit (HOSPITAL_COMMUNITY)
Admission: RE | Admit: 2021-10-03 | Discharge: 2021-10-03 | Disposition: A | Discharge status: Home / Self Care | Payer: Medicaid Other | Source: Ambulatory Visit | Attending: Nurse Practitioner | Admitting: Nurse Practitioner

## 2021-10-03 ENCOUNTER — Telehealth: Payer: Self-pay

## 2021-10-03 DIAGNOSIS — G8929 Other chronic pain: Secondary | ICD-10-CM | POA: Insufficient documentation

## 2021-10-03 DIAGNOSIS — M549 Dorsalgia, unspecified: Secondary | ICD-10-CM | POA: Diagnosis not present

## 2021-10-03 DIAGNOSIS — M545 Low back pain, unspecified: Secondary | ICD-10-CM | POA: Diagnosis not present

## 2021-10-03 DIAGNOSIS — M542 Cervicalgia: Secondary | ICD-10-CM | POA: Diagnosis not present

## 2021-10-03 NOTE — Telephone Encounter (Signed)
[employer], Trenton Gammon, NP     Called patient at number of file.   Patient states that numbness and tingling got worse after functional capacity exam and that she starting experiencing increased weakness after functional capacity exam. Patient has appointment with Ortho on 09/26/2021.   Patient got her Xrays this morning. Will await to see reading of Xrays. If further imaging warranted will get it at that time. Will likely also have nursing staff call patient to schedule appointment with me this week after I read the Xrays.   Thanks   [place]

## 2021-10-03 NOTE — Telephone Encounter (Signed)
Contacted patient and informed her may take OTC tylenol for pain as needed , follow up with orthopedic on Thursday.

## 2021-10-03 NOTE — Progress Notes (Unsigned)
GI Office Note    Referring Provider: Jaquita Folds, * Primary Care Physician:  Claire Shown, NP  Primary Gastroenterologist: Dr. Abbey Chatters  Chief Complaint   Chief Complaint  Patient presents with   Constipation    Referred for constipation. Has lower abdominal pain. Has seen some blood in stool in the past but none in past couple of months. Tried milk of mag, coffee. Always has a full feeling when eating. Has stopped eating beef due it causing pain on right side. Starting taking pepcid bid instead of once daily.     History of Present Illness   Meghan Carter is a 41 y.o. female presenting today at the request of Jaquita Folds, * for constipation.  Last colonoscopy in February 2016.  Redundant colon reported, main abdominal counterpressure use to reach the cecum, moderate size internal hemorrhoids.  She had repeat flex sigmoidoscopy was in February 2017 for hemorrhoid banding.  Last in the office in October 2019 for rectal bleeding and abnormal uterine and vaginal bleeding.  She reported feeling social fluids, through her rectum when she was on her cycle.  Also reported she was having rectal bleeding after every bowel movement, reporting heaviness like clots.  Reported she was taking Metamucil pills every day.  Reported rare nausea.  Reported abdominal pain from surgery/endometriosis.  Reported reflux about 1-2 times per month.  It was noted that she had had hemorrhoid banding in 2016/2017.  She is advised to complete CT pelvis with rectal contrast to evaluate for fistula advised to follow-up with surgery to fix hemorrhoids.  She is also advised to use hemorrhoid cream 4 times a day for 2 weeks.  CT pelvis in 2019 with rectal contrast revealed no evidence of rectovaginal fistula.  Patient was advised to follow-up with surgery regarding hemorrhoids.  Last office visit with PCP 09/29/2021.  Patient reported lower abdominal pain, suspected constipation as cause of her  pain.  She was advised to drink water, and that she may take over-the-counter magnesium citrate pills, MiraLAX, and/or Colace to keep stools regular.  She is also advised to try high-fiber diet.  Patient reports she continues take Pepcid as tolerated which is helping with her reflux.  She was advised on dietary modifications as well.   Today:  Constipation: Having a bowel movement about every other day. Sometimes every 2-3 days. Some are thick and hard but mostly thin and hard. Not described as little balls. Sometimes long harder stools. Does have to strain a lot. Used to use a Physiological scientist. Having a lot of lower abdominal pain and some back pain. Passes gas and she has some relief from time to time. Feels bloated and full most days to where she feels like she can not close her legs in. Will take milk of magnesia to help with bowel movements but saves that as a last resort. Has not taken any fiber supplements. Does eat oatmeal frequently and special K cereal and fat free milk. Has tried miralax and colace in the past. Had an allergic reaction to colace. Was taking miralax everyday 1 capful daily. Has seen some blood 2-3 times this year but last episode was 2 months ago. Every time was with constipation and straining and resolved.   Recently had a hysteroscopy to evaluate for myosis, fibroids, endometriosis.   GERD: Taking pepcid twice daily now. Feels chest heaviness at times. Denies frequent burping or sour tastes. Was having RUQ prior to this. Now only feel that pain  if she eats beef or its too much.     Current Outpatient Medications  Medication Sig Dispense Refill   famotidine (PEPCID) 20 MG tablet Take 1 tablet (20 mg total) by mouth at bedtime. 90 tablet 0   hydrOXYzine (VISTARIL) 25 MG capsule Take 1 capsule (25 mg total) by mouth every 8 (eight) hours as needed. 90 capsule 1   nystatin (MYCOSTATIN/NYSTOP) powder Apply 1 Application topically 3 (three) times daily. 15 g 0   PRESCRIPTION  MEDICATION norethindrone     Trospium Chloride 60 MG CP24 Take 1 capsule (60 mg total) by mouth daily. 30 capsule 5   No current facility-administered medications for this visit.    Past Medical History:  Diagnosis Date   Acute blood loss anemia 03/26/2015   Anemia    Anxiety    Atherosclerosis of arteries    Blood transfusion without reported diagnosis 2017   2 units, no reaction   Callus of foot 02/08/2011   Depression    Dysmenorrhea    Endometrial polyp 02/11/2014   Fecal occult blood test positive 01/27/2014   Fecal occult blood test positive 01/27/2014   Fibroid    High-tone pelvic floor dysfunction 03/17/2015   History of abnormal cervical Pap smear 02/11/2014   History of acute pancreatitis 03/31/2011   HSV (herpes simplex virus) infection    Hydrosalpinx    Bilateral   Internal hemorrhoids    Lichenification and lichen simplex chronicus 11/07/2010   Lower abdominal pain    Menorrhagia with regular cycle 01/27/2014   Menorrhagia with regular cycle 01/27/2014   Other fatigue    Pelvic pain in female 01/27/2014   Plantar fasciitis 02/08/2011   Rectal bleeding 01/27/2014   Rectal pain 03/19/2014   Severe anemia    Vaginal Pap smear, abnormal     Past Surgical History:  Procedure Laterality Date   CHROMOPERTUBATION  08/14/2017   Procedure: CHROMOPERTUBATION;  Surgeon: Governor Specking, MD;  Location: [facility];  Service: Gynecology;;   COLONOSCOPY N/A 03/23/2014   SLF: 1. The examined terminal ileum appeared to be normal. 2. The left colon is redundant 3. Moderate sized internal hemorrhoids   ECTOPIC PREGNANCY SURGERY Left 2005   FLEXIBLE SIGMOIDOSCOPY N/A 04/01/2015   SLF: rectal bleeding due to large internal hemorrhoids 2. anemia due to rectal bleeding/memses/ poor iron intake.   FLEXIBLE SIGMOIDOSCOPY N/A 03/27/2015   Procedure: FLEXIBLE SIGMOIDOSCOPY;  Surgeon: Danie Binder, MD;  [facility];  Service: Endoscopy;  Laterality: N/A;   HEMORRHOID BANDING N/A  03/23/2014   Procedure: HEMORRHOID BANDING;  Surgeon: Danie Binder, MD;  [facility];  Service: Endoscopy;  Laterality: N/A;   HEMORRHOID BANDING  03/27/2015   Procedure: HEMORRHOID BANDING;  Surgeon: Danie Binder, MD;  [facility];  Service: Endoscopy;;   HEMORRHOID SURGERY N/A 01/11/2018   Procedure: EXTENSIVE HEMORRHOIDECTOMY;  Surgeon: Aviva Signs, MD;  Location: [facility];  Service: General;  Laterality: N/A;   HYSTEROSCOPY WITH D & C N/A 06/16/2014   Procedure: DILATATION AND CURETTAGE /HYSTEROSCOPY;  Surgeon: Jonnie Kind, MD;  Location: [facility];  Service: Gynecology;  Laterality: N/A;   LAPAROSCOPIC UNILATERAL SALPINGECTOMY Right 06/16/2014   Procedure: LAPAROSCOPIC UNILATERAL SALPINGECTOMY;  Surgeon: Jonnie Kind, MD;  Location: [facility];  Service: Gynecology;  Laterality: Right;   LAPAROSCOPY N/A 08/14/2017   Procedure: OPERATIVE LAPAROSCOPY ,LEFT SALPINGECTOMY, LYSIS OF ADHESIONS;  Surgeon: Governor Specking, MD;  [facility];  Service: Gynecology;  Laterality: N/A;  POLYPECTOMY N/A 06/16/2014   Procedure: POLYPECTOMY (endometrial);  Surgeon: Jonnie Kind, MD;  [facility];  Service: Gynecology;  Laterality: N/A;    Family History  Problem Relation Age of Onset   Diabetes Father    Diabetes Paternal Grandmother    Diabetes Paternal Grandfather     Allergies as of 10/04/2021 - Review Complete 10/04/2021  Allergen Reaction Noted   Clindamycin Itching 01/14/2019   Clindamycin/lincomycin Itching 01/14/2019   Amoxil [amoxicillin]  04/30/2020   Prednisone  06/29/2020   Cymbalta [duloxetine hcl] Rash 06/09/2021   Doxycycline hyclate Rash 12/27/2017   Doxycycline hyclate Rash 12/27/2017   Gabapentin Rash 06/09/2021   Meloxicam Rash 07/06/2020   Tramadol Rash 04/05/2021    Social History   Socioeconomic History   Marital status: Divorced    Spouse name: Not on file   Number of children: Not on file   Years of education: Not on file    Highest education level: Not on file  Occupational History   Not on file  Tobacco Use   Smoking status: Light Smoker    Packs/day: 0.25    Years: 27.00    Total pack years: 6.75    Types: Cigarettes    Passive exposure: Current   Smokeless tobacco: Never  Vaping Use   Vaping Use: Never used  Substance and Sexual Activity   Alcohol use: Not Currently   Drug use: No   Sexual activity: Not Currently    Partners: Male    Birth control/protection: None  Other Topics Concern   Not on file  Social History Narrative   Not on file   Social Determinants of Health   Financial Resource Strain: Not on file  Food Insecurity: Not on file  Transportation Needs: Not on file  Physical Activity: Not on file  Stress: Not on file  Social Connections: Not on file  Intimate Partner Violence: Not on file     Review of Systems   Gen: Denies any fever, chills, fatigue, weight loss, lack of appetite.  CV: Denies chest pain, heart palpitations, peripheral edema, syncope.  Resp: Denies shortness of breath at rest or with exertion. Denies wheezing or cough.  GI: see HPI GU : Denies urinary burning, urinary frequency, urinary hesitancy MS: Denies joint pain, muscle weakness, cramps, or limitation of movement.  Derm: Denies rash, itching, dry skin Psych: Denies depression, anxiety, memory loss, and confusion Heme: Denies bruising, bleeding, and enlarged lymph nodes.   Physical Exam   BP 90/64 (BP Location: Left Arm, Patient Position: Sitting, Cuff Size: Large)   Pulse 84   Temp (!) 97.2 F (36.2 C)   Ht '5\' 3"'$  (1.6 m)   Wt 229 lb 3.2 oz (104 kg)   LMP 09/23/2021   BMI 40.60 kg/m   General:   Alert and oriented. Pleasant and cooperative. Well-nourished and well-developed.  Head:  Normocephalic and atraumatic. Eyes:  Without icterus, sclera clear and conjunctiva pink.  Ears:  Normal auditory acuity. Mouth:  No deformity or lesions, oral mucosa pink.  Lungs:  Clear to auscultation  bilaterally. No wheezes, rales, or rhonchi. No distress.  Heart:  S1, S2 present without murmurs appreciated.  Abdomen:  +BS, soft, nondistended, generalized abdominal tenderness.  No HSM noted. No guarding or rebound. No masses appreciated.  Rectal:  Deferred  Msk:  Symmetrical without gross deformities. Normal posture. Extremities:  Without edema. Neurologic:  Alert and  oriented x4;  grossly normal neurologically. Skin:  Intact without significant lesions or rashes.  Psych:  Alert and cooperative. Normal mood and affect.   Assessment   Meghan Carter is a 41 y.o. female with a history of anemia, depression, dysmenorrhea, constipation, endometriosis, HSV, rectal bleeding likely due to internal hemorrhoids presenting today with ongoing constipation.  Constipation: Patient reports she usually has a bowel movement about once every other day, but sometimes goes more than 2 to 3 days without a bowel movement.  Stools vary from a [place] 1 to a [place] 3 but stools are always firm/hard.  She admits to frequent straining.  She reported she used to use a squatty potty but lately given her recent hysteroscopy she has not been able to use it.  She does occasionally pass gas and does have fullness feeling/bloating.  She denies taking any fiber supplements but reports she does eat oatmeal and special K cereal and recently switched to drinking fat-free milk.  She reports has tried MiraLAX and Colace in the past but has had an allergic reaction to what she feels like is both.  She was advised to take magnesium citrate pills by her PCP but reports it is too expensive.  She reports she is currently awaiting long-term disability and does not have much income and magnesium citrate was not covered by her insurance.  We discussed addition of fiber to her diet, provided handout on high-fiber foods.  We discussed multiple options for treatment of her constipation, for now she has settled on trying Senokot 1-2 tablets  daily.  I advised her insurance may not cover this either but it should be a cheaper option than magnesium citrate.  I also advised ongoing exercise and drinking at least 2-3 bottles of water daily.  For now she has been managing with milk of magnesia to help with bowel movements but uses that as a last resort.  I offered Linzess, Amitiza, or Trulance as options to treat her constipation and her associated abdominal pain but this is likely not a viable option for her given her current work/income status.  I offered her samples today, she may reconsider in the near future.  Last colonoscopy in 2016 with fleck sigmoidoscopy in 2017 where she underwent hemorrhoid banding.  No family history of colon cancer, has had a couple episodes of BRBPR related to straining.  Suspect she may have had return of hemorrhoids.  Some of her symptoms may be currently exacerbated by her recent hysteroscopy and her issues with fibroids and endometriosis.  GERD: Fairly well controlled currently on twice daily famotidine.  PLAN    Continue famotidine twice daily. Senokot 1 to 2 tablets daily.  If that she may increase to twice daily for total of 4 tablets daily if needed. May continue milk of magnesia as needed. Avoid straining Follow up in 2-3 months.    Venetia Night, MSN, FNP-BC, AGACNP-BC [facility]

## 2021-10-04 ENCOUNTER — Encounter: Payer: Self-pay | Admitting: Gastroenterology

## 2021-10-04 ENCOUNTER — Ambulatory Visit (INDEPENDENT_AMBULATORY_CARE_PROVIDER_SITE_OTHER): Payer: BC Managed Care – PPO | Admitting: Gastroenterology

## 2021-10-04 VITALS — BP 90/64 | HR 84 | Temp 97.2°F | Ht 63.0 in | Wt 229.2 lb

## 2021-10-04 DIAGNOSIS — K219 Gastro-esophageal reflux disease without esophagitis: Secondary | ICD-10-CM

## 2021-10-04 DIAGNOSIS — K5904 Chronic idiopathic constipation: Secondary | ICD-10-CM

## 2021-10-04 MED ORDER — SENNOSIDES 8.6 MG PO TABS: 1.0000 | ORAL_TABLET | Freq: Every day | ORAL | 1 refills | Status: DC

## 2021-10-04 NOTE — Patient Instructions (Signed)
I have sent in Senokot for you to take 1 to 2 tablets daily to help with your constipation.  Continue exercise as able.  Ensure you are drinking at least 2-3 bottles of water daily, this helps with constipation.  I have attached a handout regarding higher fiber foods.  You can also supplement with Benefiber, any generic is fine.  Typically start with 2 to 3 teaspoons daily, you may increase to twice daily after 1 to 2 weeks.  If you change your mind on trying prescriptions, I am happy to give you samples of Linzess.  Contact our office if you decide you want to try it.  Continue taking your famotidine twice daily to help with reflux symptoms.  You could also try over-the-counter omeprazole, Nexium, or Prevacid and follow directions on the box at the drugstore as well.  We will have you follow-up in 2 to 3 months.  Please call me with a progress report in the next week to 2.  It was a pleasure to see you today. I want to create trusting relationships with patients. If you receive a survey regarding your visit,  I greatly appreciate you taking time to fill this out on paper or through your MyChart. I value your feedback.  Venetia Night, MSN, FNP-BC, AGACNP-BC [facility]

## 2021-10-05 ENCOUNTER — Telehealth: Payer: Self-pay | Admitting: *Deleted

## 2021-10-05 NOTE — Telephone Encounter (Signed)
Pt called and stated that she was told that prescription for Senna plus docusate would be sent to the pharmacy not just plain senna. Please send a new prescription to pharmacy.

## 2021-10-06 ENCOUNTER — Ambulatory Visit (INDEPENDENT_AMBULATORY_CARE_PROVIDER_SITE_OTHER): Payer: BC Managed Care – PPO | Admitting: Orthopedic Surgery

## 2021-10-06 ENCOUNTER — Encounter: Payer: Self-pay | Admitting: Orthopedic Surgery

## 2021-10-06 ENCOUNTER — Encounter: Payer: Self-pay | Admitting: Nurse Practitioner

## 2021-10-06 VITALS — BP 109/70 | HR 106 | Ht 63.0 in | Wt 228.0 lb

## 2021-10-06 DIAGNOSIS — G8929 Other chronic pain: Secondary | ICD-10-CM

## 2021-10-06 DIAGNOSIS — M5137 Other intervertebral disc degeneration, lumbosacral region: Secondary | ICD-10-CM

## 2021-10-06 DIAGNOSIS — M5030 Other cervical disc degeneration, unspecified cervical region: Secondary | ICD-10-CM

## 2021-10-06 DIAGNOSIS — M545 Low back pain, unspecified: Secondary | ICD-10-CM
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
## 2021-10-06 NOTE — Telephone Encounter (Signed)
Noted  

## 2021-10-14 ENCOUNTER — Telehealth: Payer: Self-pay | Admitting: Orthopedic Surgery

## 2021-10-14 ENCOUNTER — Other Ambulatory Visit: Payer: Self-pay | Admitting: Nurse Practitioner

## 2021-10-14 DIAGNOSIS — K219 Gastro-esophageal reflux disease without esophagitis: Secondary | ICD-10-CM

## 2021-10-14 DIAGNOSIS — M545 Low back pain, unspecified: Secondary | ICD-10-CM

## 2021-10-14 DIAGNOSIS — M5137 Other intervertebral disc degeneration, lumbosacral region: Secondary | ICD-10-CM

## 2021-10-14 DIAGNOSIS — M5030 Other cervical disc degeneration, unspecified cervical region: Secondary | ICD-10-CM

## 2021-10-14 NOTE — Telephone Encounter (Signed)
Message for nurse Leta - returning call - requests, per Leta, to transfer to her line. Done as patient relayed.

## 2021-10-14 NOTE — Telephone Encounter (Signed)
After reaching out to [facility], I called pt and let her know that no one currently provides aquatic therapy in [place]. Gave pt the option of therapy in [place] or [facility]. Pt has decided that she wants to go back to [facility] at this time and order placed.

## 2021-10-17 ENCOUNTER — Telehealth: Payer: Self-pay | Admitting: Radiology

## 2021-10-17 NOTE — Telephone Encounter (Signed)
Noted, thanks!

## 2021-10-17 NOTE — Telephone Encounter (Signed)
-----   Message from Elizabeth Sauer, LPN sent at 08/14/9379 11:56 AM EDT ----- Regarding: Aquatic Therapy Abigail Butts,   I just wanted to give you a heads up about this pt. Dr. Aline Brochure suggested that she go back to aquatic therapy, pt wanted to get therapy done her in [place] at the [facility]. I've called to [facility] and [facility], but they are no longer employed there. I also called the [facility] and was told that the only program they had was provided by [facility] and they do not have anyone who actually provided those services. I've recommended that the pt go to [facility], where she's received therapy before and get a plan so that she could [facility] the local [facility]. Pt was also informed that she would have to pay for her own membership because we don't provide that. This patient has been a little difficult to talk to because it seems a lot of the information given to her is received differently than explained.

## 2021-10-19 ENCOUNTER — Telehealth (INDEPENDENT_AMBULATORY_CARE_PROVIDER_SITE_OTHER): Payer: BC Managed Care – PPO | Admitting: Nurse Practitioner

## 2021-10-19 ENCOUNTER — Encounter: Payer: Self-pay | Admitting: Nurse Practitioner

## 2021-10-19 ENCOUNTER — Ambulatory Visit: Payer: BC Managed Care – PPO | Admitting: Nurse Practitioner

## 2021-10-19 ENCOUNTER — Telehealth: Payer: Self-pay

## 2021-10-19 DIAGNOSIS — K5900 Constipation, unspecified: Secondary | ICD-10-CM

## 2021-10-19 DIAGNOSIS — K449 Diaphragmatic hernia without obstruction or gangrene: Secondary | ICD-10-CM

## 2021-10-19 DIAGNOSIS — K219 Gastro-esophageal reflux disease without esophagitis: Secondary | ICD-10-CM

## 2021-10-19 DIAGNOSIS — R102 Pelvic and perineal pain: Secondary | ICD-10-CM

## 2021-10-19 DIAGNOSIS — F419 Anxiety disorder, unspecified: Secondary | ICD-10-CM

## 2021-10-19 DIAGNOSIS — R519 Headache, unspecified: Secondary | ICD-10-CM

## 2021-10-19 NOTE — Progress Notes (Signed)
Subjective:    Patient ID: Meghan Carter, female    DOB: 1980/05/24, 41 y.o.   MRN: [id number]  HPI  Virtual Visit via Video Note  I connected with Meghan Carter on 10/19/21 at  2:10 PM EDT by a video enabled telemedicine application and verified that I am speaking with the correct person using two identifiers.  Location: Patient: home Provider: home   I discussed the limitations of evaluation and management by telemedicine and the availability of in person appointments. The patient expressed understanding and agreed to proceed.  History of Present Illness:  Patient states that she has started care with [facility] which required her to keep the appointment today.  Patient states that she has lost her insurance due to being out of work and therefore is connected with [facility] to receive care.  Patient states that she was offered an in network primary care provider but states that she will prefer to stay with [facility] for her primary care needs.  Patient also mentions that before she can come in for an appointment she needs to get approval from [facility] health financial services.  Patient has several concerns today:  Pelvic Pain Patient states that she continues to have pelvic pain.  Patient states that pain to her pelvic feels like menstrual cycle cramping and is very achy.  Patient concerned that norethindrone is making her pains worse.   Patient states that GYN is recommending a laparoscopic procedure around November or December with likely hysterectomy in the future.  Patient has been taking Tylenol for pain which helps sometimes.  Patient states she does not want to take ibuprofen due to having acid reflux and stomach pains.   Constipation Patient states that she was prescribed senna by the gastroenterologist but states that senna is causing her to have a rash.  Patient states that she has whelps on bilateral legs caused by the senna.  Patient states she no  longer wants to take senna.   Epigastric pain Patient states that epigastric pain has returned.  Patient states she is no longer taking her Pepcid.  Patient states that she feels a constant dull pain under her breasts, across her breast and down the middle.  Patient states it is worse when she bends down and when she moves her arms.  Patient states she is concerned about her hiatal hernia and would like a referral to gastro.  Headache Patient also complains of a intermittent headache.  Patient states that she believes that the norethindrone causing her headaches.  Discussed that birth control can sometimes cause headaches.  Patient denies any changes to vision, weakness, slurred speech or difficulty speaking.  Patient states that orthopedic specialist recommended a pain psychiatrist to help manage patient's pain.  Patient states that she would like to do that but unsure of how that would work now that she is with [facility].  Patient also states that she was seeing a counselor but states she can no longer see the counselor now that she does not have medical insurance.  Patient states that she feels she is taking too much medication at once and does not know which one is causing her to have symptoms and side effects.  Patient not sure if benefits of medication are worth the side effects.    Observations/Objective: Per video chat patient did not appear to be in any acute distress.  Patient emotional at times and tearful at times.  Patient able to speak in complete  sentences did not appear to be in any respiratory distress.  Assessment and Plan: 1. Pelvic pain -Encourage patient to take small amount of ibuprofen once a day to help with pain.  Patient states she rather not due to history of acid reflux and abdominal pain. -Patient to continue taking Tylenol -Continue to follow-up with GYN -We will contact [facility] to discuss how to move forward with medical appointments as  scheduled -Follow-up in 1 to 2 weeks in person  2. Constipation, unspecified constipation type -Patient prefers to stop taking senna due to rash -May trial senna again with Atarax to prevent rash and itching -May trial Colace and MiraLAX with Atarax to prevent rash and itching -Follow-up in 1 to 2 weeks in person  3. Hiatal hernia with GERD -Encourage patient to start taking Pepcid again with Atarax to ensure that it does not cause rash and itching -Hiatal hernia on exam noted to be "minimal".  -We will also refer patient to gastroenterology for evaluation of hiatal hernia - Ambulatory referral to Gastroenterology -Follow-up in 1 to 2 weeks in person  4. Acute nonintractable headache, unspecified headache type -Patient to continue to take Tylenol for headache management -If headache becomes worse return to clinic to be seen or go to emergency room -Follow-up in 1 to 2 weeks in person  5. Anxiety -Suspect that pain that patient is experiencing is exacerbated by anxiety -Patient no longer able to see counselor due to change in insurance -Will contact [facility] to determine how psychiatric referrals work now that patient is not on [facility]. -Follow-up in person 1 to 2 weeks  6. Disability forms -Discussed disability forms in detail. -Informed patient that I have attempted to call lawyer to 3 times.  Patient states that she now has a new lawyer within the same firm and will provide me with the appropriate number. -Follow-up in clinic 1 to 2 weeks  Follow Up Instructions:    I discussed the assessment and treatment plan with the patient. The patient was provided an opportunity to ask questions and all were answered. The patient agreed with the plan and demonstrated an understanding of the instructions.   The patient was advised to call back or seek an in-person evaluation if the symptoms worsen or if the condition fails to improve as anticipated.  I provided 45 minutes of  non-face-to-face time during this encounter.    Note:  This document was prepared using Dragon voice recognition software and may include unintentional dictation errors. Note - This record has been created using [employer].  Chart creation errors have been sought, but may not always  have been located. Such creation errors do not reflect on  the standard of medical care.

## 2021-10-19 NOTE — Telephone Encounter (Signed)
I connected with  Meghan Carter on 10/19/21 by a video enabled telemedicine application and verified that I am speaking with the correct person using two identifiers.   I discussed the limitations of evaluation and management by telemedicine. The patient expressed understanding and agreed to proceed.

## 2021-10-20 ENCOUNTER — Encounter: Payer: Self-pay | Admitting: Gastroenterology

## 2021-10-20 ENCOUNTER — Other Ambulatory Visit: Payer: Self-pay | Admitting: Gastroenterology

## 2021-10-21 ENCOUNTER — Telehealth: Payer: Self-pay

## 2021-10-21 NOTE — Telephone Encounter (Signed)
Caller name:[name]   On DPR? :Yes  Call back [place]  Provider they see: Barbee Shropshire   Reason for call:Meghan Carter called and was wanting to know if you found out if she still keeps that appt with [facility] on Monday she said that you was checking on this for her. Also she wants to know if you have talked to her attorney yet?

## 2021-10-21 NOTE — Telephone Encounter (Signed)
Please advise. Thank you

## 2021-10-24 ENCOUNTER — Ambulatory Visit: Payer: BC Managed Care – PPO | Admitting: Nurse Practitioner

## 2021-10-25 ENCOUNTER — Encounter: Payer: Self-pay | Admitting: Nurse Practitioner

## 2021-10-25 ENCOUNTER — Other Ambulatory Visit: Payer: Self-pay | Admitting: Nurse Practitioner

## 2021-10-25 DIAGNOSIS — R1011 Right upper quadrant pain: Secondary | ICD-10-CM

## 2021-10-25 NOTE — Telephone Encounter (Signed)
Caller name:Kassondra Marcello Moores   On DPR? :Yes  Call back [place]  Provider they see: Barbee Shropshire   Reason for call: Chandel is calling back she not got a call back about if you have talked to her  Attorney and [facility] also she has called saying there is no notes from Thursday appt yet as well. She needs to see the pain specialist. You are having pain in the breast and low abdomen she is wanting a call back ASAP

## 2021-10-26 ENCOUNTER — Encounter: Payer: Self-pay | Admitting: Nurse Practitioner

## 2021-10-26 NOTE — Telephone Encounter (Signed)
[facility], Trenton Gammon, NP     Called patient back. Patient expressed being frustrated about the amount of time it took to get back with her regarding her disability forms and [facility].   Informed patient that that I was able to speak with [facility] representative, Diane. Diane stated that [facility] is for adults without out health insurance and that they have 3 in network primary care providers. Diane, states that patient can see a PCP out of network with [facility] but a bill has to be created first and then [facility] will discount the medical bill. Information was relayed to patient. Patient states that she is hesitant to move forward with any more medical appointments because she has not heard from [facility] and does not know how much they will cover. Patient states that she cannot afford pay for anything since she is not working.   Informed patient that I called her disability lawyer but that we were playing phone tag. Awaiting for the lawyer to call back.   Patient states that she is frustrated because she is still having pain in her groin and right side of her abdomen that has now moved to chest.   Patient was informed that if she is having chest pain she need to go to the ED to be evaluated. Patient states that she does not have any money and odes not want to create medical bills that will stress her out.   Offered to see patient in clinic as well, however patient apprehensive about creating a medical bill.   Offered to move forward with trying to get her established with in network PCP at [facility]. Patient unsure.   Patient admits that her anxiety is up. Patient interested in seeing a Neuropsychiatrist that focuses on pain as suggested by Orthopedic specialist.   Patient would like for me to contact Health Department to see what mental health facilities are in network with [place] and to see if her aquatic therapy could be covered by [facility].   Go to  emergency department if you experience any more chest pain or abnormal pains to be evaluated.   Barbee Shropshire

## 2021-10-31 ENCOUNTER — Ambulatory Visit (HOSPITAL_BASED_OUTPATIENT_CLINIC_OR_DEPARTMENT_OTHER): Payer: Medicaid Other | Attending: Podiatry | Admitting: Physical Therapy

## 2021-10-31 ENCOUNTER — Encounter: Payer: Self-pay | Admitting: Nurse Practitioner

## 2021-10-31 ENCOUNTER — Other Ambulatory Visit: Payer: Self-pay

## 2021-10-31 ENCOUNTER — Encounter (HOSPITAL_BASED_OUTPATIENT_CLINIC_OR_DEPARTMENT_OTHER): Payer: Self-pay | Admitting: Physical Therapy

## 2021-10-31 DIAGNOSIS — M5030 Other cervical disc degeneration, unspecified cervical region: Secondary | ICD-10-CM | POA: Diagnosis not present

## 2021-10-31 DIAGNOSIS — M6281 Muscle weakness (generalized): Secondary | ICD-10-CM | POA: Insufficient documentation

## 2021-10-31 DIAGNOSIS — M62838 Other muscle spasm: Secondary | ICD-10-CM | POA: Insufficient documentation

## 2021-10-31 DIAGNOSIS — M542 Cervicalgia: Secondary | ICD-10-CM | POA: Diagnosis not present

## 2021-10-31 DIAGNOSIS — G8929 Other chronic pain: Secondary | ICD-10-CM | POA: Insufficient documentation

## 2021-10-31 DIAGNOSIS — M5459 Other low back pain: Secondary | ICD-10-CM | POA: Insufficient documentation

## 2021-10-31 DIAGNOSIS — M545 Low back pain, unspecified: Secondary | ICD-10-CM | POA: Insufficient documentation

## 2021-10-31 DIAGNOSIS — M5137 Other intervertebral disc degeneration, lumbosacral region: Secondary | ICD-10-CM | POA: Diagnosis not present
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
## 2021-11-01 ENCOUNTER — Ambulatory Visit (INDEPENDENT_AMBULATORY_CARE_PROVIDER_SITE_OTHER): Payer: Self-pay | Admitting: Nurse Practitioner

## 2021-11-01 VITALS — BP 111/75 | HR 94 | Temp 97.5°F | Ht 63.0 in | Wt 230.0 lb

## 2021-11-01 DIAGNOSIS — K449 Diaphragmatic hernia without obstruction or gangrene: Secondary | ICD-10-CM

## 2021-11-01 DIAGNOSIS — N921 Excessive and frequent menstruation with irregular cycle: Secondary | ICD-10-CM

## 2021-11-01 DIAGNOSIS — R519 Headache, unspecified: Secondary | ICD-10-CM

## 2021-11-01 DIAGNOSIS — F419 Anxiety disorder, unspecified: Secondary | ICD-10-CM

## 2021-11-01 DIAGNOSIS — K219 Gastro-esophageal reflux disease without esophagitis: Secondary | ICD-10-CM

## 2021-11-01 NOTE — Progress Notes (Unsigned)
   Subjective:    Patient ID: Meghan Carter, female    DOB: 1980/03/21, 41 y.o.   MRN: [id number]  HPI Follow up from [facility] telephone visit for headaches and continued menstrual bleeding, possible need for referral to neurolgy Wants to see hiatial hernia test results   Review of Systems     Objective:   Physical Exam        Assessment & Plan:

## 2021-11-02 ENCOUNTER — Encounter: Payer: Self-pay | Admitting: Nurse Practitioner

## 2021-11-03 ENCOUNTER — Encounter: Payer: Self-pay | Admitting: Nurse Practitioner

## 2021-11-04 ENCOUNTER — Ambulatory Visit: Payer: BC Managed Care – PPO | Admitting: Obstetrics and Gynecology

## 2021-11-09 ENCOUNTER — Ambulatory Visit (HOSPITAL_BASED_OUTPATIENT_CLINIC_OR_DEPARTMENT_OTHER): Payer: Medicaid Other | Attending: Podiatry | Admitting: Physical Therapy

## 2021-11-09 DIAGNOSIS — M5459 Other low back pain: Secondary | ICD-10-CM | POA: Diagnosis not present

## 2021-11-09 DIAGNOSIS — M62838 Other muscle spasm: Secondary | ICD-10-CM | POA: Diagnosis not present

## 2021-11-09 DIAGNOSIS — M542 Cervicalgia: Secondary | ICD-10-CM

## 2021-11-09 DIAGNOSIS — M6281 Muscle weakness (generalized): Secondary | ICD-10-CM
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
## 2021-11-10 ENCOUNTER — Ambulatory Visit (HOSPITAL_BASED_OUTPATIENT_CLINIC_OR_DEPARTMENT_OTHER): Payer: Self-pay | Admitting: Physical Therapy

## 2021-11-17 ENCOUNTER — Ambulatory Visit (HOSPITAL_BASED_OUTPATIENT_CLINIC_OR_DEPARTMENT_OTHER): Payer: Medicaid Other | Admitting: Physical Therapy

## 2021-11-17 ENCOUNTER — Encounter (HOSPITAL_BASED_OUTPATIENT_CLINIC_OR_DEPARTMENT_OTHER): Payer: Self-pay | Admitting: Physical Therapy

## 2021-11-17 DIAGNOSIS — M62838 Other muscle spasm: Secondary | ICD-10-CM | POA: Diagnosis not present

## 2021-11-17 DIAGNOSIS — M5459 Other low back pain: Secondary | ICD-10-CM

## 2021-11-17 DIAGNOSIS — M6281 Muscle weakness (generalized): Secondary | ICD-10-CM | POA: Diagnosis not present

## 2021-11-17 DIAGNOSIS — M542 Cervicalgia: Secondary | ICD-10-CM | POA: Diagnosis not present
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
## 2021-11-24 ENCOUNTER — Ambulatory Visit (HOSPITAL_BASED_OUTPATIENT_CLINIC_OR_DEPARTMENT_OTHER): Payer: Medicaid Other | Admitting: Physical Therapy

## 2021-11-24 ENCOUNTER — Encounter (HOSPITAL_BASED_OUTPATIENT_CLINIC_OR_DEPARTMENT_OTHER): Payer: Self-pay | Admitting: Physical Therapy

## 2021-11-24 DIAGNOSIS — M542 Cervicalgia: Secondary | ICD-10-CM | POA: Diagnosis not present

## 2021-11-24 DIAGNOSIS — M6281 Muscle weakness (generalized): Secondary | ICD-10-CM | POA: Diagnosis not present

## 2021-11-24 DIAGNOSIS — M5459 Other low back pain: Secondary | ICD-10-CM | POA: Diagnosis not present

## 2021-11-24 DIAGNOSIS — M62838 Other muscle spasm: Secondary | ICD-10-CM | POA: Diagnosis not present
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
## 2021-11-26 NOTE — Progress Notes (Unsigned)
GI Office Note    Referring Provider: Ameduite, Trenton Gammon, NP Primary Care Physician:  Ameduite, Trenton Gammon, NP Primary Gastroenterologist: Elon Alas. Abbey Chatters, DO  Date:  11/28/2021  ID:  Meghan Carter, DOB Apr 04, 1980, MRN [id number]   Chief Complaint   Chief Complaint  Patient presents with   Gastroesophageal Reflux    Was taking pepcid, states that it messes with her face stopped taking it the other day. Also has issues with constipation and is here regarding a hernia.   History of Present Illness  Meghan Carter is a 41 y.o. female with a history of anemia, depression, dysmenorrhea, constipation, endometriosis s/p hysteroscopy, HSV, rectal bleeding likely due to internal hemorrhoids s/p hemorrhoid banding presenting today for follow-up and to discuss her hiatal hernia.  Last colonoscopy February 2016 with redundant colon, counterpressure required to reach the cecum.  Moderate-sized internal hemorrhoids.  Had flex sigmoidoscopy in July 2017 for hemorrhoid banding.  CT A/P 09/16/2021: No acute findings.  No evidence pancreatitis.  Minimal hiatal hernia  Last seen in the office 10/04/2021 for constipation and GERD.  She reported a bowel movement every other day or every 3 days.  Having [place] 1 to [place] 3 stools with need to strain.  Noted a previous history of using a squatty potty but able to use recently given hysteroscopy.  Has occasional fullness/bloating.  Recently switched to fat-free milk and oat milk.  Has tried MiraLAX and Colace in the past but reported allergic reaction.  Advised magnesium citrate pills by her PCP but reports it being too expensive.  Advised the addition of fiber to her regimen and following a high-fiber diet.  We will trial Senokot 1-2 tablets daily.  Discussed pharmacologic management of constipation but difficult given her current work/income status.  Advised to continue famotidine twice daily.  She reported having extreme itching related to Senokot.   Therefore used liquid magnesium to help her with bowel movements.  Today: Famotidine caused her to have an allergic reaction to her face. Famotidine does help. Causing her to have rashes on her upper body as well. Taking mint tums to help with reflux. Not taking everyday.   Constipation is not improving. Has been taking magnesium citrate and milk of magnesia. Has been taking a smoothie everyday and that is not helping with her constipation either.  Magnesium citrate 9/12, milk of magnesia 10/1, milk of magnesia on 10/17. May be eating a little bit of red meat. Does not eat fried foods, has been using baked meats, eats special K breakfast cereal. May have cheerios with oats and nuts.   Reports she has a mole between her breast that has started having a smell to it. Was prescribed a powder. Unable to lay on her left and right side.   Has endometriosis surgery in January. Her mental state is declining related to it.   Pain under her ribs on both sides when trying to lay on one side or the other. Since surgery in July she has been unable to lay on her sides. She has numbness and tingling in her hands/wrist area.    Still struggling with insurance and disability. Has [employer] right now. Has been working with social services right now. Awaiting Medicaid right now.    Current Outpatient Medications  Medication Sig Dispense Refill   nystatin (MYCOSTATIN/NYSTOP) powder Apply 1 Application topically 3 (three) times daily. 15 g 0   PRESCRIPTION MEDICATION norethindrone     No current facility-administered medications for this visit.  Past Medical History:  Diagnosis Date   Acute blood loss anemia 03/26/2015   Anemia    Anxiety    Atherosclerosis of arteries    Blood transfusion without reported diagnosis 2017   2 units, no reaction   Callus of foot 02/08/2011   Depression    Dysmenorrhea    Endometrial polyp 02/11/2014   Fecal occult blood test positive 01/27/2014   Fecal occult blood test  positive 01/27/2014   Fibroid    High-tone pelvic floor dysfunction 03/17/2015   History of abnormal cervical Pap smear 02/11/2014   History of acute pancreatitis 03/31/2011   HSV (herpes simplex virus) infection    Hydrosalpinx    Bilateral   Internal hemorrhoids    Lichenification and lichen simplex chronicus 11/07/2010   Lower abdominal pain    Menorrhagia with regular cycle 01/27/2014   Menorrhagia with regular cycle 01/27/2014   Other fatigue    Pelvic pain in female 01/27/2014   Plantar fasciitis 02/08/2011   Rectal bleeding 01/27/2014   Rectal pain 03/19/2014   Severe anemia    Vaginal Pap smear, abnormal     Past Surgical History:  Procedure Laterality Date   CHROMOPERTUBATION  08/14/2017   Procedure: CHROMOPERTUBATION;  Surgeon: Governor Specking, MD;  Location: [facility];  Service: Gynecology;;   COLONOSCOPY N/A 03/23/2014   SLF: 1. The examined terminal ileum appeared to be normal. 2. The left colon is redundant 3. Moderate sized internal hemorrhoids   ECTOPIC PREGNANCY SURGERY Left 2005   FLEXIBLE SIGMOIDOSCOPY N/A 04/01/2015   SLF: rectal bleeding due to large internal hemorrhoids 2. anemia due to rectal bleeding/memses/ poor iron intake.   FLEXIBLE SIGMOIDOSCOPY N/A 03/27/2015   Procedure: FLEXIBLE SIGMOIDOSCOPY;  Surgeon: Danie Binder, MD;  Location: [facility];  Service: Endoscopy;  Laterality: N/A;   HEMORRHOID BANDING N/A 03/23/2014   Procedure: HEMORRHOID BANDING;  Surgeon: Danie Binder, MD;  Location: [facility];  Service: Endoscopy;  Laterality: N/A;   HEMORRHOID BANDING  03/27/2015   Procedure: HEMORRHOID BANDING;  Surgeon: Danie Binder, MD;  Location: [facility];  Service: Endoscopy;;   HEMORRHOID SURGERY N/A 01/11/2018   Procedure: EXTENSIVE HEMORRHOIDECTOMY;  Surgeon: Aviva Signs, MD;  Location: [facility];  Service: General;  Laterality: N/A;   HYSTEROSCOPY WITH D & C N/A 06/16/2014   Procedure: DILATATION AND CURETTAGE /HYSTEROSCOPY;  Surgeon: Jonnie Kind, MD;  Location: [facility];  Service: Gynecology;  Laterality: N/A;   LAPAROSCOPIC UNILATERAL SALPINGECTOMY Right 06/16/2014   Procedure: LAPAROSCOPIC UNILATERAL SALPINGECTOMY;  Surgeon: Jonnie Kind, MD;  Location: [facility];  Service: Gynecology;  Laterality: Right;   LAPAROSCOPY N/A 08/14/2017   Procedure: OPERATIVE LAPAROSCOPY ,LEFT SALPINGECTOMY, LYSIS OF ADHESIONS;  Surgeon: Governor Specking, MD;  Location: [facility];  Service: Gynecology;  Laterality: N/A;   POLYPECTOMY N/A 06/16/2014   Procedure: POLYPECTOMY (endometrial);  Surgeon: Jonnie Kind, MD;  Location: [facility];  Service: Gynecology;  Laterality: N/A;    Family History  Problem Relation Age of Onset   Diabetes Father    Diabetes Paternal Grandmother    Diabetes Paternal Grandfather     Allergies as of 11/28/2021 - Review Complete 11/28/2021  Allergen Reaction Noted   Clindamycin Itching 01/14/2019   Clindamycin/lincomycin Itching 01/14/2019   Amoxil [amoxicillin]  04/30/2020   Colace [docusate] Itching 11/26/2021   Miralax [polyethylene glycol] Itching 11/26/2021   Prednisone  06/29/2020   Senna Itching 10/20/2021   Cymbalta [duloxetine hcl] Rash 06/09/2021   Doxycycline hyclate  Rash 12/27/2017   Doxycycline hyclate Rash 12/27/2017   Gabapentin Rash 06/09/2021   Ibuprofen Rash 10/06/2021   Meloxicam Rash 07/06/2020   Tramadol Rash 04/05/2021    Social History   Socioeconomic History   Marital status: Divorced    Spouse name: Not on file   Number of children: Not on file   Years of education: Not on file   Highest education level: Not on file  Occupational History   Not on file  Tobacco Use   Smoking status: Every Day    Packs/day: 0.25    Years: 27.00    Total pack years: 6.75    Types: Cigarettes    Passive exposure: Current   Smokeless tobacco: Never  Vaping Use   Vaping Use: Never used  Substance and Sexual Activity   Alcohol use: Not Currently   Drug use: No   Sexual activity: Not Currently     Partners: Male    Birth control/protection: None  Other Topics Concern   Not on file  Social History Narrative   Not on file   Social Determinants of Health   Financial Resource Strain: Not on file  Food Insecurity: Not on file  Transportation Needs: Not on file  Physical Activity: Not on file  Stress: Not on file  Social Connections: Not on file     Review of Systems   Gen: Denies fever, chills, anorexia. Denies fatigue, weakness, weight loss.  CV: Denies chest pain, palpitations, syncope, peripheral edema, and claudication. Resp: Denies dyspnea at rest, cough, wheezing, coughing up blood, and pleurisy. GI: See HPI Derm: Denies rash, itching, dry skin Psych: Denies depression, anxiety, memory loss, confusion. No homicidal or suicidal ideation.  Heme: Denies bruising, bleeding, and enlarged lymph nodes.   Physical Exam   BP 112/75 (BP Location: Right Arm, Patient Position: Sitting, Cuff Size: Large)   Pulse 98   Temp 98.4 F (36.9 C) (Oral)   Ht '5\' 3"'$  (1.6 m)   Wt 229 lb (103.9 kg)   LMP 10/29/2021 (Exact Date)   SpO2 98%   BMI 40.57 kg/m   General:   Alert and oriented. No distress noted. Pleasant and cooperative.  Head:  Normocephalic and atraumatic. Eyes:  Conjuctiva clear without scleral icterus. Mouth:  Oral mucosa pink and moist. Good dentition. No lesions. Lungs:  Clear to auscultation bilaterally. No wheezes, rales, or rhonchi. No distress.  Heart:  S1, S2 present without murmurs appreciated.  Abdomen:  +BS, soft, mildly distended.  Tenderness noted to mid lower abdomen and left lower quadrant primarily.  Upper abdominal tenderness noted throughout.  No rebound or guarding. No HSM or masses noted. Rectal: Deferred Msk:  Symmetrical without gross deformities. Normal posture. Extremities:  Without edema. Neurologic:  Alert and  oriented x4 Psych:  Alert and cooperative. Normal mood and affect.   Assessment  Meghan Carter is a 41 y.o. female with a  history of anemia, depression, dysmenorrhea, constipation, endometriosis s/p hysteroscopy, HSV, rectal bleeding likely due to internal hemorrhoids s/p hemorrhoid banding presenting today for follow-up and to discuss her hiatal hernia.  GERD, hiatal hernia: Minimal hiatal hernia noted on CT imaging in August 2023.  Previously reported some intermittent right upper quadrant pain, upper abdominal fullness, and chest heaviness at times.  Previously reported her reflux symptoms are fairly controlled on twice daily Pepcid and this remains the case however it appears that she has some dermatologic side effects from taking it.  Using mint Tums to help with reflux breakthrough  symptoms, but not very frequently.  She presented as well today to discuss her hiatal hernia to see if that is contributing to any of her symptoms.  She queried whether or not.  Her hernia could be pushing on her midsternal area between her breasts where she also has a normal and has been in have an odor that has been concerning her.  She reports she is not eating any fried foods, is primarily baking her meats and is drinking a smoothie every day.  She has been doing her best to follow GERD diet.  Given her concerns about her hiatal hernia as well as some of her ongoing upper abdominal discomfort including pain under her left and right rib cage, we will proceed with an upper endoscopy at the time of colonoscopy with Dr. Abbey Chatters in the near future.  I advised him that she may continue famotidine if she feels like it is helpful or she may stop and try to monitor for symptoms and use Tums as needed.  She is not interested in pursuing any further pharmacologic management at this time, more interested in confirming diagnosis and having a more targeted treatment plan.  Abdominal pain: Continues to have upper abdominal pain on her ribs on both sides.  She states that this been occurring since her prior abdominal surgery in July.  She also notes some  numbness and tingling to her hands/wrists when lying in bed.  She is not able to sleep on her stomach per her surgeon, therefore she is to sleep on her left or right side and now has not been able to use either and does not sleep well her back.  This has been affecting her quality of life as well as her constipation.  Her prior CT in August was without any acute findings, no evidence of pancreatitis, only had a minimal hiatal hernia mentioned.  We will further evaluate her pain endoscopically for any possible etiology.  Pain could be multifactorial.  She has been undergoing evaluation with GYN regarding endometriosis.  It is possible that endometriosis is significantly contributing to her abdominal pain.  Given her history of constipation as well, this likely is also contributing to her abdominal pain lower and potentially some upper abdominal pain as well.  Upper abdominal symptoms could be explained by gastritis, duodenitis, esophagitis.  Unlikely to be due to gallbladder or pancreas given recent normal CT scan.   Constipation, Bloating: Not much improvement since her prior visit.  She has failed MiraLAX and Colace in the past due to allergic reactions.  We attempted Senokot after her last visit but she reported extreme pruritus with this despite hydroxyzine.  Previously had had good relief with magnesium citrate and milk of magnesia which she has been using this as needed since her last visit.  She is concerned of using these medications long-term and would like to try to find a potential cause for constipation.  We discussed today that at times constipation is idiopathic however given her significant bloating and the effect her symptoms are having on her daily life we can evaluate with a colonoscopy.  Her last colonoscopy was in 2016 in which she was noted to have a redundant colon.  I also advised her that I do not like the long-term use of magnesium citrate, milk of magnesia, enemas, or suppositories.  She  has agreed to  trial Linzess 145 mg daily.  We discussed that there can be some initial loose stools with this.  If she begins  to have any sort of itching or reaction to the medication that she may try opening the capsule including the contents of water and taking the medication in this manner.  We will provide some samples for her to try.  PLAN   Proceed with upper endoscopy and colonoscopy with propofol by Dr. Abbey Chatters in near future: the risks, benefits, and alternatives have been discussed with the patient in detail. The patient states understanding and desires to proceed. ASA  3(BMI) No PEG-based prep given possible allergy to MiraLAX, likely will need Clenpiq, Suprep, or Sutab Full 2 days of clear liquids. Will need Linzess 145 mcg daily for 4 days prior to procedure if no allergic reaction during trial period in order to ensure inadequate prep. Continue famotidine daily if you wish or stop and monitor for symptoms.  Tums as needed.  Linzess 145 mcg daily 30 minutes prior to meals, samples provided today.  Follow up 6-8 weeks after procedures.    Venetia Night, MSN, FNP-BC, AGACNP-BC [facility]

## 2021-11-26 NOTE — H&P (View-Only) (Signed)
GI Office Note    Referring Provider: Ameduite, Trenton Gammon, NP Primary Care Physician:  Ameduite, Trenton Gammon, NP Primary Gastroenterologist: Elon Alas. Abbey Chatters, DO  Date:  11/28/2021  ID:  [name], DOB 09-05-1980, MRN [id number]   Chief Complaint   Chief Complaint  Patient presents with   Gastroesophageal Reflux    Was taking pepcid, states that it messes with her face stopped taking it the other day. Also has issues with constipation and is here regarding a hernia.   History of Present Illness  Ameisha R Wanek is a 41 y.o. female with a history of anemia, depression, dysmenorrhea, constipation, endometriosis s/p hysteroscopy, HSV, rectal bleeding likely due to internal hemorrhoids s/p hemorrhoid banding presenting today for follow-up and to discuss her hiatal hernia.  Last colonoscopy February 2016 with redundant colon, counterpressure required to reach the cecum.  Moderate-sized internal hemorrhoids.  Had flex sigmoidoscopy in July 2017 for hemorrhoid banding.  CT A/P 09/16/2021: No acute findings.  No evidence pancreatitis.  Minimal hiatal hernia  Last seen in the office 10/04/2021 for constipation and GERD.  She reported a bowel movement every other day or every 3 days.  Having [place] 1 to [place] 3 stools with need to strain.  Noted a previous history of using a squatty potty but able to use recently given hysteroscopy.  Has occasional fullness/bloating.  Recently switched to fat-free milk and oat milk.  Has tried MiraLAX and Colace in the past but reported allergic reaction.  Advised magnesium citrate pills by her PCP but reports it being too expensive.  Advised the addition of fiber to her regimen and following a high-fiber diet.  We will trial Senokot 1-2 tablets daily.  Discussed pharmacologic management of constipation but difficult given her current work/income status.  Advised to continue famotidine twice daily.  She reported having extreme itching related to Senokot.   Therefore used liquid magnesium to help her with bowel movements.  Today: Famotidine caused her to have an allergic reaction to her face. Famotidine does help. Causing her to have rashes on her upper body as well. Taking mint tums to help with reflux. Not taking everyday.   Constipation is not improving. Has been taking magnesium citrate and milk of magnesia. Has been taking a smoothie everyday and that is not helping with her constipation either.  Magnesium citrate 9/12, milk of magnesia 10/1, milk of magnesia on 10/17. May be eating a little bit of red meat. Does not eat fried foods, has been using baked meats, eats special K breakfast cereal. May have cheerios with oats and nuts.   Reports she has a mole between her breast that has started having a smell to it. Was prescribed a powder. Unable to lay on her left and right side.   Has endometriosis surgery in January. Her mental state is declining related to it.   Pain under her ribs on both sides when trying to lay on one side or the other. Since surgery in July she has been unable to lay on her sides. She has numbness and tingling in her hands/wrist area.    Still struggling with insurance and disability. Has [employer] right now. Has been working with social services right now. Awaiting Medicaid right now.    Current Outpatient Medications  Medication Sig Dispense Refill   nystatin (MYCOSTATIN/NYSTOP) powder Apply 1 Application topically 3 (three) times daily. 15 g 0   PRESCRIPTION MEDICATION norethindrone     No current facility-administered medications for this visit.  Past Medical History:  Diagnosis Date   Acute blood loss anemia 03/26/2015   Anemia    Anxiety    Atherosclerosis of arteries    Blood transfusion without reported diagnosis 2017   2 units, no reaction   Callus of foot 02/08/2011   Depression    Dysmenorrhea    Endometrial polyp 02/11/2014   Fecal occult blood test positive 01/27/2014   Fecal occult blood test  positive 01/27/2014   Fibroid    High-tone pelvic floor dysfunction 03/17/2015   History of abnormal cervical Pap smear 02/11/2014   History of acute pancreatitis 03/31/2011   HSV (herpes simplex virus) infection    Hydrosalpinx    Bilateral   Internal hemorrhoids    Lichenification and lichen simplex chronicus 11/07/2010   Lower abdominal pain    Menorrhagia with regular cycle 01/27/2014   Menorrhagia with regular cycle 01/27/2014   Other fatigue    Pelvic pain in female 01/27/2014   Plantar fasciitis 02/08/2011   Rectal bleeding 01/27/2014   Rectal pain 03/19/2014   Severe anemia    Vaginal Pap smear, abnormal     Past Surgical History:  Procedure Laterality Date   CHROMOPERTUBATION  08/14/2017   Procedure: CHROMOPERTUBATION;  Surgeon: Governor Specking, MD;  Location: [facility];  Service: Gynecology;;   COLONOSCOPY N/A 03/23/2014   SLF: 1. The examined terminal ileum appeared to be normal. 2. The left colon is redundant 3. Moderate sized internal hemorrhoids   ECTOPIC PREGNANCY SURGERY Left 2005   FLEXIBLE SIGMOIDOSCOPY N/A 04/01/2015   SLF: rectal bleeding due to large internal hemorrhoids 2. anemia due to rectal bleeding/memses/ poor iron intake.   FLEXIBLE SIGMOIDOSCOPY N/A 03/27/2015   Procedure: FLEXIBLE SIGMOIDOSCOPY;  Surgeon: Danie Binder, MD;  Location: [facility];  Service: Endoscopy;  Laterality: N/A;   HEMORRHOID BANDING N/A 03/23/2014   Procedure: HEMORRHOID BANDING;  Surgeon: Danie Binder, MD;  Location: [facility];  Service: Endoscopy;  Laterality: N/A;   HEMORRHOID BANDING  03/27/2015   Procedure: HEMORRHOID BANDING;  Surgeon: Danie Binder, MD;  Location: [facility];  Service: Endoscopy;;   HEMORRHOID SURGERY N/A 01/11/2018   Procedure: EXTENSIVE HEMORRHOIDECTOMY;  Surgeon: Aviva Signs, MD;  Location: [facility];  Service: General;  Laterality: N/A;   HYSTEROSCOPY WITH D & C N/A 06/16/2014   Procedure: DILATATION AND CURETTAGE /HYSTEROSCOPY;  Surgeon: Jonnie Kind, MD;  Location: [facility];  Service: Gynecology;  Laterality: N/A;   LAPAROSCOPIC UNILATERAL SALPINGECTOMY Right 06/16/2014   Procedure: LAPAROSCOPIC UNILATERAL SALPINGECTOMY;  Surgeon: Jonnie Kind, MD;  Location: [facility];  Service: Gynecology;  Laterality: Right;   LAPAROSCOPY N/A 08/14/2017   Procedure: OPERATIVE LAPAROSCOPY ,LEFT SALPINGECTOMY, LYSIS OF ADHESIONS;  Surgeon: Governor Specking, MD;  Location: [facility];  Service: Gynecology;  Laterality: N/A;   POLYPECTOMY N/A 06/16/2014   Procedure: POLYPECTOMY (endometrial);  Surgeon: Jonnie Kind, MD;  Location: [facility];  Service: Gynecology;  Laterality: N/A;    Family History  Problem Relation Age of Onset   Diabetes Father    Diabetes Paternal Grandmother    Diabetes Paternal Grandfather     Allergies as of 11/28/2021 - Review Complete 11/28/2021  Allergen Reaction Noted   Clindamycin Itching 01/14/2019   Clindamycin/lincomycin Itching 01/14/2019   Amoxil [amoxicillin]  04/30/2020   Colace [docusate] Itching 11/26/2021   Miralax [polyethylene glycol] Itching 11/26/2021   Prednisone  06/29/2020   Senna Itching 10/20/2021   Cymbalta [duloxetine hcl] Rash 06/09/2021   Doxycycline hyclate  Rash 12/27/2017   Doxycycline hyclate Rash 12/27/2017   Gabapentin Rash 06/09/2021   Ibuprofen Rash 10/06/2021   Meloxicam Rash 07/06/2020   Tramadol Rash 04/05/2021    Social History   Socioeconomic History   Marital status: Divorced    Spouse name: Not on file   Number of children: Not on file   Years of education: Not on file   Highest education level: Not on file  Occupational History   Not on file  Tobacco Use   Smoking status: Every Day    Packs/day: 0.25    Years: 27.00    Total pack years: 6.75    Types: Cigarettes    Passive exposure: Current   Smokeless tobacco: Never  Vaping Use   Vaping Use: Never used  Substance and Sexual Activity   Alcohol use: Not Currently   Drug use: No   Sexual activity: Not Currently     Partners: Male    Birth control/protection: None  Other Topics Concern   Not on file  Social History Narrative   Not on file   Social Determinants of Health   Financial Resource Strain: Not on file  Food Insecurity: Not on file  Transportation Needs: Not on file  Physical Activity: Not on file  Stress: Not on file  Social Connections: Not on file     Review of Systems   Gen: Denies fever, chills, anorexia. Denies fatigue, weakness, weight loss.  CV: Denies chest pain, palpitations, syncope, peripheral edema, and claudication. Resp: Denies dyspnea at rest, cough, wheezing, coughing up blood, and pleurisy. GI: See HPI Derm: Denies rash, itching, dry skin Psych: Denies depression, anxiety, memory loss, confusion. No homicidal or suicidal ideation.  Heme: Denies bruising, bleeding, and enlarged lymph nodes.   Physical Exam   BP 112/75 (BP Location: Right Arm, Patient Position: Sitting, Cuff Size: Large)   Pulse 98   Temp 98.4 F (36.9 C) (Oral)   Ht '5\' 3"'$  (1.6 m)   Wt 229 lb (103.9 kg)   LMP 10/29/2021 (Exact Date)   SpO2 98%   BMI 40.57 kg/m   General:   Alert and oriented. No distress noted. Pleasant and cooperative.  Head:  Normocephalic and atraumatic. Eyes:  Conjuctiva clear without scleral icterus. Mouth:  Oral mucosa pink and moist. Good dentition. No lesions. Lungs:  Clear to auscultation bilaterally. No wheezes, rales, or rhonchi. No distress.  Heart:  S1, S2 present without murmurs appreciated.  Abdomen:  +BS, soft, mildly distended.  Tenderness noted to mid lower abdomen and left lower quadrant primarily.  Upper abdominal tenderness noted throughout.  No rebound or guarding. No HSM or masses noted. Rectal: Deferred Msk:  Symmetrical without gross deformities. Normal posture. Extremities:  Without edema. Neurologic:  Alert and  oriented x4 Psych:  Alert and cooperative. Normal mood and affect.   Assessment  [place] SHAWANNA ZANDERS is a 41 y.o. female with a  history of anemia, depression, dysmenorrhea, constipation, endometriosis s/p hysteroscopy, HSV, rectal bleeding likely due to internal hemorrhoids s/p hemorrhoid banding presenting today for follow-up and to discuss her hiatal hernia.  GERD, hiatal hernia: Minimal hiatal hernia noted on CT imaging in August 2023.  Previously reported some intermittent right upper quadrant pain, upper abdominal fullness, and chest heaviness at times.  Previously reported her reflux symptoms are fairly controlled on twice daily Pepcid and this remains the case however it appears that she has some dermatologic side effects from taking it.  Using mint Tums to help with reflux breakthrough  symptoms, but not very frequently.  She presented as well today to discuss her hiatal hernia to see if that is contributing to any of her symptoms.  She queried whether or not.  Her hernia could be pushing on her midsternal area between her breasts where she also has a normal and has been in have an odor that has been concerning her.  She reports she is not eating any fried foods, is primarily baking her meats and is drinking a smoothie every day.  She has been doing her best to follow GERD diet.  Given her concerns about her hiatal hernia as well as some of her ongoing upper abdominal discomfort including pain under her left and right rib cage, we will proceed with an upper endoscopy at the time of colonoscopy with Dr. Abbey Chatters in the near future.  I advised him that she may continue famotidine if she feels like it is helpful or she may stop and try to monitor for symptoms and use Tums as needed.  She is not interested in pursuing any further pharmacologic management at this time, more interested in confirming diagnosis and having a more targeted treatment plan.  Abdominal pain: Continues to have upper abdominal pain on her ribs on both sides.  She states that this been occurring since her prior abdominal surgery in July.  She also notes some  numbness and tingling to her hands/wrists when lying in bed.  She is not able to sleep on her stomach per her surgeon, therefore she is to sleep on her left or right side and now has not been able to use either and does not sleep well her back.  This has been affecting her quality of life as well as her constipation.  Her prior CT in August was without any acute findings, no evidence of pancreatitis, only had a minimal hiatal hernia mentioned.  We will further evaluate her pain endoscopically for any possible etiology.  Pain could be multifactorial.  She has been undergoing evaluation with GYN regarding endometriosis.  It is possible that endometriosis is significantly contributing to her abdominal pain.  Given her history of constipation as well, this likely is also contributing to her abdominal pain lower and potentially some upper abdominal pain as well.  Upper abdominal symptoms could be explained by gastritis, duodenitis, esophagitis.  Unlikely to be due to gallbladder or pancreas given recent normal CT scan.   Constipation, Bloating: Not much improvement since her prior visit.  She has failed MiraLAX and Colace in the past due to allergic reactions.  We attempted Senokot after her last visit but she reported extreme pruritus with this despite hydroxyzine.  Previously had had good relief with magnesium citrate and milk of magnesia which she has been using this as needed since her last visit.  She is concerned of using these medications long-term and would like to try to find a potential cause for constipation.  We discussed today that at times constipation is idiopathic however given her significant bloating and the effect her symptoms are having on her daily life we can evaluate with a colonoscopy.  Her last colonoscopy was in 2016 in which she was noted to have a redundant colon.  I also advised her that I do not like the long-term use of magnesium citrate, milk of magnesia, enemas, or suppositories.  She  has agreed to  trial Linzess 145 mg daily.  We discussed that there can be some initial loose stools with this.  If she begins  to have any sort of itching or reaction to the medication that she may try opening the capsule including the contents of water and taking the medication in this manner.  We will provide some samples for her to try.  PLAN   Proceed with upper endoscopy and colonoscopy with propofol by Dr. Abbey Chatters in near future: the risks, benefits, and alternatives have been discussed with the patient in detail. The patient states understanding and desires to proceed. ASA  3(BMI) No PEG-based prep given possible allergy to MiraLAX, likely will need Clenpiq, Suprep, or Sutab Full 2 days of clear liquids. Will need Linzess 145 mcg daily for 4 days prior to procedure if no allergic reaction during trial period in order to ensure inadequate prep. Continue famotidine daily if you wish or stop and monitor for symptoms.  Tums as needed.  Linzess 145 mcg daily 30 minutes prior to meals, samples provided today.  Follow up 6-8 weeks after procedures.    Venetia Night, MSN, FNP-BC, AGACNP-BC [facility]

## 2021-11-28 ENCOUNTER — Encounter (HOSPITAL_BASED_OUTPATIENT_CLINIC_OR_DEPARTMENT_OTHER): Payer: Self-pay | Admitting: Physical Therapy

## 2021-11-28 ENCOUNTER — Encounter: Payer: Self-pay | Admitting: Gastroenterology

## 2021-11-28 ENCOUNTER — Encounter: Payer: Self-pay | Admitting: *Deleted

## 2021-11-28 ENCOUNTER — Ambulatory Visit (INDEPENDENT_AMBULATORY_CARE_PROVIDER_SITE_OTHER): Payer: BC Managed Care – PPO | Admitting: Gastroenterology

## 2021-11-28 VITALS — BP 112/75 | HR 98 | Temp 98.4°F | Ht 63.0 in | Wt 229.0 lb

## 2021-11-28 DIAGNOSIS — K5904 Chronic idiopathic constipation: Secondary | ICD-10-CM

## 2021-11-28 DIAGNOSIS — R140 Abdominal distension (gaseous): Secondary | ICD-10-CM

## 2021-11-28 DIAGNOSIS — K219 Gastro-esophageal reflux disease without esophagitis: Secondary | ICD-10-CM

## 2021-11-28 DIAGNOSIS — R1010 Upper abdominal pain, unspecified: Secondary | ICD-10-CM

## 2021-11-28 NOTE — Patient Instructions (Addendum)
We are scheduling for an upper endoscopy and colonoscopy in the near future with Dr. Abbey Chatters.  You may continue taking your famotidine daily if you feel like it is helpful.  If you feel you are having too many side effects from it you may use Tums as needed but I do not recommend using them on a daily basis as you could be consuming too much calcium if this occurs.  Continue to follow a GERD diet:  Avoid fried, fatty, greasy, spicy, citrus foods. Avoid caffeine and carbonated beverages. Avoid chocolate. Try eating 4-6 small meals a day rather than 3 large meals. Do not eat within 3 hours of laying down. Prop head of bed up on wood or bricks to create a 6 inch incline.  I want you to begin taking Linzess 145 mcg daily, 30 minutes prior to meals. How to take Linzess: Once a day every day on empty stomach, at least 30 minutes before your first meal of the day. It is best to keep medications at a stable temperature Medication is best kept in its original bottle with the disket present.  It is a medication that is meant for everyday use and not to be used as needed.   What to expect: Constipation relief is typically felt in about 1 week Relief of abdominal pain, discomfort, and bloating begins in about 1 week with symptoms typically improving over 12 weeks and beyond. Diarrhea is most common side effect and typically begins within the first 2 weeks and can take 3-4 weeks to resolve It would be helpful to begin treatment over the weekend or when you can be closer to a bathroom   You can go to [email or web address] for patient support and sign up for daily medication reminders.   Check the samples you have at home, if these are expired, please do not take them as they are likely not going to work.  Continue following a high-fiber diet.  We will follow-up 6 weeks after your procedures, may follow-up with Dr. Abbey Chatters.  It was a pleasure to see you today. I want to create trusting relationships  with patients. If you receive a survey regarding your visit,  I greatly appreciate you taking time to fill this out on paper or through your MyChart. I value your feedback.  Venetia Night, MSN, FNP-BC, AGACNP-BC [facility]

## 2021-11-29 ENCOUNTER — Ambulatory Visit (HOSPITAL_BASED_OUTPATIENT_CLINIC_OR_DEPARTMENT_OTHER): Payer: Medicaid Other | Admitting: Physical Therapy

## 2021-11-29 ENCOUNTER — Encounter (HOSPITAL_BASED_OUTPATIENT_CLINIC_OR_DEPARTMENT_OTHER): Payer: Self-pay | Admitting: Physical Therapy

## 2021-11-29 ENCOUNTER — Encounter: Payer: Self-pay | Admitting: *Deleted

## 2021-11-29 ENCOUNTER — Telehealth: Payer: Self-pay | Admitting: *Deleted

## 2021-11-29 DIAGNOSIS — M62838 Other muscle spasm: Secondary | ICD-10-CM

## 2021-11-29 DIAGNOSIS — M5459 Other low back pain: Secondary | ICD-10-CM | POA: Diagnosis not present

## 2021-11-29 DIAGNOSIS — M542 Cervicalgia: Secondary | ICD-10-CM

## 2021-11-29 DIAGNOSIS — M6281 Muscle weakness (generalized): Secondary | ICD-10-CM

## 2021-11-29 NOTE — Therapy (Signed)
OUTPATIENT PHYSICAL THERAPY THORACOLUMBAR TREATMENT   Patient Name: Meghan Carter MRN: [id number] DOB:[birth year], 41 y.o., female Today's Date: 11/29/2021   PT End of Session - 11/29/21 1457     Visit Number 5    Number of Visits 18    Date for PT Re-Evaluation 10/13/21    Authorization Type [employer]    PT Start Time 1447    PT Stop Time 1529    PT Time Calculation (min) 42 min    Activity Tolerance Patient tolerated treatment well    Behavior During Therapy [facility] for tasks assessed/performed              Past Medical History:  Diagnosis Date   Acute blood loss anemia 03/26/2015   Anemia    Anxiety    Atherosclerosis of arteries    Blood transfusion without reported diagnosis 2017   2 units, no reaction   Callus of foot 02/08/2011   Depression    Dysmenorrhea    Endometrial polyp 02/11/2014   Fecal occult blood test positive 01/27/2014   Fecal occult blood test positive 01/27/2014   Fibroid    High-tone pelvic floor dysfunction 03/17/2015   History of abnormal cervical Pap smear 02/11/2014   History of acute pancreatitis 03/31/2011   HSV (herpes simplex virus) infection    Hydrosalpinx    Bilateral   Internal hemorrhoids    Lichenification and lichen simplex chronicus 11/07/2010   Lower abdominal pain    Menorrhagia with regular cycle 01/27/2014   Menorrhagia with regular cycle 01/27/2014   Other fatigue    Pelvic pain in female 01/27/2014   Plantar fasciitis 02/08/2011   Rectal bleeding 01/27/2014   Rectal pain 03/19/2014   Severe anemia    Vaginal Pap smear, abnormal    Past Surgical History:  Procedure Laterality Date   CHROMOPERTUBATION  08/14/2017   Procedure: CHROMOPERTUBATION;  Surgeon: Governor Specking, MD;  Location: [facility];  Service: Gynecology;;   COLONOSCOPY N/A 03/23/2014   SLF: 1. The examined terminal ileum appeared to be normal. 2. The left colon is redundant 3. Moderate sized internal hemorrhoids   ECTOPIC PREGNANCY SURGERY Left 2005   FLEXIBLE  SIGMOIDOSCOPY N/A 04/01/2015   SLF: rectal bleeding due to large internal hemorrhoids 2. anemia due to rectal bleeding/memses/ poor iron intake.   FLEXIBLE SIGMOIDOSCOPY N/A 03/27/2015   Procedure: FLEXIBLE SIGMOIDOSCOPY;  Surgeon: Danie Binder, MD;  Location: [facility];  Service: Endoscopy;  Laterality: N/A;   HEMORRHOID BANDING N/A 03/23/2014   Procedure: HEMORRHOID BANDING;  Surgeon: Danie Binder, MD;  Location: [facility];  Service: Endoscopy;  Laterality: N/A;   HEMORRHOID BANDING  03/27/2015   Procedure: HEMORRHOID BANDING;  Surgeon: Danie Binder, MD;  Location: [facility];  Service: Endoscopy;;   HEMORRHOID SURGERY N/A 01/11/2018   Procedure: EXTENSIVE HEMORRHOIDECTOMY;  Surgeon: Aviva Signs, MD;  Location: [facility];  Service: General;  Laterality: N/A;   HYSTEROSCOPY WITH D & C N/A 06/16/2014   Procedure: DILATATION AND CURETTAGE /HYSTEROSCOPY;  Surgeon: Jonnie Kind, MD;  Location: [facility];  Service: Gynecology;  Laterality: N/A;   LAPAROSCOPIC UNILATERAL SALPINGECTOMY Right 06/16/2014   Procedure: LAPAROSCOPIC UNILATERAL SALPINGECTOMY;  Surgeon: Jonnie Kind, MD;  Location: [facility];  Service: Gynecology;  Laterality: Right;   LAPAROSCOPY N/A 08/14/2017   Procedure: OPERATIVE LAPAROSCOPY ,LEFT SALPINGECTOMY, LYSIS OF ADHESIONS;  Surgeon: Governor Specking, MD;  Location: [facility];  Service: Gynecology;  Laterality: N/A;   POLYPECTOMY N/A 06/16/2014   Procedure:  POLYPECTOMY (endometrial);  Surgeon: Jonnie Kind, MD;  Location: [facility];  Service: Gynecology;  Laterality: N/A;   Patient Active Problem List   Diagnosis Date Noted   Lichen planus 93/79/0240   Spine pain, cervical 01/25/2021   Acute post-traumatic headache, not intractable 01/25/2021   Encounter for support and coordination of transition of care 01/25/2021   Leiomyoma 12/29/2020   Morbid obesity ([place]) 12/29/2020   Enlarged thyroid 06/29/2020   Constipation 06/29/2020   Annual physical exam 01/23/2019    Recurrent major depressive disorder, in partial remission ([place]) 01/14/2019   Depression, major, single episode, moderate ([place]) 11/19/2018   Anxiety 11/19/2018   Herpes simplex type 2 infection 01/24/2018   Internal and external bleeding hemorrhoids    History of abnormal cervical Pap smear 02/11/2014   Hemorrhoids, internal 01/27/2014   Nondependent alcohol abuse, episodic drinking behavior 03/31/2011    PCP: [facility], Trenton Gammon, NP  REFERRING PROVIDER: Carole Civil, MD  Porfirio Mylar, FNP (pelvic referral)  REFERRING DIAG:  M50.30 (ICD-10-CM) - DDD (degenerative disc disease), cervical  M51.37 (ICD-10-CM) - DDD (degenerative disc disease), lumbosacral  M54.50,G89.29 (ICD-10-CM) - Chronic midline low back pain, unspecified whether sciatica present   R10.2 (ICD-10-CM) - Pelvic and perineal pain  G89.29 (ICD-10-CM) - Other chronic pain  M54.50 (ICD-10-CM) - Low back pain, unspecified  M25.551 (ICD-10-CM) - Pain in right hip  M25.552 (ICD-10-CM) - Pain in left hip   Rationale for Evaluation and Treatment Rehabilitation  THERAPY DIAG:  Cervicalgia  Other low back pain  Muscle weakness (generalized)  Other muscle spasm  ONSET DATE: 2023  SUBJECTIVE:                                                                                                                                                                                           SUBJECTIVE STATEMENT: Pt reports she has been waking with more numbness and tingling in her hands lately.  She requests to do more new exercises to strengthen prior to upcoming surgery.      PERTINENT HISTORY:   February 07 2022 surgery- planned to be sooner; hysterectomy  3 abdominal surgeries, one for miscarriage in 2005, 2016 was cyst removal and salpingectomy Rt, Lt salpingectomy 2019 and some endometriosis.  PAIN:  Are you having pain? Yes:  NPRS scale: 10/10 Pain location: C/S and L/S and pelvis; R hip Pain description:  sharp; dull; shooting Aggravating factors: standing, sitting, cooking, cleaning, groceries, stairs Relieving factors: resting, sitting   PRECAUTIONS: None  WEIGHT BEARING RESTRICTIONS No  FALLS:  Has patient fallen in last 6 months? No  LIVING ENVIRONMENT: Lives with: lives with  their family Lives in: House/apartment Stairs: Yes Has following equipment at home: Single point cane  OCCUPATION: on leave from work at the moment; Producer, television/film/video; climbing ladders     PLOF: Independent with basic ADLs  PATIENT GOALS :    OBJECTIVE: (findings taken at evaluation unless otherwise noted)  DIAGNOSTIC FINDINGS:  IMPRESSION: No acute fracture or dislocation. Minimal degenerative joint changes of lower thoracic spine.     Lumbar X-ray IMPRESSION: Negative.  IMPRESSION: No acute fracture or dislocation. Mild degenerative joint changes of cervical spine.  PATIENT SURVEYS:  Modified Oswestry    NDI:   SCREENING FOR RED FLAGS: Bowel or bladder incontinence: Yes: history of urge incontinence Spinal tumors: No Cauda equina syndrome: No Compression fracture: No   COGNITION:  Overall cognitive status: Within functional limits for tasks assessed     SENSATION: Light touch: WFL  POSTURE: rounded shoulders, decreased lumbar lordosis, and anterior pelvic tilt  LUMBAR ROM:  all lumbar ROM exacerbates  pelvic discomfort and pressure; held further ROM screening of lumbar and LE due to pelvic pain  Cervical ROM:   Active  A/PROM  eval  Flexion WFL  Extension WFL  Right lateral flexion 75% HA sensation  Left lateral flexion 75% HA sensation  Right rotation 75%  Left rotation 75%   (Blank rows = not tested)  LOWER EXTREMITY MMT:    Active  Right eval Left eval  Hip flexion 4-/5 p! 4/5  Hip extension    Hip abduction 4-/5 p! 4-/5 p!  Hip adduction 4-/5 p! 4-/5 p!   (Blank rows = not tested)  Grip Strength:  R 15lbs; 15,15;  L 15lbs, 20, 20    FUNCTIONAL TESTS:  STS transfers: requires single UE for standing  GAIT: Distance walked: 73f Assistive device utilized: None Level of assistance: Complete Independence Comments: toe out gait, decreased hip extension, hip hiking, decreased step length    TODAY'S TREATMENT  Pt seen for aquatic therapy today.  Treatment took place in water 3.25-4.5 ft in [place] at the [place] Temp of water was 92.  Pt entered/exited the pool via stairs independently with bilat rail.   * unsupported walking forward, backward with row motion, UE on yellow noodle * Press downs at side with rainbow hand floats x 10 * walking with rainbow -> yellow hand floats at sides  * ab set with blue short noodle pull down to thighs  * squats pushing rainbow hand float under water * monster walk forward with coordinating arms * suspended by noodle between LEs holding corner: cycling, hip abdc/add, cc ski - 2 long rounds *  Plank on bench with hip ext x10 R/L. Cues for core stailization * STS from bench with feet on blue step with cues for neutral head position x 4 (spasmed, stopped) * returned to walking with row motion on noodle and cycling while suspended in corner  Pt requires the buoyancy and hydrostatic pressure of water for support, and to offload joints by unweighting joint load by at least 50 % in navel deep water and by at least 75-80% in chest to neck deep water.  Viscosity of the water is needed for resistance of strengthening. Water current perturbations provides challenge to standing balance requiring increased core activation.     PATIENT EDUCATION:  Education details: review of HEP, options for wetsuits on a[place]: Patient Education method: Explanation, Demonstration, TCorporate treasurercues, Verbal cues Education comprehension: verbalized understanding, returned demonstration, verbal cues required, and tactile cues  required   HOME EXERCISE PROGRAM: Access Code:  [email or web address] URL: [email or web address] Date: 10/31/2021 Prepared by: Daleen Bo  Aquatic:  Access Code: [phone] URL: [email or web address] Date: 11/17/2021 Prepared by: [place]  Exercises - Forward Walking  - 1 x daily - 7 x weekly - 3 sets - Side Stepping  - 1 x daily - 7 x weekly - 3 sets - Standing Row with Anchored Resistance  - 1 x daily - 7 x weekly - 1 sets - 10 reps - Standing 3-way leg kick holding noodle  - 1 x daily - 7 x weekly - 1 sets - 10 reps - Single Leg Stance Clamshell  - 1 x daily - 7 x weekly - 1 sets - 10 reps - Heel Toe Raises at [place]  - 1 x daily - 7 x weekly - 1 sets - 10 reps - Seated Straddle on Flotation Forward Breast Stroke Arms and Bicycle Legs  - 1 x daily - 7 x weekly - 3 sets - 10 reps - [place] Ski with Foam Dumbbells and Ankle Floats  - 1 x daily - 7 x weekly - 3 sets - 10 reps  ASSESSMENT:  CLINICAL IMPRESSION: She reported reduction of overall pain to 5/10 until sit to/from stand with back and abdomen began to spasm. Otherwise, exercises were tolerated well.  Pt is progressing gradually towards goals.  Pt would benefit from continued skilled therapy in order to reach goals and maximize function for prevention of further functional decline.    OBJECTIVE IMPAIRMENTS Abnormal gait, decreased activity tolerance, decreased balance, decreased coordination, decreased knowledge of condition, decreased mobility, difficulty walking, decreased ROM, decreased strength, hypomobility, increased muscle spasms, impaired flexibility, improper body mechanics, postural dysfunction, and pain.   ACTIVITY LIMITATIONS carrying, lifting, bending, sitting, squatting, sleeping, stairs, transfers, and locomotion level  PARTICIPATION LIMITATIONS: cleaning, interpersonal relationship, driving, shopping, community activity, occupation, and exercise  PERSONAL FACTORS Age, Behavior pattern,  Fitness, Time since onset of injury/illness/exacerbation, social, and 3+ comorbidities:  are also affecting patient's functional outcome.   REHAB POTENTIAL: Fair    CLINICAL DECISION MAKING: Evolving/moderate complexity  EVALUATION COMPLEXITY: Moderate   GOALS:   SHORT TERM GOALS: Target date: 12/12/2021  Pt will become independent with HEP in order to demonstrate synthesis of PT education. Goal status: INITIAL  2.  Pt will report at least 2 pt reduction on NPRS scale for pain in order to demonstrate functional improvement with household activity, self care, and ADL.   Goal status: INITIAL  3.  Pt will demonstrate at least a 12.8 improvement in Modified Oswestry Index in order to demonstrate a clinically significant change in LBP and function.   Goal status: INITIAL  4. Pt will demonstrate at least a 7.5 improvement in Neck Disability Index in order to demonstrate a clinically significant change in neck pain and function.   Goal status: INITIAL    LONG TERM GOALS: Target date: 01/23/2022  Pt  will become independent with final HEP in order to demonstrate synthesis of PT education.  Goal status: INITIAL  2.  Pt will be able to perform 5XSTS in under 12s  in order to demonstrate functional improvement above the cut off score for adults.   Goal status: INITIAL  3.  Pt will be able to demonstrate/report ability to walk >10 mins with <3/10 pain pain in order to demonstrate functional improvement and tolerance to exercise and community mobility.   Goal status: INITIAL  4.  Pt will be able to lift//hold >15lbs in order to demonstrate functional improvement in lumbopelvic strength for return to ADL and exercise.   Goal status: INITIAL  5.  Pelvic floor goals to be set at time of (re)evaluation  Goal status: INITIAL   PLAN: PT FREQUENCY: 1-2x/week  PT DURATION: 12 weeks (likely D/C in 8 wks)  PLANNED INTERVENTIONS: Therapeutic exercises, Therapeutic activity,  Neuromuscular re-education, Balance training, Gait training, Patient/Family education, Self Care, Joint mobilization, Joint manipulation, Stair training, Orthotic/Fit training, DME instructions, Aquatic Therapy, Dry Needling, Electrical stimulation, Spinal manipulation, Spinal mobilization, Cryotherapy, Moist heat, scar mobilization, Taping, Vasopneumatic device, Traction, Ultrasound, Biofeedback, Ionotophoresis '4mg'$ /ml Dexamethasone, Manual therapy, and Re-evaluation.  PLAN FOR NEXT SESSION:  aquatic therapy: gentle UE motion, light hip and lumbar mobility; chronic pain relief  Kerin Perna, PTA 11/29/21 3:29 PM [facility] [address] [place], [place], [address] Phone: [phone]   Fax:  [phone]

## 2021-11-29 NOTE — Telephone Encounter (Signed)
Sherron Monday, NP  Issabelle Mcraney, [place], CMA; [place], Tammy L, LPN I may have to left this off of the encounter form.   Patient needs TCS and EGD.  For her colon prep she will need to prep with Clenpiq, Suprep, or Sutab.   Not sure on the ability to perform procedures given her current financial status.  She has been awaiting Medicaid to go through.  If procedures are scheduled, she may benefit from receiving Clenpiq samples if we have any.  Given her constipation she will need a full 2 days clear liquid diet prior to her procedures.  I have provided her with some samples of Linzess to try, if this is helpful and she does not have any other reactions, she will need Linzess samples to use daily for 4 days prior to procedure if she is not able to obtain her prescription prior to her procedures.

## 2021-11-29 NOTE — Telephone Encounter (Signed)
Spoke with pt. She wanted to proceed with scheduling. Reports has financial assistance currently. Patient scheduled for 11/10 at 1:15pm. Aware will leave clenpiq sample with instructions/pre-op appt at front desk for pick up. Also will leave the linzess for pick up.

## 2021-11-29 NOTE — Telephone Encounter (Signed)
Message sent to endo to check on this

## 2021-11-29 NOTE — Telephone Encounter (Signed)
Lmovm to call back 

## 2021-12-01 ENCOUNTER — Encounter (HOSPITAL_BASED_OUTPATIENT_CLINIC_OR_DEPARTMENT_OTHER): Payer: Self-pay | Admitting: Physical Therapy

## 2021-12-01 ENCOUNTER — Ambulatory Visit (HOSPITAL_BASED_OUTPATIENT_CLINIC_OR_DEPARTMENT_OTHER): Payer: Medicaid Other | Admitting: Physical Therapy

## 2021-12-01 DIAGNOSIS — M5459 Other low back pain: Secondary | ICD-10-CM | POA: Diagnosis not present

## 2021-12-01 DIAGNOSIS — M62838 Other muscle spasm: Secondary | ICD-10-CM | POA: Diagnosis not present

## 2021-12-01 DIAGNOSIS — M6281 Muscle weakness (generalized): Secondary | ICD-10-CM

## 2021-12-01 DIAGNOSIS — M542 Cervicalgia: Secondary | ICD-10-CM

## 2021-12-01 NOTE — Therapy (Signed)
OUTPATIENT PHYSICAL THERAPY THORACOLUMBAR TREATMENT   Patient Name: Meghan Carter MRN: [id number] DOB:03-Aug-1980, 41 y.o., female Today's Date: 12/01/2021   PT End of Session - 12/01/21 1445     Visit Number 6    Number of Visits 18    Date for PT Re-Evaluation 01/29/22    Authorization Type [employer]    PT Start Time 1445    PT Stop Time 1525    PT Time Calculation (min) 40 min    Behavior During Therapy [facility] for tasks assessed/performed              Past Medical History:  Diagnosis Date   Acute blood loss anemia 03/26/2015   Anemia    Anxiety    Atherosclerosis of arteries    Blood transfusion without reported diagnosis 2017   2 units, no reaction   Callus of foot 02/08/2011   Depression    Dysmenorrhea    Endometrial polyp 02/11/2014   Fecal occult blood test positive 01/27/2014   Fecal occult blood test positive 01/27/2014   Fibroid    High-tone pelvic floor dysfunction 03/17/2015   History of abnormal cervical Pap smear 02/11/2014   History of acute pancreatitis 03/31/2011   HSV (herpes simplex virus) infection    Hydrosalpinx    Bilateral   Internal hemorrhoids    Lichenification and lichen simplex chronicus 11/07/2010   Lower abdominal pain    Menorrhagia with regular cycle 01/27/2014   Menorrhagia with regular cycle 01/27/2014   Other fatigue    Pelvic pain in female 01/27/2014   Plantar fasciitis 02/08/2011   Rectal bleeding 01/27/2014   Rectal pain 03/19/2014   Severe anemia    Vaginal Pap smear, abnormal    Past Surgical History:  Procedure Laterality Date   CHROMOPERTUBATION  08/14/2017   Procedure: CHROMOPERTUBATION;  Surgeon: Governor Specking, MD;  Location: [facility];  Service: Gynecology;;   COLONOSCOPY N/A 03/23/2014   SLF: 1. The examined terminal ileum appeared to be normal. 2. The left colon is redundant 3. Moderate sized internal hemorrhoids   ECTOPIC PREGNANCY SURGERY Left 2005   FLEXIBLE SIGMOIDOSCOPY N/A 04/01/2015   SLF: rectal bleeding due to large  internal hemorrhoids 2. anemia due to rectal bleeding/memses/ poor iron intake.   FLEXIBLE SIGMOIDOSCOPY N/A 03/27/2015   Procedure: FLEXIBLE SIGMOIDOSCOPY;  Surgeon: Danie Binder, MD;  Location: [facility];  Service: Endoscopy;  Laterality: N/A;   HEMORRHOID BANDING N/A 03/23/2014   Procedure: HEMORRHOID BANDING;  Surgeon: Danie Binder, MD;  Location: [facility];  Service: Endoscopy;  Laterality: N/A;   HEMORRHOID BANDING  03/27/2015   Procedure: HEMORRHOID BANDING;  Surgeon: Danie Binder, MD;  Location: [facility];  Service: Endoscopy;;   HEMORRHOID SURGERY N/A 01/11/2018   Procedure: EXTENSIVE HEMORRHOIDECTOMY;  Surgeon: Aviva Signs, MD;  Location: [facility];  Service: General;  Laterality: N/A;   HYSTEROSCOPY WITH D & C N/A 06/16/2014   Procedure: DILATATION AND CURETTAGE /HYSTEROSCOPY;  Surgeon: Jonnie Kind, MD;  Location: [facility];  Service: Gynecology;  Laterality: N/A;   LAPAROSCOPIC UNILATERAL SALPINGECTOMY Right 06/16/2014   Procedure: LAPAROSCOPIC UNILATERAL SALPINGECTOMY;  Surgeon: Jonnie Kind, MD;  Location: [facility];  Service: Gynecology;  Laterality: Right;   LAPAROSCOPY N/A 08/14/2017   Procedure: OPERATIVE LAPAROSCOPY ,LEFT SALPINGECTOMY, LYSIS OF ADHESIONS;  Surgeon: Governor Specking, MD;  Location: [facility];  Service: Gynecology;  Laterality: N/A;   POLYPECTOMY N/A 06/16/2014   Procedure: POLYPECTOMY (endometrial);  Surgeon: Jonnie Kind, MD;  Location: [facility];  Service: Gynecology;  Laterality: N/A;   Patient Active Problem List   Diagnosis Date Noted   Lichen planus 12/75/1700   Spine pain, cervical 01/25/2021   Acute post-traumatic headache, not intractable 01/25/2021   Encounter for support and coordination of transition of care 01/25/2021   Leiomyoma 12/29/2020   Morbid obesity ([place]) 12/29/2020   Enlarged thyroid 06/29/2020   Constipation 06/29/2020   Annual physical exam 01/23/2019   Recurrent major depressive disorder, in partial remission ([place])  01/14/2019   Depression, major, single episode, moderate ([place]) 11/19/2018   Anxiety 11/19/2018   Herpes simplex type 2 infection 01/24/2018   Internal and external bleeding hemorrhoids    History of abnormal cervical Pap smear 02/11/2014   Hemorrhoids, internal 01/27/2014   Nondependent alcohol abuse, episodic drinking behavior 03/31/2011    PCP: [facility], Trenton Gammon, NP  REFERRING PROVIDER: Carole Civil, MD  Porfirio Mylar, FNP (pelvic referral)  REFERRING DIAG:  M50.30 (ICD-10-CM) - DDD (degenerative disc disease), cervical  M51.37 (ICD-10-CM) - DDD (degenerative disc disease), lumbosacral  M54.50,G89.29 (ICD-10-CM) - Chronic midline low back pain, unspecified whether sciatica present   R10.2 (ICD-10-CM) - Pelvic and perineal pain  G89.29 (ICD-10-CM) - Other chronic pain  M54.50 (ICD-10-CM) - Low back pain, unspecified  M25.551 (ICD-10-CM) - Pain in right hip  M25.552 (ICD-10-CM) - Pain in left hip   Rationale for Evaluation and Treatment Rehabilitation  THERAPY DIAG:  Cervicalgia  Other low back pain  Muscle weakness (generalized)  Other muscle spasm  ONSET DATE: 2023  SUBJECTIVE:                                                                                                                                                                                           SUBJECTIVE STATEMENT: Pt reports she was sore after last session, but she expected some soreness from exercise.   PERTINENT HISTORY:   February 07 2022 surgery- planned to be sooner; hysterectomy  3 abdominal surgeries, one for miscarriage in 2005, 2016 was cyst removal and salpingectomy Rt, Lt salpingectomy 2019 and some endometriosis.  PAIN:  Are you having pain? Yes:  NPRS scale: 8-9/10 Pain location: C/S and L/S and pelvis; R hip Pain description: sharp; dull; shooting Aggravating factors: standing, sitting, cooking, cleaning, groceries, stairs Relieving factors: resting,  sitting   PRECAUTIONS: None  WEIGHT BEARING RESTRICTIONS No  FALLS:  Has patient fallen in last 6 months? No  LIVING ENVIRONMENT: Lives with: lives with their family Lives in: House/apartment Stairs: Yes Has following equipment at home: Single point cane  OCCUPATION: on leave from work at the moment; Producer, television/film/video;  climbing ladders     PLOF: Independent with basic ADLs  PATIENT GOALS :    OBJECTIVE: (findings taken at evaluation unless otherwise noted)  DIAGNOSTIC FINDINGS:  IMPRESSION: No acute fracture or dislocation. Minimal degenerative joint changes of lower thoracic spine.     Lumbar X-ray IMPRESSION: Negative.  IMPRESSION: No acute fracture or dislocation. Mild degenerative joint changes of cervical spine.  PATIENT SURVEYS:  Modified Oswestry    NDI:   SCREENING FOR RED FLAGS: Bowel or bladder incontinence: Yes: history of urge incontinence Spinal tumors: No Cauda equina syndrome: No Compression fracture: No   COGNITION:  Overall cognitive status: Within functional limits for tasks assessed     SENSATION: Light touch: WFL  POSTURE: rounded shoulders, decreased lumbar lordosis, and anterior pelvic tilt  LUMBAR ROM:  all lumbar ROM exacerbates  pelvic discomfort and pressure; held further ROM screening of lumbar and LE due to pelvic pain  Cervical ROM:   Active  A/PROM  eval  Flexion WFL  Extension WFL  Right lateral flexion 75% HA sensation  Left lateral flexion 75% HA sensation  Right rotation 75%  Left rotation 75%   (Blank rows = not tested)  LOWER EXTREMITY MMT:    Active  Right eval Left eval  Hip flexion 4-/5 p! 4/5  Hip extension    Hip abduction 4-/5 p! 4-/5 p!  Hip adduction 4-/5 p! 4-/5 p!   (Blank rows = not tested)  Grip Strength:  R 15lbs; 15,15;  L 15lbs, 20, 20   FUNCTIONAL TESTS:  STS transfers: requires single UE for standing  GAIT: Distance walked: 28f Assistive device utilized:  None Level of assistance: Complete Independence Comments: toe out gait, decreased hip extension, hip hiking, decreased step length    TODAY'S TREATMENT  Pt seen for aquatic therapy today.  Treatment took place in water 3.25-4.5 ft in depth at the [facility] Temp of water was 92.  Pt entered/exited the pool via stairs independently with bilat rail.   * unsupported walking forward, backward with row motion, UE on yellow noodle, side stepping  * ab set with blue short noodle pull down to thighs  * Press downs at side with rainbow hand floats x 10 * walking with rainbow hand floats at sides  * squats pushing yellow hand float under water * wide stance with shoulder add/abdc with rainbow hand floats x 15 * return to walking with rainbow hand floats at side forward/backward * suspended by noodle between LEs holding corner: cycling, hip abdc/add, cc ski - 2 long rounds * forward kick with opp arm punches *  Plank on bench with hip ext . Cues for core stailization * staggered stance with kick board rows  * lumbar rotation with arms resting on kick board  * warrior 2 with 5 knee bends each direction, arms on top of water; modified triangle pose each side * returned to walking with row motion yellow noodle  Pt requires the buoyancy and hydrostatic pressure of water for support, and to offload joints by unweighting joint load by at least 50 % in navel deep water and by at least 75-80% in chest to neck deep water.  Viscosity of the water is needed for resistance of strengthening. Water current perturbations provides challenge to standing balance requiring increased core activation.     PATIENT EDUCATION:  Education details: aquatic progressions  Person educated: Patient Education method: Explanation, Demonstration, TCorporate treasurercues, Verbal cues Education comprehension: verbalized understanding, returned demonstration, verbal cues required,  and tactile cues required   HOME  EXERCISE PROGRAM: Access Code: [email or web address] URL: [email or web address] Date: 10/31/2021 Prepared by: Daleen Bo  Aquatic:  Access Code: [phone] URL: [email or web address] Date: 11/17/2021 Prepared by: [place]  Exercises - Forward Walking  - 1 x daily - 7 x weekly - 3 sets - Side Stepping  - 1 x daily - 7 x weekly - 3 sets - Standing Row with Anchored Resistance  - 1 x daily - 7 x weekly - 1 sets - 10 reps - Standing 3-way leg kick holding noodle  - 1 x daily - 7 x weekly - 1 sets - 10 reps - Single Leg Stance Clamshell  - 1 x daily - 7 x weekly - 1 sets - 10 reps - Heel Toe Raises at [place]  - 1 x daily - 7 x weekly - 1 sets - 10 reps - Seated Straddle on Flotation Forward Breast Stroke Arms and Bicycle Legs  - 1 x daily - 7 x weekly - 3 sets - 10 reps - [place] Ski with Foam Dumbbells and Ankle Floats  - 1 x daily - 7 x weekly - 3 sets - 10 reps  ASSESSMENT:  CLINICAL IMPRESSION: She reported good tolerance to exercises today.  Overall pain reduction reported during session.   Pt is progressing gradually towards goals.  Pt would benefit from continued skilled therapy in order to reach goals and maximize function for prevention of further functional decline.    OBJECTIVE IMPAIRMENTS Abnormal gait, decreased activity tolerance, decreased balance, decreased coordination, decreased knowledge of condition, decreased mobility, difficulty walking, decreased ROM, decreased strength, hypomobility, increased muscle spasms, impaired flexibility, improper body mechanics, postural dysfunction, and pain.   ACTIVITY LIMITATIONS carrying, lifting, bending, sitting, squatting, sleeping, stairs, transfers, and locomotion level  PARTICIPATION LIMITATIONS: cleaning, interpersonal relationship, driving, shopping, community activity, occupation, and exercise  PERSONAL FACTORS Age, Behavior pattern, Fitness, Time since onset  of injury/illness/exacerbation, social, and 3+ comorbidities:  are also affecting patient's functional outcome.   REHAB POTENTIAL: Fair    CLINICAL DECISION MAKING: Evolving/moderate complexity  EVALUATION COMPLEXITY: Moderate   GOALS:   SHORT TERM GOALS: Target date: 12/12/2021  Pt will become independent with HEP in order to demonstrate synthesis of PT education. Goal status: INITIAL  2.  Pt will report at least 2 pt reduction on NPRS scale for pain in order to demonstrate functional improvement with household activity, self care, and ADL.   Goal status: INITIAL  3.  Pt will demonstrate at least a 12.8 improvement in Modified Oswestry Index in order to demonstrate a clinically significant change in LBP and function.   Goal status: INITIAL  4. Pt will demonstrate at least a 7.5 improvement in Neck Disability Index in order to demonstrate a clinically significant change in neck pain and function.   Goal status: INITIAL    LONG TERM GOALS: Target date: 01/23/2022  Pt  will become independent with final HEP in order to demonstrate synthesis of PT education.  Goal status: INITIAL  2.  Pt will be able to perform 5XSTS in under 12s  in order to demonstrate functional improvement above the cut off score for adults.   Goal status: INITIAL  3.  Pt will be able to demonstrate/report ability to walk >10 mins with <3/10 pain pain in order to demonstrate functional improvement and tolerance to exercise and community mobility.   Goal status: INITIAL  4.  Pt will be able to  lift//hold >15lbs in order to demonstrate functional improvement in lumbopelvic strength for return to ADL and exercise.   Goal status: INITIAL  5.  Pelvic floor goals to be set at time of (re)evaluation  Goal status: INITIAL   PLAN: PT FREQUENCY: 1-2x/week  PT DURATION: 12 weeks (likely D/C in 8 wks)  PLANNED INTERVENTIONS: Therapeutic exercises, Therapeutic activity, Neuromuscular re-education,  Balance training, Gait training, Patient/Family education, Self Care, Joint mobilization, Joint manipulation, Stair training, Orthotic/Fit training, DME instructions, Aquatic Therapy, Dry Needling, Electrical stimulation, Spinal manipulation, Spinal mobilization, Cryotherapy, Moist heat, scar mobilization, Taping, Vasopneumatic device, Traction, Ultrasound, Biofeedback, Ionotophoresis '4mg'$ /ml Dexamethasone, Manual therapy, and Re-evaluation.  PLAN FOR NEXT SESSION:  aquatic therapy: gentle UE motion, light hip and lumbar mobility; chronic pain relief  Kerin Perna, PTA 12/01/21 3:26 PM [facility] [address] [place], [place], [address] Phone: [phone]   Fax:  [phone]

## 2021-12-06 ENCOUNTER — Ambulatory Visit (HOSPITAL_BASED_OUTPATIENT_CLINIC_OR_DEPARTMENT_OTHER): Payer: Medicaid Other | Admitting: Physical Therapy

## 2021-12-06 ENCOUNTER — Encounter (HOSPITAL_BASED_OUTPATIENT_CLINIC_OR_DEPARTMENT_OTHER): Payer: Self-pay | Admitting: Physical Therapy

## 2021-12-06 ENCOUNTER — Ambulatory Visit: Payer: Self-pay | Admitting: Nurse Practitioner

## 2021-12-06 DIAGNOSIS — M5459 Other low back pain: Secondary | ICD-10-CM

## 2021-12-06 DIAGNOSIS — M62838 Other muscle spasm: Secondary | ICD-10-CM | POA: Diagnosis not present

## 2021-12-06 DIAGNOSIS — M6281 Muscle weakness (generalized): Secondary | ICD-10-CM | POA: Diagnosis not present

## 2021-12-06 DIAGNOSIS — M542 Cervicalgia: Secondary | ICD-10-CM | POA: Diagnosis not present

## 2021-12-06 NOTE — Therapy (Signed)
OUTPATIENT PHYSICAL THERAPY THORACOLUMBAR TREATMENT   Patient Name: Meghan Carter MRN: [id number] DOB:[birth year], 41 y.o., female Today's Date: 12/06/2021   PT End of Session - 12/06/21 1454     Visit Number 7    Number of Visits 18    Date for PT Re-Evaluation 01/29/22    Authorization Type [employer]    PT Start Time 1446    PT Stop Time 7829    PT Time Calculation (min) 44 min    Activity Tolerance Patient tolerated treatment well    Behavior During Therapy [facility] for tasks assessed/performed               Past Medical History:  Diagnosis Date   Acute blood loss anemia 03/26/2015   Anemia    Anxiety    Atherosclerosis of arteries    Blood transfusion without reported diagnosis 2017   2 units, no reaction   Callus of foot 02/08/2011   Depression    Dysmenorrhea    Endometrial polyp 02/11/2014   Fecal occult blood test positive 01/27/2014   Fecal occult blood test positive 01/27/2014   Fibroid    High-tone pelvic floor dysfunction 03/17/2015   History of abnormal cervical Pap smear 02/11/2014   History of acute pancreatitis 03/31/2011   HSV (herpes simplex virus) infection    Hydrosalpinx    Bilateral   Internal hemorrhoids    Lichenification and lichen simplex chronicus 11/07/2010   Lower abdominal pain    Menorrhagia with regular cycle 01/27/2014   Menorrhagia with regular cycle 01/27/2014   Other fatigue    Pelvic pain in female 01/27/2014   Plantar fasciitis 02/08/2011   Rectal bleeding 01/27/2014   Rectal pain 03/19/2014   Severe anemia    Vaginal Pap smear, abnormal    Past Surgical History:  Procedure Laterality Date   CHROMOPERTUBATION  08/14/2017   Procedure: CHROMOPERTUBATION;  Surgeon: Governor Specking, MD;  Location: [facility];  Service: Gynecology;;   COLONOSCOPY N/A 03/23/2014   SLF: 1. The examined terminal ileum appeared to be normal. 2. The left colon is redundant 3. Moderate sized internal hemorrhoids   ECTOPIC PREGNANCY SURGERY Left 2005   FLEXIBLE  SIGMOIDOSCOPY N/A 04/01/2015   SLF: rectal bleeding due to large internal hemorrhoids 2. anemia due to rectal bleeding/memses/ poor iron intake.   FLEXIBLE SIGMOIDOSCOPY N/A 03/27/2015   Procedure: FLEXIBLE SIGMOIDOSCOPY;  Surgeon: Danie Binder, MD;  Location: [facility];  Service: Endoscopy;  Laterality: N/A;   HEMORRHOID BANDING N/A 03/23/2014   Procedure: HEMORRHOID BANDING;  Surgeon: Danie Binder, MD;  Location: [facility];  Service: Endoscopy;  Laterality: N/A;   HEMORRHOID BANDING  03/27/2015   Procedure: HEMORRHOID BANDING;  Surgeon: Danie Binder, MD;  Location: [facility];  Service: Endoscopy;;   HEMORRHOID SURGERY N/A 01/11/2018   Procedure: EXTENSIVE HEMORRHOIDECTOMY;  Surgeon: Aviva Signs, MD;  Location: [facility];  Service: General;  Laterality: N/A;   HYSTEROSCOPY WITH D & C N/A 06/16/2014   Procedure: DILATATION AND CURETTAGE /HYSTEROSCOPY;  Surgeon: Jonnie Kind, MD;  Location: [facility];  Service: Gynecology;  Laterality: N/A;   LAPAROSCOPIC UNILATERAL SALPINGECTOMY Right 06/16/2014   Procedure: LAPAROSCOPIC UNILATERAL SALPINGECTOMY;  Surgeon: Jonnie Kind, MD;  Location: [facility];  Service: Gynecology;  Laterality: Right;   LAPAROSCOPY N/A 08/14/2017   Procedure: OPERATIVE LAPAROSCOPY ,LEFT SALPINGECTOMY, LYSIS OF ADHESIONS;  Surgeon: Governor Specking, MD;  Location: [facility];  Service: Gynecology;  Laterality: N/A;   POLYPECTOMY N/A 06/16/2014  Procedure: POLYPECTOMY (endometrial);  Surgeon: Jonnie Kind, MD;  Location: [facility];  Service: Gynecology;  Laterality: N/A;   Patient Active Problem List   Diagnosis Date Noted   Lichen planus 08/67/6195   Spine pain, cervical 01/25/2021   Acute post-traumatic headache, not intractable 01/25/2021   Encounter for support and coordination of transition of care 01/25/2021   Leiomyoma 12/29/2020   Morbid obesity ([place]) 12/29/2020   Enlarged thyroid 06/29/2020   Constipation 06/29/2020   Annual physical exam 01/23/2019    Recurrent major depressive disorder, in partial remission ([place]) 01/14/2019   Depression, major, single episode, moderate ([place]) 11/19/2018   Anxiety 11/19/2018   Herpes simplex type 2 infection 01/24/2018   Internal and external bleeding hemorrhoids    History of abnormal cervical Pap smear 02/11/2014   Hemorrhoids, internal 01/27/2014   Nondependent alcohol abuse, episodic drinking behavior 03/31/2011    PCP: [facility], Trenton Gammon, NP  REFERRING PROVIDER: Carole Civil, MD  Porfirio Mylar, FNP (pelvic referral)  REFERRING DIAG:  M50.30 (ICD-10-CM) - DDD (degenerative disc disease), cervical  M51.37 (ICD-10-CM) - DDD (degenerative disc disease), lumbosacral  M54.50,G89.29 (ICD-10-CM) - Chronic midline low back pain, unspecified whether sciatica present   R10.2 (ICD-10-CM) - Pelvic and perineal pain  G89.29 (ICD-10-CM) - Other chronic pain  M54.50 (ICD-10-CM) - Low back pain, unspecified  M25.551 (ICD-10-CM) - Pain in right hip  M25.552 (ICD-10-CM) - Pain in left hip   Rationale for Evaluation and Treatment Rehabilitation  THERAPY DIAG:  Cervicalgia  Other low back pain  Muscle weakness (generalized)  Other muscle spasm  ONSET DATE: 2023  SUBJECTIVE:                                                                                                                                                                                           SUBJECTIVE STATEMENT: Right lb pain 10/10 today.  Had some discomfort after last session but overall felt good. Have had bad constipation  PERTINENT HISTORY:   February 07 2022 surgery- planned to be sooner; hysterectomy  3 abdominal surgeries, one for miscarriage in 2005, 2016 was cyst removal and salpingectomy Rt, Lt salpingectomy 2019 and some endometriosis.  PAIN:  Are you having pain? Yes:  NPRS scale: 10/10 Pain location: C/S and L/S and pelvis; R hip Pain description: sharp; dull; shooting Aggravating factors: standing,  sitting, cooking, cleaning, groceries, stairs Relieving factors: resting, sitting   PRECAUTIONS: None  WEIGHT BEARING RESTRICTIONS No  FALLS:  Has patient fallen in last 6 months? No  LIVING ENVIRONMENT: Lives with: lives with their family Lives in: House/apartment Stairs: Yes Has following equipment at home:  Single point cane  OCCUPATION: on leave from work at the moment; Producer, television/film/video; climbing ladders     PLOF: Independent with basic ADLs  PATIENT GOALS :    OBJECTIVE: (findings taken at evaluation unless otherwise noted)  DIAGNOSTIC FINDINGS:  IMPRESSION: No acute fracture or dislocation. Minimal degenerative joint changes of lower thoracic spine.     Lumbar X-ray IMPRESSION: Negative.  IMPRESSION: No acute fracture or dislocation. Mild degenerative joint changes of cervical spine.  PATIENT SURVEYS:  Modified Oswestry    NDI:   SCREENING FOR RED FLAGS: Bowel or bladder incontinence: Yes: history of urge incontinence Spinal tumors: No Cauda equina syndrome: No Compression fracture: No   COGNITION:  Overall cognitive status: Within functional limits for tasks assessed     SENSATION: Light touch: WFL  POSTURE: rounded shoulders, decreased lumbar lordosis, and anterior pelvic tilt  LUMBAR ROM:  all lumbar ROM exacerbates  pelvic discomfort and pressure; held further ROM screening of lumbar and LE due to pelvic pain  Cervical ROM:   Active  A/PROM  eval  Flexion WFL  Extension WFL  Right lateral flexion 75% HA sensation  Left lateral flexion 75% HA sensation  Right rotation 75%  Left rotation 75%   (Blank rows = not tested)  LOWER EXTREMITY MMT:    Active  Right eval Left eval  Hip flexion 4-/5 p! 4/5  Hip extension    Hip abduction 4-/5 p! 4-/5 p!  Hip adduction 4-/5 p! 4-/5 p!   (Blank rows = not tested)  Grip Strength:  R 15lbs; 15,15;  L 15lbs, 20, 20   FUNCTIONAL TESTS:  STS transfers: requires single  UE for standing  GAIT: Distance walked: 110f Assistive device utilized: None Level of assistance: Complete Independence Comments: toe out gait, decreased hip extension, hip hiking, decreased step length    TODAY'S TREATMENT  Pt seen for aquatic therapy today.  Treatment took place in water 3.25-4.5 ft [place] [facility] Temp of water was 92.  Pt entered/exited the pool via stairs independently with bilat rail.   * unsupported walking forward, backward with row motion, UE on yellow noodle, side stepping  * ab set with blue short noodle pull down to thighs  * Press downs at side with rainbow hand floats x 10 * wide stance with shoulder  horizontal add/abdc with rainbow hand floats x 15 * walking with rainbow hand floats at sides  * forward and backward walking with rainbow hand floats at side forward/backward * staggered stance with kick board rows *  Plank on bench with hip ext . Cues for core stailization *L stretch x 3 * lumbar rotation using sm square noodle * suspended by noodle between LEs holding corner: cycling, hip abdc/add, cc ski - 2 long rounds   Pt requires the buoyancy and hydrostatic pressure of water for support, and to offload joints by unweighting joint load by at least 50 % in navel deep water and by at least 75-80% in chest to neck deep water.  Viscosity of the water is needed for resistance of strengthening. Water current perturbations provides challenge to standing balance requiring increased core activation.     PATIENT EDUCATION:  Education details: aquatic progressions  Person educated: Patient Education method: Explanation, Demonstration, TCorporate treasurercues, Verbal cues Education comprehension: verbalized understanding, returned demonstration, verbal cues required, and tactile cues required   HOME EXERCISE PROGRAM: Access Code: [email or web address]: [email or web address] Date: 10/31/2021 Prepared by: ADaleen Bo Aquatic:   Access  Code: [email or web address] URL: [email or web address] Date: 11/17/2021 Prepared by: De Beque  Exercises - Forward Walking  - 1 x daily - 7 x weekly - 3 sets - Side Stepping  - 1 x daily - 7 x weekly - 3 sets - Standing Row with Anchored Resistance  - 1 x daily - 7 x weekly - 1 sets - 10 reps - Standing 3-way leg kick holding noodle  - 1 x daily - 7 x weekly - 1 sets - 10 reps - Single Leg Stance Clamshell  - 1 x daily - 7 x weekly - 1 sets - 10 reps - Heel Toe Raises at [place]  - 1 x daily - 7 x weekly - 1 sets - 10 reps - Seated Straddle on Flotation Forward Breast Stroke Arms and Bicycle Legs  - 1 x daily - 7 x weekly - 3 sets - 10 reps - [place] Ski with Foam Dumbbells and Ankle Floats  - 1 x daily - 7 x weekly - 3 sets - 10 reps  ASSESSMENT:  CLINICAL IMPRESSION: Pt upset reports constipation without relief. She is directed through session with focus on strengthening and stretching of spine.  She tolerates very well. Pt edu on use of an enema for constipation relief. She VU. She is progressing well towards goals    OBJECTIVE IMPAIRMENTS Abnormal gait, decreased activity tolerance, decreased balance, decreased coordination, decreased knowledge of condition, decreased mobility, difficulty walking, decreased ROM, decreased strength, hypomobility, increased muscle spasms, impaired flexibility, improper body mechanics, postural dysfunction, and pain.   ACTIVITY LIMITATIONS carrying, lifting, bending, sitting, squatting, sleeping, stairs, transfers, and locomotion level  PARTICIPATION LIMITATIONS: cleaning, interpersonal relationship, driving, shopping, community activity, occupation, and exercise  PERSONAL FACTORS Age, Behavior pattern, Fitness, Time since onset of injury/illness/exacerbation, social, and 3+ comorbidities:  are also affecting patient's functional outcome.   REHAB POTENTIAL: Fair    CLINICAL DECISION  MAKING: Evolving/moderate complexity  EVALUATION COMPLEXITY: Moderate   GOALS:   SHORT TERM GOALS: Target date: 12/12/2021  Pt will become independent with HEP in order to demonstrate synthesis of PT education. Goal status: INITIAL  2.  Pt will report at least 2 pt reduction on NPRS scale for pain in order to demonstrate functional improvement with household activity, self care, and ADL.   Goal status: INITIAL  3.  Pt will demonstrate at least a 12.8 improvement in Modified Oswestry Index in order to demonstrate a clinically significant change in LBP and function.   Goal status: INITIAL  4. Pt will demonstrate at least a 7.5 improvement in Neck Disability Index in order to demonstrate a clinically significant change in neck pain and function.   Goal status: INITIAL    LONG TERM GOALS: Target date: 01/23/2022  Pt  will become independent with final HEP in order to demonstrate synthesis of PT education.  Goal status: INITIAL  2.  Pt will be able to perform 5XSTS in under 12s  in order to demonstrate functional improvement above the cut off score for adults.   Goal status: INITIAL  3.  Pt will be able to demonstrate/report ability to walk >10 mins with <3/10 pain pain in order to demonstrate functional improvement and tolerance to exercise and community mobility.   Goal status: INITIAL  4.  Pt will be able to lift//hold >15lbs in order to demonstrate functional improvement in lumbopelvic strength for return to ADL and exercise.   Goal status: INITIAL  5.  Pelvic floor  goals to be set at time of (re)evaluation  Goal status: INITIAL   PLAN: PT FREQUENCY: 1-2x/week  PT DURATION: 12 weeks (likely D/C in 8 wks)  PLANNED INTERVENTIONS: Therapeutic exercises, Therapeutic activity, Neuromuscular re-education, Balance training, Gait training, Patient/Family education, Self Care, Joint mobilization, Joint manipulation, Stair training, Orthotic/Fit training, DME instructions,  Aquatic Therapy, Dry Needling, Electrical stimulation, Spinal manipulation, Spinal mobilization, Cryotherapy, Moist heat, scar mobilization, Taping, Vasopneumatic device, Traction, Ultrasound, Biofeedback, Ionotophoresis '4mg'$ /ml Dexamethasone, Manual therapy, and Re-evaluation.  PLAN FOR NEXT SESSION:  aquatic therapy: gentle UE motion, light hip and lumbar mobility; chronic pain relief  Annamarie Major) Jaquay Posthumus MPT 12/06/21 3:35 PM [facility] [address] [place], [place], [address] Phone: [phone]   Fax:  [phone]

## 2021-12-08 ENCOUNTER — Encounter (HOSPITAL_BASED_OUTPATIENT_CLINIC_OR_DEPARTMENT_OTHER): Payer: Self-pay | Admitting: Physical Therapy

## 2021-12-08 ENCOUNTER — Ambulatory Visit (HOSPITAL_BASED_OUTPATIENT_CLINIC_OR_DEPARTMENT_OTHER): Payer: Medicaid Other | Attending: Podiatry | Admitting: Physical Therapy

## 2021-12-08 DIAGNOSIS — M5459 Other low back pain: Secondary | ICD-10-CM | POA: Diagnosis not present

## 2021-12-08 DIAGNOSIS — M542 Cervicalgia: Secondary | ICD-10-CM | POA: Diagnosis not present

## 2021-12-08 DIAGNOSIS — M62838 Other muscle spasm: Secondary | ICD-10-CM | POA: Insufficient documentation

## 2021-12-08 DIAGNOSIS — M6281 Muscle weakness (generalized): Secondary | ICD-10-CM | POA: Diagnosis not present

## 2021-12-08 NOTE — Therapy (Signed)
OUTPATIENT PHYSICAL THERAPY THORACOLUMBAR TREATMENT   Patient Name: Meghan Carter MRN: [id number] DOB:01-17-1981, 41 y.o., female Today's Date: 12/08/2021   PT End of Session - 12/08/21 1447     Visit Number 8    Number of Visits 18    Date for PT Re-Evaluation 01/29/22    Authorization Type [employer]    PT Start Time 1445    PT Stop Time 1525    PT Time Calculation (min) 40 min    Activity Tolerance Patient tolerated treatment well    Behavior During Therapy [facility] for tasks assessed/performed               Past Medical History:  Diagnosis Date   Acute blood loss anemia 03/26/2015   Anemia    Anxiety    Atherosclerosis of arteries    Blood transfusion without reported diagnosis 2017   2 units, no reaction   Callus of foot 02/08/2011   Depression    Dysmenorrhea    Endometrial polyp 02/11/2014   Fecal occult blood test positive 01/27/2014   Fecal occult blood test positive 01/27/2014   Fibroid    High-tone pelvic floor dysfunction 03/17/2015   History of abnormal cervical Pap smear 02/11/2014   History of acute pancreatitis 03/31/2011   HSV (herpes simplex virus) infection    Hydrosalpinx    Bilateral   Internal hemorrhoids    Lichenification and lichen simplex chronicus 11/07/2010   Lower abdominal pain    Menorrhagia with regular cycle 01/27/2014   Menorrhagia with regular cycle 01/27/2014   Other fatigue    Pelvic pain in female 01/27/2014   Plantar fasciitis 02/08/2011   Rectal bleeding 01/27/2014   Rectal pain 03/19/2014   Severe anemia    Vaginal Pap smear, abnormal    Past Surgical History:  Procedure Laterality Date   CHROMOPERTUBATION  08/14/2017   Procedure: CHROMOPERTUBATION;  Surgeon: Governor Specking, MD;  Location: [facility];  Service: Gynecology;;   COLONOSCOPY N/A 03/23/2014   SLF: 1. The examined terminal ileum appeared to be normal. 2. The left colon is redundant 3. Moderate sized internal hemorrhoids   ECTOPIC PREGNANCY SURGERY Left 2005   FLEXIBLE  SIGMOIDOSCOPY N/A 04/01/2015   SLF: rectal bleeding due to large internal hemorrhoids 2. anemia due to rectal bleeding/memses/ poor iron intake.   FLEXIBLE SIGMOIDOSCOPY N/A 03/27/2015   Procedure: FLEXIBLE SIGMOIDOSCOPY;  Surgeon: Danie Binder, MD;  Location: [facility];  Service: Endoscopy;  Laterality: N/A;   HEMORRHOID BANDING N/A 03/23/2014   Procedure: HEMORRHOID BANDING;  Surgeon: Danie Binder, MD;  Location: [facility];  Service: Endoscopy;  Laterality: N/A;   HEMORRHOID BANDING  03/27/2015   Procedure: HEMORRHOID BANDING;  Surgeon: Danie Binder, MD;  Location: [facility];  Service: Endoscopy;;   HEMORRHOID SURGERY N/A 01/11/2018   Procedure: EXTENSIVE HEMORRHOIDECTOMY;  Surgeon: Aviva Signs, MD;  Location: [facility];  Service: General;  Laterality: N/A;   HYSTEROSCOPY WITH D & C N/A 06/16/2014   Procedure: DILATATION AND CURETTAGE /HYSTEROSCOPY;  Surgeon: Jonnie Kind, MD;  Location: [facility];  Service: Gynecology;  Laterality: N/A;   LAPAROSCOPIC UNILATERAL SALPINGECTOMY Right 06/16/2014   Procedure: LAPAROSCOPIC UNILATERAL SALPINGECTOMY;  Surgeon: Jonnie Kind, MD;  Location: [facility];  Service: Gynecology;  Laterality: Right;   LAPAROSCOPY N/A 08/14/2017   Procedure: OPERATIVE LAPAROSCOPY ,LEFT SALPINGECTOMY, LYSIS OF ADHESIONS;  Surgeon: Governor Specking, MD;  Location: [facility];  Service: Gynecology;  Laterality: N/A;   POLYPECTOMY N/A 06/16/2014  Procedure: POLYPECTOMY (endometrial);  Surgeon: Jonnie Kind, MD;  Location: [facility];  Service: Gynecology;  Laterality: N/A;   Patient Active Problem List   Diagnosis Date Noted   Lichen planus 16/11/9602   Spine pain, cervical 01/25/2021   Acute post-traumatic headache, not intractable 01/25/2021   Encounter for support and coordination of transition of care 01/25/2021   Leiomyoma 12/29/2020   Morbid obesity ([place]) 12/29/2020   Enlarged thyroid 06/29/2020   Constipation 06/29/2020   Annual physical exam 01/23/2019    Recurrent major depressive disorder, in partial remission ([place]) 01/14/2019   Depression, major, single episode, moderate ([place]) 11/19/2018   Anxiety 11/19/2018   Herpes simplex type 2 infection 01/24/2018   Internal and external bleeding hemorrhoids    History of abnormal cervical Pap smear 02/11/2014   Hemorrhoids, internal 01/27/2014   Nondependent alcohol abuse, episodic drinking behavior 03/31/2011    PCP: [facility], Trenton Gammon, NP  REFERRING PROVIDER: Carole Civil, MD  Porfirio Mylar, FNP (pelvic referral)  REFERRING DIAG:  M50.30 (ICD-10-CM) - DDD (degenerative disc disease), cervical  M51.37 (ICD-10-CM) - DDD (degenerative disc disease), lumbosacral  M54.50,G89.29 (ICD-10-CM) - Chronic midline low back pain, unspecified whether sciatica present   R10.2 (ICD-10-CM) - Pelvic and perineal pain  G89.29 (ICD-10-CM) - Other chronic pain  M54.50 (ICD-10-CM) - Low back pain, unspecified  M25.551 (ICD-10-CM) - Pain in right hip  M25.552 (ICD-10-CM) - Pain in left hip   Rationale for Evaluation and Treatment Rehabilitation  THERAPY DIAG:  Cervicalgia  Other low back pain  Muscle weakness (generalized)  Other muscle spasm  ONSET DATE: 2023  SUBJECTIVE:                                                                                                                                                                                           SUBJECTIVE STATEMENT: Pt reports she used enima and had some success; plans to use another one prior to colonoscopy. (Scheduled for 11/10).    PERTINENT HISTORY:   February 07 2022 surgery- planned to be sooner; hysterectomy  3 abdominal surgeries, one for miscarriage in 2005, 2016 was cyst removal and salpingectomy Rt, Lt salpingectomy 2019 and some endometriosis.  PAIN:  Are you having pain? Yes:  NPRS scale: 7-8/10 Pain location:  L/S and pelvis; R hip Pain description: sharp; dull; shooting Aggravating factors: standing,  sitting, cooking, cleaning, groceries, stairs Relieving factors: resting, sitting   PRECAUTIONS: None  WEIGHT BEARING RESTRICTIONS No  FALLS:  Has patient fallen in last 6 months? No  LIVING ENVIRONMENT: Lives with: lives with their family Lives in: House/apartment Stairs: Yes Has following equipment at  home: Single point cane  OCCUPATION: on leave from work at the moment; Producer, television/film/video; climbing ladders     PLOF: Independent with basic ADLs  PATIENT GOALS :    OBJECTIVE: (findings taken at evaluation unless otherwise noted)  DIAGNOSTIC FINDINGS:  IMPRESSION: No acute fracture or dislocation. Minimal degenerative joint changes of lower thoracic spine.     Lumbar X-ray IMPRESSION: Negative.  IMPRESSION: No acute fracture or dislocation. Mild degenerative joint changes of cervical spine.  PATIENT SURVEYS:  Modified Oswestry    NDI:   SCREENING FOR RED FLAGS: Bowel or bladder incontinence: Yes: history of urge incontinence Spinal tumors: No Cauda equina syndrome: No Compression fracture: No   COGNITION:  Overall cognitive status: Within functional limits for tasks assessed     SENSATION: Light touch: WFL  POSTURE: rounded shoulders, decreased lumbar lordosis, and anterior pelvic tilt  LUMBAR ROM:  all lumbar ROM exacerbates  pelvic discomfort and pressure; held further ROM screening of lumbar and LE due to pelvic pain  Cervical ROM:   Active  A/PROM  eval  Flexion WFL  Extension WFL  Right lateral flexion 75% HA sensation  Left lateral flexion 75% HA sensation  Right rotation 75%  Left rotation 75%   (Blank rows = not tested)  LOWER EXTREMITY MMT:    Active  Right eval Left eval  Hip flexion 4-/5 p! 4/5  Hip extension    Hip abduction 4-/5 p! 4-/5 p!  Hip adduction 4-/5 p! 4-/5 p!   (Blank rows = not tested)  Grip Strength:  R 15lbs; 15,15;  L 15lbs, 20, 20   FUNCTIONAL TESTS:  STS transfers: requires single  UE for standing  GAIT: Distance walked: 47f Assistive device utilized: None Level of assistance: Complete Independence Comments: toe out gait, decreased hip extension, hip hiking, decreased step length    TODAY'S TREATMENT  Pt seen for aquatic therapy today.  Treatment took place in water 3.25-4.5 ft [place] Temp of water was 92.  Pt entered/exited the pool via stairs independently with bilat rail.   * unsupported walking forward, backward * staggered stance with kick board row x 10 each side * hands flat on board with trunk rotation for stretch, then board upright with resisted rotation (short range)  * ab set with thin square noodle pull downs to thighs x 15 * return to forward/ backwards walking with row motions, UE on yellow noodle * Press downs at side with rainbow -> yellow hand floats x 10 * Plank with on bench, hip ext x 10, cues to not over ext lumbar and maintain neutral head/spine * side stepping with shoulder addct with rainbow hand floats x 2 widths * walking and marching forward/backward with yellow hand floats at side * holding yellow noodle:  single leg stance with clam x 8 each LE; side lunge and return to neutral x 8 each (challenge) * suspended by noodle between LEs holding corner: cycling, hip abdc/add, cc ski - 2 long rounds  Pt requires the buoyancy and hydrostatic pressure of water for support, and to offload joints by unweighting joint load by at least 50 % in navel deep water and by at least 75-80% in chest to neck deep water.  Viscosity of the water is needed for resistance of strengthening. Water current perturbations provides challenge to standing balance requiring increased core activation.     PATIENT EDUCATION:  Education details: aquatic progressions  Person educated: Patient Education method: Explanation, DMedia planner TCorporate treasurer  cues, Verbal cues Education comprehension: verbalized understanding, returned  demonstration, verbal cues required, and tactile cues required   HOME EXERCISE PROGRAM: Access Code: [email or web address] URL: [email or web address] Date: 10/31/2021 Prepared by: Daleen Bo  Aquatic:  Access Code: [email or web address] URL: [email or web address] Date: 11/17/2021 Prepared by: [place]  Exercises - Forward Walking  - 1 x daily - 7 x weekly - 3 sets - Side Stepping  - 1 x daily - 7 x weekly - 3 sets - Standing Row with Anchored Resistance  - 1 x daily - 7 x weekly - 1 sets - 10 reps - Standing 3-way leg kick holding noodle  - 1 x daily - 7 x weekly - 1 sets - 10 reps - Single Leg Stance Clamshell  - 1 x daily - 7 x weekly - 1 sets - 10 reps - Heel Toe Raises at [place]  - 1 x daily - 7 x weekly - 1 sets - 10 reps - Seated Straddle on Flotation Forward Breast Stroke Arms and Bicycle Legs  - 1 x daily - 7 x weekly - 3 sets - 10 reps - [place] Ski with Foam Dumbbells and Ankle Floats  - 1 x daily - 7 x weekly - 3 sets - 10 reps  ASSESSMENT:  CLINICAL IMPRESSION:. Pt reporting her constipation is improving, thus also improving some of her back pain. She is tolerating increased resistance with aquatic exercises without increase in pain. She remains motivated to progress towards established PT goals.  She is progressing well towards goals    OBJECTIVE IMPAIRMENTS Abnormal gait, decreased activity tolerance, decreased balance, decreased coordination, decreased knowledge of condition, decreased mobility, difficulty walking, decreased ROM, decreased strength, hypomobility, increased muscle spasms, impaired flexibility, improper body mechanics, postural dysfunction, and pain.   ACTIVITY LIMITATIONS carrying, lifting, bending, sitting, squatting, sleeping, stairs, transfers, and locomotion level  PARTICIPATION LIMITATIONS: cleaning, interpersonal relationship, driving, shopping, community activity, occupation, and  exercise  PERSONAL FACTORS Age, Behavior pattern, Fitness, Time since onset of injury/illness/exacerbation, social, and 3+ comorbidities:  are also affecting patient's functional outcome.   REHAB POTENTIAL: Fair    CLINICAL DECISION MAKING: Evolving/moderate complexity  EVALUATION COMPLEXITY: Moderate   GOALS:   SHORT TERM GOALS: Target date: 12/12/2021  Pt will become independent with HEP in order to demonstrate synthesis of PT education. Goal status: INITIAL  2.  Pt will report at least 2 pt reduction on NPRS scale for pain in order to demonstrate functional improvement with household activity, self care, and ADL.   Goal status: INITIAL  3.  Pt will demonstrate at least a 12.8 improvement in Modified Oswestry Index in order to demonstrate a clinically significant change in LBP and function.   Goal status: INITIAL  4. Pt will demonstrate at least a 7.5 improvement in Neck Disability Index in order to demonstrate a clinically significant change in neck pain and function.   Goal status: INITIAL    LONG TERM GOALS: Target date: 01/23/2022  Pt  will become independent with final HEP in order to demonstrate synthesis of PT education.  Goal status: INITIAL  2.  Pt will be able to perform 5XSTS in under 12s  in order to demonstrate functional improvement above the cut off score for adults.   Goal status: INITIAL  3.  Pt will be able to demonstrate/report ability to walk >10 mins with <3/10 pain pain in order to demonstrate functional improvement and tolerance to exercise and community mobility.   Goal  status: INITIAL  4.  Pt will be able to lift//hold >15lbs in order to demonstrate functional improvement in lumbopelvic strength for return to ADL and exercise.   Goal status: INITIAL  5.  Pelvic floor goals to be set at time of (re)evaluation  Goal status: INITIAL   PLAN: PT FREQUENCY: 1-2x/week  PT DURATION: 12 weeks (likely D/C in 8 wks)  PLANNED INTERVENTIONS:  Therapeutic exercises, Therapeutic activity, Neuromuscular re-education, Balance training, Gait training, Patient/Family education, Self Care, Joint mobilization, Joint manipulation, Stair training, Orthotic/Fit training, DME instructions, Aquatic Therapy, Dry Needling, Electrical stimulation, Spinal manipulation, Spinal mobilization, Cryotherapy, Moist heat, scar mobilization, Taping, Vasopneumatic device, Traction, Ultrasound, Biofeedback, Ionotophoresis '4mg'$ /ml Dexamethasone, Manual therapy, and Re-evaluation.  PLAN FOR NEXT SESSION:  aquatic therapy: gentle UE motion, light hip and lumbar mobility; chronic pain relief   Kerin Perna, PTA 12/08/21 3:21 PM [facility] [address] [place], [place], [address] Phone: [phone]   Fax:  [phone]

## 2021-12-09 ENCOUNTER — Encounter (HOSPITAL_BASED_OUTPATIENT_CLINIC_OR_DEPARTMENT_OTHER): Payer: Self-pay | Admitting: Physical Therapy

## 2021-12-12 NOTE — Therapy (Signed)
OUTPATIENT PHYSICAL THERAPY THORACOLUMBAR TREATMENT  Progress Note Reporting Period 10/31/21 to 12/13/21  See note below for Objective Data and Assessment of Progress/Goals.     Patient Name: Meghan Carter MRN: [id number] DOB:1980/03/16, 41 y.o., female Today's Date: 12/13/2021   PT End of Session - 12/13/21 1455     Visit Number 9    Number of Visits 18    Date for PT Re-Evaluation 01/29/22    Authorization Type [employer]    PT Start Time 1446    PT Stop Time 9833    PT Time Calculation (min) 44 min    Activity Tolerance Patient tolerated treatment well    Behavior During Therapy [facility] for tasks assessed/performed               Past Medical History:  Diagnosis Date   Acute blood loss anemia 03/26/2015   Anemia    Anxiety    Atherosclerosis of arteries    Blood transfusion without reported diagnosis 2017   2 units, no reaction   Callus of foot 02/08/2011   Depression    Dysmenorrhea    Endometrial polyp 02/11/2014   Fecal occult blood test positive 01/27/2014   Fecal occult blood test positive 01/27/2014   Fibroid    High-tone pelvic floor dysfunction 03/17/2015   History of abnormal cervical Pap smear 02/11/2014   History of acute pancreatitis 03/31/2011   HSV (herpes simplex virus) infection    Hydrosalpinx    Bilateral   Internal hemorrhoids    Lichenification and lichen simplex chronicus 11/07/2010   Lower abdominal pain    Menorrhagia with regular cycle 01/27/2014   Menorrhagia with regular cycle 01/27/2014   Other fatigue    Pelvic pain in female 01/27/2014   Plantar fasciitis 02/08/2011   Rectal bleeding 01/27/2014   Rectal pain 03/19/2014   Severe anemia    Vaginal Pap smear, abnormal    Past Surgical History:  Procedure Laterality Date   CHROMOPERTUBATION  08/14/2017   Procedure: CHROMOPERTUBATION;  Surgeon: Governor Specking, MD;  Location: [facility];  Service: Gynecology;;   COLONOSCOPY N/A 03/23/2014   SLF: 1. The examined terminal ileum appeared to be normal.  2. The left colon is redundant 3. Moderate sized internal hemorrhoids   ECTOPIC PREGNANCY SURGERY Left 2005   FLEXIBLE SIGMOIDOSCOPY N/A 04/01/2015   SLF: rectal bleeding due to large internal hemorrhoids 2. anemia due to rectal bleeding/memses/ poor iron intake.   FLEXIBLE SIGMOIDOSCOPY N/A 03/27/2015   Procedure: FLEXIBLE SIGMOIDOSCOPY;  Surgeon: Danie Binder, MD;  Location: [facility];  Service: Endoscopy;  Laterality: N/A;   HEMORRHOID BANDING N/A 03/23/2014   Procedure: HEMORRHOID BANDING;  Surgeon: Danie Binder, MD;  Location: [facility];  Service: Endoscopy;  Laterality: N/A;   HEMORRHOID BANDING  03/27/2015   Procedure: HEMORRHOID BANDING;  Surgeon: Danie Binder, MD;  Location: [facility];  Service: Endoscopy;;   HEMORRHOID SURGERY N/A 01/11/2018   Procedure: EXTENSIVE HEMORRHOIDECTOMY;  Surgeon: Aviva Signs, MD;  Location: [facility];  Service: General;  Laterality: N/A;   HYSTEROSCOPY WITH D & C N/A 06/16/2014   Procedure: DILATATION AND CURETTAGE /HYSTEROSCOPY;  Surgeon: Jonnie Kind, MD;  Location: [facility];  Service: Gynecology;  Laterality: N/A;   LAPAROSCOPIC UNILATERAL SALPINGECTOMY Right 06/16/2014   Procedure: LAPAROSCOPIC UNILATERAL SALPINGECTOMY;  Surgeon: Jonnie Kind, MD;  Location: [facility];  Service: Gynecology;  Laterality: Right;   LAPAROSCOPY N/A 08/14/2017   Procedure: OPERATIVE LAPAROSCOPY ,LEFT SALPINGECTOMY, LYSIS OF ADHESIONS;  Surgeon: Governor Specking, MD;  Location: [facility];  Service: Gynecology;  Laterality: N/A;   POLYPECTOMY N/A 06/16/2014   Procedure: POLYPECTOMY (endometrial);  Surgeon: Jonnie Kind, MD;  Location: [facility];  Service: Gynecology;  Laterality: N/A;   Patient Active Problem List   Diagnosis Date Noted   Lichen planus 25/85/2778   Spine pain, cervical 01/25/2021   Acute post-traumatic headache, not intractable 01/25/2021   Encounter for support and coordination of transition of care 01/25/2021   Leiomyoma 12/29/2020   Morbid  obesity ([place]) 12/29/2020   Enlarged thyroid 06/29/2020   Constipation 06/29/2020   Annual physical exam 01/23/2019   Recurrent major depressive disorder, in partial remission ([place]) 01/14/2019   Depression, major, single episode, moderate ([place]) 11/19/2018   Anxiety 11/19/2018   Herpes simplex type 2 infection 01/24/2018   Internal and external bleeding hemorrhoids    History of abnormal cervical Pap smear 02/11/2014   Hemorrhoids, internal 01/27/2014   Nondependent alcohol abuse, episodic drinking behavior 03/31/2011    PCP: [facility], Trenton Gammon, NP  REFERRING PROVIDER: Carole Civil, MD  Porfirio Mylar, FNP (pelvic referral)  REFERRING DIAG:  M50.30 (ICD-10-CM) - DDD (degenerative disc disease), cervical  M51.37 (ICD-10-CM) - DDD (degenerative disc disease), lumbosacral  M54.50,G89.29 (ICD-10-CM) - Chronic midline low back pain, unspecified whether sciatica present   R10.2 (ICD-10-CM) - Pelvic and perineal pain  G89.29 (ICD-10-CM) - Other chronic pain  M54.50 (ICD-10-CM) - Low back pain, unspecified  M25.551 (ICD-10-CM) - Pain in right hip  M25.552 (ICD-10-CM) - Pain in left hip   Rationale for Evaluation and Treatment Rehabilitation  THERAPY DIAG:  Cervicalgia  Other low back pain  Muscle weakness (generalized)  Other muscle spasm  ONSET DATE: 2023  SUBJECTIVE:                                                                                                                                                                                           SUBJECTIVE STATEMENT: "I vacuumed, mopped and changed my bed sheets on Sun and it spiked my pain I am still hurting today"  PERTINENT HISTORY:   February 07 2022 surgery- planned to be sooner; hysterectomy  3 abdominal surgeries, one for miscarriage in 2005, 2016 was cyst removal and salpingectomy Rt, Lt salpingectomy 2019 and some endometriosis.  PAIN:  Are you having pain? Yes:  NPRS scale: 10/10 Pain  location:  L/S and pelvis; R hip Pain description: sharp; dull; shooting Aggravating factors: standing, sitting, cooking, cleaning, groceries, stairs Relieving factors: resting, sitting   PRECAUTIONS: None  WEIGHT BEARING RESTRICTIONS No  FALLS:  Has patient fallen in last 6 months?  No  LIVING ENVIRONMENT: Lives with: lives with their family Lives in: House/apartment Stairs: Yes Has following equipment at home: Single point cane  OCCUPATION: on leave from work at the moment; Producer, television/film/video; climbing ladders     PLOF: Independent with basic ADLs  PATIENT GOALS :    OBJECTIVE: (findings taken at evaluation unless otherwise noted)  DIAGNOSTIC FINDINGS:  IMPRESSION: No acute fracture or dislocation. Minimal degenerative joint changes of lower thoracic spine.     Lumbar X-ray IMPRESSION: Negative.  IMPRESSION: No acute fracture or dislocation. Mild degenerative joint changes of cervical spine.  PATIENT SURVEYS:  Modified Oswestry    NDI:   SCREENING FOR RED FLAGS: Bowel or bladder incontinence: Yes: history of urge incontinence Spinal tumors: No Cauda equina syndrome: No Compression fracture: No   COGNITION:  Overall cognitive status: Within functional limits for tasks assessed     SENSATION: Light touch: WFL  POSTURE: rounded shoulders, decreased lumbar lordosis, and anterior pelvic tilt  LUMBAR ROM:  all lumbar ROM exacerbates  pelvic discomfort and pressure; held further ROM screening of lumbar and LE due to pelvic pain  Cervical ROM:   Active  A/PROM  eval   Flexion WFL   Extension WFL   Right lateral flexion 75% HA sensation   Left lateral flexion 75% HA sensation   Right rotation 75%   Left rotation 75%    (Blank rows = not tested)  LOWER EXTREMITY MMT:    Active  Right eval Left eval Right / Left  Hip flexion 4-/5 p! 4/5 4-/5  4/5 p!  Hip extension     Hip abduction 4-/5 p! 4-/5 p! 4-/5  p!  Hip adduction 4-/5 p!  4-/5 p! 4-/5p!   (Blank rows = not tested)  Grip Strength:  R 15lbs; 15,15;  L 15lbs, 20, 20   FUNCTIONAL TESTS:  STS transfers: requires single UE for standing 11/7: 5X STS: 22.6  GAIT: Distance walked: 44f Assistive device utilized: None Level of assistance: Complete Independence Comments: toe out gait, decreased hip extension, hip hiking, decreased step length    TODAY'S TREATMENT  Pt seen for aquatic therapy today.  Treatment took place in water 3.25-4.5 ft in depth at the [facility] Temp of water was 92.  Pt entered/exited the pool via stairs independently with bilat rail.   * unsupported walking forward, backward *lumbopelvic mobilization: seated on squoodle:  Ant/poster pelvic tilts; hip hiking; rotation * staggered stance then feet together with kick board row x 20 each side * hands flat on board with trunk rotation for stretch, then board upright with resisted rotation (short range)  * suspended by noodle between LEs : cycling, hip abdc/add, cc ski.  Ue supported on yellow hand buoys * Plank with on bench, hip ext x 10, cues to not over ext lumbar and maintain neutral head/spine * ab set with thin square noodle pull downs to thighs x 15 *1 foam hand buoys submerged for core engagement forward and backward amb  Pt requires the buoyancy and hydrostatic pressure of water for support, and to offload joints by unweighting joint load by at least 50 % in navel deep water and by at least 75-80% in chest to neck deep water.  Viscosity of the water is needed for resistance of strengthening. Water current perturbations provides challenge to standing balance requiring increased core activation.     PATIENT EDUCATION:  Education details: aquatic progressions  Person educated: Patient Education method: Explanation, Demonstration, Tactile cues, Verbal cues  Education comprehension: verbalized understanding, returned demonstration, verbal cues required, and  tactile cues required   HOME EXERCISE PROGRAM: Access Code: [email or web address] URL: [email or web address] Date: 10/31/2021 Prepared by: Daleen Bo  Aquatic:  Access Code: [phone] URL: [email or web address] Date: 11/17/2021 Prepared by: [place]  Exercises - Forward Walking  - 1 x daily - 7 x weekly - 3 sets - Side Stepping  - 1 x daily - 7 x weekly - 3 sets - Standing Row with Anchored Resistance  - 1 x daily - 7 x weekly - 1 sets - 10 reps - Standing 3-way leg kick holding noodle  - 1 x daily - 7 x weekly - 1 sets - 10 reps - Single Leg Stance Clamshell  - 1 x daily - 7 x weekly - 1 sets - 10 reps - Heel Toe Raises at [place]  - 1 x daily - 7 x weekly - 1 sets - 10 reps - Seated Straddle on Flotation Forward Breast Stroke Arms and Bicycle Legs  - 1 x daily - 7 x weekly - 3 sets - 10 reps - [place] Ski with Foam Dumbbells and Ankle Floats  - 1 x daily - 7 x weekly - 3 sets - 10 reps  ASSESSMENT:  CLINICAL IMPRESSION:. PN:Pt reporting pain spike from completing household chores.  She is encouraged to split the chores up and complete on varying days. Improvement with sitting balance on noodle able to maintain position and cycle ue support on hand buoys rather than wall with vc and demonstration.  Pt pleased with her progress. Continued cues for abdominal bracing for constant core engagement throughout session. She reports reduced pain sx after session by ~2 points. Testing is completed today but no changes consistent with pain flair she is experiencing.  She has improved her toleration to activity as evidenced by completing w/e chores but needs to learn her limits for improved management. Her overall pain has been better managed up to this point. NDI score:68%.  She will continue to benefit from skilled PT intervention aqyuatic and future land based to be safe and indep with all functional mobility and reach her  goals.       OBJECTIVE IMPAIRMENTS Abnormal gait, decreased activity tolerance, decreased balance, decreased coordination, decreased knowledge of condition, decreased mobility, difficulty walking, decreased ROM, decreased strength, hypomobility, increased muscle spasms, impaired flexibility, improper body mechanics, postural dysfunction, and pain.   ACTIVITY LIMITATIONS carrying, lifting, bending, sitting, squatting, sleeping, stairs, transfers, and locomotion level  PARTICIPATION LIMITATIONS: cleaning, interpersonal relationship, driving, shopping, community activity, occupation, and exercise  PERSONAL FACTORS Age, Behavior pattern, Fitness, Time since onset of injury/illness/exacerbation, social, and 3+ comorbidities:  are also affecting patient's functional outcome.   REHAB POTENTIAL: Fair    CLINICAL DECISION MAKING: Evolving/moderate complexity  EVALUATION COMPLEXITY: Moderate   GOALS:   SHORT TERM GOALS: Target date: 12/12/2021  Pt will become independent with HEP in order to demonstrate synthesis of PT education. Goal status: met  2.  Pt will report at least 2 pt reduction on NPRS scale for pain in order to demonstrate functional improvement with household activity, self care, and ADL.   Goal status: ongoing pain is variable  3.  Pt will demonstrate at least a 12.8 improvement in Modified Oswestry Index in order to demonstrate a clinically significant change in LBP and function.   Goal status: ongoing  4. Pt will demonstrate at least a 7.5 improvement in Neck Disability Index in order  to demonstrate a clinically significant change in neck pain and function.   Goal status: INITIAL    LONG TERM GOALS: Target date: 01/23/2022  Pt  will become independent with final HEP in order to demonstrate synthesis of PT education.  Goal status: INITIAL  2.  Pt will be able to perform 5XSTS in under 12s  in order to demonstrate functional improvement above the cut off score  for adults.   Goal status: INITIAL  3.  Pt will be able to demonstrate/report ability to walk >10 mins with <3/10 pain pain in order to demonstrate functional improvement and tolerance to exercise and community mobility.   Goal status: INITIAL  4.  Pt will be able to lift//hold >15lbs in order to demonstrate functional improvement in lumbopelvic strength for return to ADL and exercise.   Goal status: INITIAL  5.  Pelvic floor goals to be set at time of (re)evaluation  Goal status: INITIAL   PLAN: PT FREQUENCY: 1-2x/week  PT DURATION: 12 weeks (likely D/C in 8 wks)  PLANNED INTERVENTIONS: Therapeutic exercises, Therapeutic activity, Neuromuscular re-education, Balance training, Gait training, Patient/Family education, Self Care, Joint mobilization, Joint manipulation, Stair training, Orthotic/Fit training, DME instructions, Aquatic Therapy, Dry Needling, Electrical stimulation, Spinal manipulation, Spinal mobilization, Cryotherapy, Moist heat, scar mobilization, Taping, Vasopneumatic device, Traction, Ultrasound, Biofeedback, Ionotophoresis 24m/ml Dexamethasone, Manual therapy, and Re-evaluation.  PLAN FOR NEXT SESSION:  aquatic therapy: gentle UE motion, light hip and lumbar mobility; chronic pain relief   MAnnamarie Major Niza Soderholm MPT 12/13/21 3:41 PM [facility] [address]um[place] [address]Phone: [phone]  Fax:  [phone]

## 2021-12-13 ENCOUNTER — Telehealth: Payer: Self-pay | Admitting: *Deleted

## 2021-12-13 ENCOUNTER — Ambulatory Visit (HOSPITAL_BASED_OUTPATIENT_CLINIC_OR_DEPARTMENT_OTHER): Payer: Medicaid Other | Admitting: Physical Therapy

## 2021-12-13 ENCOUNTER — Encounter (HOSPITAL_BASED_OUTPATIENT_CLINIC_OR_DEPARTMENT_OTHER): Payer: Self-pay | Admitting: Physical Therapy

## 2021-12-13 DIAGNOSIS — M542 Cervicalgia: Secondary | ICD-10-CM

## 2021-12-13 DIAGNOSIS — M6281 Muscle weakness (generalized): Secondary | ICD-10-CM

## 2021-12-13 DIAGNOSIS — M5459 Other low back pain: Secondary | ICD-10-CM

## 2021-12-13 DIAGNOSIS — M62838 Other muscle spasm: Secondary | ICD-10-CM

## 2021-12-13 NOTE — Patient Instructions (Signed)
Meghan Carter  12/13/2021     '@[email or web address]'$ @   Your procedure is scheduled on  12/16/2021.   Report to [facility] at  1115  A.M.   Call this number if you have problems the morning of surgery:  [phone]  If you experience any cold or flu symptoms such as cough, fever, chills, shortness of breath, etc. between now and your scheduled surgery, please notify us at the above number.   Remember:   Follow the diet and prep instructions given to you by the office.    Take these medicines the morning of surgery with A SIP OF WATER                             pepcid, hydroxyzine.     Do not wear jewelry, make-up or nail polish.  Do not wear lotions, powders, or perfumes, or deodorant.  Do not shave 48 hours prior to surgery.  Men may shave face and neck.  Do not bring valuables to the hospital.  [facility] is not responsible for any belongings or valuables.  Contacts, dentures or bridgework may not be worn into surgery.  Leave your suitcase in the car.  After surgery it may be brought to your room.  For patients admitted to the hospital, discharge time will be determined by your treatment team.  Patients discharged the day of surgery will not be allowed to drive home and must have someone with them for 24 hours.    Special instructions:   DO NOT smoke tobacco or vape for 24 hours before your procedure.  Please read over the following fact sheets that you were given. Anesthesia Post-op Instructions and Care and Recovery After Surgery      Upper Endoscopy, Adult, Care After After the procedure, it is common to have a sore throat. It is also common to have: Mild stomach pain or discomfort. Bloating. Nausea. Follow these instructions at home: The instructions below may help you care for yourself at home. Your health care provider may give you more instructions. If you have questions, ask your health care provider. If you were given a sedative during the  procedure, it can affect you for several hours. Do not drive or operate machinery until your health care provider says that it is safe. If you will be going home right after the procedure, plan to have a responsible adult: Take you home from the hospital or clinic. You will not be allowed to drive. Care for you for the time you are told. Follow instructions from your health care provider about what you may eat and drink. Return to your normal activities as told by your health care provider. Ask your health care provider what activities are safe for you. Take over-the-counter and prescription medicines only as told by your health care provider. Contact a health care provider if you: Have a sore throat that lasts longer than one day. Have trouble swallowing. Have a fever. Get help right away if you: Vomit blood or your vomit looks like coffee grounds. Have bloody, black, or tarry stools. Have a very bad sore throat or you cannot swallow. Have difficulty breathing or very bad pain in your chest or abdomen. These symptoms may be an emergency. Get help right away. Call 911. Do not wait to see if the symptoms will go away. Do not drive yourself to the hospital. Summary After the procedure,  it is common to have a sore throat, mild stomach discomfort, bloating, and nausea. If you were given a sedative during the procedure, it can affect you for several hours. Do not drive until your health care provider says that it is safe. Follow instructions from your health care provider about what you may eat and drink. Return to your normal activities as told by your health care provider. This information is not intended to replace advice given to you by your health care provider. Make sure you discuss any questions you have with your health care provider. Document Revised: 05/04/2021 Document Reviewed: 05/04/2021 Elsevier Patient Education  [place]. Colonoscopy, Adult, Care After The following  information offers guidance on how to care for yourself after your procedure. Your health care provider may also give you more specific instructions. If you have problems or questions, contact your health care provider. What can I expect after the procedure? After the procedure, it is common to have: A small amount of blood in your stool for 24 hours after the procedure. Some gas. Mild cramping or bloating of your abdomen. Follow these instructions at home: Eating and drinking  Drink enough fluid to keep your urine pale yellow. Follow instructions from your health care provider about eating or drinking restrictions. Resume your normal diet as told by your health care provider. Avoid heavy or fried foods that are hard to digest. Activity Rest as told by your health care provider. Avoid sitting for a long time without moving. Get up to take short walks every 1-2 hours. This is important to improve blood flow and breathing. Ask for help if you feel weak or unsteady. Return to your normal activities as told by your health care provider. Ask your health care provider what activities are safe for you. Managing cramping and bloating  Try walking around when you have cramps or feel bloated. If directed, apply heat to your abdomen as told by your health care provider. Use the heat source that your health care provider recommends, such as a moist heat pack or a heating pad. Place a towel between your skin and the heat source. Leave the heat on for 20-30 minutes. Remove the heat if your skin turns bright red. This is especially important if you are unable to feel pain, heat, or cold. You have a greater risk of getting burned. General instructions If you were given a sedative during the procedure, it can affect you for several hours. Do not drive or operate machinery until your health care provider says that it is safe. For the first 24 hours after the procedure: Do not sign important documents. Do  not drink alcohol. Do your regular daily activities at a slower pace than normal. Eat soft foods that are easy to digest. Take over-the-counter and prescription medicines only as told by your health care provider. Keep all follow-up visits. This is important. Contact a health care provider if: You have blood in your stool 2-3 days after the procedure. Get help right away if: You have more than a small spotting of blood in your stool. You have large blood clots in your stool. You have swelling of your abdomen. You have nausea or vomiting. You have a fever. You have increasing pain in your abdomen that is not relieved with medicine. These symptoms may be an emergency. Get help right away. Call 911. Do not wait to see if the symptoms will go away. Do not drive yourself to the hospital. Summary After the procedure,  it is common to have a small amount of blood in your stool. You may also have mild cramping and bloating of your abdomen. If you were given a sedative during the procedure, it can affect you for several hours. Do not drive or operate machinery until your health care provider says that it is safe. Get help right away if you have a lot of blood in your stool, nausea or vomiting, a fever, or increased pain in your abdomen. This information is not intended to replace advice given to you by your health care provider. Make sure you discuss any questions you have with your health care provider. Document Revised: 09/15/2020 Document Reviewed: 09/15/2020 Elsevier Patient Education  [place] After The following information offers guidance on how to care for yourself after your procedure. Your health care provider may also give you more specific instructions. If you have problems or questions, contact your health care provider. What can I expect after the procedure? After the procedure, it is common to have: Tiredness. Little or no memory about what  happened during or after the procedure. Impaired judgment when it comes to making decisions. Nausea or vomiting. Some trouble with balance. Follow these instructions at home: For the time period you were told by your health care provider:  Rest. Do not participate in activities where you could fall or become injured. Do not drive or use machinery. Do not drink alcohol. Do not take sleeping pills or medicines that cause drowsiness. Do not make important decisions or sign legal documents. Do not take care of children on your own. Medicines Take over-the-counter and prescription medicines only as told by your health care provider. If you were prescribed antibiotics, take them as told by your health care provider. Do not stop using the antibiotic even if you start to feel better. Eating and drinking Follow instructions from your health care provider about what you may eat and drink. Drink enough fluid to keep your urine pale yellow. If you vomit: Drink clear fluids slowly and in small amounts as you are able. Clear fluids include water, ice chips, low-calorie sports drinks, and fruit juice that has water added to it (diluted fruit juice). Eat light and bland foods in small amounts as you are able. These foods include bananas, applesauce, rice, lean meats, toast, and crackers. General instructions  Have a responsible adult stay with you for the time you are told. It is important to have someone help care for you until you are awake and alert. If you have sleep apnea, surgery and some medicines can increase your risk for breathing problems. Follow instructions from your health care provider about wearing your sleep device: When you are sleeping. This includes during daytime naps. While taking prescription pain medicines, sleeping medicines, or medicines that make you drowsy. Do not use any products that contain nicotine or tobacco. These products include cigarettes, chewing tobacco, and vaping  devices, such as e-cigarettes. If you need help quitting, ask your health care provider. Contact a health care provider if: You feel nauseous or vomit every time you eat or drink. You feel light-headed. You are still sleepy or having trouble with balance after 24 hours. You get a rash. You have a fever. You have redness or swelling around the IV site. Get help right away if: You have trouble breathing. You have new confusion after you get home. These symptoms may be an emergency. Get help right away. Call 911. Do not wait to  see if the symptoms will go away. Do not drive yourself to the hospital. This information is not intended to replace advice given to you by your health care provider. Make sure you discuss any questions you have with your health care provider. Document Revised: 06/20/2021 Document Reviewed: 06/20/2021 Elsevier Patient Education  [place].

## 2021-12-13 NOTE — Telephone Encounter (Signed)
Received fax from [place] [place] [place]- [place] fax.    Electronically signed HIPPA Compliant Authorization for Release of Medical Information enclosed with date of 09/06/2021.   Medical records requested: 01/18/2020- present    Medical Record Request Case Number: [id number]  No records noted for [facility] for time period requested.

## 2021-12-14 ENCOUNTER — Encounter (HOSPITAL_COMMUNITY): Payer: Self-pay

## 2021-12-14 ENCOUNTER — Encounter (HOSPITAL_COMMUNITY)
Admission: RE | Admit: 2021-12-14 | Discharge: 2021-12-14 | Disposition: A | Discharge status: Home / Self Care | Payer: Self-pay | Source: Ambulatory Visit | Attending: Internal Medicine | Admitting: Internal Medicine

## 2021-12-14 ENCOUNTER — Telehealth: Payer: Self-pay | Admitting: *Deleted

## 2021-12-14 DIAGNOSIS — D649 Anemia, unspecified: Secondary | ICD-10-CM | POA: Insufficient documentation

## 2021-12-14 DIAGNOSIS — Z01818 Encounter for other preprocedural examination: Secondary | ICD-10-CM | POA: Insufficient documentation

## 2021-12-14 HISTORY — DX: Chronic kidney disease, unspecified: N18.9

## 2021-12-14 HISTORY — DX: Tinnitus, unspecified ear: H93.19

## 2021-12-14 HISTORY — DX: Prediabetes: R73.03

## 2021-12-14 LAB — POCT PREGNANCY, URINE: Preg Test, Ur: NEGATIVE

## 2021-12-14 NOTE — Telephone Encounter (Signed)
Pt called and left a message stating that she had been at pre-op for a couple of hours and had not been called back. She stated that she was nervous and was going to walk out if someone did not call her right away. She states she is scared to have the procedure done by a provider she has never met and knows nothing about. I called the pt back and she informed me that right after she left that message she was called back. She states she feels better about the procedure because the nurse explained everything to her. She states that she thinks the devil was trying to get her, but she won't let him. Informed her to call office if she has any more questions or problems.

## 2021-12-15 ENCOUNTER — Encounter (HOSPITAL_BASED_OUTPATIENT_CLINIC_OR_DEPARTMENT_OTHER): Payer: Self-pay | Admitting: Physical Therapy

## 2021-12-15 ENCOUNTER — Ambulatory Visit (HOSPITAL_BASED_OUTPATIENT_CLINIC_OR_DEPARTMENT_OTHER): Payer: Medicaid Other | Admitting: Physical Therapy

## 2021-12-15 DIAGNOSIS — M6281 Muscle weakness (generalized): Secondary | ICD-10-CM | POA: Diagnosis not present

## 2021-12-15 DIAGNOSIS — M542 Cervicalgia: Secondary | ICD-10-CM | POA: Diagnosis not present

## 2021-12-15 DIAGNOSIS — M62838 Other muscle spasm: Secondary | ICD-10-CM | POA: Diagnosis not present

## 2021-12-15 DIAGNOSIS — M5459 Other low back pain: Secondary | ICD-10-CM | POA: Diagnosis not present

## 2021-12-15 NOTE — Therapy (Signed)
OUTPATIENT PHYSICAL THERAPY THORACOLUMBAR TREATMENT   Patient Name: Meghan Carter MRN: [id number] DOB:07-14-1980, 41 y.o., female Today's Date: 12/15/2021   PT End of Session - 12/15/21 1443     Visit Number 10    Number of Visits 18    Date for PT Re-Evaluation 01/29/22    Authorization Type [employer]    PT Start Time 1443    PT Stop Time 1523    PT Time Calculation (min) 40 min    Behavior During Therapy [facility] for tasks assessed/performed               Past Medical History:  Diagnosis Date   Acute blood loss anemia 03/26/2015   Anemia    Anxiety    Atherosclerosis of arteries    Blood transfusion without reported diagnosis 2017   2 units, no reaction   Callus of foot 02/08/2011   Chronic kidney disease    Depression    Dysmenorrhea    Endometrial polyp 02/11/2014   Fecal occult blood test positive 01/27/2014   Fecal occult blood test positive 01/27/2014   Fibroid    High-tone pelvic floor dysfunction 03/17/2015   History of abnormal cervical Pap smear 02/11/2014   History of acute pancreatitis 03/31/2011   HSV (herpes simplex virus) infection    Hydrosalpinx    Bilateral   Internal hemorrhoids    Lichenification and lichen simplex chronicus 11/07/2010   Lower abdominal pain    Menorrhagia with regular cycle 01/27/2014   Menorrhagia with regular cycle 01/27/2014   Other fatigue    Pelvic pain in female 01/27/2014   Plantar fasciitis 02/08/2011   Prediabetes    Rectal bleeding 01/27/2014   Rectal pain 03/19/2014   Severe anemia    Tinnitus    Vaginal Pap smear, abnormal    Past Surgical History:  Procedure Laterality Date   CHROMOPERTUBATION  08/14/2017   Procedure: CHROMOPERTUBATION;  Surgeon: Governor Specking, MD;  Location: [facility];  Service: Gynecology;;   COLONOSCOPY N/A 03/23/2014   SLF: 1. The examined terminal ileum appeared to be normal. 2. The left colon is redundant 3. Moderate sized internal hemorrhoids   ECTOPIC PREGNANCY SURGERY Left 2005    FLEXIBLE SIGMOIDOSCOPY N/A 04/01/2015   SLF: rectal bleeding due to large internal hemorrhoids 2. anemia due to rectal bleeding/memses/ poor iron intake.   FLEXIBLE SIGMOIDOSCOPY N/A 03/27/2015   Procedure: FLEXIBLE SIGMOIDOSCOPY;  Surgeon: Danie Binder, MD;  Location: [facility];  Service: Endoscopy;  Laterality: N/A;   HEMORRHOID BANDING N/A 03/23/2014   Procedure: HEMORRHOID BANDING;  Surgeon: Danie Binder, MD;  Location: [facility];  Service: Endoscopy;  Laterality: N/A;   HEMORRHOID BANDING  03/27/2015   Procedure: HEMORRHOID BANDING;  Surgeon: Danie Binder, MD;  [facility]: [facility];  Service: Endoscopy;;   HEMORRHOID SURGERY N/A 01/11/2018   Procedure: EXTENSIVE HEMORRHOIDECTOMY;  Surgeon: Aviva Signs, MD;  Location: [facility];  Service: General;  Laterality: N/A;   HYSTEROSCOPY WITH D & C N/A 06/16/2014   Procedure: DILATATION AND CURETTAGE /HYSTEROSCOPY;  Surgeon: Jonnie Kind, MD;  Location: [facility];  Service: Gynecology;  Laterality: N/A;   LAPAROSCOPIC UNILATERAL SALPINGECTOMY Right 06/16/2014   Procedure: LAPAROSCOPIC UNILATERAL SALPINGECTOMY;  Surgeon: Jonnie Kind, MD;  Location: [facility];  Service: Gynecology;  Laterality: Right;   LAPAROSCOPY N/A 08/14/2017   Procedure: OPERATIVE LAPAROSCOPY ,LEFT SALPINGECTOMY, LYSIS OF ADHESIONS;  Surgeon: Governor Specking, MD;  Location: [facility];  Service: Gynecology;  Laterality: N/A;  POLYPECTOMY N/A 06/16/2014   Procedure: POLYPECTOMY (endometrial);  Surgeon: Jonnie Kind, MD;  Location: [facility];  Service: Gynecology;  Laterality: N/A;   Patient Active Problem List   Diagnosis Date Noted   Lichen planus 37/16/9678   Spine pain, cervical 01/25/2021   Acute post-traumatic headache, not intractable 01/25/2021   Encounter for support and coordination of transition of care 01/25/2021   Leiomyoma 12/29/2020   Morbid obesity ([place]) 12/29/2020   Enlarged thyroid 06/29/2020   Constipation 06/29/2020   Annual physical exam  01/23/2019   Recurrent major depressive disorder, in partial remission ([place]) 01/14/2019   Depression, major, single episode, moderate ([place]) 11/19/2018   Anxiety 11/19/2018   Herpes simplex type 2 infection 01/24/2018   Internal and external bleeding hemorrhoids    History of abnormal cervical Pap smear 02/11/2014   Hemorrhoids, internal 01/27/2014   Nondependent alcohol abuse, episodic drinking behavior 03/31/2011    PCP: [facility], Trenton Gammon, NP  REFERRING PROVIDER: Carole Civil, MD  Porfirio Mylar, FNP (pelvic referral)  REFERRING DIAG:  M50.30 (ICD-10-CM) - DDD (degenerative disc disease), cervical  M51.37 (ICD-10-CM) - DDD (degenerative disc disease), lumbosacral  M54.50,G89.29 (ICD-10-CM) - Chronic midline low back pain, unspecified whether sciatica present   R10.2 (ICD-10-CM) - Pelvic and perineal pain  G89.29 (ICD-10-CM) - Other chronic pain  M54.50 (ICD-10-CM) - Low back pain, unspecified  M25.551 (ICD-10-CM) - Pain in right hip  M25.552 (ICD-10-CM) - Pain in left hip   Rationale for Evaluation and Treatment Rehabilitation  THERAPY DIAG:  Cervicalgia  Other low back pain  Muscle weakness (generalized)  Other muscle spasm  ONSET DATE: 2023  SUBJECTIVE:                                                                                                                                                                                           SUBJECTIVE STATEMENT: "I think I am still hurting from all of the work I did around the house on Sunday".    PERTINENT HISTORY:   February 07 2022 surgery- planned to be sooner; hysterectomy  3 abdominal surgeries, one for miscarriage in 2005, 2016 was cyst removal and salpingectomy Rt, Lt salpingectomy 2019 and some endometriosis.  PAIN:  Are you having pain? Yes:  NPRS scale: 10/10 Pain location:  neck, low back; Lt ankle Pain description: sharp; dull; shooting Aggravating factors: standing, sitting, cooking,  cleaning, groceries, stairs Relieving factors: resting, sitting   PRECAUTIONS: None  WEIGHT BEARING RESTRICTIONS No  FALLS:  Has patient fallen in last 6 months? No  LIVING ENVIRONMENT: Lives with: lives with their family Lives in: House/apartment Stairs: Yes Has  following equipment at home: Single point cane  OCCUPATION: on leave from work at the moment; Producer, television/film/video; climbing ladders     PLOF: Independent with basic ADLs  PATIENT GOALS :    OBJECTIVE: (findings taken at evaluation unless otherwise noted)  DIAGNOSTIC FINDINGS:  IMPRESSION: No acute fracture or dislocation. Minimal degenerative joint changes of lower thoracic spine.     Lumbar X-ray IMPRESSION: Negative.  IMPRESSION: No acute fracture or dislocation. Mild degenerative joint changes of cervical spine.  PATIENT SURVEYS:  Modified Oswestry    NDI:   SCREENING FOR RED FLAGS: Bowel or bladder incontinence: Yes: history of urge incontinence Spinal tumors: No Cauda equina syndrome: No Compression fracture: No   COGNITION:  Overall cognitive status: Within functional limits for tasks assessed     SENSATION: Light touch: WFL  POSTURE: rounded shoulders, decreased lumbar lordosis, and anterior pelvic tilt  LUMBAR ROM:  all lumbar ROM exacerbates  pelvic discomfort and pressure; held further ROM screening of lumbar and LE due to pelvic pain  Cervical ROM:   Active  A/PROM  eval   Flexion WFL   Extension WFL   Right lateral flexion 75% HA sensation   Left lateral flexion 75% HA sensation   Right rotation 75%   Left rotation 75%    (Blank rows = not tested)  LOWER EXTREMITY MMT:    Active  Right eval Left eval Right / Left  Hip flexion 4-/5 p! 4/5 4-/5  4/5 p!  Hip extension     Hip abduction 4-/5 p! 4-/5 p! 4-/5  p!  Hip adduction 4-/5 p! 4-/5 p! 4-/5p!   (Blank rows = not tested)  Grip Strength:  R 15lbs; 15,15;  L 15lbs, 20, 20   FUNCTIONAL TESTS:   STS transfers: requires single UE for standing 11/7: 5X STS: 22.6  GAIT: Distance walked: 76f Assistive device utilized: None Level of assistance: Complete Independence Comments: toe out gait, decreased hip extension, hip hiking, decreased step length    TODAY'S TREATMENT  Pt seen for aquatic therapy today.  Treatment took place in water 3.25-4.5 ft in depth at the [facility] Temp of water was 92.  Pt entered/exited the pool via stairs independently with bilat rail.   * unsupported walking forward, backward * walking forward/ backwards with row motion holding yellow noodle and rainbow hand floats * side stepping with shoulder addct with rainbow hand floats; push downs at side x 5 * TrA set with blue noodle push down * plank with hip ext with hands on bench  *seated on bench with feet on blue step - lumbar stretch with hands on kick board, repeated with lateral flexion; STS x 10  -cues to maintain neutral head/spine  * suspended by noodle between LEs : UE supported on yellow hand buoys-cycling,  holding corner: hip abdc/add, cc ski.    * monster walk forward/backward with coordinating arms * Ai Chi: enclosing, uplifting, enfolding * return to walking forward/ backward with arms at side * holding rails - fig 4 stretch x 10s x 2 each; L lumbar stretch x 5 sec x 3  Pt requires the buoyancy and hydrostatic pressure of water for support, and to offload joints by unweighting joint load by at least 50 % in navel deep water and by at least 75-80% in chest to neck deep water.  Viscosity of the water is needed for resistance of strengthening. Water current perturbations provides challenge to standing balance requiring increased core activation.  PATIENT EDUCATION:  Education details: aquatic progressions  Person educated: Patient Education method: Explanation, Demonstration, Tactile cues, Verbal cues Education comprehension: verbalized understanding, returned demonstration,  verbal cues required, and tactile cues required   HOME EXERCISE PROGRAM: Access Code: [email or web address] URL: [email or web address] Date: 10/31/2021 Prepared by: Daleen Bo  Aquatic:  Access Code: [email or web address] URL: [email or web address] Date: 11/17/2021 Prepared by: [place]  Exercises - Forward Walking  - 1 x daily - 7 x weekly - 3 sets - Side Stepping  - 1 x daily - 7 x weekly - 3 sets - Standing Row with Anchored Resistance  - 1 x daily - 7 x weekly - 1 sets - 10 reps - Standing 3-way leg kick holding noodle  - 1 x daily - 7 x weekly - 1 sets - 10 reps - Single Leg Stance Clamshell  - 1 x daily - 7 x weekly - 1 sets - 10 reps - Heel Toe Raises at [place]  - 1 x daily - 7 x weekly - 1 sets - 10 reps - Seated Straddle on Flotation Forward Breast Stroke Arms and Bicycle Legs  - 1 x daily - 7 x weekly - 3 sets - 10 reps - Interior Ski with Foam Dumbbells and Ankle Floats  - 1 x daily - 7 x weekly - 3 sets - 10 reps  ASSESSMENT:  CLINICAL IMPRESSION:.  Pt arrived with elevated pain level.  She reported reduction of overall pain when exercising in the water. She had some increase in RUE (elbow/wrist/hand) with gripping R hand float and wall; reduced with Ai Chi UE movements.  Pt is progressing gradually towards established goals. She will continue to benefit from skilled PT intervention aquatic and future land based to be safe and indep with all functional mobility.   OBJECTIVE IMPAIRMENTS Abnormal gait, decreased activity tolerance, decreased balance, decreased coordination, decreased knowledge of condition, decreased mobility, difficulty walking, decreased ROM, decreased strength, hypomobility, increased muscle spasms, impaired flexibility, improper body mechanics, postural dysfunction, and pain.   ACTIVITY LIMITATIONS carrying, lifting, bending, sitting, squatting, sleeping, stairs, transfers, and locomotion  level  PARTICIPATION LIMITATIONS: cleaning, interpersonal relationship, driving, shopping, community activity, occupation, and exercise  PERSONAL FACTORS Age, Behavior pattern, Fitness, Time since onset of injury/illness/exacerbation, social, and 3+ comorbidities:  are also affecting patient's functional outcome.   REHAB POTENTIAL: Fair    CLINICAL DECISION MAKING: Evolving/moderate complexity  EVALUATION COMPLEXITY: Moderate   GOALS:   SHORT TERM GOALS: Target date: 12/12/2021  Pt will become independent with HEP in order to demonstrate synthesis of PT education. Goal status: met  2.  Pt will report at least 2 pt reduction on NPRS scale for pain in order to demonstrate functional improvement with household activity, self care, and ADL.   Goal status: ongoing pain is variable  3.  Pt will demonstrate at least a 12.8 improvement in Modified Oswestry Index in order to demonstrate a clinically significant change in LBP and function.   Goal status: ongoing  4. Pt will demonstrate at least a 7.5 improvement in Neck Disability Index in order to demonstrate a clinically significant change in neck pain and function.   Goal status: INITIAL    LONG TERM GOALS: Target date: 01/23/2022  Pt  will become independent with final HEP in order to demonstrate synthesis of PT education.  Goal status: INITIAL  2.  Pt will be able to perform 5XSTS in under 12s  in order to demonstrate functional improvement above the cut  off score for adults.   Goal status: INITIAL  3.  Pt will be able to demonstrate/report ability to walk >10 mins with <3/10 pain pain in order to demonstrate functional improvement and tolerance to exercise and community mobility.   Goal status: INITIAL  4.  Pt will be able to lift//hold >15lbs in order to demonstrate functional improvement in lumbopelvic strength for return to ADL and exercise.   Goal status: INITIAL  5.  Pelvic floor goals to be set at time of  (re)evaluation  Goal status: INITIAL   PLAN: PT FREQUENCY: 1-2x/week  PT DURATION: 12 weeks (likely D/C in 8 wks)  PLANNED INTERVENTIONS: Therapeutic exercises, Therapeutic activity, Neuromuscular re-education, Balance training, Gait training, Patient/Family education, Self Care, Joint mobilization, Joint manipulation, Stair training, Orthotic/Fit training, DME instructions, Aquatic Therapy, Dry Needling, Electrical stimulation, Spinal manipulation, Spinal mobilization, Cryotherapy, Moist heat, scar mobilization, Taping, Vasopneumatic device, Traction, Ultrasound, Biofeedback, Ionotophoresis 16m/ml Dexamethasone, Manual therapy, and Re-evaluation.  PLAN FOR NEXT SESSION:  aquatic therapy: gentle UE motion, light hip and lumbar mobility; chronic pain relief  JKerin Perna PTA 12/15/21 3:25 PM [facility] [address]al[address] [place] [address]Phone: [phone]  Fax:  [phone]

## 2021-12-16 ENCOUNTER — Telehealth: Payer: Self-pay

## 2021-12-16 ENCOUNTER — Ambulatory Visit (HOSPITAL_COMMUNITY): Payer: Medicaid Other | Admitting: Anesthesiology

## 2021-12-16 ENCOUNTER — Ambulatory Visit (HOSPITAL_BASED_OUTPATIENT_CLINIC_OR_DEPARTMENT_OTHER): Payer: Medicaid Other | Admitting: Anesthesiology

## 2021-12-16 ENCOUNTER — Encounter (HOSPITAL_COMMUNITY): Payer: Self-pay

## 2021-12-16 ENCOUNTER — Ambulatory Visit (HOSPITAL_COMMUNITY)
Admission: RE | Admit: 2021-12-16 | Discharge: 2021-12-16 | Disposition: A | Discharge status: Home / Self Care | Payer: Medicaid Other | Attending: Internal Medicine | Admitting: Internal Medicine

## 2021-12-16 ENCOUNTER — Encounter (HOSPITAL_COMMUNITY)
Admission: RE | Disposition: A | Discharge status: Home / Self Care | Payer: Self-pay | Source: Home / Self Care | Attending: Internal Medicine

## 2021-12-16 DIAGNOSIS — K449 Diaphragmatic hernia without obstruction or gangrene: Secondary | ICD-10-CM | POA: Insufficient documentation

## 2021-12-16 DIAGNOSIS — K2289 Other specified disease of esophagus: Secondary | ICD-10-CM | POA: Insufficient documentation

## 2021-12-16 DIAGNOSIS — F17210 Nicotine dependence, cigarettes, uncomplicated: Secondary | ICD-10-CM | POA: Insufficient documentation

## 2021-12-16 DIAGNOSIS — R109 Unspecified abdominal pain: Secondary | ICD-10-CM

## 2021-12-16 DIAGNOSIS — K5900 Constipation, unspecified: Secondary | ICD-10-CM | POA: Diagnosis not present

## 2021-12-16 DIAGNOSIS — K2970 Gastritis, unspecified, without bleeding: Secondary | ICD-10-CM | POA: Diagnosis not present

## 2021-12-16 DIAGNOSIS — R12 Heartburn: Secondary | ICD-10-CM | POA: Diagnosis present

## 2021-12-16 DIAGNOSIS — K5730 Diverticulosis of large intestine without perforation or abscess without bleeding: Secondary | ICD-10-CM | POA: Insufficient documentation

## 2021-12-16 DIAGNOSIS — K219 Gastro-esophageal reflux disease without esophagitis: Secondary | ICD-10-CM | POA: Insufficient documentation

## 2021-12-16 DIAGNOSIS — G709 Myoneural disorder, unspecified: Secondary | ICD-10-CM | POA: Insufficient documentation

## 2021-12-16 DIAGNOSIS — Z8719 Personal history of other diseases of the digestive system: Secondary | ICD-10-CM | POA: Diagnosis not present

## 2021-12-16 DIAGNOSIS — F17200 Nicotine dependence, unspecified, uncomplicated: Secondary | ICD-10-CM | POA: Diagnosis not present

## 2021-12-16 DIAGNOSIS — R1030 Lower abdominal pain, unspecified: Secondary | ICD-10-CM | POA: Diagnosis not present

## 2021-12-16 HISTORY — PX: BIOPSY: SHX5522

## 2021-12-16 HISTORY — PX: ESOPHAGOGASTRODUODENOSCOPY (EGD) WITH PROPOFOL: SHX5813

## 2021-12-16 HISTORY — PX: COLONOSCOPY WITH PROPOFOL: SHX5780

## 2021-12-16 SURGERY — COLONOSCOPY WITH PROPOFOL
Anesthesia: General

## 2021-12-16 MED ORDER — PROPOFOL 10 MG/ML IV BOLUS
INTRAVENOUS | Status: DC | PRN
  Administered 2021-12-16: 100 mg via INTRAVENOUS

## 2021-12-16 MED ORDER — LACTATED RINGERS IV SOLN: INTRAVENOUS | Status: DC

## 2021-12-16 MED ORDER — PROPOFOL 500 MG/50ML IV EMUL
INTRAVENOUS | Status: DC | PRN
  Administered 2021-12-16: 150 ug/kg/min via INTRAVENOUS

## 2021-12-16 MED ORDER — LIDOCAINE HCL (CARDIAC) PF 100 MG/5ML IV SOSY
PREFILLED_SYRINGE | INTRAVENOUS | Status: DC | PRN
  Administered 2021-12-16: 50 mg via INTRAVENOUS

## 2021-12-16 MED ORDER — PANTOPRAZOLE SODIUM 40 MG PO TBEC
40.0000 mg | DELAYED_RELEASE_TABLET | Freq: Every day | ORAL | 11 refills | Status: DC
  Filled 2021-12-22: qty 30, 30d supply, fill #0
  Filled 2022-01-17: qty 30, 30d supply, fill #1

## 2021-12-16 MED ORDER — ONDANSETRON HCL 4 MG/2ML IJ SOLN
INTRAMUSCULAR | Status: AC
  Filled 2021-12-16: qty 2

## 2021-12-16 MED ORDER — ONDANSETRON HCL 4 MG/2ML IJ SOLN
4.0000 mg | Freq: Once | INTRAMUSCULAR | Status: AC
  Administered 2021-12-16: 4 mg via INTRAVENOUS

## 2021-12-16 NOTE — Transfer of Care (Signed)
Immediate Anesthesia Transfer of Care Note  Patient: Meghan Carter  Procedure(s) Performed: COLONOSCOPY WITH PROPOFOL ESOPHAGOGASTRODUODENOSCOPY (EGD) WITH PROPOFOL BIOPSY  Patient Location: Short Stay  Anesthesia Type:General  Level of Consciousness: awake  Airway & Oxygen Therapy: Patient Spontanous Breathing  Post-op Assessment: Report given to RN and Post -op Vital signs reviewed and stable  Post vital signs: Reviewed and stable  Last Vitals:  Vitals Value Taken Time  BP 100/64 12/16/21 1400  Temp 37 C 12/16/21 1341  Pulse 97 12/16/21 1341  Resp 17 12/16/21 1341  SpO2 94 % 12/16/21 1341    Last Pain:  Vitals:   12/16/21 1357  TempSrc:   PainSc: 0-No pain      Patients Stated Pain Goal: 7 (53/96/72 8979)  Complications: No notable events documented.

## 2021-12-16 NOTE — Discharge Instructions (Signed)
EGD Discharge instructions Please read the instructions outlined below and refer to this sheet in the next few weeks. These discharge instructions provide you with general information on caring for yourself after you leave the hospital. Your doctor may also give you specific instructions. While your treatment has been planned according to the most current medical practices available, unavoidable complications occasionally occur. If you have any problems or questions after discharge, please call your doctor. ACTIVITY You may resume your regular activity but move at a slower pace for the next 24 hours.  Take frequent rest periods for the next 24 hours.  Walking will help expel (get rid of) the air and reduce the bloated feeling in your abdomen.  No driving for 24 hours (because of the anesthesia (medicine) used during the test).  You may shower.  Do not sign any important legal documents or operate any machinery for 24 hours (because of the anesthesia used during the test).  NUTRITION Drink plenty of fluids.  You may resume your normal diet.  Begin with a light meal and progress to your normal diet.  Avoid alcoholic beverages for 24 hours or as instructed by your caregiver.  MEDICATIONS You may resume your normal medications unless your caregiver tells you otherwise.  WHAT YOU CAN EXPECT TODAY You may experience abdominal discomfort such as a feeling of fullness or "gas" pains.  FOLLOW-UP Your doctor will discuss the results of your test with you.  SEEK IMMEDIATE MEDICAL ATTENTION IF ANY OF THE FOLLOWING OCCUR: Excessive nausea (feeling sick to your stomach) and/or vomiting.  Severe abdominal pain and distention (swelling).  Trouble swallowing.  Temperature over 101 F (37.8 C).  Rectal bleeding or vomiting of blood.      Colonoscopy Discharge Instructions  Read the instructions outlined below and refer to this sheet in the next few weeks. These discharge instructions provide you  with general information on caring for yourself after you leave the hospital. Your doctor may also give you specific instructions. While your treatment has been planned according to the most current medical practices available, unavoidable complications occasionally occur.   ACTIVITY You may resume your regular activity, but move at a slower pace for the next 24 hours.  Take frequent rest periods for the next 24 hours.  Walking will help get rid of the air and reduce the bloated feeling in your belly (abdomen).  No driving for 24 hours (because of the medicine (anesthesia) used during the test).   Do not sign any important legal documents or operate any machinery for 24 hours (because of the anesthesia used during the test).  NUTRITION Drink plenty of fluids.  You may resume your normal diet as instructed by your doctor.  Begin with a light meal and progress to your normal diet. Heavy or fried foods are harder to digest and may make you feel sick to your stomach (nauseated).  Avoid alcoholic beverages for 24 hours or as instructed.  MEDICATIONS You may resume your normal medications unless your doctor tells you otherwise.  WHAT YOU CAN EXPECT TODAY Some feelings of bloating in the abdomen.  Passage of more gas than usual.  Spotting of blood in your stool or on the toilet paper.  IF YOU HAD POLYPS REMOVED DURING THE COLONOSCOPY: No aspirin products for 7 days or as instructed.  No alcohol for 7 days or as instructed.  Eat a soft diet for the next 24 hours.  FINDING OUT THE RESULTS OF YOUR TEST Not all test results  are available during your visit. If your test results are not back during the visit, make an appointment with your caregiver to find out the results. Do not assume everything is normal if you have not heard from your caregiver or the medical facility. It is important for you to follow up on all of your test results.  SEEK IMMEDIATE MEDICAL ATTENTION IF: You have more than a  spotting of blood in your stool.  Your belly is swollen (abdominal distention).  You are nauseated or vomiting.  You have a temperature over 101.  You have abdominal pain or discomfort that is severe or gets worse throughout the day.   Your upper endoscopy revealed a small hiatal hernia.  The Z-line which is the junction between your esophagus and stomach appeared slightly abnormal.  This could be due to acid reflux.  I took biopsies of this area.  You also had mild amount inflammation in your stomach which I also took biopsies to rule out infection with a bacteria called H. pylori.  Your small bowel appeared normal.  I am going to start you on a new medication called pantoprazole 40 mg daily.  This medication works best if you take it 30 minutes before you eat breakfast.  Overall your colon looked very healthy.  No polyps or evidence of colon cancer.  The terminal ileum which is the end portion of your small bowel also appeared normal.  You do not have hemorrhoids.  I did see scars from your previous surgery however.  These look good.  Repeat colonoscopy in 10 years for screening purposes.  Follow-up with GI in 3 months.  I hope you have a great rest of your week!  Elon Alas. Abbey Chatters, D.O. Gastroenterology and Hepatology [facility]

## 2021-12-16 NOTE — Interval H&P Note (Signed)
History and Physical Interval Note:  12/16/2021 12:42 PM  Meghan Carter  has presented today for surgery, with the diagnosis of bloating, constipation, gerd, abdominal pain, hiatal hernia.  The various methods of treatment have been discussed with the patient and family. After consideration of risks, benefits and other options for treatment, the patient has consented to  Procedure(s) with comments: COLONOSCOPY WITH PROPOFOL (N/A) - 1:15pm, asa 3 ESOPHAGOGASTRODUODENOSCOPY (EGD) WITH PROPOFOL (N/A) as a surgical intervention.  The patient's history has been reviewed, patient examined, no change in status, stable for surgery.  I have reviewed the patient's chart and labs.  Questions were answered to the patient's satisfaction.     Eloise Harman

## 2021-12-16 NOTE — Telephone Encounter (Signed)
Returned the pt's call from vm and Tammy R. No ans. [id number]

## 2021-12-16 NOTE — Anesthesia Preprocedure Evaluation (Addendum)
Anesthesia Evaluation  Patient identified by MRN, date of birth, ID band Patient awake    Reviewed: Allergy & Precautions, H&P , NPO status , Patient's Chart, lab work & pertinent test results  Airway Mallampati: III  TM Distance: >3 FB Neck ROM: Full    Dental  (+) Teeth Intact, Dental Advisory Given   Pulmonary Current Smoker   Pulmonary exam normal breath sounds clear to auscultation       Cardiovascular Exercise Tolerance: Good negative cardio ROS Normal cardiovascular exam Rhythm:Regular Rate:Normal     Neuro/Psych  Headaches PSYCHIATRIC DISORDERS Anxiety Depression     Neuromuscular disease    GI/Hepatic Neg liver ROS,GERD  Medicated and Controlled,,  Endo/Other  negative endocrine ROS    Renal/GU Renal diseasenegative Renal ROS  negative genitourinary   Musculoskeletal negative musculoskeletal ROS (+)    Abdominal   Peds negative pediatric ROS (+)  Hematology  (+) Blood dyscrasia, anemia   Anesthesia Other Findings   Reproductive/Obstetrics negative OB ROS                              Anesthesia Physical Anesthesia Plan  ASA: 2  Anesthesia Plan: General   Post-op Pain Management: Minimal or no pain anticipated   Induction: Intravenous  PONV Risk Score and Plan: 1 and Propofol infusion  Airway Management Planned: Nasal Cannula and Natural Airway  Additional Equipment:   Intra-op Plan:   Post-operative Plan:   Informed Consent: I have reviewed the patients History and Physical, chart, labs and discussed the procedure including the risks, benefits and alternatives for the proposed anesthesia with the patient or authorized representative who has indicated his/her understanding and acceptance.     Dental advisory given  Plan Discussed with: CRNA and Surgeon  Anesthesia Plan Comments:         Anesthesia Quick Evaluation

## 2021-12-16 NOTE — Op Note (Signed)
[facility] Patient Name: Meghan Carter Procedure Date: 12/16/2021 1:09 PM MRN: [id number] Date of Birth: Sep 20, 1980 Attending MD: Elon Alas. Abbey Chatters , [place], [id number] CSN: [id number] Age: 41 Admit Type: Outpatient Procedure:                Colonoscopy Indications:              Lower abdominal pain, Constipation Providers:                Elon Alas. Abbey Chatters, DO, Caprice Kluver, Casimer Bilis, Technician Referring MD:              Medicines:                See the Anesthesia note for documentation of the                            administered medications Complications:            No immediate complications. Estimated Blood Loss:     Estimated blood loss: none. Procedure:                Pre-Anesthesia Assessment:                           - The anesthesia plan was to use monitored                            anesthesia care (MAC).                           After obtaining informed consent, the colonoscope                            was passed under direct vision. Throughout the                            procedure, the patient's blood pressure, pulse, and                            oxygen saturations were monitored continuously. The                            [id number]-HQ190L ([id number]) scope was introduced through                            the anus and advanced to the the terminal ileum,                            with identification of the appendiceal orifice and                            IC valve. The colonoscopy was performed without                            difficulty. The patient tolerated the procedure  well. The quality of the bowel preparation was                            evaluated using the BBPS [facility] Bowel Preparation                            Scale) with scores of: Right Colon = 3, Transverse                            Colon = 3 and Left Colon = 3 (entire mucosa seen                            well with no residual  staining, small fragments of                            stool or opaque liquid). The total BBPS score                            equals 9. Scope In: 1:30:11 PM Scope Out: 1:38:12 PM Scope Withdrawal Time: 0 hours 6 minutes 14 seconds  Total Procedure Duration: 0 hours 8 minutes 1 second  Findings:      The perianal and digital rectal examinations were normal.      A few small-mouthed diverticula were found in the sigmoid colon and       ascending colon.      The terminal ileum appeared normal.      The entire examined colon appeared normal.      Previous hemorrhoid surgery scars Impression:               - Diverticulosis in the sigmoid colon and in the                            ascending colon.                           - The examined portion of the ileum was normal.                           - The entire examined colon is normal.                           Previous hemorrhoid surgery scars                           - No specimens collected. Moderate Sedation:      Per Anesthesia Care Recommendation:           - Patient has a contact number available for                            emergencies. The signs and symptoms of potential                            delayed complications were discussed with the  patient. Return to normal activities tomorrow.                            Written discharge instructions were provided to the                            patient.                           - Resume previous diet.                           - Continue present medications.                           - Repeat colonoscopy in 10 years for screening                            purposes.                           - Return to GI clinic in 3 months. Procedure Code(s):        --- Professional ---                           [phone], Colonoscopy, flexible; diagnostic, including                            collection of specimen(s) by brushing or washing,                             when performed (separate procedure) Diagnosis Code(s):        --- Professional ---                           R10.30, Lower abdominal pain, unspecified                           K59.00, Constipation, unspecified                           K57.30, Diverticulosis of large intestine without                            perforation or abscess without bleeding CPT copyright 2022 American Medical Association. All rights reserved. The codes documented in this report are preliminary and upon coder review may  be revised to meet current compliance requirements. Elon Alas. Abbey Chatters, DO [place] Abbey Chatters, DO 12/16/2021 1:41:36 PM This report has been signed electronically. Number of Addenda: 0

## 2021-12-16 NOTE — Anesthesia Postprocedure Evaluation (Signed)
Anesthesia Post Note  Patient: Meghan Carter  Procedure(s) Performed: COLONOSCOPY WITH PROPOFOL ESOPHAGOGASTRODUODENOSCOPY (EGD) WITH PROPOFOL BIOPSY  Patient location during evaluation: Phase II Anesthesia Type: General Level of consciousness: awake and alert and oriented Pain management: pain level controlled Vital Signs Assessment: post-procedure vital signs reviewed and stable Respiratory status: spontaneous breathing, nonlabored ventilation and respiratory function stable Cardiovascular status: blood pressure returned to baseline and stable Postop Assessment: no apparent nausea or vomiting Anesthetic complications: no  No notable events documented.   Last Vitals:  Vitals:   12/16/21 1346 12/16/21 1400  BP: 92/67 100/64  Pulse:    Resp:    Temp:    SpO2:      Last Pain:  Vitals:   12/16/21 1357  TempSrc:   PainSc: 0-No pain                 Simpson Paulos C Kamala Kolton

## 2021-12-16 NOTE — Op Note (Signed)
[facility] Patient Name: Meghan Carter Procedure Date: 12/16/2021 1:10 PM MRN: [id number] Date of Birth: 1981-01-13 Attending MD: Elon Alas. Abbey Chatters , [place], [id number] CSN: [id number] Age: 41 Admit Type: Outpatient Procedure:                Upper GI endoscopy Indications:              Epigastric abdominal pain, Heartburn Providers:                Elon Alas. Abbey Chatters, DO, Caprice Kluver, Casimer Bilis, Technician Referring MD:              Medicines:                See the Anesthesia note for documentation of the                            administered medications Complications:            No immediate complications. Estimated Blood Loss:     Estimated blood loss was minimal. Procedure:                Pre-Anesthesia Assessment:                           - The anesthesia plan was to use monitored                            anesthesia care (MAC).                           After obtaining informed consent, the endoscope was                            passed under direct vision. Throughout the                            procedure, the patient's blood pressure, pulse, and                            oxygen saturations were monitored continuously. The                            [id number]-H190 ([id number]) scope was introduced through the                            mouth, and advanced to the second part of duodenum.                            The upper GI endoscopy was accomplished without                            difficulty. The patient tolerated the procedure                            well. Scope In:  1:22:10 PM Scope Out: 1:26:17 PM Total Procedure Duration: 0 hours 4 minutes 7 seconds  Findings:      The Z-line was variable and was found 38 cm from the incisors. Biopsies       were taken with a cold forceps for histology.      A small hiatal hernia was present.      Patchy mild inflammation characterized by erythema was found in the       gastric body and in  the gastric antrum. Biopsies were taken with a cold       forceps for Helicobacter pylori testing.      The duodenal bulb, first portion of the duodenum and second portion of       the duodenum were normal. Impression:               - Z-line variable, 38 cm from the incisors.                            Biopsied.                           - Small hiatal hernia.                           - Gastritis. Biopsied.                           - Normal duodenal bulb, first portion of the                            duodenum and second portion of the duodenum. Moderate Sedation:      Per Anesthesia Care Recommendation:           - Patient has a contact number available for                            emergencies. The signs and symptoms of potential                            delayed complications were discussed with the                            patient. Return to normal activities tomorrow.                            Written discharge instructions were provided to the                            patient.                           - Resume previous diet.                           - Continue present medications.                           - Await pathology results.                           -  Use Protonix (pantoprazole) 40 mg PO daily.                           - No ibuprofen, naproxen, or other non-steroidal                            anti-inflammatory drugs.                           - Return to GI clinic in 3 months. Procedure Code(s):        --- Professional ---                           [phone], Esophagogastroduodenoscopy, flexible,                            transoral; with biopsy, single or multiple Diagnosis Code(s):        --- Professional ---                           K22.89, Other specified disease of esophagus                           K44.9, Diaphragmatic hernia without obstruction or                            gangrene                           K29.70, Gastritis, unspecified, without  bleeding                           R10.13, Epigastric pain                           R12, Heartburn CPT copyright 2022 American Medical Association. All rights reserved. The codes documented in this report are preliminary and upon coder review may  be revised to meet current compliance requirements. Elon Alas. Abbey Chatters, DO [place] Abbey Chatters, DO 12/16/2021 1:29:07 PM This report has been signed electronically. Number of Addenda: 0

## 2021-12-20 ENCOUNTER — Ambulatory Visit (HOSPITAL_BASED_OUTPATIENT_CLINIC_OR_DEPARTMENT_OTHER): Payer: Medicaid Other | Admitting: Physical Therapy

## 2021-12-20 ENCOUNTER — Telehealth: Payer: Self-pay

## 2021-12-20 ENCOUNTER — Encounter (HOSPITAL_BASED_OUTPATIENT_CLINIC_OR_DEPARTMENT_OTHER): Payer: Self-pay | Admitting: Physical Therapy

## 2021-12-20 LAB — SURGICAL PATHOLOGY

## 2021-12-20 NOTE — Telephone Encounter (Signed)
Client called stating her appointment today at [facility] cancelled. She attempted to call [facility] but they told her [facility] would still need to call them. *called [facility] to cancel today's transportation and added that to expense tracker*  Client states she is feeling a little under the weather from her procedure last week. She is going to rest. She plans on going to other appointments this week and will come in to pick up her [employer] gas cards for her transportation by Thursday. She has transportation with [facility] for 12/26/21 that she plans to utilize.  No further needs that we can assist with today.  [place] [employer]

## 2021-12-22 ENCOUNTER — Other Ambulatory Visit (HOSPITAL_BASED_OUTPATIENT_CLINIC_OR_DEPARTMENT_OTHER): Payer: Self-pay

## 2021-12-22 ENCOUNTER — Encounter (HOSPITAL_BASED_OUTPATIENT_CLINIC_OR_DEPARTMENT_OTHER): Payer: Self-pay | Admitting: Physical Therapy

## 2021-12-22 ENCOUNTER — Ambulatory Visit (HOSPITAL_BASED_OUTPATIENT_CLINIC_OR_DEPARTMENT_OTHER): Payer: Medicaid Other | Admitting: Physical Therapy

## 2021-12-22 DIAGNOSIS — M5459 Other low back pain: Secondary | ICD-10-CM

## 2021-12-22 DIAGNOSIS — M6281 Muscle weakness (generalized): Secondary | ICD-10-CM | POA: Diagnosis not present

## 2021-12-22 DIAGNOSIS — M62838 Other muscle spasm: Secondary | ICD-10-CM | POA: Diagnosis not present

## 2021-12-22 DIAGNOSIS — M542 Cervicalgia: Secondary | ICD-10-CM | POA: Diagnosis not present

## 2021-12-22 NOTE — Therapy (Signed)
OUTPATIENT PHYSICAL THERAPY THORACOLUMBAR TREATMENT   Patient Name: Meghan Carter MRN: [id number] DOB:1980/03/19, 41 y.o., female Today's Date: 12/22/2021   PT End of Session - 12/22/21 1100     Visit Number 11    Number of Visits 18    Date for PT Re-Evaluation 01/29/22    Authorization Type [employer]    PT Start Time 1400    PT Stop Time 5885    PT Time Calculation (min) 45 min    Activity Tolerance Patient tolerated treatment well    Behavior During Therapy [facility] for tasks assessed/performed               Past Medical History:  Diagnosis Date   Acute blood loss anemia 03/26/2015   Anemia    Anxiety    Atherosclerosis of arteries    Blood transfusion without reported diagnosis 2017   2 units, no reaction   Callus of foot 02/08/2011   Chronic kidney disease    Depression    Dysmenorrhea    Endometrial polyp 02/11/2014   Fecal occult blood test positive 01/27/2014   Fecal occult blood test positive 01/27/2014   Fibroid    High-tone pelvic floor dysfunction 03/17/2015   History of abnormal cervical Pap smear 02/11/2014   History of acute pancreatitis 03/31/2011   HSV (herpes simplex virus) infection    Hydrosalpinx    Bilateral   Internal hemorrhoids    Lichenification and lichen simplex chronicus 11/07/2010   Lower abdominal pain    Menorrhagia with regular cycle 01/27/2014   Menorrhagia with regular cycle 01/27/2014   Other fatigue    Pelvic pain in female 01/27/2014   Plantar fasciitis 02/08/2011   Prediabetes    Rectal bleeding 01/27/2014   Rectal pain 03/19/2014   Severe anemia    Tinnitus    Vaginal Pap smear, abnormal    Past Surgical History:  Procedure Laterality Date   CHROMOPERTUBATION  08/14/2017   Procedure: CHROMOPERTUBATION;  Surgeon: Governor Specking, MD;  Location: [facility];  Service: Gynecology;;   COLONOSCOPY N/A 03/23/2014   SLF: 1. The examined terminal ileum appeared to be normal. 2. The left colon is redundant 3. Moderate sized internal  hemorrhoids   ECTOPIC PREGNANCY SURGERY Left 2005   FLEXIBLE SIGMOIDOSCOPY N/A 04/01/2015   SLF: rectal bleeding due to large internal hemorrhoids 2. anemia due to rectal bleeding/memses/ poor iron intake.   FLEXIBLE SIGMOIDOSCOPY N/A 03/27/2015   Procedure: FLEXIBLE SIGMOIDOSCOPY;  Surgeon: Danie Binder, MD;  Location: [facility];  Service: Endoscopy;  Laterality: N/A;   HEMORRHOID BANDING N/A 03/23/2014   Procedure: HEMORRHOID BANDING;  Surgeon: Danie Binder, MD;  Location: [facility];  Service: Endoscopy;  Laterality: N/A;   HEMORRHOID BANDING  03/27/2015   Procedure: HEMORRHOID BANDING;  Surgeon: Danie Binder, MD;  Location: [facility];  Service: Endoscopy;;   HEMORRHOID SURGERY N/A 01/11/2018   Procedure: EXTENSIVE HEMORRHOIDECTOMY;  Surgeon: Aviva Signs, MD;  Location: [facility];  Service: General;  Laterality: N/A;   HYSTEROSCOPY WITH D & C N/A 06/16/2014   Procedure: DILATATION AND CURETTAGE /HYSTEROSCOPY;  Surgeon: Jonnie Kind, MD;  Location: [facility];  Service: Gynecology;  Laterality: N/A;   LAPAROSCOPIC UNILATERAL SALPINGECTOMY Right 06/16/2014   Procedure: LAPAROSCOPIC UNILATERAL SALPINGECTOMY;  Surgeon: Jonnie Kind, MD;  Location: [facility];  Service: Gynecology;  Laterality: Right;   LAPAROSCOPY N/A 08/14/2017   Procedure: OPERATIVE LAPAROSCOPY ,LEFT SALPINGECTOMY, LYSIS OF ADHESIONS;  Surgeon: Governor Specking, MD;  Location: [facility];  Service: Gynecology;  Laterality: N/A;   POLYPECTOMY N/A 06/16/2014   Procedure: POLYPECTOMY (endometrial);  Surgeon: Jonnie Kind, MD;  Location: [facility];  Service: Gynecology;  Laterality: N/A;   Patient Active Problem List   Diagnosis Date Noted   Lichen planus 31/49/7026   Spine pain, cervical 01/25/2021   Acute post-traumatic headache, not intractable 01/25/2021   Encounter for support and coordination of transition of care 01/25/2021   Leiomyoma 12/29/2020   Morbid obesity ([place]) 12/29/2020   Enlarged thyroid 06/29/2020    Constipation 06/29/2020   Annual physical exam 01/23/2019   Recurrent major depressive disorder, in partial remission ([place]) 01/14/2019   Depression, major, single episode, moderate ([place]) 11/19/2018   Anxiety 11/19/2018   Herpes simplex type 2 infection 01/24/2018   Internal and external bleeding hemorrhoids    History of abnormal cervical Pap smear 02/11/2014   Hemorrhoids, internal 01/27/2014   Nondependent alcohol abuse, episodic drinking behavior 03/31/2011    PCP: [facility], Trenton Gammon, NP  REFERRING PROVIDER: Carole Civil, MD  Porfirio Mylar, FNP (pelvic referral)  REFERRING DIAG:  M50.30 (ICD-10-CM) - DDD (degenerative disc disease), cervical  M51.37 (ICD-10-CM) - DDD (degenerative disc disease), lumbosacral  M54.50,G89.29 (ICD-10-CM) - Chronic midline low back pain, unspecified whether sciatica present   R10.2 (ICD-10-CM) - Pelvic and perineal pain  G89.29 (ICD-10-CM) - Other chronic pain  M54.50 (ICD-10-CM) - Low back pain, unspecified  M25.551 (ICD-10-CM) - Pain in right hip  M25.552 (ICD-10-CM) - Pain in left hip   Rationale for Evaluation and Treatment Rehabilitation  THERAPY DIAG:  Other low back pain  Muscle weakness (generalized)  Other muscle spasm  ONSET DATE: 2023  SUBJECTIVE:                                                                                                                                                                                           SUBJECTIVE STATEMENT: General pain throughout abdomin; shoulders and le  PERTINENT HISTORY:   February 07 2022 surgery- planned to be sooner; hysterectomy  3 abdominal surgeries, one for miscarriage in 2005, 2016 was cyst removal and salpingectomy Rt, Lt salpingectomy 2019 and some endometriosis.  PAIN:  Are you having pain? Yes:  NPRS scale: 10/10 Pain location:  neck, low back; Lt ankle Pain description: sharp; dull; shooting Aggravating factors: standing, sitting, cooking,  cleaning, groceries, stairs Relieving factors: resting, sitting   PRECAUTIONS: None  WEIGHT BEARING RESTRICTIONS No  FALLS:  Has patient fallen in last 6 months? No  LIVING ENVIRONMENT: Lives with: lives with their family Lives in: House/apartment Stairs: Yes Has following equipment at home: Single point  cane  OCCUPATION: on leave from work at the moment; Producer, television/film/video; climbing ladders     PLOF: Independent with basic ADLs  PATIENT GOALS :    OBJECTIVE: (findings taken at evaluation unless otherwise noted)  DIAGNOSTIC FINDINGS:  IMPRESSION: No acute fracture or dislocation. Minimal degenerative joint changes of lower thoracic spine.     Lumbar X-ray IMPRESSION: Negative.  IMPRESSION: No acute fracture or dislocation. Mild degenerative joint changes of cervical spine.  PATIENT SURVEYS:  Modified Oswestry    NDI:   SCREENING FOR RED FLAGS: Bowel or bladder incontinence: Yes: history of urge incontinence Spinal tumors: No Cauda equina syndrome: No Compression fracture: No   COGNITION:  Overall cognitive status: Within functional limits for tasks assessed     SENSATION: Light touch: WFL  POSTURE: rounded shoulders, decreased lumbar lordosis, and anterior pelvic tilt  LUMBAR ROM:  all lumbar ROM exacerbates  pelvic discomfort and pressure; held further ROM screening of lumbar and LE due to pelvic pain  Cervical ROM:   Active  A/PROM  eval   Flexion WFL   Extension WFL   Right lateral flexion 75% HA sensation   Left lateral flexion 75% HA sensation   Right rotation 75%   Left rotation 75%    (Blank rows = not tested)  LOWER EXTREMITY MMT:    Active  Right eval Left eval Right / Left  Hip flexion 4-/5 p! 4/5 4-/5  4/5 p!  Hip extension     Hip abduction 4-/5 p! 4-/5 p! 4-/5  p!  Hip adduction 4-/5 p! 4-/5 p! 4-/5p!   (Blank rows = not tested)  Grip Strength:  R 15lbs; 15,15;  L 15lbs, 20, 20   FUNCTIONAL TESTS:   STS transfers: requires single UE for standing 11/7: 5X STS: 22.6  GAIT: Distance walked: 72f Assistive device utilized: None Level of assistance: Complete Independence Comments: toe out gait, decreased hip extension, hip hiking, decreased step length    TODAY'S TREATMENT  Pt seen for aquatic therapy today.  Treatment took place in water 3.25-4.5 ft in depth at the [facility] Temp of water was 92.  Pt entered/exited the pool via stairs independently with bilat rail.   * unsupported walking forward, backward *Seated on squoodle: ant/post pelvic tilting/ hip hiking * walking forward/ backwards with row motion holding yellow noodle and rainbow hand floats * side stepping with shoulder addct with rainbow hand floats; push downs at side x 5 * TrA set with blue noodle push down * plank with hip ext with hands on bench  *seated on bench with feet on blue step - lumbar stretch with hands on kick board, repeated with lateral flexion; STS x 10  -cues to maintain neutral head/spine  * suspended by noodle between LEs : UE supported on yellow hand buoys-cycling,  holding corner: hip abdc/add, cc ski.    * monster walk forward/backward with coordinating arm *walking forward/ backward with arms at side between exercises for recovery  Pt requires the buoyancy and hydrostatic pressure of water for support, and to offload joints by unweighting joint load by at least 50 % in navel deep water and by at least 75-80% in chest to neck deep water.  Viscosity of the water is needed for resistance of strengthening. Water current perturbations provides challenge to standing balance requiring increased core activation.  PATIENT EDUCATION:  Education details: aquatic progressions  Person educated: Patient Education method: Explanation, Demonstration, TCorporate treasurercues, Verbal cues Education comprehension: verbalized understanding, returned demonstration,  verbal cues required, and tactile cues  required   HOME EXERCISE PROGRAM: Access Code: [email or web address] URL: [email or web address] Date: 10/31/2021 Prepared by: Daleen Bo  Aquatic:  Access Code: [phone] URL: [email or web address] Date: 11/17/2021 Prepared by: [facility]  Exercises - Forward Walking  - 1 x daily - 7 x weekly - 3 sets - Side Stepping  - 1 x daily - 7 x weekly - 3 sets - Standing Row with Anchored Resistance  - 1 x daily - 7 x weekly - 1 sets - 10 reps - Standing 3-way leg kick holding noodle  - 1 x daily - 7 x weekly - 1 sets - 10 reps - Single Leg Stance Clamshell  - 1 x daily - 7 x weekly - 1 sets - 10 reps - Heel Toe Raises at [place]  - 1 x daily - 7 x weekly - 1 sets - 10 reps - Seated Straddle on Flotation Forward Breast Stroke Arms and Bicycle Legs  - 1 x daily - 7 x weekly - 3 sets - 10 reps - [place] Ski with Foam Dumbbells and Ankle Floats  - 1 x daily - 7 x weekly - 3 sets - 10 reps  ASSESSMENT:  CLINICAL IMPRESSION:.  Pt with abdominal/gastric pain limiting toleration to core engagement. Unable to complete TrA contraction without discomfort. With conversation pt reports she has not been taking her protonix as she is afraid of the side effects. She is encouraged to speak with MD in regards to as it is prescribed to treat her gastroparesis.  Pt edu on gastroparesis as well as diverticulosis as she was dx with after upper and lower GI performed last week. She VU.  Tolerates all exercises with LE well. No reports of R hand pain able to grasp noodle and hand buoys and complete exercises. She is given minor VC for improved execution and improved posture. She reports decreased lb discomfort with pelvic ROM. Goals ongoing  Pt arrived with elevated pain level.  She reported reduction of overall pain when exercising in the water. She had some increase in RUE (elbow/wrist/hand) with gripping R hand float and wall; reduced with [place] UE  movements.  Pt is progressing gradually towards established goals. She will continue to benefit from skilled PT intervention aquatic and future land based to be safe and indep with all functional mobility.   OBJECTIVE IMPAIRMENTS Abnormal gait, decreased activity tolerance, decreased balance, decreased coordination, decreased knowledge of condition, decreased mobility, difficulty walking, decreased ROM, decreased strength, hypomobility, increased muscle spasms, impaired flexibility, improper body mechanics, postural dysfunction, and pain.   ACTIVITY LIMITATIONS carrying, lifting, bending, sitting, squatting, sleeping, stairs, transfers, and locomotion level  PARTICIPATION LIMITATIONS: cleaning, interpersonal relationship, driving, shopping, community activity, occupation, and exercise  PERSONAL FACTORS Age, Behavior pattern, Fitness, Time since onset of injury/illness/exacerbation, social, and 3+ comorbidities:  are also affecting patient's functional outcome.   REHAB POTENTIAL: Fair    CLINICAL DECISION MAKING: Evolving/moderate complexity  EVALUATION COMPLEXITY: Moderate   GOALS:   SHORT TERM GOALS: Target date: 12/12/2021  Pt will become independent with HEP in order to demonstrate synthesis of PT education. Goal status: met  2.  Pt will report at least 2 pt reduction on NPRS scale for pain in order to demonstrate functional improvement with household activity, self care, and ADL.   Goal status: ongoing pain is variable  3.  Pt will demonstrate at least a 12.8 improvement in Modified Oswestry Index in order  to demonstrate a clinically significant change in LBP and function.   Goal status: ongoing  4. Pt will demonstrate at least a 7.5 improvement in Neck Disability Index in order to demonstrate a clinically significant change in neck pain and function.   Goal status: INITIAL    LONG TERM GOALS: Target date: 01/23/2022  Pt  will become independent with final HEP in order to  demonstrate synthesis of PT education.  Goal status: INITIAL  2.  Pt will be able to perform 5XSTS in under 12s  in order to demonstrate functional improvement above the cut off score for adults.   Goal status: INITIAL  3.  Pt will be able to demonstrate/report ability to walk >10 mins with <3/10 pain pain in order to demonstrate functional improvement and tolerance to exercise and community mobility.   Goal status: INITIAL  4.  Pt will be able to lift//hold >15lbs in order to demonstrate functional improvement in lumbopelvic strength for return to ADL and exercise.   Goal status: INITIAL  5.  Pelvic floor goals to be set at time of (re)evaluation  Goal status: INITIAL   PLAN: PT FREQUENCY: 1-2x/week  PT DURATION: 12 weeks (likely D/C in 8 wks)  PLANNED INTERVENTIONS: Therapeutic exercises, Therapeutic activity, Neuromuscular re-education, Balance training, Gait training, Patient/Family education, Self Care, Joint mobilization, Joint manipulation, Stair training, Orthotic/Fit training, DME instructions, Aquatic Therapy, Dry Needling, Electrical stimulation, Spinal manipulation, Spinal mobilization, Cryotherapy, Moist heat, scar mobilization, Taping, Vasopneumatic device, Traction, Ultrasound, Biofeedback, Ionotophoresis 49m/ml Dexamethasone, Manual therapy, and Re-evaluation.  PLAN FOR NEXT SESSION:  aquatic therapy: gentle UE motion, light hip and lumbar mobility; chronic pain relief  MAnnamarie Major Haileyann Staiger MPT 12/22/21 3:55 PM [facility] [address] [address]Phone: [phone]  Fax:  [phone]

## 2021-12-23 ENCOUNTER — Encounter (HOSPITAL_COMMUNITY): Payer: Self-pay | Admitting: Internal Medicine

## 2021-12-23 ENCOUNTER — Telehealth: Payer: Self-pay

## 2021-12-23 NOTE — Telephone Encounter (Signed)
Noted. Mailed handouts.

## 2021-12-23 NOTE — Telephone Encounter (Signed)
Spoke with patient and gave her results from procedures. Pt is wanting more information on reflux. Wants to know ways to eliminate it without medication as well as what foods she needs to be eating. Pt just started taking pantoprazole yesterday due to being worried about the side effects.

## 2021-12-26 ENCOUNTER — Encounter (HOSPITAL_BASED_OUTPATIENT_CLINIC_OR_DEPARTMENT_OTHER): Payer: Self-pay | Admitting: Physical Therapy

## 2021-12-26 ENCOUNTER — Ambulatory Visit (HOSPITAL_BASED_OUTPATIENT_CLINIC_OR_DEPARTMENT_OTHER): Payer: Self-pay | Admitting: Physical Therapy

## 2021-12-26 ENCOUNTER — Ambulatory Visit (HOSPITAL_BASED_OUTPATIENT_CLINIC_OR_DEPARTMENT_OTHER): Payer: Medicaid Other | Admitting: Physical Therapy

## 2021-12-26 ENCOUNTER — Telehealth: Payer: Self-pay

## 2021-12-26 DIAGNOSIS — M542 Cervicalgia: Secondary | ICD-10-CM | POA: Diagnosis not present

## 2021-12-26 DIAGNOSIS — M6281 Muscle weakness (generalized): Secondary | ICD-10-CM

## 2021-12-26 DIAGNOSIS — M62838 Other muscle spasm: Secondary | ICD-10-CM | POA: Diagnosis not present

## 2021-12-26 DIAGNOSIS — M5459 Other low back pain: Secondary | ICD-10-CM | POA: Diagnosis not present

## 2021-12-26 NOTE — Therapy (Signed)
OUTPATIENT PHYSICAL THERAPY THORACOLUMBAR TREATMENT   Patient Name: Meghan Carter MRN: [id number] DOB:[birth year], 41 y.o., female Today's Date: 12/26/2021   PT End of Session - 12/26/21 1330     Visit Number 12    Number of Visits 18    Date for PT Re-Evaluation 01/29/22    Authorization Type [employer]    PT Start Time 1330    PT Stop Time 1415    PT Time Calculation (min) 45 min    Activity Tolerance Patient tolerated treatment well    Behavior During Therapy [facility] for tasks assessed/performed               Past Medical History:  Diagnosis Date   Acute blood loss anemia 03/26/2015   Anemia    Anxiety    Atherosclerosis of arteries    Blood transfusion without reported diagnosis 2017   2 units, no reaction   Callus of foot 02/08/2011   Chronic kidney disease    Depression    Dysmenorrhea    Endometrial polyp 02/11/2014   Fecal occult blood test positive 01/27/2014   Fecal occult blood test positive 01/27/2014   Fibroid    High-tone pelvic floor dysfunction 03/17/2015   History of abnormal cervical Pap smear 02/11/2014   History of acute pancreatitis 03/31/2011   HSV (herpes simplex virus) infection    Hydrosalpinx    Bilateral   Internal hemorrhoids    Lichenification and lichen simplex chronicus 11/07/2010   Lower abdominal pain    Menorrhagia with regular cycle 01/27/2014   Menorrhagia with regular cycle 01/27/2014   Other fatigue    Pelvic pain in female 01/27/2014   Plantar fasciitis 02/08/2011   Prediabetes    Rectal bleeding 01/27/2014   Rectal pain 03/19/2014   Severe anemia    Tinnitus    Vaginal Pap smear, abnormal    Past Surgical History:  Procedure Laterality Date   BIOPSY  12/16/2021   Procedure: BIOPSY;  Surgeon: Eloise Harman, DO;  [facility];  Service: Endoscopy;;   CHROMOPERTUBATION  08/14/2017   Procedure: CHROMOPERTUBATION;  Surgeon: Governor Specking, MD;  Location: [facility];  Service: Gynecology;;   COLONOSCOPY N/A  03/23/2014   SLF: 1. The examined terminal ileum appeared to be normal. 2. The left colon is redundant 3. Moderate sized internal hemorrhoids   COLONOSCOPY WITH PROPOFOL N/A 12/16/2021   Procedure: COLONOSCOPY WITH PROPOFOL;  Surgeon: Eloise Harman, DO;  [facility];  Service: Endoscopy;  Laterality: N/A;  1:15pm, asa 3   ECTOPIC PREGNANCY SURGERY Left 2005   ESOPHAGOGASTRODUODENOSCOPY (EGD) WITH PROPOFOL N/A 12/16/2021   Procedure: ESOPHAGOGASTRODUODENOSCOPY (EGD) WITH PROPOFOL;  Surgeon: Eloise Harman, DO;  [facility];  Service: Endoscopy;  Laterality: N/A;   FLEXIBLE SIGMOIDOSCOPY N/A 04/01/2015   SLF: rectal bleeding due to large internal hemorrhoids 2. anemia due to rectal bleeding/memses/ poor iron intake.   FLEXIBLE SIGMOIDOSCOPY N/A 03/27/2015   Procedure: FLEXIBLE SIGMOIDOSCOPY;  Surgeon: Danie Binder, MD;  [facility];  Service: Endoscopy;  Laterality: N/A;   HEMORRHOID BANDING N/A 03/23/2014   Procedure: HEMORRHOID BANDING;  Surgeon: Danie Binder, MD;  [facility];  Service: Endoscopy;  Laterality: N/A;   HEMORRHOID BANDING  03/27/2015   Procedure: HEMORRHOID BANDING;  Surgeon: Danie Binder, MD;  [facility];  Service: Endoscopy;;   HEMORRHOID SURGERY N/A 01/11/2018   Procedure: EXTENSIVE HEMORRHOIDECTOMY;  Surgeon: Aviva Signs, MD;  Location: [facility];  Service: General;  Laterality: N/A;   HYSTEROSCOPY WITH D & C N/A 06/16/2014   Procedure: DILATATION AND CURETTAGE /HYSTEROSCOPY;  Surgeon: Jonnie Kind, MD;  Location[facility];  Service: Gynecology;  Laterality: N/A;   LAPAROSCOPIC UNILATERAL SALPINGECTOMY Right 06/16/2014   Procedure: LAPAROSCOPIC UNILATERAL SALPINGECTOMY;  Surgeon: Jonnie Kind, MD;  Location[facility];  Service: Gynecology;  Laterality: Right;   LAPAROSCOPY N/A 08/14/2017   Procedure: OPERATIVE LAPAROSCOPY ,LEFT SALPINGECTOMY, LYSIS OF ADHESIONS;  Surgeon: Governor Specking, MD;  [facility];  Service: Gynecology;  Laterality: N/A;   POLYPECTOMY N/A 06/16/2014   Procedure: POLYPECTOMY (endometrial);  Surgeon: Jonnie Kind, MD;  Location[facility];  Service: Gynecology;  Laterality: N/A;   Patient Active Problem List   Diagnosis Date Noted   Lichen planus 70/78/6754   Spine pain, cervical 01/25/2021   Acute post-traumatic headache, not intractable 01/25/2021   Encounter for support and coordination of transition of care 01/25/2021   Leiomyoma 12/29/2020   Morbid obesity ([place]) 12/29/2020   Enlarged thyroid 06/29/2020   Constipation 06/29/2020   Annual physical exam 01/23/2019   Recurrent major depressive disorder, in partial remission ([place]) 01/14/2019   Depression, major, single episode, moderate ([place]) 11/19/2018   Anxiety 11/19/2018   Herpes simplex type 2 infection 01/24/2018   Internal and external bleeding hemorrhoids    History of abnormal cervical Pap smear 02/11/2014   Hemorrhoids, internal 01/27/2014   Nondependent alcohol abuse, episodic drinking behavior 03/31/2011    PCP: [facility], [place]on Gammon, NP  REFERRING PROVIDER: Carole Civil, MD  Porfirio Mylar, FNP (pelvic referral)  REFERRING DIAG:  M50.30 (ICD-10-CM) - DDD (degenerative disc disease), cervical  M51.37 (ICD-10-CM) - DDD (degenerative disc disease), lumbosacral  M54.50,G89.29 (ICD-10-CM) - Chronic midline low back pain, unspecified whether sciatica present   R10.2 (ICD-10-CM) - Pelvic and perineal pain  G89.29 (ICD-10-CM) - Other chronic pain  M54.50 (ICD-10-CM) - Low back pain, unspecified  M25.551 (ICD-10-CM) - Pain in right hip  M25.552 (ICD-10-CM) - Pain in left hip   Rationale for Evaluation and Treatment Rehabilitation  THERAPY DIAG:  Other low back pain  Muscle weakness (generalized)  Other muscle spasm  ONSET DATE: 2023  SUBJECTIVE:                                                                                                                                                                                            SUBJECTIVE STATEMENT: Doing "OK"  PERTINENT HISTORY:   February 07 2022 surgery- planned to be sooner; hysterectomy  3 abdominal surgeries, one for miscarriage in 2005, 2016 was cyst removal and salpingectomy Rt,  Lt salpingectomy 2019 and some endometriosis.  PAIN:  Are you having pain? Yes:  NPRS scale: 10/10 Pain location:  neck, low back; Lt ankle Pain description: sharp; dull; shooting Aggravating factors: standing, sitting, cooking, cleaning, groceries, stairs Relieving factors: resting, sitting   PRECAUTIONS: None  WEIGHT BEARING RESTRICTIONS No  FALLS:  Has patient fallen in last 6 months? No  LIVING ENVIRONMENT: Lives with: lives with their family Lives in: House/apartment Stairs: Yes Has following equipment at home: Single point cane  OCCUPATION: on leave from work at the moment; Producer, television/film/video; climbing ladders     PLOF: Independent with basic ADLs  PATIENT GOALS :    OBJECTIVE: (findings taken at evaluation unless otherwise noted)  DIAGNOSTIC FINDINGS:  IMPRESSION: No acute fracture or dislocation. Minimal degenerative joint changes of lower thoracic spine.     Lumbar X-ray IMPRESSION: Negative.  IMPRESSION: No acute fracture or dislocation. Mild degenerative joint changes of cervical spine.  PATIENT SURVEYS:  Modified Oswestry    NDI:   SCREENING FOR RED FLAGS: Bowel or bladder incontinence: Yes: history of urge incontinence Spinal tumors: No Cauda equina syndrome: No Compression fracture: No   COGNITION:  Overall cognitive status: Within functional limits for tasks assessed     SENSATION: Light touch: WFL  POSTURE: rounded shoulders, decreased lumbar lordosis, and anterior pelvic tilt  LUMBAR ROM:  all lumbar ROM exacerbates  pelvic discomfort and pressure; held further ROM screening of lumbar and LE due to pelvic pain  Cervical ROM:   Active  A/PROM  eval    Flexion WFL   Extension WFL   Right lateral flexion 75% HA sensation   Left lateral flexion 75% HA sensation   Right rotation 75%   Left rotation 75%    (Blank rows = not tested)  LOWER EXTREMITY MMT:    Active  Right eval Left eval Right / Left  Hip flexion 4-/5 p! 4/5 4-/5  4/5 p!  Hip extension     Hip abduction 4-/5 p! 4-/5 p! 4-/5  p!  Hip adduction 4-/5 p! 4-/5 p! 4-/5p!   (Blank rows = not tested)  Grip Strength:  R 15lbs; 15,15;  L 15lbs, 20, 20   FUNCTIONAL TESTS:  STS transfers: requires single UE for standing 11/7: 5X STS: 22.6  GAIT: Distance walked: 58f Assistive device utilized: None Level of assistance: Complete Independence Comments: toe out gait, decreased hip extension, hip hiking, decreased step length    TODAY'S TREATMENT  Pt seen for aquatic therapy today.  Treatment took place in water 3.25-4.5 ft in [facility] at the [facility] Temp of water was 92.  Pt entered/exited the pool via stairs independently with bilat rail.   * unsupported walking forward, backward *Seated on squoodle: ant/post pelvic tilting/ hip hiking * monster walk forward/backward with coordinating arm * side stepping with shoulder addct with rainbow hand floats; push downs at side x 5 * TrA set with blue sm squoodle push down 2x10. Cues to slow eccentric movement *Lumbar rotation between above ex 2x10 *seated on bench with feet on blue step - lumbar stretch with hands on kick board, repeated with lateral flexion; STS x 10  -cues to maintain neutral head/spine           * plank with hip  ext with hands on bench  * suspended by noodle between LEs : UE supported on yellow hand buoys-cycling,  holding corner: hip abdc/add, cc ski.    *walking forward/ backward with arms at side  between exercises for recovery  Pt requires the buoyancy and hydrostatic pressure of water for support, and to offload joints by unweighting joint load by at least 50 % in navel deep water  and by at least 75-80% in chest to neck deep water.  Viscosity of the water is needed for resistance of strengthening. Water current perturbations provides challenge to standing balance requiring increased core activation.  PATIENT EDUCATION:  Education details: aquatic progressions  Person educated: Patient Education method: Explanation, Demonstration, Corporate treasurer cues, Verbal cues Education comprehension: verbalized understanding, returned demonstration, verbal cues required, and tactile cues required   HOME EXERCISE PROGRAM: Access Code: [email or web address] URL: [email or web address] Date: 10/31/2021 Prepared by: Daleen Bo  Aquatic:  Access Code: [phone] URL: [email or web address] Date: 11/17/2021 Prepared by: [place]  Exercises - Forward Walking  - 1 x daily - 7 x weekly - 3 sets - Side Stepping  - 1 x daily - 7 x weekly - 3 sets - Standing Row with Anchored Resistance  - 1 x daily - 7 x weekly - 1 sets - 10 reps - Standing 3-way leg kick holding noodle  - 1 x daily - 7 x weekly - 1 sets - 10 reps - Single Leg Stance Clamshell  - 1 x daily - 7 x weekly - 1 sets - 10 reps - Heel Toe Raises at [place]  - 1 x daily - 7 x weekly - 1 sets - 10 reps - Seated Straddle on Flotation Forward Breast Stroke Arms and Bicycle Legs  - 1 x daily - 7 x weekly - 3 sets - 10 reps - [place] Ski with Foam Dumbbells and Ankle Floats  - 1 x daily - 7 x weekly - 3 sets - 10 reps  ASSESSMENT:  CLINICAL IMPRESSION:.  Pt reports improved compliance with meds as prescribed by MD. She presents with less anxiety today which allows for improved toleration to session. She completes all exercises with little discomfort require just 1 seated rest period. No LOB with straddling noodle/improved ability to balance. Goals ongoing.    OBJECTIVE IMPAIRMENTS Abnormal gait, decreased activity tolerance, decreased balance, decreased coordination,  decreased knowledge of condition, decreased mobility, difficulty walking, decreased ROM, decreased strength, hypomobility, increased muscle spasms, impaired flexibility, improper body mechanics, postural dysfunction, and pain.   ACTIVITY LIMITATIONS carrying, lifting, bending, sitting, squatting, sleeping, stairs, transfers, and locomotion level  PARTICIPATION LIMITATIONS: cleaning, interpersonal relationship, driving, shopping, community activity, occupation, and exercise  PERSONAL FACTORS Age, Behavior pattern, Fitness, Time since onset of injury/illness/exacerbation, social, and 3+ comorbidities:  are also affecting patient's functional outcome.   REHAB POTENTIAL: Fair    CLINICAL DECISION MAKING: Evolving/moderate complexity  EVALUATION COMPLEXITY: Moderate   GOALS:   SHORT TERM GOALS: Target date: 12/12/2021  Pt will become independent with HEP in order to demonstrate synthesis of PT education. Goal status: met  2.  Pt will report at least 2 pt reduction on NPRS scale for pain in order to demonstrate functional improvement with household activity, self care, and ADL.   Goal status: ongoing pain is variable  3.  Pt will demonstrate at least a 12.8 improvement in Modified Oswestry Index in order to demonstrate a clinically significant change in LBP and function.   Goal status: ongoing  4. Pt will demonstrate at least a 7.5 improvement in Neck Disability Index in order to demonstrate a clinically significant change in neck pain and function.   Goal status: INITIAL    LONG  TERM GOALS: Target date: 01/23/2022  Pt  will become independent with final HEP in order to demonstrate synthesis of PT education.  Goal status: INITIAL  2.  Pt will be able to perform 5XSTS in under 12s  in order to demonstrate functional improvement above the cut off score for adults.   Goal status: INITIAL  3.  Pt will be able to demonstrate/report ability to walk >10 mins with <3/10 pain pain in  order to demonstrate functional improvement and tolerance to exercise and community mobility.   Goal status: INITIAL  4.  Pt will be able to lift//hold >15lbs in order to demonstrate functional improvement in lumbopelvic strength for return to ADL and exercise.   Goal status: INITIAL  5.  Pelvic floor goals to be set at time of (re)evaluation  Goal status: INITIAL   PLAN: PT FREQUENCY: 1-2x/week  PT DURATION: 12 weeks (likely D/C in 8 wks)  PLANNED INTERVENTIONS: Therapeutic exercises, Therapeutic activity, Neuromuscular re-education, Balance training, Gait training, Patient/Family education, Self Care, Joint mobilization, Joint manipulation, Stair training, Orthotic/Fit training, DME instructions, Aquatic Therapy, Dry Needling, Electrical stimulation, Spinal manipulation, Spinal mobilization, Cryotherapy, Moist heat, scar mobilization, Taping, Vasopneumatic device, Traction, Ultrasound, Biofeedback, Ionotophoresis 77m/ml Dexamethasone, Manual therapy, and Re-evaluation.  PLAN FOR NEXT SESSION:  aquatic therapy: gentle UE motion, light hip and lumbar mobility; chronic pain relief  MAnnamarie Major Derak Schurman MPT 12/26/21 1:35 PM [facility] [address] [address]Phone: [phone]  Fax:  [phone]

## 2021-12-26 NOTE — Telephone Encounter (Signed)
I had put samples of Linzess up for the pt to pick up Oct 23rd which she said will be a few days and the pt has not picked up. 2 boxes of Linzess 145 mcg and 4 boxes of Linzess 72 mcg were put back on the shelf.

## 2022-01-02 ENCOUNTER — Ambulatory Visit (HOSPITAL_BASED_OUTPATIENT_CLINIC_OR_DEPARTMENT_OTHER): Payer: Self-pay | Admitting: Physical Therapy

## 2022-01-06 DIAGNOSIS — Z419 Encounter for procedure for purposes other than remedying health state, unspecified: Secondary | ICD-10-CM | POA: Diagnosis not present

## 2022-01-10 ENCOUNTER — Encounter (HOSPITAL_BASED_OUTPATIENT_CLINIC_OR_DEPARTMENT_OTHER): Payer: Self-pay | Admitting: Physical Therapy

## 2022-01-10 ENCOUNTER — Ambulatory Visit (HOSPITAL_BASED_OUTPATIENT_CLINIC_OR_DEPARTMENT_OTHER): Payer: Medicaid Other | Attending: Podiatry | Admitting: Physical Therapy

## 2022-01-10 DIAGNOSIS — M62838 Other muscle spasm: Secondary | ICD-10-CM | POA: Insufficient documentation

## 2022-01-10 DIAGNOSIS — M5459 Other low back pain: Secondary | ICD-10-CM | POA: Insufficient documentation

## 2022-01-10 DIAGNOSIS — M6281 Muscle weakness (generalized): Secondary | ICD-10-CM | POA: Diagnosis not present

## 2022-01-10 NOTE — Therapy (Signed)
OUTPATIENT PHYSICAL THERAPY THORACOLUMBAR TREATMENT   Patient Name: Meghan Carter MRN: [id number] DOB:[birth year], 41 y.o., female Today's Date: 01/10/2022   PT End of Session - 01/10/22 1732     Visit Number 13    Number of Visits 18    Date for PT Re-Evaluation 01/29/22    Authorization Type [employer]    PT Start Time 4193    PT Stop Time 1445    PT Time Calculation (min) 42 min    Activity Tolerance Patient tolerated treatment well    Behavior During Therapy [facility] for tasks assessed/performed               Past Medical History:  Diagnosis Date   Acute blood loss anemia 03/26/2015   Anemia    Anxiety    Atherosclerosis of arteries    Blood transfusion without reported diagnosis 2017   2 units, no reaction   Callus of foot 02/08/2011   Chronic kidney disease    Depression    Dysmenorrhea    Endometrial polyp 02/11/2014   Fecal occult blood test positive 01/27/2014   Fecal occult blood test positive 01/27/2014   Fibroid    High-tone pelvic floor dysfunction 03/17/2015   History of abnormal cervical Pap smear 02/11/2014   History of acute pancreatitis 03/31/2011   HSV (herpes simplex virus) infection    Hydrosalpinx    Bilateral   Internal hemorrhoids    Lichenification and lichen simplex chronicus 11/07/2010   Lower abdominal pain    Menorrhagia with regular cycle 01/27/2014   Menorrhagia with regular cycle 01/27/2014   Other fatigue    Pelvic pain in female 01/27/2014   Plantar fasciitis 02/08/2011   Prediabetes    Rectal bleeding 01/27/2014   Rectal pain 03/19/2014   Severe anemia    Tinnitus    Vaginal Pap smear, abnormal    Past Surgical History:  Procedure Laterality Date   BIOPSY  12/16/2021   Procedure: BIOPSY;  Surgeon: Eloise Harman, DO;  [facility];  Service: Endoscopy;;   CHROMOPERTUBATION  08/14/2017   Procedure: CHROMOPERTUBATION;  Surgeon: Governor Specking, MD;  Location: [facility];  Service: Gynecology;;   COLONOSCOPY N/A  03/23/2014   SLF: 1. The examined terminal ileum appeared to be normal. 2. The left colon is redundant 3. Moderate sized internal hemorrhoids   COLONOSCOPY WITH PROPOFOL N/A 12/16/2021   Procedure: COLONOSCOPY WITH PROPOFOL;  Surgeon: Eloise Harman, DO;  [facility];  Service: Endoscopy;  Laterality: N/A;  1:15pm, asa 3   ECTOPIC PREGNANCY SURGERY Left 2005   ESOPHAGOGASTRODUODENOSCOPY (EGD) WITH PROPOFOL N/A 12/16/2021   Procedure: ESOPHAGOGASTRODUODENOSCOPY (EGD) WITH PROPOFOL;  Surgeon: Eloise Harman, DO;  [facility];  Service: Endoscopy;  Laterality: N/A;   FLEXIBLE SIGMOIDOSCOPY N/A 04/01/2015   SLF: rectal bleeding due to large internal hemorrhoids 2. anemia due to rectal bleeding/memses/ poor iron intake.   FLEXIBLE SIGMOIDOSCOPY N/A 03/27/2015   Procedure: FLEXIBLE SIGMOIDOSCOPY;  Surgeon: Danie Binder, MD;  [facility];  Service: Endoscopy;  Laterality: N/A;   HEMORRHOID BANDING N/A 03/23/2014   Procedure: HEMORRHOID BANDING;  Surgeon: Danie Binder, MD;  [facility];  Service: Endoscopy;  Laterality: N/A;   HEMORRHOID BANDING  03/27/2015   Procedure: HEMORRHOID BANDING;  Surgeon: Danie Binder, MD;  [facility];  Service: Endoscopy;;   HEMORRHOID SURGERY N/A 01/11/2018   Procedure: EXTENSIVE HEMORRHOIDECTOMY;  Surgeon: Aviva Signs, MD;  Location: [facility];  Service: General;  Laterality: N/A;   HYSTEROSCOPY WITH D & C N/A 06/16/2014   Procedure: DILATATION AND CURETTAGE /HYSTEROSCOPY;  Surgeon: Jonnie Kind, MD;  Location: [facility];  Service: Gynecology;  Laterality: N/A;   LAPAROSCOPIC UNILATERAL SALPINGECTOMY Right 06/16/2014   Procedure: LAPAROSCOPIC UNILATERAL SALPINGECTOMY;  Surgeon: Jonnie Kind, MD;  Location: [facility];  Service: Gynecology;  Laterality: Right;   LAPAROSCOPY N/A 08/14/2017   Procedure: OPERATIVE LAPAROSCOPY ,LEFT SALPINGECTOMY, LYSIS OF ADHESIONS;  Surgeon: Governor Specking, MD;  [facility];  Service: Gynecology;  Laterality: N/A;   POLYPECTOMY N/A 06/16/2014   Procedure: POLYPECTOMY (endometrial);  Surgeon: Jonnie Kind, MD;  Location: [facility];  Service: Gynecology;  Laterality: N/A;   Patient Active Problem List   Diagnosis Date Noted   Lichen planus 70/78/6754   Spine pain, cervical 01/25/2021   Acute post-traumatic headache, not intractable 01/25/2021   Encounter for support and coordination of transition of care 01/25/2021   Leiomyoma 12/29/2020   Morbid obesity ([place]) 12/29/2020   Enlarged thyroid 06/29/2020   Constipation 06/29/2020   Annual physical exam 01/23/2019   Recurrent major depressive disorder, in partial remission ([place]) 01/14/2019   Depression, major, single episode, moderate ([place]) 11/19/2018   Anxiety 11/19/2018   Herpes simplex type 2 infection 01/24/2018   Internal and external bleeding hemorrhoids    History of abnormal cervical Pap smear 02/11/2014   Hemorrhoids, internal 01/27/2014   Nondependent alcohol abuse, episodic drinking behavior 03/31/2011    PCP: [facility], Trenton Gammon, NP  REFERRING PROVIDER: Carole Civil, MD  Porfirio Mylar, FNP (pelvic referral)  REFERRING DIAG:  M50.30 (ICD-10-CM) - DDD (degenerative disc disease), cervical  M51.37 (ICD-10-CM) - DDD (degenerative disc disease), lumbosacral  M54.50,G89.29 (ICD-10-CM) - Chronic midline low back pain, unspecified whether sciatica present   R10.2 (ICD-10-CM) - Pelvic and perineal pain  G89.29 (ICD-10-CM) - Other chronic pain  M54.50 (ICD-10-CM) - Low back pain, unspecified  M25.551 (ICD-10-CM) - Pain in right hip  M25.552 (ICD-10-CM) - Pain in left hip   Rationale for Evaluation and Treatment Rehabilitation  THERAPY DIAG:  Other low back pain  Muscle weakness (generalized)  Other muscle spasm  ONSET DATE: 2023  SUBJECTIVE:                                                                                                                                                                                            SUBJECTIVE STATEMENT: Doing "OK"  PERTINENT HISTORY:   February 07 2022 surgery- planned to be sooner; hysterectomy  3 abdominal surgeries, one for miscarriage in 2005, 2016 was cyst removal and salpingectomy Rt,  Lt salpingectomy 2019 and some endometriosis.  PAIN:  Are you having pain? Yes:  NPRS scale: 10/10 Pain location:  neck, low back; Lt ankle Pain description: sharp; dull; shooting Aggravating factors: standing, sitting, cooking, cleaning, groceries, stairs Relieving factors: resting, sitting   PRECAUTIONS: None  WEIGHT BEARING RESTRICTIONS No  FALLS:  Has patient fallen in last 6 months? No  LIVING ENVIRONMENT: Lives with: lives with their family Lives in: House/apartment Stairs: Yes Has following equipment at home: Single point cane  OCCUPATION: on leave from work at the moment; Producer, television/film/video; climbing ladders     PLOF: Independent with basic ADLs  PATIENT GOALS :    OBJECTIVE: (findings taken at evaluation unless otherwise noted)  DIAGNOSTIC FINDINGS:  IMPRESSION: No acute fracture or dislocation. Minimal degenerative joint changes of lower thoracic spine.     Lumbar X-ray IMPRESSION: Negative.  IMPRESSION: No acute fracture or dislocation. Mild degenerative joint changes of cervical spine.  PATIENT SURVEYS:  Modified Oswestry    NDI:   SCREENING FOR RED FLAGS: Bowel or bladder incontinence: Yes: history of urge incontinence Spinal tumors: No Cauda equina syndrome: No Compression fracture: No   COGNITION:  Overall cognitive status: Within functional limits for tasks assessed     SENSATION: Light touch: WFL  POSTURE: rounded shoulders, decreased lumbar lordosis, and anterior pelvic tilt  LUMBAR ROM:  all lumbar ROM exacerbates  pelvic discomfort and pressure; held further ROM screening of lumbar and LE due to pelvic pain  Cervical ROM:   Active  A/PROM  eval    Flexion WFL   Extension WFL   Right lateral flexion 75% HA sensation   Left lateral flexion 75% HA sensation   Right rotation 75%   Left rotation 75%    (Blank rows = not tested)  LOWER EXTREMITY MMT:    Active  Right eval Left eval Right / Left  Hip flexion 4-/5 p! 4/5 4-/5  4/5 p!  Hip extension     Hip abduction 4-/5 p! 4-/5 p! 4-/5  p!  Hip adduction 4-/5 p! 4-/5 p! 4-/5p!   (Blank rows = not tested)  Grip Strength:  R 15lbs; 15,15;  L 15lbs, 20, 20   FUNCTIONAL TESTS:  STS transfers: requires single UE for standing 11/7: 5X STS: 22.6  GAIT: Distance walked: 72f Assistive device utilized: None Level of assistance: Complete Independence Comments: toe out gait, decreased hip extension, hip hiking, decreased step length    TODAY'S TREATMENT  Pt seen for aquatic therapy today.  Treatment took place in water 3.25-4.5 ft [place] [facility] Temp of water was 92.  Pt entered/exited the pool via stairs independently with bilat rail.   * unsupported walking forward, backward *g[place]vine with vc and mirroring *L stretch on wall *hip hinges ue support on squoodle *QL stretches *midline crossing reach UE R/L for rotation *Return to grapevine *Core engagement noodle press down for TrA isometrics - oscillating in frontal and sagittal planes *Cross midline rotation ue noodle opposite LE.  Pt requires the buoyancy and hydrostatic pressure of water for support, and to offload joints by unweighting joint load by at least 50 % in navel deep water and by at least 75-80% in chest to neck deep water.  Viscosity of the water is needed for resistance of strengthening. Water current perturbations provides challenge to standing balance requiring increased core activation.  PATIENT EDUCATION:  Education details: aquatic progressions  Person educated: Patient Education method: Explanation, Demonstration, Tactile cues, Verbal cues  Education comprehension:  verbalized understanding, returned demonstration, verbal cues required, and tactile cues required   HOME EXERCISE PROGRAM: Access Code: [email or web address] URL: [email or web address] Date: 10/31/2021 Prepared by: Daleen Bo  Aquatic:  Access Code: [phone] URL: [email or web address] Date: 11/17/2021 Prepared by: [place]  Exercises - Forward Walking  - 1 x daily - 7 x weekly - 3 sets - Side Stepping  - 1 x daily - 7 x weekly - 3 sets - Standing Row with Anchored Resistance  - 1 x daily - 7 x weekly - 1 sets - 10 reps - Standing 3-way leg kick holding noodle  - 1 x daily - 7 x weekly - 1 sets - 10 reps - Single Leg Stance Clamshell  - 1 x daily - 7 x weekly - 1 sets - 10 reps - Heel Toe Raises at [place]  - 1 x daily - 7 x weekly - 1 sets - 10 reps - Seated Straddle on Flotation Forward Breast Stroke Arms and Bicycle Legs  - 1 x daily - 7 x weekly - 3 sets - 10 reps - [place] with Foam Dumbbells and Ankle Floats  - 1 x daily - 7 x weekly - 3 sets - 10 reps  ASSESSMENT:  CLINICAL IMPRESSION:. Focus today on crossing midline to decrease guarded positioning. She reports stretching in LB and glutes which feels good and decrease pain. She reports resolving gastric and bowel issues. She continues to report high pain. Has surgical procedure scheduled in next few weeks.  Plan to DC with final aquatic HEP next week   Pt reports improved compliance with meds as prescribed by MD. She presents with less anxiety today which allows for improved toleration to session. She completes all exercises with little discomfort require just 1 seated rest period. No LOB with straddling noodle/improved ability to balance. Goals ongoing.    OBJECTIVE IMPAIRMENTS Abnormal gait, decreased activity tolerance, decreased balance, decreased coordination, decreased knowledge of condition, decreased mobility, difficulty walking, decreased ROM,  decreased strength, hypomobility, increased muscle spasms, impaired flexibility, improper body mechanics, postural dysfunction, and pain.   ACTIVITY LIMITATIONS carrying, lifting, bending, sitting, squatting, sleeping, stairs, transfers, and locomotion level  PARTICIPATION LIMITATIONS: cleaning, interpersonal relationship, driving, shopping, community activity, occupation, and exercise  PERSONAL FACTORS Age, Behavior pattern, Fitness, Time since onset of injury/illness/exacerbation, social, and 3+ comorbidities:  are also affecting patient's functional outcome.   REHAB POTENTIAL: Fair    CLINICAL DECISION MAKING: Evolving/moderate complexity  EVALUATION COMPLEXITY: Moderate   GOALS:   SHORT TERM GOALS: Target date: 12/12/2021  Pt will become independent with HEP in order to demonstrate synthesis of PT education. Goal status: met  2.  Pt will report at least 2 pt reduction on NPRS scale for pain in order to demonstrate functional improvement with household activity, self care, and ADL.   Goal status: ongoing pain is variable  3.  Pt will demonstrate at least a 12.8 improvement in Modified Oswestry Index in order to demonstrate a clinically significant change in LBP and function.   Goal status: ongoing  4. Pt will demonstrate at least a 7.5 improvement in Neck Disability Index in order to demonstrate a clinically significant change in neck pain and function.   Goal status: INITIAL    LONG TERM GOALS: Target date: 01/23/2022  Pt  will become independent with final HEP in order to demonstrate synthesis of PT education.  Goal status: INITIAL  2.  Pt will be able  to perform 5XSTS in under 12s  in order to demonstrate functional improvement above the cut off score for adults.   Goal status: INITIAL  3.  Pt will be able to demonstrate/report ability to walk >10 mins with <3/10 pain pain in order to demonstrate functional improvement and tolerance to exercise and community  mobility.   Goal status: INITIAL  4.  Pt will be able to lift//hold >15lbs in order to demonstrate functional improvement in lumbopelvic strength for return to ADL and exercise.   Goal status: INITIAL  5.  Pelvic floor goals to be set at time of (re)evaluation  Goal status: INITIAL   PLAN: PT FREQUENCY: 1-2x/week  PT DURATION: 12 weeks (likely D/C in 8 wks)  PLANNED INTERVENTIONS: Therapeutic exercises, Therapeutic activity, Neuromuscular re-education, Balance training, Gait training, Patient/Family education, Self Care, Joint mobilization, Joint manipulation, Stair training, Orthotic/Fit training, DME instructions, Aquatic Therapy, Dry Needling, Electrical stimulation, Spinal manipulation, Spinal mobilization, Cryotherapy, Moist heat, scar mobilization, Taping, Vasopneumatic device, Traction, Ultrasound, Biofeedback, Ionotophoresis 38m/ml Dexamethasone, Manual therapy, and Re-evaluation.  PLAN FOR NEXT SESSION:  aquatic therapy: gentle UE motion, light hip and lumbar mobility; chronic pain relief  MAnnamarie Major Clairissa Valvano MPT 01/10/22 5:33 PM [facility] [address] [address]Phone: [phone]  Fax:  [phone]

## 2022-01-12 ENCOUNTER — Encounter (HOSPITAL_BASED_OUTPATIENT_CLINIC_OR_DEPARTMENT_OTHER): Payer: Self-pay | Admitting: Physical Therapy

## 2022-01-12 ENCOUNTER — Ambulatory Visit (HOSPITAL_BASED_OUTPATIENT_CLINIC_OR_DEPARTMENT_OTHER): Payer: Medicaid Other | Admitting: Physical Therapy

## 2022-01-12 DIAGNOSIS — M62838 Other muscle spasm: Secondary | ICD-10-CM

## 2022-01-12 DIAGNOSIS — M6281 Muscle weakness (generalized): Secondary | ICD-10-CM | POA: Diagnosis not present

## 2022-01-12 DIAGNOSIS — M5459 Other low back pain: Secondary | ICD-10-CM

## 2022-01-12 NOTE — Therapy (Signed)
OUTPATIENT PHYSICAL THERAPY THORACOLUMBAR TREATMENT   Patient Name: Meghan Carter MRN: [id number] DOB:[birth year], 41 y.o., female Today's Date: 01/12/2022   PT End of Session - 01/12/22 1407     Visit Number 14    Number of Visits 18    Date for PT Re-Evaluation 01/29/22    Authorization Type [employer]    PT Start Time 9147    PT Stop Time 1440    PT Time Calculation (min) 42 min    Activity Tolerance Patient tolerated treatment well    Behavior During Therapy [facility] for tasks assessed/performed               Past Medical History:  Diagnosis Date   Acute blood loss anemia 03/26/2015   Anemia    Anxiety    Atherosclerosis of arteries    Blood transfusion without reported diagnosis 2017   2 units, no reaction   Callus of foot 02/08/2011   Chronic kidney disease    Depression    Dysmenorrhea    Endometrial polyp 02/11/2014   Fecal occult blood test positive 01/27/2014   Fecal occult blood test positive 01/27/2014   Fibroid    High-tone pelvic floor dysfunction 03/17/2015   History of abnormal cervical Pap smear 02/11/2014   History of acute pancreatitis 03/31/2011   HSV (herpes simplex virus) infection    Hydrosalpinx    Bilateral   Internal hemorrhoids    Lichenification and lichen simplex chronicus 11/07/2010   Lower abdominal pain    Menorrhagia with regular cycle 01/27/2014   Menorrhagia with regular cycle 01/27/2014   Other fatigue    Pelvic pain in female 01/27/2014   Plantar fasciitis 02/08/2011   Prediabetes    Rectal bleeding 01/27/2014   Rectal pain 03/19/2014   Severe anemia    Tinnitus    Vaginal Pap smear, abnormal    Past Surgical History:  Procedure Laterality Date   BIOPSY  12/16/2021   Procedure: BIOPSY;  Surgeon: Eloise Harman, DO;  [facility];  Service: Endoscopy;;   CHROMOPERTUBATION  08/14/2017   Procedure: CHROMOPERTUBATION;  Surgeon: Governor Specking, MD;  Location: [facility];  Service: Gynecology;;   COLONOSCOPY N/A  03/23/2014   SLF: 1. The examined terminal ileum appeared to be normal. 2. The left colon is redundant 3. Moderate sized internal hemorrhoids   COLONOSCOPY WITH PROPOFOL N/A 12/16/2021   Procedure: COLONOSCOPY WITH PROPOFOL;  Surgeon: Eloise Harman, DO;  [facility];  Service: Endoscopy;  Laterality: N/A;  1:15pm, asa 3   ECTOPIC PREGNANCY SURGERY Left 2005   ESOPHAGOGASTRODUODENOSCOPY (EGD) WITH PROPOFOL N/A 12/16/2021   Procedure: ESOPHAGOGASTRODUODENOSCOPY (EGD) WITH PROPOFOL;  Surgeon: Eloise Harman, DO;  [facility];  Service: Endoscopy;  Laterality: N/A;   FLEXIBLE SIGMOIDOSCOPY N/A 04/01/2015   SLF: rectal bleeding due to large internal hemorrhoids 2. anemia due to rectal bleeding/memses/ poor iron intake.   FLEXIBLE SIGMOIDOSCOPY N/A 03/27/2015   Procedure: FLEXIBLE SIGMOIDOSCOPY;  Surgeon: Danie Binder, MD;  [facility];  Service: Endoscopy;  Laterality: N/A;   HEMORRHOID BANDING N/A 03/23/2014   Procedure: HEMORRHOID BANDING;  Surgeon: Danie Binder, MD;  [facility];  Service: Endoscopy;  Laterality: N/A;   HEMORRHOID BANDING  03/27/2015   Procedure: HEMORRHOID BANDING;  Surgeon: Danie Binder, MD;  [facility];  Service: Endoscopy;;   HEMORRHOID SURGERY N/A 01/11/2018   Procedure: EXTENSIVE HEMORRHOIDECTOMY;  Surgeon: Aviva Signs, MD;  Location: [facility];  Service: General;  Laterality: N/A;   HYSTEROSCOPY WITH D & C N/A 06/16/2014   Procedure: DILATATION AND CURETTAGE /HYSTEROSCOPY;  Surgeon: Jonnie Kind, MD;  Location: [facility];  Service: Gynecology;  Laterality: N/A;   LAPAROSCOPIC UNILATERAL SALPINGECTOMY Right 06/16/2014   Procedure: LAPAROSCOPIC UNILATERAL SALPINGECTOMY;  Surgeon: Jonnie Kind, MD;  Location: [facility];  Service: Gynecology;  Laterality: Right;   LAPAROSCOPY N/A 08/14/2017   Procedure: OPERATIVE LAPAROSCOPY ,LEFT SALPINGECTOMY, LYSIS OF ADHESIONS;  Surgeon: Governor Specking, MD;  Location: [facility];  Service: Gynecology;  Laterality: N/A;   POLYPECTOMY N/A 06/16/2014   Procedure: POLYPECTOMY (endometrial);  Surgeon: Jonnie Kind, MD;  Location: [facility];  Service: Gynecology;  Laterality: N/A;   Patient Active Problem List   Diagnosis Date Noted   Lichen planus 09/32/6712   Spine pain, cervical 01/25/2021   Acute post-traumatic headache, not intractable 01/25/2021   Encounter for support and coordination of transition of care 01/25/2021   Leiomyoma 12/29/2020   Morbid obesity ([place]) 12/29/2020   Enlarged thyroid 06/29/2020   Constipation 06/29/2020   Annual physical exam 01/23/2019   Recurrent major depressive disorder, in partial remission ([place]) 01/14/2019   Depression, major, single episode, moderate ([place]) 11/19/2018   Anxiety 11/19/2018   Herpes simplex type 2 infection 01/24/2018   Internal and external bleeding hemorrhoids    History of abnormal cervical Pap smear 02/11/2014   Hemorrhoids, internal 01/27/2014   Nondependent alcohol abuse, episodic drinking behavior 03/31/2011    PCP: [facility], [place]on Gammon, NP  REFERRING PROVIDER: Carole Civil, MD  Porfirio Mylar, FNP (pelvic referral)  REFERRING DIAG:  M50.30 (ICD-10-CM) - DDD (degenerative disc disease), cervical  M51.37 (ICD-10-CM) - DDD (degenerative disc disease), lumbosacral  M54.50,G89.29 (ICD-10-CM) - Chronic midline low back pain, unspecified whether sciatica present   R10.2 (ICD-10-CM) - Pelvic and perineal pain  G89.29 (ICD-10-CM) - Other chronic pain  M54.50 (ICD-10-CM) - Low back pain, unspecified  M25.551 (ICD-10-CM) - Pain in right hip  M25.552 (ICD-10-CM) - Pain in left hip   Rationale for Evaluation and Treatment Rehabilitation  THERAPY DIAG:  Other low back pain  Other muscle spasm  ONSET DATE: 2023  SUBJECTIVE:                                                                                                                                                                                            SUBJECTIVE STATEMENT: "I'm still sore from my procedure and from the exercises on Tuesday"   PERTINENT HISTORY:   February 07 2022 surgery- planned to be sooner; hysterectomy  3 abdominal surgeries, one for miscarriage in 2005,  2016 was cyst removal and salpingectomy Rt, Lt salpingectomy 2019 and some endometriosis.  PAIN:  Are you having pain? Yes:  NPRS scale: 8/10 Pain location:  generalized Pain description: sharp; dull; shooting Aggravating factors: standing, sitting, cooking, cleaning, groceries, stairs Relieving factors: resting, sitting   PRECAUTIONS: None  WEIGHT BEARING RESTRICTIONS No  FALLS:  Has patient fallen in last 6 months? No  LIVING ENVIRONMENT: Lives with: lives with their family Lives in: House/apartment Stairs: Yes Has following equipment at home: Single point cane  OCCUPATION: on leave from work at the moment; Producer, television/film/video; climbing ladders     PLOF: Independent with basic ADLs  PATIENT GOALS :    OBJECTIVE: (findings taken at evaluation unless otherwise noted)  DIAGNOSTIC FINDINGS:  IMPRESSION: No acute fracture or dislocation. Minimal degenerative joint changes of lower thoracic spine.     Lumbar X-ray IMPRESSION: Negative.  IMPRESSION: No acute fracture or dislocation. Mild degenerative joint changes of cervical spine.  PATIENT SURVEYS:  Modified Oswestry    NDI:   SCREENING FOR RED FLAGS: Bowel or bladder incontinence: Yes: history of urge incontinence Spinal tumors: No Cauda equina syndrome: No Compression fracture: No   COGNITION:  Overall cognitive status: Within functional limits for tasks assessed     SENSATION: Light touch: WFL  POSTURE: rounded shoulders, decreased lumbar lordosis, and anterior pelvic tilt  LUMBAR ROM:  all lumbar ROM exacerbates  pelvic discomfort and pressure; held further ROM screening of lumbar and LE due to pelvic pain  Cervical ROM:   Active   A/PROM  eval   Flexion WFL   Extension WFL   Right lateral flexion 75% HA sensation   Left lateral flexion 75% HA sensation   Right rotation 75%   Left rotation 75%    (Blank rows = not tested)  LOWER EXTREMITY MMT:    Active  Right eval Left eval Right / Left  Hip flexion 4-/5 p! 4/5 4-/5  4/5 p!  Hip extension     Hip abduction 4-/5 p! 4-/5 p! 4-/5  p!  Hip adduction 4-/5 p! 4-/5 p! 4-/5p!   (Blank rows = not tested)  Grip Strength:  R 15lbs; 15,15;  L 15lbs, 20, 20   FUNCTIONAL TESTS:  STS transfers: requires single UE for standing 11/7: 5X STS: 22.6  GAIT: Distance walked: 13f Assistive device utilized: None Level of assistance: Complete Independence Comments: toe out gait, decreased hip extension, hip hiking, decreased step length    TODAY'S TREATMENT  Pt seen for aquatic therapy today.  Treatment took place in water 3.25-4.5 ft in depth at the [facility] Temp of water was 92.  Pt entered/exited the pool via stairs independently with bilat rail.   * unsupported walking forward, backward, side stepping * side step squat with BUE abdct/addct, cues and mirroring (complains of R hip pain afterwards) * side to side lunge without support (no R hip pain)  * walking forward/ backward with single yellow hand float at side * staggered stance with upright kick board push pull x 10 x 2 each LE forward * holding wall -> unsupported hip crosses * hands flat on kick board with trunk rotation for ROM, stretch *[place] R/L with vc and mirroring, improved form * Plank with hip ext x 10 x 2 (limited tolerance due to R shoulder pain) * seated R upper trap stretch;  standing side bend stretch with one hand on wall, one UE reaching overhead  * straddling noodle, holding yellow hand floats: cycling,  CC ski, jumping jack LEs * shoulder press with bilat yellow UE floats x 10 x 2, with cues and mirroring for improved posture/ form *Core engagement with blue short  noodle press down for TrA engagment - moving like elevator on multiple floors * hip hinge with hands on thin squoodle x 8   Pt requires the buoyancy and hydrostatic pressure of water for support, and to offload joints by unweighting joint load by at least 50 % in navel deep water and by at least 75-80% in chest to neck deep water.  Viscosity of the water is needed for resistance of strengthening. Water current perturbations provides challenge to standing balance requiring increased core activation.  PATIENT EDUCATION:  Education details: aquatic progressions  Person educated: Patient Education method: Explanation, Demonstration, Corporate treasurer cues, Verbal cues Education comprehension: verbalized understanding, returned demonstration, verbal cues required, and tactile cues required   HOME EXERCISE PROGRAM: Access Code: [email or web address] URL: [email or web address] Date: 10/31/2021 Prepared by: Daleen Bo  Aquatic:  Access Code: [phone] URL: [email or web address]  ASSESSMENT:  CLINICAL IMPRESSION:. Pt participates well throughout despite reported elevated pain level.  She reported some discomfort in R shoulder with planks and Rt hip with side squatting; otherwise all other exercises tolerated well.  She requires some demonstration of exercises for improved form, and occasional cues for more neutral head/trunk position.    Plan to DC with final aquatic HEP next week.  Therapist to test 5x STS and pt's ability to lift/ hold 15# to address some of goals next visit.    OBJECTIVE IMPAIRMENTS Abnormal gait, decreased activity tolerance, decreased balance, decreased coordination, decreased knowledge of condition, decreased mobility, difficulty walking, decreased ROM, decreased strength, hypomobility, increased muscle spasms, impaired flexibility, improper body mechanics, postural dysfunction, and pain.   ACTIVITY LIMITATIONS carrying, lifting, bending, sitting, squatting, sleeping,  stairs, transfers, and locomotion level  PARTICIPATION LIMITATIONS: cleaning, interpersonal relationship, driving, shopping, community activity, occupation, and exercise  PERSONAL FACTORS Age, Behavior pattern, Fitness, Time since onset of injury/illness/exacerbation, social, and 3+ comorbidities:  are also affecting patient's functional outcome.   REHAB POTENTIAL: Fair    CLINICAL DECISION MAKING: Evolving/moderate complexity  EVALUATION COMPLEXITY: Moderate   GOALS:   SHORT TERM GOALS: Target date: 12/12/2021  Pt will become independent with HEP in order to demonstrate synthesis of PT education. Goal status: MET  2.  Pt will report at least 2 pt reduction on NPRS scale for pain in order to demonstrate functional improvement with household activity, self care, and ADL.   Goal status: ongoing pain is variable  3.  Pt will demonstrate at least a 12.8 improvement in Modified Oswestry Index in order to demonstrate a clinically significant change in LBP and function.   Goal status: ongoing  4. Pt will demonstrate at least a 7.5 improvement in Neck Disability Index in order to demonstrate a clinically significant change in neck pain and function.   Goal status: INITIAL    LONG TERM GOALS: Target date: 01/23/2022  Pt  will become independent with final HEP in order to demonstrate synthesis of PT education.  Goal status: INITIAL  2.  Pt will be able to perform 5XSTS in under 12s  in order to demonstrate functional improvement above the cut off score for adults.   Goal status: INITIAL  3.  Pt will be able to demonstrate/report ability to walk >10 mins with <3/10 pain pain in order to demonstrate functional improvement and tolerance to exercise and community mobility.   Goal status: INITIAL  4.  Pt will be able to lift//hold >15lbs in order to demonstrate functional improvement in lumbopelvic strength for return to ADL and exercise.   Goal status: INITIAL  5.  Pelvic floor  goals to be set at time of (re)evaluation  Goal status: INITIAL   PLAN: PT FREQUENCY: 1-2x/week  PT DURATION: 12 weeks (likely D/C in 8 wks)  PLANNED INTERVENTIONS: Therapeutic exercises, Therapeutic activity, Neuromuscular re-education, Balance training, Gait training, Patient/Family education, Self Care, Joint mobilization, Joint manipulation, Stair training, Orthotic/Fit training, DME instructions, Aquatic Therapy, Dry Needling, Electrical stimulation, Spinal manipulation, Spinal mobilization, Cryotherapy, Moist heat, scar mobilization, Taping, Vasopneumatic device, Traction, Ultrasound, Biofeedback, Ionotophoresis 49m/ml Dexamethasone, Manual therapy, and Re-evaluation.  PLAN FOR NEXT SESSION:  aquatic therapy: gentle UE motion, light hip and lumbar mobility; chronic pain relief  JKerin Perna PTA 01/12/22 3:02 PM [facility] [address] [place] [address]Phone: [phone]  Fax:  [phone]

## 2022-01-17 ENCOUNTER — Ambulatory Visit (HOSPITAL_BASED_OUTPATIENT_CLINIC_OR_DEPARTMENT_OTHER): Payer: Medicaid Other | Admitting: Physical Therapy

## 2022-01-17 ENCOUNTER — Encounter (HOSPITAL_BASED_OUTPATIENT_CLINIC_OR_DEPARTMENT_OTHER): Payer: Self-pay | Admitting: Physical Therapy

## 2022-01-17 ENCOUNTER — Other Ambulatory Visit (HOSPITAL_BASED_OUTPATIENT_CLINIC_OR_DEPARTMENT_OTHER): Payer: Self-pay

## 2022-01-17 DIAGNOSIS — M5459 Other low back pain: Secondary | ICD-10-CM

## 2022-01-17 DIAGNOSIS — M62838 Other muscle spasm: Secondary | ICD-10-CM | POA: Diagnosis not present

## 2022-01-17 DIAGNOSIS — M6281 Muscle weakness (generalized): Secondary | ICD-10-CM

## 2022-01-17 NOTE — Therapy (Signed)
OUTPATIENT PHYSICAL THERAPY THORACOLUMBAR TREATMENT   Patient Name: Meghan Carter MRN: [id number] DOB:1980-03-08, 41 y.o., female Today's Date: 01/17/2022   PT End of Session - 01/17/22 1403     Visit Number 15    Number of Visits 18    Date for PT Re-Evaluation 01/29/22    Authorization Type [employer]    PT Start Time 1400    PT Stop Time 1445    PT Time Calculation (min) 45 min    Activity Tolerance Patient tolerated treatment well    Behavior During Therapy [facility] for tasks assessed/performed               Past Medical History:  Diagnosis Date   Acute blood loss anemia 03/26/2015   Anemia    Anxiety    Atherosclerosis of arteries    Blood transfusion without reported diagnosis 2017   2 units, no reaction   Callus of foot 02/08/2011   Chronic kidney disease    Depression    Dysmenorrhea    Endometrial polyp 02/11/2014   Fecal occult blood test positive 01/27/2014   Fecal occult blood test positive 01/27/2014   Fibroid    High-tone pelvic floor dysfunction 03/17/2015   History of abnormal cervical Pap smear 02/11/2014   History of acute pancreatitis 03/31/2011   HSV (herpes simplex virus) infection    Hydrosalpinx    Bilateral   Internal hemorrhoids    Lichenification and lichen simplex chronicus 11/07/2010   Lower abdominal pain    Menorrhagia with regular cycle 01/27/2014   Menorrhagia with regular cycle 01/27/2014   Other fatigue    Pelvic pain in female 01/27/2014   Plantar fasciitis 02/08/2011   Prediabetes    Rectal bleeding 01/27/2014   Rectal pain 03/19/2014   Severe anemia    Tinnitus    Vaginal Pap smear, abnormal    Past Surgical History:  Procedure Laterality Date   BIOPSY  12/16/2021   Procedure: BIOPSY;  Surgeon: Eloise Harman, DO;  [facility];  Service: Endoscopy;;   CHROMOPERTUBATION  08/14/2017   Procedure: CHROMOPERTUBATION;  Surgeon: Governor Specking, MD;  Location: [facility];  Service: Gynecology;;   COLONOSCOPY N/A  03/23/2014   SLF: 1. The examined terminal ileum appeared to be normal. 2. The left colon is redundant 3. Moderate sized internal hemorrhoids   COLONOSCOPY WITH PROPOFOL N/A 12/16/2021   Procedure: COLONOSCOPY WITH PROPOFOL;  Surgeon: Eloise Harman, DO;  [facility];  Service: Endoscopy;  Laterality: N/A;  1:15pm, asa 3   ECTOPIC PREGNANCY SURGERY Left 2005   ESOPHAGOGASTRODUODENOSCOPY (EGD) WITH PROPOFOL N/A 12/16/2021   Procedure: ESOPHAGOGASTRODUODENOSCOPY (EGD) WITH PROPOFOL;  Surgeon: Eloise Harman, DO;  [facility];  Service: Endoscopy;  Laterality: N/A;   FLEXIBLE SIGMOIDOSCOPY N/A 04/01/2015   SLF: rectal bleeding due to large internal hemorrhoids 2. anemia due to rectal bleeding/memses/ poor iron intake.   FLEXIBLE SIGMOIDOSCOPY N/A 03/27/2015   Procedure: FLEXIBLE SIGMOIDOSCOPY;  Surgeon: Danie Binder, MD;  [facility];  Service: Endoscopy;  Laterality: N/A;   HEMORRHOID BANDING N/A 03/23/2014   Procedure: HEMORRHOID BANDING;  Surgeon: Danie Binder, MD;  [facility];  Service: Endoscopy;  Laterality: N/A;   HEMORRHOID BANDING  03/27/2015   Procedure: HEMORRHOID BANDING;  Surgeon: Danie Binder, MD;  [facility];  Service: Endoscopy;;   HEMORRHOID SURGERY N/A 01/11/2018   Procedure: EXTENSIVE HEMORRHOIDECTOMY;  Surgeon: Aviva Signs, MD;  Location: [facility];  Service: General;  Laterality: N/A;   HYSTEROSCOPY WITH D & C N/A 06/16/2014   Procedure: DILATATION AND CURETTAGE /HYSTEROSCOPY;  Surgeon: Jonnie Kind, MD;  Location: [facility];  Service: Gynecology;  Laterality: N/A;   LAPAROSCOPIC UNILATERAL SALPINGECTOMY Right 06/16/2014   Procedure: LAPAROSCOPIC UNILATERAL SALPINGECTOMY;  Surgeon: Jonnie Kind, MD;  Location: [facility];  Service: Gynecology;  Laterality: Right;   LAPAROSCOPY N/A 08/14/2017   Procedure: OPERATIVE LAPAROSCOPY ,LEFT SALPINGECTOMY, LYSIS OF ADHESIONS;  Surgeon: Governor Specking, MD;  Location: [facility];  Service: Gynecology;  Laterality: N/A;   POLYPECTOMY N/A 06/16/2014   Procedure: POLYPECTOMY (endometrial);  Surgeon: Jonnie Kind, MD;  Location: [facility];  Service: Gynecology;  Laterality: N/A;   Patient Active Problem List   Diagnosis Date Noted   Lichen planus 07/30/7626   Spine pain, cervical 01/25/2021   Acute post-traumatic headache, not intractable 01/25/2021   Encounter for support and coordination of transition of care 01/25/2021   Leiomyoma 12/29/2020   Morbid obesity ([place]) 12/29/2020   Enlarged thyroid 06/29/2020   Constipation 06/29/2020   Annual physical exam 01/23/2019   Recurrent major depressive disorder, in partial remission ([place]) 01/14/2019   Depression, major, single episode, moderate ([place]) 11/19/2018   Anxiety 11/19/2018   Herpes simplex type 2 infection 01/24/2018   Internal and external bleeding hemorrhoids    History of abnormal cervical Pap smear 02/11/2014   Hemorrhoids, internal 01/27/2014   Nondependent alcohol abuse, episodic drinking behavior 03/31/2011    PCP: [facility], Trenton Gammon, NP  REFERRING PROVIDER: Carole Civil, MD  Porfirio Mylar, FNP (pelvic referral)  REFERRING DIAG:  M50.30 (ICD-10-CM) - DDD (degenerative disc disease), cervical  M51.37 (ICD-10-CM) - DDD (degenerative disc disease), lumbosacral  M54.50,G89.29 (ICD-10-CM) - Chronic midline low back pain, unspecified whether sciatica present   R10.2 (ICD-10-CM) - Pelvic and perineal pain  G89.29 (ICD-10-CM) - Other chronic pain  M54.50 (ICD-10-CM) - Low back pain, unspecified  M25.551 (ICD-10-CM) - Pain in right hip  M25.552 (ICD-10-CM) - Pain in left hip   Rationale for Evaluation and Treatment Rehabilitation  THERAPY DIAG:  Other low back pain  Other muscle spasm  Muscle weakness (generalized)  ONSET DATE: 2023  SUBJECTIVE:                                                                                                                                                                                            SUBJECTIVE STATEMENT: "I hurts some from walking down here and sitting"  PERTINENT HISTORY:   February 07 2022 surgery- planned to be sooner; hysterectomy  3 abdominal surgeries, one for miscarriage in 2005,  2016 was cyst removal and salpingectomy Rt, Lt salpingectomy 2019 and some endometriosis.  PAIN:  Are you having pain? Yes:  NPRS scale: 6-7 Pain location:  generalized Pain description: sharp; dull; shooting Aggravating factors: standing, sitting, cooking, cleaning, groceries, stairs Relieving factors: resting, sitting   PRECAUTIONS: None  WEIGHT BEARING RESTRICTIONS No  FALLS:  Has patient fallen in last 6 months? No  LIVING ENVIRONMENT: Lives with: lives with their family Lives in: House/apartment Stairs: Yes Has following equipment at home: Single point cane  OCCUPATION: on leave from work at the moment; Producer, television/film/video; climbing ladders     PLOF: Independent with basic ADLs  PATIENT GOALS :    OBJECTIVE: (findings taken at evaluation unless otherwise noted)  DIAGNOSTIC FINDINGS:  IMPRESSION: No acute fracture or dislocation. Minimal degenerative joint changes of lower thoracic spine.     Lumbar X-ray IMPRESSION: Negative.  IMPRESSION: No acute fracture or dislocation. Mild degenerative joint changes of cervical spine.  PATIENT SURVEYS:  Modified Oswestry    NDI:   SCREENING FOR RED FLAGS: Bowel or bladder incontinence: Yes: history of urge incontinence Spinal tumors: No Cauda equina syndrome: No Compression fracture: No   COGNITION:  Overall cognitive status: Within functional limits for tasks assessed     SENSATION: Light touch: WFL  POSTURE: rounded shoulders, decreased lumbar lordosis, and anterior pelvic tilt  LUMBAR ROM:  all lumbar ROM exacerbates  pelvic discomfort and pressure; held further ROM screening of lumbar and LE due to pelvic pain  Cervical ROM:    Active  A/PROM  eval   Flexion WFL   Extension WFL   Right lateral flexion 75% HA sensation   Left lateral flexion 75% HA sensation   Right rotation 75%   Left rotation 75%    (Blank rows = not tested)  LOWER EXTREMITY MMT:    Active  Right eval Left eval Right / Left  Hip flexion 4-/5 p! 4/5 4-/5  4/5 p!  Hip extension     Hip abduction 4-/5 p! 4-/5 p! 4-/5  p!  Hip adduction 4-/5 p! 4-/5 p! 4-/5p!   (Blank rows = not tested)  Grip Strength:  R 15lbs; 15,15;  L 15lbs, 20, 20   FUNCTIONAL TESTS:  STS transfers: requires single UE for standing 11/7: 5X STS: 22.6  01/17/22:  5 x Sts 16.24  GAIT: Distance walked: 31f Assistive device utilized: None Level of assistance: Complete Independence Comments: toe out gait, decreased hip extension, hip hiking, decreased step length    TODAY'S   Objective testing: STS and kettle ball lift   Pt seen for aquatic therapy today.  Treatment took place in water 3.25-4.5 ft in depth at the [facility] Temp of water was 92.  Pt entered/exited the pool via stairs independently with bilat rail.   * unsupported walking forward, backward, side stepping * side step squat with BUE abdct/addct, cues and mirroring (complains of R hip pain afterwards) * straddling noodle: cycing; add/and and skiing * staggered stance with upright kick board push pull x 10 x 2 each LE forward * hands flat on kick board with trunk rotation for ROM, stretch *pelvic mobilization seated on noodle * 3 way kick; clam shell, * hip hinging * shoulder press with bilat yellow UE floats x 10 x 2, with cues and mirroring for improved posture/ form *Core engagement with blue short noodle press down for TrA engagment - moving like elevator on multiple floors    Pt requires the buoyancy and  hydrostatic pressure of water for support, and to offload joints by unweighting joint load by at least 50 % in navel deep water and by at least 75-80% in chest  to neck deep water.  Viscosity of the water is needed for resistance of strengthening. Water current perturbations provides challenge to standing balance requiring increased core activation.  PATIENT EDUCATION:  Education details: aquatic progressions  Person educated: Patient Education method: Explanation, Demonstration, Corporate treasurer cues, Verbal cues Education comprehension: verbalized understanding, returned demonstration, verbal cues required, and tactile cues required   HOME EXERCISE PROGRAM: Access Code: [email or web address] URL: [email or web address] Date: 10/31/2021 Prepared by: Daleen Bo  Aquatic:  Access Code: [phone] URL: [email or web address]  ASSESSMENT:  CLINICAL IMPRESSION:. Pt with improved STS score although has not yet reached goal.  She is ready for DC next visit preparing for planned surgical procedure.  She  is instructed today on proper use of pain scale using face depictions along with numbers so to properly level of pain going forward as I am not certain she has been properly assessing her level of pain up to this point.  She completes all tasks with verbal cuing.  I have laminated another copy of her HEP (3rd) with all the new exercises (as well as old) we have added as pt has access to pool and will continue on her own. Program is lengthy. We completed ~ 1/2 today and will finish next session along with dc assessment.     OBJECTIVE IMPAIRMENTS Abnormal gait, decreased activity tolerance, decreased balance, decreased coordination, decreased knowledge of condition, decreased mobility, difficulty walking, decreased ROM, decreased strength, hypomobility, increased muscle spasms, impaired flexibility, improper body mechanics, postural dysfunction, and pain.   ACTIVITY LIMITATIONS carrying, lifting, bending, sitting, squatting, sleeping, stairs, transfers, and locomotion level  PARTICIPATION LIMITATIONS: cleaning, interpersonal relationship, driving,  shopping, community activity, occupation, and exercise  PERSONAL FACTORS Age, Behavior pattern, Fitness, Time since onset of injury/illness/exacerbation, social, and 3+ comorbidities:  are also affecting patient's functional outcome.   REHAB POTENTIAL: Fair    CLINICAL DECISION MAKING: Evolving/moderate complexity  EVALUATION COMPLEXITY: Moderate   GOALS:   SHORT TERM GOALS: Target date: 12/12/2021  Pt will become independent with HEP in order to demonstrate synthesis of PT education. Goal status: MET  2.  Pt will report at least 2 pt reduction on NPRS scale for pain in order to demonstrate functional improvement with household activity, self care, and ADL.   Goal status: ongoing pain is variable  3.  Pt will demonstrate at least a 12.8 improvement in Modified Oswestry Index in order to demonstrate a clinically significant change in LBP and function.   Goal status: ongoing  4. Pt will demonstrate at least a 7.5 improvement in Neck Disability Index in order to demonstrate a clinically significant change in neck pain and function.   Goal status: INITIAL    LONG TERM GOALS: Target date: 01/23/2022  Pt  will become independent with final HEP in order to demonstrate synthesis of PT education.  Goal status: INITIAL  2.  Pt will be able to perform 5XSTS in under 12s  in order to demonstrate functional improvement above the cut off score for adults.   Goal status: improved but Not met  3.  Pt will be able to demonstrate/report ability to walk >10 mins with <3/10 pain pain in order to demonstrate functional improvement and tolerance to exercise and community mobility.   Goal status: INITIAL  4.  Pt will be able to lift//hold >15lbs in order to  demonstrate functional improvement in lumbopelvic strength for return to ADL and exercise.   Goal status: Achieved  5.  Pelvic floor goals to be set at time of (re)evaluation  Goal status: INITIAL   PLAN: PT FREQUENCY:  1-2x/week  PT DURATION: 12 weeks (likely D/C in 8 wks)  PLANNED INTERVENTIONS: Therapeutic exercises, Therapeutic activity, Neuromuscular re-education, Balance training, Gait training, Patient/Family education, Self Care, Joint mobilization, Joint manipulation, Stair training, Orthotic/Fit training, DME instructions, Aquatic Therapy, Dry Needling, Electrical stimulation, Spinal manipulation, Spinal mobilization, Cryotherapy, Moist heat, scar mobilization, Taping, Vasopneumatic device, Traction, Ultrasound, Biofeedback, Ionotophoresis 66m/ml Dexamethasone, Manual therapy, and Re-evaluation.  PLAN FOR NEXT SESSION:  aquatic therapy: gentle UE motion, light hip and lumbar mobility; chronic pain relief  MAnnamarie Major Adasha Boehme MPT 01/17/22 3:47 PM [facility] [address] [address]Phone: [phone]  Fax:  [phone] Addended MStanton Kidney(Tharon Aquas Lamees Gable MPT 01/19/22 806am

## 2022-01-18 DIAGNOSIS — N946 Dysmenorrhea, unspecified: Secondary | ICD-10-CM | POA: Diagnosis not present

## 2022-01-19 ENCOUNTER — Encounter (HOSPITAL_BASED_OUTPATIENT_CLINIC_OR_DEPARTMENT_OTHER): Payer: Self-pay | Admitting: Physical Therapy

## 2022-01-19 ENCOUNTER — Ambulatory Visit (HOSPITAL_BASED_OUTPATIENT_CLINIC_OR_DEPARTMENT_OTHER): Payer: Medicaid Other | Admitting: Physical Therapy

## 2022-01-19 DIAGNOSIS — M6281 Muscle weakness (generalized): Secondary | ICD-10-CM

## 2022-01-19 DIAGNOSIS — M5459 Other low back pain: Secondary | ICD-10-CM

## 2022-01-19 DIAGNOSIS — M62838 Other muscle spasm: Secondary | ICD-10-CM | POA: Diagnosis not present

## 2022-01-19 NOTE — Therapy (Signed)
OUTPATIENT PHYSICAL THERAPY THORACOLUMBAR TREATMENT   Patient Name: Meghan Carter MRN: [id number] DOB:1980-07-19, 41 y.o., female Today's Date: 01/19/2022   PT End of Session - 01/19/22 1819     Visit Number 16    Number of Visits 18    Date for PT Re-Evaluation 01/29/22    Authorization Type [employer]    PT Start Time 1400    PT Stop Time 1445    PT Time Calculation (min) 45 min    Activity Tolerance Patient tolerated treatment well    Behavior During Therapy WFL for tasks assessed/performed            PHYSICAL THERAPY DISCHARGE SUMMARY  Visits from Start of Care: 16  Current functional level related to goals / functional outcomes: Pt is as safe and indep with all functional mobility and ADL's as able   Remaining deficits: Chronic pain   Education / Equipment: Management of condition; HEP  Patient agrees to discharge. Patient goals were partially met. Patient is being discharged due to maximized rehab potential.      Past Medical History:  Diagnosis Date   Acute blood loss anemia 03/26/2015   Anemia    Anxiety    Atherosclerosis of arteries    Blood transfusion without reported diagnosis 2017   2 units, no reaction   Callus of foot 02/08/2011   Chronic kidney disease    Depression    Dysmenorrhea    Endometrial polyp 02/11/2014   Fecal occult blood test positive 01/27/2014   Fecal occult blood test positive 01/27/2014   Fibroid    High-tone pelvic floor dysfunction 03/17/2015   History of abnormal cervical Pap smear 02/11/2014   History of acute pancreatitis 03/31/2011   HSV (herpes simplex virus) infection    Hydrosalpinx    Bilateral   Internal hemorrhoids    Lichenification and lichen simplex chronicus 11/07/2010   Lower abdominal pain    Menorrhagia with regular cycle 01/27/2014   Menorrhagia with regular cycle 01/27/2014   Other fatigue    Pelvic pain in female 01/27/2014   Plantar fasciitis 02/08/2011   Prediabetes    Rectal bleeding  01/27/2014   Rectal pain 03/19/2014   Severe anemia    Tinnitus    Vaginal Pap smear, abnormal    Past Surgical History:  Procedure Laterality Date   BIOPSY  12/16/2021   Procedure: BIOPSY;  Surgeon: Eloise Harman, DO;  [facility];  Service: Endoscopy;;   CHROMOPERTUBATION  08/14/2017   Procedure: CHROMOPERTUBATION;  Surgeon: Governor Specking, MD;  Location: [facility];  Service: Gynecology;;   COLONOSCOPY N/A 03/23/2014   SLF: 1. The examined terminal ileum appeared to be normal. 2. The left colon is redundant 3. Moderate sized internal hemorrhoids   COLONOSCOPY WITH PROPOFOL N/A 12/16/2021   Procedure: COLONOSCOPY WITH PROPOFOL;  Surgeon: Eloise Harman, DO;  [facility];  Service: Endoscopy;  Laterality: N/A;  1:15pm, asa 3   ECTOPIC PREGNANCY SURGERY Left 2005   ESOPHAGOGASTRODUODENOSCOPY (EGD) WITH PROPOFOL N/A 12/16/2021   Procedure: ESOPHAGOGASTRODUODENOSCOPY (EGD) WITH PROPOFOL;  Surgeon: Eloise Harman, DO;  [facility];  Service: Endoscopy;  Laterality: N/A;   FLEXIBLE SIGMOIDOSCOPY N/A 04/01/2015   SLF: rectal bleeding due to large internal hemorrhoids 2. anemia due to rectal bleeding/memses/ poor iron intake.   FLEXIBLE SIGMOIDOSCOPY N/A 03/27/2015   Procedure: FLEXIBLE SIGMOIDOSCOPY;  Surgeon: Danie Binder, MD;  [facility];  Service: Endoscopy;  Laterality: N/A;   HEMORRHOID  BANDING N/A 03/23/2014   Procedure: HEMORRHOID BANDING;  Surgeon: Danie Binder, MD;  [facility];  Service: Endoscopy;  Laterality: N/A;   HEMORRHOID BANDING  03/27/2015   Procedure: HEMORRHOID BANDING;  Surgeon: Danie Binder, MD;  [facility];  Service: Endoscopy;;   HEMORRHOID SURGERY N/A 01/11/2018   Procedure: EXTENSIVE HEMORRHOIDECTOMY;  Surgeon: Aviva Signs, MD;  Location: [facility];  Service: General;  Laterality: N/A;   HYSTEROSCOPY WITH D & C N/A 06/16/2014   Procedure: DILATATION AND CURETTAGE /HYSTEROSCOPY;  Surgeon:  Jonnie Kind, MD;  Location: [facility];  Service: Gynecology;  Laterality: N/A;   LAPAROSCOPIC UNILATERAL SALPINGECTOMY Right 06/16/2014   Procedure: LAPAROSCOPIC UNILATERAL SALPINGECTOMY;  Surgeon: Jonnie Kind, MD;  [facility]: [facility];  Service: Gynecology;  Laterality: Right;   LAPAROSCOPY N/A 08/14/2017   Procedure: OPERATIVE LAPAROSCOPY ,LEFT SALPINGECTOMY, LYSIS OF ADHESIONS;  Surgeon: Governor Specking, MD;  Location[facility];  Service: Gynecology;  Laterality: N/A;   POLYPECTOMY N/A 06/16/2014   Procedure: POLYPECTOMY (endometrial);  Surgeon: Jonnie Kind, MD;  Location: [facility];  Service: Gynecology;  Laterality: N/A;   Patient Active Problem List   Diagnosis Date Noted   Lichen planus 43/88/8757   Spine pain, cervical 01/25/2021   Acute post-traumatic headache, not intractable 01/25/2021   Encounter for support and coordination of transition of care 01/25/2021   Leiomyoma 12/29/2020   Morbid obesity ([place]) 12/29/2020   Enlarged thyroid 06/29/2020   Constipation 06/29/2020   Annual physical exam 01/23/2019   Recurrent major depressive disorder, in partial remission ([place]) 01/14/2019   Depression, major, single episode, moderate ([place]) 11/19/2018   Anxiety 11/19/2018   Herpes simplex type 2 infection 01/24/2018   Internal and external bleeding hemorrhoids    History of abnormal cervical Pap smear 02/11/2014   Hemorrhoids, internal 01/27/2014   Nondependent alcohol abuse, episodic drinking behavior 03/31/2011    PCP: [facility], Trenton Gammon, NP  REFERRING PROVIDER: Carole Civil, MD  Porfirio Mylar, FNP (pelvic referral)  REFERRING DIAG:  M50.30 (ICD-10-CM) - DDD (degenerative disc disease), cervical  M51.37 (ICD-10-CM) - DDD (degenerative disc disease), lumbosacral  M54.50,G89.29 (ICD-10-CM) - Chronic midline low back pain, unspecified whether sciatica present   R10.2 (ICD-10-CM) - Pelvic and perineal pain  G89.29 (ICD-10-CM) - Other chronic pain  M54.50 (ICD-10-CM)  - Low back pain, unspecified  M25.551 (ICD-10-CM) - Pain in right hip  M25.552 (ICD-10-CM) - Pain in left hip   Rationale for Evaluation and Treatment Rehabilitation  THERAPY DIAG:  Other low back pain  Other muscle spasm  Muscle weakness (generalized)  ONSET DATE: 2023  SUBJECTIVE:  SUBJECTIVE STATEMENT: "Feeling ok, a little nervous about my surgery"  PERTINENT HISTORY:   February 07 2022 surgery- planned to be sooner; hysterectomy  3 abdominal surgeries, one for miscarriage in 2005, 2016 was cyst removal and salpingectomy Rt, Lt salpingectomy 2019 and some endometriosis.  PAIN:  Are you having pain? Yes:  NPRS scale: 6-7 Pain location:  generalized Pain description: sharp; dull; shooting Aggravating factors: standing, sitting, cooking, cleaning, groceries, stairs Relieving factors: resting, sitting   PRECAUTIONS: None  WEIGHT BEARING RESTRICTIONS No  FALLS:  Has patient fallen in last 6 months? No  LIVING ENVIRONMENT: Lives with: lives with their family Lives in: House/apartment Stairs: Yes Has following equipment at home: Single point cane  OCCUPATION: on leave from work at the moment; Producer, television/film/video; climbing ladders     PLOF: Independent with basic ADLs  PATIENT GOALS :    OBJECTIVE: (findings taken at evaluation unless otherwise noted)  DIAGNOSTIC FINDINGS:  IMPRESSION: No acute fracture or dislocation. Minimal degenerative joint changes of lower thoracic spine.     Lumbar X-ray IMPRESSION: Negative.  IMPRESSION: No acute fracture or dislocation. Mild degenerative joint changes of cervical spine.  PATIENT SURVEYS:  Modified Oswestry    NDI:   SCREENING FOR RED FLAGS: Bowel or bladder incontinence: Yes: history of urge  incontinence Spinal tumors: No Cauda equina syndrome: No Compression fracture: No   COGNITION:  Overall cognitive status: Within functional limits for tasks assessed     SENSATION: Light touch: WFL  POSTURE: rounded shoulders, decreased lumbar lordosis, and anterior pelvic tilt  LUMBAR ROM:  all lumbar ROM exacerbates  pelvic discomfort and pressure; held further ROM screening of lumbar and LE due to pelvic pain  Cervical ROM:   Active  A/PROM  eval   Flexion WFL   Extension WFL   Right lateral flexion 75% HA sensation   Left lateral flexion 75% HA sensation   Right rotation 75%   Left rotation 75%    (Blank rows = not tested)  LOWER EXTREMITY MMT:    Active  Right eval Left eval Right / Left  Hip flexion 4-/5 p! 4/5 4-/5  4/5 p!  Hip extension     Hip abduction 4-/5 p! 4-/5 p! 4-/5  p!  Hip adduction 4-/5 p! 4-/5 p! 4-/5p!   (Blank rows = not tested)  Grip Strength:  R 15lbs; 15,15;  L 15lbs, 20, 20   FUNCTIONAL TESTS:  STS transfers: requires single UE for standing 11/7: 5X STS: 22.6  01/17/22:  5 x Sts 16.24  GAIT: Distance walked: 58f Assistive device utilized: None Level of assistance: Complete Independence Comments: toe out gait, decreased hip extension, hip hiking, decreased step length    TODAY'S  Pt seen for aquatic therapy today.  Treatment took place in water 3.25-4.5 ft in depth at the [place] Temp of water was 92.  Pt entered/exited the pool via stairs independently with bilat rail.   * unsupported walking forward, backward, side stepping * side step squat with BUE abdct/addct, cues and mirroring (complains of R hip pain afterwards) * straddling noodle: cycing; add/and and skiing *grape vine with increasing speed *Cross midline rotation ue noodle opposite LE.   Pt requires the buoyancy and hydrostatic pressure of water for support, and to offload joints by unweighting joint load by at least 50 % in navel deep water  and by at least 75-80% in chest to neck deep water.  Viscosity of the water is needed for resistance of strengthening.  Water current perturbations provides challenge to standing balance requiring increased core activation.  Reviewed entire laminated HEP. Pt asks a few questions then VU and indep with program  PATIENT EDUCATION:  Education details: aquatic progressions  Person educated: Patient Education method: Explanation, Demonstration, Tactile cues, Verbal cues Education comprehension: verbalized understanding, returned demonstration, verbal cues required, and tactile cues required   HOME EXERCISE PROGRAM: Access Code: [email or web address] URL: [email or web address] Date: 10/31/2021 Prepared by: Daleen Bo  Aquatic:  Access Code: [email or web address] URL: [email or web address]  ASSESSMENT:  CLINICAL IMPRESSION:. Pt completed HEP as per laminated copy.  She verbalizes and demonstrates indep with. Pt unable to walk > just a few minutes.  She reports very high pain with attempted 10 minutes of amb. Overall she has had little pain reduction. She has reached her max potential at this point with aquatic therapy. Goals addressed.  DC summary complete. Pt DC'ed       OBJECTIVE IMPAIRMENTS Abnormal gait, decreased activity tolerance, decreased balance, decreased coordination, decreased knowledge of condition, decreased mobility, difficulty walking, decreased ROM, decreased strength, hypomobility, increased muscle spasms, impaired flexibility, improper body mechanics, postural dysfunction, and pain.   ACTIVITY LIMITATIONS carrying, lifting, bending, sitting, squatting, sleeping, stairs, transfers, and locomotion level  PARTICIPATION LIMITATIONS: cleaning, interpersonal relationship, driving, shopping, community activity, occupation, and exercise  PERSONAL FACTORS Age, Behavior pattern, Fitness, Time since onset of injury/illness/exacerbation, social, and 3+ comorbidities:  are also  affecting patient's functional outcome.   REHAB POTENTIAL: Fair    CLINICAL DECISION MAKING: Evolving/moderate complexity  EVALUATION COMPLEXITY: Moderate   GOALS:   SHORT TERM GOALS: Target date: 12/12/2021  Pt will become independent with HEP in order to demonstrate synthesis of PT education. Goal status: MET  2.  Pt will report at least 2 pt reduction on NPRS scale for pain in order to demonstrate functional improvement with household activity, self care, and ADL.   Goal status: Not met  3.  Pt will demonstrate at least a 12.8 improvement in Modified Oswestry Index in order to demonstrate a clinically significant change in LBP and function.   Goal status: pt declined completion  4. Pt will demonstrate at least a 7.5 improvement in Neck Disability Index in order to demonstrate a clinically significant change in neck pain and function.   Goal status: pt declined completion    LONG TERM GOALS: Target date: 01/23/2022  Pt  will become independent with final HEP in order to demonstrate synthesis of PT education.  Goal status: achieved 01/19/22  2.  Pt will be able to perform 5XSTS in under 12s  in order to demonstrate functional improvement above the cut off score for adults.   Goal status: improved but Not met  3.  Pt will be able to demonstrate/report ability to walk >10 mins with <3/10 pain pain in order to demonstrate functional improvement and tolerance to exercise and community mobility.   Goal status: Not met 01/19/22  4.  Pt will be able to lift//hold >15lbs in order to demonstrate functional improvement in lumbopelvic strength for return to ADL and exercise.   Goal status: Achieved  5.  Pelvic floor goals to be set at time of (re)evaluation  Goal status: Not completed.  Pt having hysterectomy   PLAN: PT FREQUENCY: 1-2x/week  PT DURATION: 12 weeks (likely D/C in 8 wks)  PLANNED INTERVENTIONS: Therapeutic exercises, Therapeutic activity, Neuromuscular  re-education, Balance training, Gait training, Patient/Family education, Self Care, Joint mobilization, Joint manipulation, Stair training, Orthotic/Fit training, DME instructions, Aquatic Therapy, Dry Needling, Electrical  stimulation, Spinal manipulation, Spinal mobilization, Cryotherapy, Moist heat, scar mobilization, Taping, Vasopneumatic device, Traction, Ultrasound, Biofeedback, Ionotophoresis 29m/ml Dexamethasone, Manual therapy, and Re-evaluation.  PLAN FOR NEXT SESSION:  aquatic therapy: gentle UE motion, light hip and lumbar mobility; chronic pain relief  MAnnamarie Major Zubayr Bednarczyk MPT 01/19/22 6:21 PM [facility] [address] [place] [address]Phone: [phone]  Fax:  [phone]

## 2022-02-06 DIAGNOSIS — Z419 Encounter for procedure for purposes other than remedying health state, unspecified: Secondary | ICD-10-CM | POA: Diagnosis not present

## 2022-02-07 DIAGNOSIS — Z885 Allergy status to narcotic agent status: Secondary | ICD-10-CM | POA: Diagnosis not present

## 2022-02-07 DIAGNOSIS — Z880 Allergy status to penicillin: Secondary | ICD-10-CM | POA: Diagnosis not present

## 2022-02-07 DIAGNOSIS — N939 Abnormal uterine and vaginal bleeding, unspecified: Secondary | ICD-10-CM | POA: Diagnosis not present

## 2022-02-07 DIAGNOSIS — E7800 Pure hypercholesterolemia, unspecified: Secondary | ICD-10-CM | POA: Diagnosis not present

## 2022-02-07 DIAGNOSIS — N946 Dysmenorrhea, unspecified: Secondary | ICD-10-CM | POA: Diagnosis not present

## 2022-02-07 DIAGNOSIS — R102 Pelvic and perineal pain: Secondary | ICD-10-CM | POA: Diagnosis not present

## 2022-02-07 DIAGNOSIS — Z886 Allergy status to analgesic agent status: Secondary | ICD-10-CM | POA: Diagnosis not present

## 2022-02-07 DIAGNOSIS — G8929 Other chronic pain: Secondary | ICD-10-CM | POA: Diagnosis not present

## 2022-02-08 DIAGNOSIS — Z885 Allergy status to narcotic agent status: Secondary | ICD-10-CM | POA: Diagnosis not present

## 2022-02-08 DIAGNOSIS — N939 Abnormal uterine and vaginal bleeding, unspecified: Secondary | ICD-10-CM | POA: Diagnosis not present

## 2022-02-08 DIAGNOSIS — Z880 Allergy status to penicillin: Secondary | ICD-10-CM | POA: Diagnosis not present

## 2022-02-08 DIAGNOSIS — Z886 Allergy status to analgesic agent status: Secondary | ICD-10-CM | POA: Diagnosis not present

## 2022-02-08 DIAGNOSIS — E7800 Pure hypercholesterolemia, unspecified: Secondary | ICD-10-CM | POA: Diagnosis not present

## 2022-02-08 DIAGNOSIS — N946 Dysmenorrhea, unspecified: Secondary | ICD-10-CM | POA: Diagnosis not present

## 2022-02-20 ENCOUNTER — Other Ambulatory Visit: Payer: Self-pay

## 2022-02-20 ENCOUNTER — Telehealth: Payer: Self-pay | Admitting: *Deleted

## 2022-02-20 MED ORDER — PANTOPRAZOLE SODIUM 40 MG PO TBEC: 40.0000 mg | DELAYED_RELEASE_TABLET | Freq: Every day | ORAL | 11 refills | Status: DC

## 2022-02-20 NOTE — Telephone Encounter (Signed)
Pt called and states she would like Pantoprazole sent to [facility] on [address] In [place].

## 2022-02-21 NOTE — Telephone Encounter (Signed)
Noted  

## 2022-03-06 ENCOUNTER — Telehealth: Payer: Self-pay

## 2022-03-06 NOTE — Telephone Encounter (Signed)
pt contacted clara gunn by phone on today to receive nurse advice regarding some concerning symptoms of discolored leakage, smelly odor and some pain at the site of her recent gynecological surgerical site near her "belly button"  Pt also was requesting advice on how to go about changing her appointment tine with her new PCP at [facility] to an earlier appt, in addition to and getting advice on how to obtain a temporary handicap decal that she reviously requested from new pcp [facility])  Plan Pt was assisted per her request by phone in obtaining her  -new pcp [facility]) appointment change witHh an earlier time than 5pm that was originally given to her.   Pt PCP next appot  was changed to (3.20.24 @ 10:30 am) -assisted pt with receiving clarification from [facility] provider's office about how to make a secondary request with her PCP regarding obtaining a temporary handicap decal -pt was able to contact her surgeon/ providers office at [facility] Invasive procedure gynecology clinic to leave a message with her provider's ([employer] claims handler) or nurse office to leave a message regarding her concerning symptoms (odor, pain, brown leakage at belly button area) so that she may be able to know how further care for the area or to obtain further care from the provider  Pt states she was satisfied of the results of  nurse advice she received and will attempt to wait for the followup call from the gynecological clinic advising her further

## 2022-03-09 DIAGNOSIS — Z6841 Body Mass Index (BMI) 40.0 and over, adult: Secondary | ICD-10-CM | POA: Diagnosis not present

## 2022-03-09 DIAGNOSIS — R109 Unspecified abdominal pain: Secondary | ICD-10-CM | POA: Diagnosis not present

## 2022-03-09 DIAGNOSIS — R21 Rash and other nonspecific skin eruption: Secondary | ICD-10-CM | POA: Diagnosis not present

## 2022-03-09 DIAGNOSIS — Z7689 Persons encountering health services in other specified circumstances: Secondary | ICD-10-CM | POA: Diagnosis not present

## 2022-03-09 DIAGNOSIS — Z419 Encounter for procedure for purposes other than remedying health state, unspecified: Secondary | ICD-10-CM | POA: Diagnosis not present

## 2022-03-09 DIAGNOSIS — K5900 Constipation, unspecified: Secondary | ICD-10-CM | POA: Diagnosis not present

## 2022-03-15 DIAGNOSIS — M6289 Other specified disorders of muscle: Secondary | ICD-10-CM | POA: Diagnosis not present

## 2022-03-15 DIAGNOSIS — R102 Pelvic and perineal pain: Secondary | ICD-10-CM | POA: Diagnosis not present

## 2022-03-15 DIAGNOSIS — G8929 Other chronic pain: Secondary | ICD-10-CM | POA: Diagnosis not present

## 2022-03-15 DIAGNOSIS — Z7689 Persons encountering health services in other specified circumstances: Secondary | ICD-10-CM | POA: Diagnosis not present

## 2022-03-15 DIAGNOSIS — N898 Other specified noninflammatory disorders of vagina: Secondary | ICD-10-CM | POA: Diagnosis not present

## 2022-03-17 ENCOUNTER — Encounter (INDEPENDENT_AMBULATORY_CARE_PROVIDER_SITE_OTHER): Payer: Self-pay
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
## 2022-03-20 ENCOUNTER — Telehealth: Payer: Medicaid Other | Admitting: Gastroenterology

## 2022-03-20 ENCOUNTER — Encounter: Payer: Self-pay | Admitting: Gastroenterology

## 2022-03-20 VITALS — Ht 63.0 in | Wt 225.0 lb

## 2022-03-20 DIAGNOSIS — R109 Unspecified abdominal pain: Secondary | ICD-10-CM | POA: Diagnosis not present

## 2022-03-20 DIAGNOSIS — K625 Hemorrhage of anus and rectum: Secondary | ICD-10-CM

## 2022-03-20 DIAGNOSIS — K5904 Chronic idiopathic constipation: Secondary | ICD-10-CM | POA: Diagnosis not present

## 2022-03-20 DIAGNOSIS — K219 Gastro-esophageal reflux disease without esophagitis: Secondary | ICD-10-CM

## 2022-03-20 MED ORDER — PANTOPRAZOLE SODIUM 20 MG PO TBEC: 20.0000 mg | DELAYED_RELEASE_TABLET | Freq: Every day | ORAL | 3 refills | Status: DC

## 2022-03-20 NOTE — Progress Notes (Unsigned)
Primary Care Physician:  Ameduite, Trenton Gammon, FNP (Inactive)  Primary Gastroenterologist: Elon Alas.Abbey Chatters, DO  Patient Location: Home Reason for Visit: discuss procedure findings and ongoing constipation  Persons present on the virtual encounter, with roles: Patient - Meghan Carter; Provider - Venetia Night, NP   Total time (minutes) spent on medical discussion: 67 minutes  Virtual Visit Encounter Note Visit is conducted virtually and was requested by patient.   I connected with Meghan Carter on 03/20/22 at  2:00 PM EST by video and verified that I am speaking with the correct person using two identifiers.   I discussed the limitations, risks, security and privacy concerns of performing an evaluation and management service by video and the availability of in person appointments. I also discussed with the patient that there may be a patient responsible charge related to this service. The patient expressed understanding and agreed to proceed.  Chief Complaint  Patient presents with   Follow-up    Questions. Still having issues with constipation.    History of Present Illness: Meghan Carter is a 42 y.o. female with a history of anemia, depression, dysmenorrhea, constipation, endometriosis/hysteroscopy and recent exploratory laparotomy with endometrial biopsy, HSV, hemorrhoids with occasional rectal bleeding s/p hemorrhoid banding presenting today for follow up of constipation, abdominal pain, GERD.    Last office visit 11/28/21. Reported allergic reaction to famotidine.  Currently taking mint Tums that will reflux, but not daily.  Still struggling with constipation, taking magnesium citrate and hypomagnesia.  Trying a daily smoothie that was not helping.  Denied eating any fried foods, has been baking meats.  Working on increasing fiber.  Also with complaint of mole/wound between her breast and putting of prescribed powder on this area.  Or pain in her ribs on both sides when  trying to lay on either side.  Also with numbness and tingling in her hand/wrist area.  Also noticed troubles with insurance and disability.  Was advised to continue Tums as needed.  May consider a longer trial famotidine if medication helps and monitor for worsening allergic symptoms.  Advise she could try Linzess 145 mcg daily.  Scheduled for EGD and colonoscopy for further evaluation of constipation, bloating, abdominal pain, and reflux/hiatal hernia.   Patient was offered samples of Linzess 145 mcg daily.  It appears that patient never came to pick up samples therefore they replaced back on the shelf to 12/26/2021.   EGD 12/16/21: -small hiatal hernia -variable Z line s/p biopsy -gastritis s/p biopsy -normal duodenum -Path: Stomach biopsy with mild reactive changes, negative for metaplasia or H. pylori.  GE junction biopsy with mild reflux changes, negative for any metaplasia, dysplasia, or malignancy -Advised pantoprazole 40 mg daily -Avoid NSAIDs   Colonoscopy 12/16/21: -sigmoid and ascending diverticulosis -TI normal -colon normal -hemorrhoid scars present -Repeat colonoscopy in 10 years   Since procedure patient called in 12/23/2021 and was given results for procedure.  Patient requested more information on reflux and ways to eliminate it without medication.  Did not immediately start taking pantoprazole due to fear of side effects.  She was advised to follow GERD food/diet plan and was provided with a paper handout in the mail.   Patient has previously expressed her concerns regarding mistreatment and dissatisfaction regarding procedures and her care.    Today:  Constipation: Having urination at the same time as her bowel movements, sometimes not every instance. Had surgery on 02/07/22 and has been doing lots of fasting and states she was  given clear lax afterwards and had many bowel movements. Bowels were moving while on that. After she ran out her constipation. States she was  recently started back on that. Feels like her intestines are pushing into her uterus or vice versa. Reports she is sick of that and reports her mental health is heavily affected by this. Reports she was referred to neuropsychiatry to help with pain and mental health issues. Has to do pelvic floor physical therapy to gain strength and then possibly get hysterectomy. Has follow up with GYN May 10. Sometimes still bleeding with bowel movements. When she gets constipated she feels like it sits in her hips and her lower back.   Just started clear lax last Wednesday and took second pack today and drinks it with a smoothie every day but has not had great strength. Reports she has frustrations regarding her GYN as well.   Reports she did pelvic floor PT for the majority of the year last year and had continued to have pain. States she did multiple aquatic PT appointments.   Patient states Dr. Abbey Chatters did not see her day of procedure while she was awake nor did he speak to her family members who brought her to her procedure.  Reports nurses stated he came in while she was asleep from sedation.   Threw up blood after procedure. States it was a lot (half a blue bag). This happened as soon as she woke up. Has never been nauseas after procedure. States she was never told that multiple pieces were taken. She reports her report states she had 7 little spots taken.   She states her colonoscopy pictures are questionable to her and her lawyers based on her pictures she received from her GYN exploratory surgery. She reports she knows the endometriosis and troubled things but states they look different. She feels like the inside pictures are not from the inside of her. She reports her GYN only took pictures of the outside of the her intestines she believes.   Reports she didn't get enough pictures or have any explanations of what was taken or pictures of where samples were taken from. Has frustrations about Dr. Abbey Chatters not  explaining things and feels as though he is hiding things. Reports she has asked many times for explanations.  States she feels neglected and not getting her results. Wants detailed pictures of where her samples were taken from. States she is wanting to proceed with going to the medical board for review and complaints and go into the legal system to resolve and receive answers for her pain.   States her previous procedures and surgeries have landed her in chronic pain and may not get hysterectomy and she continues to suffer.   Linzess helped with colonoscopy, would rather use something more natural however. Has concerns about being on clearlax long term.   Reports her vision has been worse since starting the pantoprazole and would like to come off or wean down because of this.  Still having some reflux symptoms. Did admit to eating things that could be bothersome for her during the super bowl.   Medications Current Meds  Medication Sig   clobetasol cream (TEMOVATE) [id number] % Apply 1 Application topically 2 (two) times a week.   hydrOXYzine (VISTARIL) 25 MG capsule Take 25 mg by mouth every 8 (eight) hours as needed for itching.   methocarbamol (ROBAXIN) 500 MG tablet Take by mouth.   Multiple Vitamins-Minerals (MULTIVITAMIN GUMMIES ADULT PO) Take 2 tablets by mouth  daily.   norethindrone (AYGESTIN) 5 MG tablet Take 5 mg by mouth daily.   nystatin (MYCOSTATIN/NYSTOP) powder Apply 1 Application topically 3 (three) times daily. (Patient taking differently: Apply 1 Application topically daily as needed (irritation).)   pantoprazole (PROTONIX) 40 MG tablet Take 1 tablet (40 mg total) by mouth daily.   traMADol (ULTRAM) 50 MG tablet Take by mouth.     History Past Medical History:  Diagnosis Date   Acute blood loss anemia 03/26/2015   Anemia    Anxiety    Atherosclerosis of arteries    Blood transfusion without reported diagnosis 2017   2 units, no reaction   Callus of foot 02/08/2011   Chronic  kidney disease    Depression    Dysmenorrhea    Endometrial polyp 02/11/2014   Fecal occult blood test positive 01/27/2014   Fecal occult blood test positive 01/27/2014   Fibroid    High-tone pelvic floor dysfunction 03/17/2015   History of abnormal cervical Pap smear 02/11/2014   History of acute pancreatitis 03/31/2011   HSV (herpes simplex virus) infection    Hydrosalpinx    Bilateral   Internal hemorrhoids    Lichenification and lichen simplex chronicus 11/07/2010   Lower abdominal pain    Menorrhagia with regular cycle 01/27/2014   Menorrhagia with regular cycle 01/27/2014   Other fatigue    Pelvic pain in female 01/27/2014   Plantar fasciitis 02/08/2011   Prediabetes    Rectal bleeding 01/27/2014   Rectal pain 03/19/2014   Severe anemia    Tinnitus    Vaginal Pap smear, abnormal     Past Surgical History:  Procedure Laterality Date   BIOPSY  12/16/2021   Procedure: BIOPSY;  Surgeon: Eloise Harman, DO;  [facility];  Service: Endoscopy;;   CHROMOPERTUBATION  08/14/2017   Procedure: CHROMOPERTUBATION;  Surgeon: Governor Specking, MD;  Location: [facility];  Service: Gynecology;;   COLONOSCOPY N/A 03/23/2014   SLF: 1. The examined terminal ileum appeared to be normal. 2. The left colon is redundant 3. Moderate sized internal hemorrhoids   COLONOSCOPY WITH PROPOFOL N/A 12/16/2021   Procedure: COLONOSCOPY WITH PROPOFOL;  Surgeon: Eloise Harman, DO;  [facility]: [facility];  Service: Endoscopy;  Laterality: N/A;  1:15pm, asa 3   ECTOPIC PREGNANCY SURGERY Left 2005   ESOPHAGOGASTRODUODENOSCOPY (EGD) WITH PROPOFOL N/A 12/16/2021   Procedure: ESOPHAGOGASTRODUODENOSCOPY (EGD) WITH PROPOFOL;  Surgeon: Eloise Harman, DO;  [facility];  Service: Endoscopy;  Laterality: N/A;   FLEXIBLE SIGMOIDOSCOPY N/A 04/01/2015   SLF: rectal bleeding due to large internal hemorrhoids 2. anemia due to rectal bleeding/memses/ poor iron intake.   FLEXIBLE SIGMOIDOSCOPY  N/A 03/27/2015   Procedure: FLEXIBLE SIGMOIDOSCOPY;  Surgeon: Danie Binder, MD;  [facility];  Service: Endoscopy;  Laterality: N/A;   HEMORRHOID BANDING N/A 03/23/2014   Procedure: HEMORRHOID BANDING;  Surgeon: Danie Binder, MD;  [facility];  Service: Endoscopy;  Laterality: N/A;   HEMORRHOID BANDING  03/27/2015   Procedure: HEMORRHOID BANDING;  Surgeon: Danie Binder, MD;  [facility];  Service: Endoscopy;;   HEMORRHOID SURGERY N/A 01/11/2018   Procedure: EXTENSIVE HEMORRHOIDECTOMY;  Surgeon: Aviva Signs, MD;  Location: [facility];  Service: General;  Laterality: N/A;   HYSTEROSCOPY WITH D & C N/A 06/16/2014   Procedure: DILATATION AND CURETTAGE /HYSTEROSCOPY;  Surgeon: Jonnie Kind, MD;  Location: [facility];  Service: Gynecology;  Laterality: N/A;   LAPAROSCOPIC UNILATERAL SALPINGECTOMY Right  06/16/2014   Procedure: LAPAROSCOPIC UNILATERAL SALPINGECTOMY;  Surgeon: Jonnie Kind, MD;  Location: [facility];  Service: Gynecology;  Laterality: Right;   LAPAROSCOPY N/A 08/14/2017   Procedure: OPERATIVE LAPAROSCOPY ,LEFT SALPINGECTOMY, LYSIS OF ADHESIONS;  Surgeon: Governor Specking, MD;  Location: [facility];  Service: Gynecology;  Laterality: N/A;   POLYPECTOMY N/A 06/16/2014   Procedure: POLYPECTOMY (endometrial);  Surgeon: Jonnie Kind, MD;  Location: [facility];  Service: Gynecology;  Laterality: N/A;    Family History  Problem Relation Age of Onset   Diabetes Father    Diabetes Paternal Grandmother    Diabetes Paternal Grandfather     Social History   Socioeconomic History   Marital status: Divorced    Spouse name: Not on file   Number of children: Not on file   Years of education: Not on file   Highest education level: Not on file  Occupational History   Not on file  Tobacco Use   Smoking status: Every Day    Packs/day: 0.25    Years: 27.00    Total pack years: 6.75    Types: Cigarettes    Passive exposure: Current   Smokeless tobacco: Never   Vaping Use   Vaping Use: Never used  Substance and Sexual Activity   Alcohol use: Not Currently   Drug use: No    Comment: quit 2021   Sexual activity: Not Currently    Partners: Male    Birth control/protection: None  Other Topics Concern   Not on file  Social History Narrative   Not on file   Social Determinants of Health   Financial Resource Strain: Not on file  Food Insecurity: Not on file  Transportation Needs: Not on file  Physical Activity: Not on file  Stress: Not on file  Social Connections: Not on file      Review of Systems: Gen: Denies fever, chills, anorexia. Denies fatigue, weakness, weight loss.  CV: Denies chest pain, palpitations, syncope, peripheral edema, and claudication. Resp: Denies dyspnea at rest, cough, wheezing, coughing up blood, and pleurisy. GI: see HPI Derm: Denies rash, itching, dry skin Psych: Denies depression, anxiety, memory loss, confusion. No homicidal or suicidal ideation.  Heme: Denies bruising, bleeding, and enlarged lymph nodes.  Observations/Objective: No distress. Alert and oriented. Pleasant. Well nourished. Normal mood and affect. Unable to perform complete physical exam due to video encounter.   Assessment:  Constipation: Documented reports of allergic reactions to senna, colace, and miralax (itching). Previous use of magnesium citrate and milk of magnesia and a daily smoothie to assist in better BMs however continues to struggle. Reported clearlax helped her after GYN procedure earlier this year and just recently started back taking this. Still struggles to strain at times and having associated bloating. Also reports having some fecal and urinary incontinence at times. Has followed with PT previously for pelvic floor therapy. Extensive time spend counseling patient on possible etiologies for constipation including lack of fiber, dehydration, idiopathic nature, and pelvic floor dysfunction. Advised patient to increase fiber in  her diet, ensure adequate hydration with at least 4-6 bottles of water daily, continue a packet of clearlax once daily and may increase to twice daily if needed. I have also recommended for her to follow through with pelvic floor PT as this may help with incontinence issues and constipation. We discussed that long term use of clearlax is safe and can be adjusted or discontinued for any side effects or adverse effects. Linzess worked well for colonoscopy prep however  she reports better results with clear lax therefore we will continue.   Abdominal pain: Continues to have abdominal pain and inability to sleep comfortably due to her pain. CT scan from August 2023 with negative exam, no acute findings. No evidence of hernia. Patient does have history of endometriosis who just underwent laparoscopic examination under anesthesia with GYN and had lysis of adhesions from right ovary. Etiology of pain unclear at this time however could be secondary to constipation as well as underlying GYN issues. Upper abdominal pain appears to have improved with PPI therapy. Per patients request I will discuss her colonoscopy findings with Dr. Abbey Chatters, particularly findings in the terminal ileum and reach out to see if there are any additional images that are stored from patients procedure that can be provided to her.   Rectal bleeding: Reports occasional bleeding with BM that continues to occur. Possibly secondary to scar tissue from prior hemorrhoids or possibility of new hemorrhoids. No evidence of active bleeding noted on recent colonoscopy.   GERD: EGD 12/16/21 with gastritis revealing mild reactive changes and variable Z line with biopsies revealing mild reactive changes. Upper abdominal pain and burning sensation has significantly improved since started on pantoprazole 40 mg once daily. However, since being on medication she reports her vision has worsened. We discussed weaning medication in order to help prevent rebound GERD.  I have advised to continue GERD diet, see AVS. I have sent in pantoprazole 20 mg once daily and will continue this for several weeks before considering coming off. May need to wean further after this with 20 mg every other day before stopping medication pending course of wean.   Plan:  Clearlax 1 packet once daily.  High fiber diet, may benefit from benefiber daily.  Adequate hydration  Continue with pelvic floor PT as recommended by GYN Talk with Dr. Abbey Chatters regarding findings in terminal ileum.  Wean pantoprazole to 20 mg once daily. Updated prescription sent to pharmacy.  GERD diet/ lifestyle modifications Follow up to be determined after discussion with Dr. Abbey Chatters.  Will reach out to see if there are additional images available from patients procedure that is available for the patient to request.    Follow Up Instructions:  I discussed the assessment and treatment plan with the patient. The patient was provided an opportunity to ask questions and all were answered. The patient agreed with the plan and demonstrated an understanding of the instructions.   The patient was advised to call back or seek an in-person evaluation if the symptoms worsen or if the condition fails to improve as anticipated.   I spent 67 minutes face to face with patient, < 50 % of my time during this visit providing education, discussing results, and relaying plan to the patient.   Venetia Night, MSN, APRN, FNP-BC, AGACNP-BC [facility]

## 2022-03-21 NOTE — Patient Instructions (Addendum)
For constipation: -Ensure adequate hydration, 4-6 bottles water daily and monitoring for light straw colored urine and moist mucous membranes (moist oral mucosa and nasal mucosa).  -Increase fiber in your diet, high fiber diet handout attached.  -May benefit from fiber supplement such as Benefiber 2-3 teaspoons daily mixed in 8 oc of fluid of your choice.  -Continue with pelvic floor PT as recommended by PT  For your reflux/ previous upper abdominal pain: -I have updated your prescription for pantoprazole.  You will take 20 mg once daily, 30 minutes prior to breakfast. Follow a GERD diet:  Avoid fried, fatty, greasy, spicy, citrus foods. Avoid caffeine and carbonated beverages. Avoid chocolate. Try eating 4-6 small meals a day rather than 3 large meals. Do not eat within 3 hours of laying down. Prop head of bed up on wood or bricks to create a 6 inch incline. (This information should be included in previous handout that was sent to you).   I will discuss your colonoscopy findings from the terminal ileum with Dr. Abbey Chatters and our office will be in touch regarding these findings as soon as discussion/conversation occurs.  Will also reach out to Dr. Abbey Chatters and/or endoscopy at [facility] to see if there is a database with additional images from your colonoscopy that you have requested.  There may not be additional images but I will ask.   It was a pleasure to see you today. I want to create trusting relationships with patients. If you receive a survey regarding your visit,  I greatly appreciate you taking time to fill this out on paper or through your MyChart. I value your feedback.  Venetia Night, MSN, FNP-BC, AGACNP-BC [facility]

## 2022-03-23 ENCOUNTER — Ambulatory Visit: Payer: Medicaid Other | Attending: Obstetrics and Gynecology

## 2022-03-23 ENCOUNTER — Other Ambulatory Visit: Payer: Self-pay

## 2022-03-23 DIAGNOSIS — M5459 Other low back pain: Secondary | ICD-10-CM | POA: Insufficient documentation

## 2022-03-23 DIAGNOSIS — M62838 Other muscle spasm: Secondary | ICD-10-CM | POA: Diagnosis not present

## 2022-03-23 DIAGNOSIS — R102 Pelvic and perineal pain: Secondary | ICD-10-CM | POA: Diagnosis not present

## 2022-03-23 DIAGNOSIS — R279 Unspecified lack of coordination: Secondary | ICD-10-CM | POA: Insufficient documentation

## 2022-03-23 DIAGNOSIS — R293 Abnormal posture: Secondary | ICD-10-CM | POA: Insufficient documentation

## 2022-03-23 DIAGNOSIS — M6281 Muscle weakness (generalized): Secondary | ICD-10-CM | POA: Diagnosis not present

## 2022-03-23 NOTE — Patient Instructions (Signed)
Squatty potty: When your knees are level or below the level of your hips, pelvic floor muscles are pressed against rectum, preventing ease of bowel movement. By getting knees above the level of the hips, these pelvic floor muscles relax, allowing easier passage of bowel movement. ? Ways to get knees above hips: o Squatty Potty (7inch and 9inch versions) o Small stool o Roll of toilet paper under each foot o Hardback book or stack of magazines under each foot  Relaxed Toileting mechanics: Once in this position, make sure to lean forward with forearms on thighs, wide knees, relaxed stomach, and breathe.    Bowel massage: To assist with more regular and more comfortable bowel movements, try performing bowel massage nightly for 5-10 minutes. Place hands in the lower right side of your abdomen to start; in small circles, massage up, across, and down the left side of your abdomen. Pressure does not need to be hard, but just comfortable. You can use lotion or oil to make more comfortable.
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
## 2022-04-05 ENCOUNTER — Ambulatory Visit: Payer: Medicaid Other

## 2022-04-05 DIAGNOSIS — R279 Unspecified lack of coordination: Secondary | ICD-10-CM | POA: Diagnosis not present

## 2022-04-05 DIAGNOSIS — M6281 Muscle weakness (generalized): Secondary | ICD-10-CM | POA: Diagnosis not present

## 2022-04-05 DIAGNOSIS — M5459 Other low back pain: Secondary | ICD-10-CM | POA: Diagnosis not present

## 2022-04-05 DIAGNOSIS — M62838 Other muscle spasm: Secondary | ICD-10-CM | POA: Diagnosis not present

## 2022-04-05 DIAGNOSIS — R102 Pelvic and perineal pain: Secondary | ICD-10-CM

## 2022-04-05 DIAGNOSIS — R293 Abnormal posture: Secondary | ICD-10-CM | POA: Diagnosis not present
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
## 2022-04-06 ENCOUNTER — Encounter: Payer: Self-pay | Admitting: Radiology

## 2022-04-07 DIAGNOSIS — Z419 Encounter for procedure for purposes other than remedying health state, unspecified: Secondary | ICD-10-CM | POA: Diagnosis not present

## 2022-04-10 DIAGNOSIS — E669 Obesity, unspecified: Secondary | ICD-10-CM | POA: Diagnosis not present

## 2022-04-10 DIAGNOSIS — F418 Other specified anxiety disorders: Secondary | ICD-10-CM | POA: Diagnosis not present

## 2022-04-11 ENCOUNTER — Ambulatory Visit: Payer: Medicaid Other | Attending: Obstetrics and Gynecology

## 2022-04-11 DIAGNOSIS — R279 Unspecified lack of coordination: Secondary | ICD-10-CM | POA: Diagnosis not present

## 2022-04-11 DIAGNOSIS — R102 Pelvic and perineal pain unspecified side: Secondary | ICD-10-CM

## 2022-04-11 DIAGNOSIS — M62838 Other muscle spasm: Secondary | ICD-10-CM

## 2022-04-11 DIAGNOSIS — R293 Abnormal posture: Secondary | ICD-10-CM | POA: Diagnosis not present

## 2022-04-11 DIAGNOSIS — M6281 Muscle weakness (generalized): Secondary | ICD-10-CM | POA: Diagnosis not present

## 2022-04-11 DIAGNOSIS — M5459 Other low back pain: Secondary | ICD-10-CM
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
## 2022-05-02 ENCOUNTER — Ambulatory Visit: Payer: Medicaid Other

## 2022-05-02 DIAGNOSIS — M5459 Other low back pain: Secondary | ICD-10-CM

## 2022-05-02 DIAGNOSIS — M6281 Muscle weakness (generalized): Secondary | ICD-10-CM

## 2022-05-02 DIAGNOSIS — M62838 Other muscle spasm: Secondary | ICD-10-CM

## 2022-05-02 DIAGNOSIS — R102 Pelvic and perineal pain: Secondary | ICD-10-CM

## 2022-05-02 DIAGNOSIS — R279 Unspecified lack of coordination: Secondary | ICD-10-CM

## 2022-05-02 DIAGNOSIS — R293 Abnormal posture: Secondary | ICD-10-CM
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
## 2022-05-11 ENCOUNTER — Ambulatory Visit: Payer: Medicaid Other | Attending: Obstetrics and Gynecology

## 2022-05-11 DIAGNOSIS — R102 Pelvic and perineal pain: Secondary | ICD-10-CM | POA: Diagnosis present

## 2022-05-11 DIAGNOSIS — R279 Unspecified lack of coordination: Secondary | ICD-10-CM | POA: Insufficient documentation

## 2022-05-11 DIAGNOSIS — M5459 Other low back pain: Secondary | ICD-10-CM | POA: Diagnosis not present

## 2022-05-11 DIAGNOSIS — M62838 Other muscle spasm: Secondary | ICD-10-CM | POA: Insufficient documentation

## 2022-05-11 DIAGNOSIS — R293 Abnormal posture: Secondary | ICD-10-CM | POA: Diagnosis present

## 2022-05-11 DIAGNOSIS — M6281 Muscle weakness (generalized): Secondary | ICD-10-CM | POA: Diagnosis present
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
## 2022-05-17 ENCOUNTER — Ambulatory Visit: Payer: Medicaid Other

## 2022-05-17 DIAGNOSIS — M5459 Other low back pain: Secondary | ICD-10-CM | POA: Diagnosis not present

## 2022-05-17 DIAGNOSIS — R293 Abnormal posture: Secondary | ICD-10-CM

## 2022-05-17 DIAGNOSIS — M6281 Muscle weakness (generalized): Secondary | ICD-10-CM

## 2022-05-17 DIAGNOSIS — M62838 Other muscle spasm: Secondary | ICD-10-CM

## 2022-05-17 DIAGNOSIS — R279 Unspecified lack of coordination: Secondary | ICD-10-CM

## 2022-05-17 DIAGNOSIS — R102 Pelvic and perineal pain: Secondary | ICD-10-CM
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
## 2022-05-24 ENCOUNTER — Ambulatory Visit: Payer: Medicaid Other

## 2022-05-24 DIAGNOSIS — R279 Unspecified lack of coordination: Secondary | ICD-10-CM

## 2022-05-24 DIAGNOSIS — M5459 Other low back pain: Secondary | ICD-10-CM

## 2022-05-24 DIAGNOSIS — R293 Abnormal posture: Secondary | ICD-10-CM

## 2022-05-24 DIAGNOSIS — M6281 Muscle weakness (generalized): Secondary | ICD-10-CM

## 2022-05-24 DIAGNOSIS — M62838 Other muscle spasm: Secondary | ICD-10-CM

## 2022-05-24 DIAGNOSIS — R102 Pelvic and perineal pain: Secondary | ICD-10-CM
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
## 2022-05-31 ENCOUNTER — Ambulatory Visit: Payer: Medicaid Other

## 2022-06-05 ENCOUNTER — Ambulatory Visit: Payer: Medicaid Other

## 2022-06-05 DIAGNOSIS — R293 Abnormal posture: Secondary | ICD-10-CM

## 2022-06-05 DIAGNOSIS — R279 Unspecified lack of coordination: Secondary | ICD-10-CM

## 2022-06-05 DIAGNOSIS — M62838 Other muscle spasm: Secondary | ICD-10-CM

## 2022-06-05 DIAGNOSIS — R102 Pelvic and perineal pain: Secondary | ICD-10-CM

## 2022-06-05 DIAGNOSIS — M5459 Other low back pain: Secondary | ICD-10-CM | POA: Diagnosis not present

## 2022-06-05 DIAGNOSIS — M6281 Muscle weakness (generalized): Secondary | ICD-10-CM
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
## 2022-06-14 ENCOUNTER — Ambulatory Visit: Payer: Medicaid Other | Attending: Obstetrics and Gynecology

## 2022-06-14 DIAGNOSIS — R293 Abnormal posture: Secondary | ICD-10-CM | POA: Diagnosis present

## 2022-06-14 DIAGNOSIS — R102 Pelvic and perineal pain: Secondary | ICD-10-CM

## 2022-06-14 DIAGNOSIS — M542 Cervicalgia: Secondary | ICD-10-CM | POA: Insufficient documentation

## 2022-06-14 DIAGNOSIS — M6281 Muscle weakness (generalized): Secondary | ICD-10-CM

## 2022-06-14 DIAGNOSIS — M62838 Other muscle spasm: Secondary | ICD-10-CM | POA: Diagnosis present

## 2022-06-14 DIAGNOSIS — R279 Unspecified lack of coordination: Secondary | ICD-10-CM | POA: Diagnosis present

## 2022-06-14 DIAGNOSIS — M5459 Other low back pain: Secondary | ICD-10-CM
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
## 2022-06-14 NOTE — Patient Instructions (Addendum)
Squatty potty - be consistent! Lean back while trying to have bowel movement Splinting - take a small wad of toilet paper and apply pressure between the anus and the vagina (think up and back); you're not actually putting anything in the vagina    Bladder/bowel retraining:  8-12 grams of fiber with each meal After each meal, go attempt to have a bowel movement within 5 minutes and don't sit on the toilet for more than 5 minutes Drink 4-8oz an hour ONLY WATER Stop water intake 3 hours before bed Try to go at least 2-3 hours between trips to the bathroom - go whether you need to or not.  For two weeks.     Urge Incontinence  Ideal urination frequency is every 2-4 wakeful hours, which equates to 5-8 times within a 24-hour period.   Urge incontinence is leakage that occurs when the bladder muscle contracts, creating a sudden need to go before getting to the bathroom.   Going too often when your bladder isn't actually full can disrupt the body's automatic signals to store and hold urine longer, which will increase urgency/frequency.  In this case, the bladder "is running the show" and strategies can be learned to retrain this pattern.   One should be able to control the first urge to urinate, at around .  The bladder can hold up to a "grande latte," or . To help you gain control, practice the Urge Drill below when urgency strikes.  This drill will help retrain your bladder signals and allow you to store and hold urine longer.  The overall goal is to stretch out your time between voids to reach a more manageable voiding schedule.    Practice your "quick flicks" often throughout the day (each waking hour) even when you don't need feel the urge to go.  This will help strengthen your pelvic floor muscles, making them more effective in controlling leakage.  Urge Drill  When you feel an urge to go, follow these steps to regain control: Stop what you are doing and be still Take one  deep breath, directing your air into your abdomen Think an affirming thought, such as "I've got this." Do 5 quick flicks of your pelvic floor Walk with control to the bathroom to void, or delay voiding    Sleep hygiene: try not to nap! Exercise will help you get through the sleepy times. Once you are sleeping better through the night, you won't need to take naps during the day :)   [facility] [address] [place], [place] [address] Phone # [phone] Fax [phone]

## 2022-06-21 ENCOUNTER — Ambulatory Visit: Payer: Medicaid Other

## 2022-06-21 DIAGNOSIS — M6281 Muscle weakness (generalized): Secondary | ICD-10-CM

## 2022-06-21 DIAGNOSIS — R102 Pelvic and perineal pain: Secondary | ICD-10-CM

## 2022-06-21 DIAGNOSIS — R279 Unspecified lack of coordination: Secondary | ICD-10-CM

## 2022-06-21 DIAGNOSIS — M5459 Other low back pain: Secondary | ICD-10-CM | POA: Diagnosis not present

## 2022-06-21 DIAGNOSIS — R293 Abnormal posture: Secondary | ICD-10-CM

## 2022-06-21 DIAGNOSIS — M62838 Other muscle spasm: Secondary | ICD-10-CM
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
## 2022-06-28 ENCOUNTER — Ambulatory Visit: Payer: Medicaid Other

## 2022-06-28 DIAGNOSIS — M62838 Other muscle spasm: Secondary | ICD-10-CM

## 2022-06-28 DIAGNOSIS — R293 Abnormal posture: Secondary | ICD-10-CM

## 2022-06-28 DIAGNOSIS — R102 Pelvic and perineal pain: Secondary | ICD-10-CM

## 2022-06-28 DIAGNOSIS — R279 Unspecified lack of coordination: Secondary | ICD-10-CM

## 2022-06-28 DIAGNOSIS — M5459 Other low back pain: Secondary | ICD-10-CM | POA: Diagnosis not present

## 2022-06-28 DIAGNOSIS — M6281 Muscle weakness (generalized): Secondary | ICD-10-CM
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
## 2022-07-05 ENCOUNTER — Ambulatory Visit: Payer: Medicaid Other

## 2022-07-05 DIAGNOSIS — M5459 Other low back pain: Secondary | ICD-10-CM

## 2022-07-05 DIAGNOSIS — M6281 Muscle weakness (generalized): Secondary | ICD-10-CM

## 2022-07-05 DIAGNOSIS — R279 Unspecified lack of coordination: Secondary | ICD-10-CM

## 2022-07-05 DIAGNOSIS — R102 Pelvic and perineal pain unspecified side: Secondary | ICD-10-CM

## 2022-07-05 DIAGNOSIS — M62838 Other muscle spasm: Secondary | ICD-10-CM

## 2022-07-05 DIAGNOSIS — R293 Abnormal posture: Secondary | ICD-10-CM

## 2022-07-05 DIAGNOSIS — M542 Cervicalgia: Secondary | ICD-10-CM
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
## 2022-07-10 ENCOUNTER — Other Ambulatory Visit: Payer: Self-pay | Admitting: Gastroenterology

## 2022-07-10 MED ORDER — PANTOPRAZOLE SODIUM 20 MG PO TBEC: 20.0000 mg | DELAYED_RELEASE_TABLET | Freq: Every day | ORAL | 11 refills | Status: DC

## 2022-07-13 ENCOUNTER — Ambulatory Visit: Payer: Medicaid Other | Attending: Obstetrics and Gynecology

## 2022-07-13 DIAGNOSIS — M62838 Other muscle spasm: Secondary | ICD-10-CM | POA: Insufficient documentation

## 2022-07-13 DIAGNOSIS — R293 Abnormal posture: Secondary | ICD-10-CM | POA: Insufficient documentation

## 2022-07-13 DIAGNOSIS — R279 Unspecified lack of coordination: Secondary | ICD-10-CM | POA: Diagnosis present

## 2022-07-13 DIAGNOSIS — R102 Pelvic and perineal pain: Secondary | ICD-10-CM | POA: Insufficient documentation

## 2022-07-13 DIAGNOSIS — M5459 Other low back pain: Secondary | ICD-10-CM | POA: Diagnosis present

## 2022-07-13 DIAGNOSIS — M6281 Muscle weakness (generalized): Secondary | ICD-10-CM | POA: Insufficient documentation
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
## 2022-07-18 ENCOUNTER — Ambulatory Visit: Payer: Medicaid Other

## 2022-07-19 ENCOUNTER — Ambulatory Visit: Payer: Medicaid Other

## 2022-07-19 DIAGNOSIS — R293 Abnormal posture: Secondary | ICD-10-CM

## 2022-07-19 DIAGNOSIS — M6281 Muscle weakness (generalized): Secondary | ICD-10-CM

## 2022-07-19 DIAGNOSIS — R279 Unspecified lack of coordination: Secondary | ICD-10-CM

## 2022-07-19 DIAGNOSIS — M62838 Other muscle spasm: Secondary | ICD-10-CM

## 2022-07-19 DIAGNOSIS — M5459 Other low back pain: Secondary | ICD-10-CM

## 2022-07-19 DIAGNOSIS — R102 Pelvic and perineal pain: Secondary | ICD-10-CM
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
## 2022-07-25 ENCOUNTER — Ambulatory Visit: Payer: Medicaid Other

## 2022-07-25 DIAGNOSIS — R279 Unspecified lack of coordination: Secondary | ICD-10-CM

## 2022-07-25 DIAGNOSIS — M5459 Other low back pain: Secondary | ICD-10-CM | POA: Diagnosis not present

## 2022-07-25 DIAGNOSIS — R293 Abnormal posture: Secondary | ICD-10-CM

## 2022-07-25 DIAGNOSIS — M62838 Other muscle spasm: Secondary | ICD-10-CM

## 2022-07-25 DIAGNOSIS — R102 Pelvic and perineal pain: Secondary | ICD-10-CM

## 2022-07-25 DIAGNOSIS — M6281 Muscle weakness (generalized): Secondary | ICD-10-CM
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
## 2022-08-02 ENCOUNTER — Ambulatory Visit: Payer: Medicaid Other

## 2022-08-02 DIAGNOSIS — R102 Pelvic and perineal pain: Secondary | ICD-10-CM

## 2022-08-02 DIAGNOSIS — R293 Abnormal posture: Secondary | ICD-10-CM

## 2022-08-02 DIAGNOSIS — M5459 Other low back pain: Secondary | ICD-10-CM | POA: Diagnosis not present

## 2022-08-02 DIAGNOSIS — M6281 Muscle weakness (generalized): Secondary | ICD-10-CM

## 2022-08-02 DIAGNOSIS — M62838 Other muscle spasm: Secondary | ICD-10-CM

## 2022-08-02 DIAGNOSIS — R279 Unspecified lack of coordination: Secondary | ICD-10-CM
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
## 2022-08-15 ENCOUNTER — Ambulatory Visit: Payer: Medicaid Other | Attending: Obstetrics and Gynecology

## 2022-08-15 DIAGNOSIS — R279 Unspecified lack of coordination: Secondary | ICD-10-CM | POA: Diagnosis present

## 2022-08-15 DIAGNOSIS — M62838 Other muscle spasm: Secondary | ICD-10-CM | POA: Insufficient documentation

## 2022-08-15 DIAGNOSIS — M6281 Muscle weakness (generalized): Secondary | ICD-10-CM | POA: Diagnosis present

## 2022-08-15 DIAGNOSIS — M5459 Other low back pain: Secondary | ICD-10-CM | POA: Insufficient documentation

## 2022-08-15 DIAGNOSIS — R293 Abnormal posture: Secondary | ICD-10-CM | POA: Insufficient documentation

## 2022-08-15 DIAGNOSIS — R102 Pelvic and perineal pain: Secondary | ICD-10-CM | POA: Diagnosis present

## 2022-08-15 NOTE — Patient Instructions (Signed)
Mindfulness: A common source of pelvic floor muscle tension is stress and anxiety. Mindfulness training has been shown to be beneficial in reducing stress and anxiety by increasing bodily awareness and learning to relax unwanted tension. A good starting mindfulness practice is by Justine Null; follow instructions below to access free recorded sessions: Type Justine Null into browser, go to website and click guided meditation, listen now, pick "progressive relaxation" or "relaxation body scan" If you do not like these sessions, try typing the above titles into YouTube for hundreds of options! A great YouTube option is [employer]: 10 minute body scan Mindfulness is a very Stage manager; therefore, it can take time and trial and error in order to find what is most beneficial for you. There are also apps you can pay to use: Head[employer] Calm  **Type this into youtube: Yogini [place] - body scan 10 minutes    [facility] [address], [address] [place], [place] [address] Phone # [phone] Fax [phone]
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
## 2022-08-22 ENCOUNTER — Ambulatory Visit: Payer: Medicaid Other

## 2022-08-22 DIAGNOSIS — M5459 Other low back pain: Secondary | ICD-10-CM | POA: Diagnosis not present

## 2022-08-22 DIAGNOSIS — M6281 Muscle weakness (generalized): Secondary | ICD-10-CM

## 2022-08-22 DIAGNOSIS — R102 Pelvic and perineal pain: Secondary | ICD-10-CM

## 2022-08-22 DIAGNOSIS — M62838 Other muscle spasm: Secondary | ICD-10-CM

## 2022-08-22 DIAGNOSIS — R293 Abnormal posture: Secondary | ICD-10-CM

## 2022-08-22 DIAGNOSIS — R279 Unspecified lack of coordination: Secondary | ICD-10-CM
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
## 2022-09-11 ENCOUNTER — Other Ambulatory Visit: Payer: Self-pay | Admitting: Obstetrics and Gynecology

## 2022-09-11 DIAGNOSIS — Z1231 Encounter for screening mammogram for malignant neoplasm of breast: Secondary | ICD-10-CM

## 2022-09-14 ENCOUNTER — Ambulatory Visit: Payer: Medicaid Other

## 2022-09-18 ENCOUNTER — Ambulatory Visit: Payer: Medicaid Other

## 2022-09-27 ENCOUNTER — Ambulatory Visit
Admission: RE | Admit: 2022-09-27 | Discharge: 2022-09-27 | Disposition: A | Discharge status: Home / Self Care | Payer: Medicaid Other | Source: Ambulatory Visit | Attending: Obstetrics and Gynecology | Admitting: Obstetrics and Gynecology

## 2022-09-27 DIAGNOSIS — Z1231 Encounter for screening mammogram for malignant neoplasm of breast: Secondary | ICD-10-CM

## 2022-10-02 ENCOUNTER — Ambulatory Visit: Payer: Medicaid Other | Admitting: Family Medicine

## 2023-01-18 ENCOUNTER — Other Ambulatory Visit: Payer: Self-pay | Admitting: Gastroenterology

## 2023-01-18 MED ORDER — PANTOPRAZOLE SODIUM 40 MG PO TBEC: 40.0000 mg | DELAYED_RELEASE_TABLET | Freq: Every day | ORAL | 3 refills | Status: AC

## 2024-01-02 ENCOUNTER — Telehealth: Payer: Self-pay | Admitting: Gastroenterology

## 2024-01-02 NOTE — Telephone Encounter (Signed)
 Error

## 2024-04-23 ENCOUNTER — Ambulatory Visit: Admitting: Allergy
